# Patient Record
Sex: Male | Born: 1940 | Race: White | Hispanic: No | Marital: Married | Smoking: Former smoker
Health system: Southern US, Community
[De-identification: ages and names within clinical notes are randomized; demographics above are authoritative.]

## PROBLEM LIST (undated history)

## (undated) DIAGNOSIS — E785 Hyperlipidemia, unspecified: Secondary | ICD-10-CM

## (undated) DIAGNOSIS — G479 Sleep disorder, unspecified: Secondary | ICD-10-CM

## (undated) DIAGNOSIS — M1990 Unspecified osteoarthritis, unspecified site: Secondary | ICD-10-CM

## (undated) DIAGNOSIS — C7B8 Other secondary neuroendocrine tumors: Secondary | ICD-10-CM

## (undated) DIAGNOSIS — I10 Essential (primary) hypertension: Secondary | ICD-10-CM

## (undated) DIAGNOSIS — N508 Other specified disorders of male genital organs: Secondary | ICD-10-CM

## (undated) DIAGNOSIS — I519 Heart disease, unspecified: Secondary | ICD-10-CM

## (undated) DIAGNOSIS — F419 Anxiety disorder, unspecified: Secondary | ICD-10-CM

## (undated) DIAGNOSIS — E039 Hypothyroidism, unspecified: Secondary | ICD-10-CM

## (undated) DIAGNOSIS — I499 Cardiac arrhythmia, unspecified: Secondary | ICD-10-CM

## (undated) DIAGNOSIS — K219 Gastro-esophageal reflux disease without esophagitis: Secondary | ICD-10-CM

## (undated) DIAGNOSIS — D126 Benign neoplasm of colon, unspecified: Secondary | ICD-10-CM

## (undated) DIAGNOSIS — I509 Heart failure, unspecified: Secondary | ICD-10-CM

## (undated) DIAGNOSIS — E079 Disorder of thyroid, unspecified: Secondary | ICD-10-CM

## (undated) DIAGNOSIS — C801 Malignant (primary) neoplasm, unspecified: Secondary | ICD-10-CM

## (undated) HISTORY — DX: Other specified disorders of male genital organs: N50.8

## (undated) HISTORY — DX: Heart failure, unspecified: I50.9

## (undated) HISTORY — DX: Sleep disorder, unspecified: G47.9

## (undated) HISTORY — DX: Other secondary neuroendocrine tumors: C7B.8

## (undated) HISTORY — DX: Disorder of thyroid, unspecified: E07.9

## (undated) HISTORY — DX: Anxiety disorder, unspecified: F41.9

## (undated) HISTORY — DX: Heart disease, unspecified: I51.9

## (undated) HISTORY — DX: Gastro-esophageal reflux disease without esophagitis: K21.9

## (undated) HISTORY — DX: Hypothyroidism, unspecified: E03.9

## (undated) HISTORY — DX: Unspecified osteoarthritis, unspecified site: M19.90

## (undated) HISTORY — DX: Hyperlipidemia, unspecified: E78.5

## (undated) HISTORY — DX: Cardiac arrhythmia, unspecified: I49.9

## (undated) HISTORY — DX: Essential (primary) hypertension: I10

## (undated) HISTORY — DX: Benign neoplasm of colon, unspecified: D12.6

## (undated) HISTORY — PX: WRIST SURGERY: SHX841

---
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
## 1995-07-26 DIAGNOSIS — E785 Hyperlipidemia, unspecified: Secondary | ICD-10-CM

## 1995-07-26 HISTORY — DX: Hyperlipidemia, unspecified: E78.5

## 1995-07-26 HISTORY — PX: SPINE SURGERY: SHX786

## 2002-07-25 DIAGNOSIS — D126 Benign neoplasm of colon, unspecified: Secondary | ICD-10-CM

## 2002-07-25 HISTORY — DX: Benign neoplasm of colon, unspecified: D12.6

## 2003-07-26 DIAGNOSIS — E039 Hypothyroidism, unspecified: Secondary | ICD-10-CM

## 2003-07-26 HISTORY — DX: Hypothyroidism, unspecified: E03.9

## 2007-07-26 HISTORY — PX: OTHER SURGICAL HISTORY: SHX169

## 2008-04-04 DIAGNOSIS — D126 Benign neoplasm of colon, unspecified: Secondary | ICD-10-CM | POA: Insufficient documentation

## 2010-01-21 DIAGNOSIS — B009 Herpesviral infection, unspecified: Secondary | ICD-10-CM | POA: Insufficient documentation

## 2010-01-21 DIAGNOSIS — L570 Actinic keratosis: Secondary | ICD-10-CM | POA: Insufficient documentation

## 2012-07-25 HISTORY — PX: INGUINAL HERNIA REPAIR: SUR1180

## 2012-08-25 DIAGNOSIS — N508 Other specified disorders of male genital organs: Secondary | ICD-10-CM

## 2012-08-25 HISTORY — DX: Other specified disorders of male genital organs: N50.8

## 2013-02-13 ENCOUNTER — Encounter: Payer: Self-pay | Admitting: General Surgery

## 2013-02-18 ENCOUNTER — Ambulatory Visit (INDEPENDENT_AMBULATORY_CARE_PROVIDER_SITE_OTHER): Payer: Medicare Other | Admitting: General Surgery

## 2013-02-18 ENCOUNTER — Other Ambulatory Visit: Payer: Self-pay | Admitting: General Surgery

## 2013-02-18 ENCOUNTER — Encounter: Payer: Self-pay | Admitting: General Surgery

## 2013-02-18 VITALS — BP 122/80 | HR 72 | Resp 16 | Ht 68.0 in | Wt 169.0 lb

## 2013-02-18 DIAGNOSIS — K4090 Unilateral inguinal hernia, without obstruction or gangrene, not specified as recurrent: Secondary | ICD-10-CM | POA: Insufficient documentation

## 2013-02-18 NOTE — Patient Instructions (Addendum)

## 2013-02-18 NOTE — Progress Notes (Signed)
Patient ID: Louis Sanchez, male   DOB: 23-Dec-1940, 72 y.o.   MRN: [id number]  No chief complaint on file.   HPI Louis Sanchez is a 72 y.o. male. here today for an right inguinal hernia ,been there for six months and getting bigger. Patient states it is starting to hurt . HPI  Past Medical History  Diagnosis Date  . Thyroid disease   . Hypertension   . Heart disease   . Arthritis   . Other specified disorder of male genital organs(608.89) 214  . Benign neoplasm of colon 2004  . Other and unspecified hyperlipidemia 1997  . Sleep disturbance, unspecified   . Unspecified hypothyroidism 2005    Past Surgical History  Procedure Laterality Date  . Wrist surgery    . Colon polyps    . Appendectomy  1953  . Spine surgery  1997    neck  . Hernia repair  1957    Right    Family History  Problem Relation Age of Onset  . Alzheimer's disease Mother     Social History History  Substance Use Topics  . Smoking status: Former Smoker -- 25 years    Types: Cigarettes  . Smokeless tobacco: Never Used  . Alcohol Use: 7.5 oz/week    15 drink(s) per week    No Known Allergies  Current Outpatient Prescriptions  Medication Sig Dispense Refill  . aspirin 81 MG tablet Take 81 mg by mouth daily.      Marland Kitchen atenolol (TENORMIN) 25 MG tablet Take 0.5 tablets by mouth every evening.      . calcium carbonate (OS-CAL) 600 MG TABS Take 600 mg by mouth 2 (two) times daily with a meal.      . fish oil-omega-3 fatty acids 1000 MG capsule Take 2 g by mouth daily.      Marland Kitchen glucosamine-chondroitin 500-400 MG tablet Take 1 tablet by mouth 3 (three) times daily.      . Multiple Vitamin (MULTIVITAMIN) tablet Take 1 tablet by mouth daily.      . Nutritional Supplements (VITAMIN D MAINTENANCE PO) Take 1 capsule by mouth daily.      . sertraline (ZOLOFT) 100 MG tablet Take 1 tablet by mouth daily.      Marland Kitchen SYNTHROID 50 MCG tablet Take 1 tablet by mouth daily.      . vitamin C (ASCORBIC ACID) 500 MG tablet Take 500 mg  by mouth daily.      . vitamin E 100 UNIT capsule Take 400 Units by mouth daily.       No current facility-administered medications for this visit.    Review of Systems Review of Systems  Constitutional: Negative.   Respiratory: Negative.   Cardiovascular: Negative.   Gastrointestinal: Positive for abdominal pain.    Blood pressure 122/80, pulse 72, resp. rate 16, height 5\' 8"  (1.727 m), weight 169 lb (76.658 kg).  Physical Exam Physical Exam  Constitutional: He is oriented to person, place, and time. He appears well-developed and well-nourished.  Cardiovascular: Normal rate, regular rhythm and normal heart sounds.   Pulmonary/Chest: Breath sounds normal.  Abdominal: A hernia is present. Hernia confirmed positive in the right inguinal area. Hernia confirmed negative in the left inguinal area.    Genitourinary:  Well healed incision in the right groin.  Lymphadenopathy:    He has no cervical adenopathy.  Neurological: He is alert and oriented to person, place, and time.  Skin: Skin is warm and dry.    Data Reviewed Primary M.D.  Office notes.  Laboratory studies January 21, 2013 showed a hemoglobin of 15.7, white blood cell count of 6900 and MCV of 92. Normal hepatic function panel. Normal electrolytes and renal function.  Assessment    Recurrent right inguinal hernia.     Plan      Considering his previous open appendectomy in the distant past I think he'll be best served by a open repair of his recurrent inguinal hernia. The role of prosthetic mesh was discussed.      Patient's surgery has been scheduled for 02-22-13 at [facility]. This patient will decrease to one (1) 81 mg aspirin per day.   Louis Sanchez 02/18/2013, 8:40 PM

## 2013-02-19 ENCOUNTER — Ambulatory Visit: Payer: Self-pay | Admitting: General Surgery

## 2013-02-19 DIAGNOSIS — I499 Cardiac arrhythmia, unspecified: Secondary | ICD-10-CM

## 2013-02-22 ENCOUNTER — Ambulatory Visit: Payer: Self-pay | Admitting: General Surgery

## 2013-02-22 DIAGNOSIS — K4091 Unilateral inguinal hernia, without obstruction or gangrene, recurrent: Secondary | ICD-10-CM

## 2013-02-27 ENCOUNTER — Other Ambulatory Visit: Payer: Self-pay

## 2013-02-27 ENCOUNTER — Encounter: Payer: Self-pay | Admitting: General Surgery

## 2013-03-04 ENCOUNTER — Encounter: Payer: Self-pay | Admitting: General Surgery

## 2013-03-04 ENCOUNTER — Ambulatory Visit (INDEPENDENT_AMBULATORY_CARE_PROVIDER_SITE_OTHER): Payer: Medicare Other | Admitting: General Surgery

## 2013-03-04 VITALS — BP 126/80 | HR 76 | Resp 14 | Ht 68.0 in | Wt 167.0 lb

## 2013-03-04 DIAGNOSIS — K4090 Unilateral inguinal hernia, without obstruction or gangrene, not specified as recurrent: Secondary | ICD-10-CM

## 2013-03-04 NOTE — Patient Instructions (Addendum)
Activity as tolerated, follow instructions regarding lifting techniques. Exercise as instructed.

## 2013-03-04 NOTE — Progress Notes (Signed)
Patient ID: Louis Sanchez, male   DOB: 09-16-1940, 72 y.o.   MRN: [id number] Here for Post op exam from right inguinal hernia repair. Patient states that he is doing very well. He is not taking any pain medications. He states there is no pain. He can feel a slight pulling occasionally with standing.   The patient underwent repair of a recurrent right inguinal hernia making use of a large Ultra pro mesh on February 22, 2013. He did not require much in the way of analgesics. No difficulty with bowel or bladder function. He did report significant bruising involving the penis and scrotum  which has now resolved.  Exam:  Normally healing wound. Moderate healing ridge. No weakness with coughing or straining. Testes are symmetrical in size.    Impression doing well status post inguinal hernia repair.  He may resume activities at the gymnasium, but must limit his exercises to one extremity at that time. No abnormal crunches.    Plan:  Final followup exam in one month.

## 2013-04-09 ENCOUNTER — Ambulatory Visit (INDEPENDENT_AMBULATORY_CARE_PROVIDER_SITE_OTHER): Payer: Medicare Other | Admitting: General Surgery

## 2013-04-09 ENCOUNTER — Encounter: Payer: Self-pay | Admitting: General Surgery

## 2013-04-09 ENCOUNTER — Encounter: Payer: Self-pay | Admitting: *Deleted

## 2013-04-09 VITALS — BP 114/80 | HR 80 | Resp 14 | Ht 68.0 in | Wt 167.0 lb

## 2013-04-09 DIAGNOSIS — K4090 Unilateral inguinal hernia, without obstruction or gangrene, not specified as recurrent: Secondary | ICD-10-CM

## 2013-04-09 NOTE — Progress Notes (Signed)
Patient ID: Louis Sanchez, male   DOB: Sep 02, 1940, 72 y.o.   MRN: [id number]  Colonoscopy instructions were reviewed with the patient. He will be contacted once December 2014 schedule is available (per his request-going on a cruise). Patient already has paperwork. Miralax prescription will be sent in once date arranged. Patient to call the office back with the name and number of physician in [place] who completed last colonoscopy 5 years ago in [place].

## 2013-04-09 NOTE — Progress Notes (Signed)
Patient ID: Louis Sanchez, male   DOB: April 18, 1941, 72 y.o.   MRN: [id number]  Chief Complaint  Patient presents with  . Routine Post Op    right ingunial hernia     HPI Louis Sanchez is a 72 y.o. male here today for his 1 month post op right inguinal hernia done on 02/22/13.Patient states he is doing well. HPI  Past Medical History  Diagnosis Date  . Thyroid disease   . Hypertension   . Heart disease   . Arthritis   . Other specified disorder of male genital organs(608.89) 214  . Benign neoplasm of colon 2004  . Other and unspecified hyperlipidemia 1997  . Sleep disturbance, unspecified   . Unspecified hypothyroidism 2005    Past Surgical History  Procedure Laterality Date  . Wrist surgery    . Colon polyps  2009  . Appendectomy  1953  . Spine surgery  1997    neck  . Hernia repair  1957    Right  . Inguinal hernia repair Right 2014    Family History  Problem Relation Age of Onset  . Alzheimer's disease Mother     Social History History  Substance Use Topics  . Smoking status: Former Smoker -- 25 years    Types: Cigarettes  . Smokeless tobacco: Never Used  . Alcohol Use: 7.5 oz/week    15 drink(s) per week    No Known Allergies  Current Outpatient Prescriptions  Medication Sig Dispense Refill  . aspirin 81 MG tablet Take 81 mg by mouth daily.      Marland Kitchen atenolol (TENORMIN) 25 MG tablet Take 0.5 tablets by mouth every evening.      . calcium carbonate (OS-CAL) 600 MG TABS Take 600 mg by mouth 2 (two) times daily with a meal.      . fish oil-omega-3 fatty acids 1000 MG capsule Take 2 g by mouth daily.      Marland Kitchen glucosamine-chondroitin 500-400 MG tablet Take 1 tablet by mouth 3 (three) times daily.      . Multiple Vitamin (MULTIVITAMIN) tablet Take 1 tablet by mouth daily.      . Nutritional Supplements (VITAMIN D MAINTENANCE PO) Take 1 capsule by mouth daily.      . sertraline (ZOLOFT) 100 MG tablet Take 1 tablet by mouth daily.      Marland Kitchen SYNTHROID 50 MCG tablet Take 1  tablet by mouth daily.      . vitamin C (ASCORBIC ACID) 500 MG tablet Take 500 mg by mouth daily.      . vitamin E 100 UNIT capsule Take 400 Units by mouth daily.       No current facility-administered medications for this visit.    Review of Systems Review of Systems  Constitutional: Negative.   Respiratory: Negative.   Cardiovascular: Negative.     Blood pressure 114/80, pulse 80, resp. rate 14, height 5\' 8"  (1.727 m), weight 167 lb (75.751 kg).  Physical Exam Physical Exam  Constitutional: He is oriented to person, place, and time. He appears well-developed and well-nourished.  Cardiovascular: Normal rate, regular rhythm and normal heart sounds.   Pulmonary/Chest: Breath sounds normal.  Abdominal:  Right inguinal hernia repair well healed .  Neurological: He is alert and oriented to person, place, and time.  Skin: Skin is warm and dry.    Data Reviewed None.  Assessment    Doing well status post recurrent right inguinal hernia repair with Ultrapro mesh. History of colonic polyps discovered at a  colonoscopy completed in [place], [place].    Plan    The patient's interested in having his followup colonoscopy completed locally. He will be asked to sign a record release. There about to embark on a [place]long [place] cruise mid[place]October and wanted to defer colonoscopy until after that trip.       Earline Mayotte 04/09/2013, 9:40 AM

## 2013-04-09 NOTE — Patient Instructions (Signed)
Patient to return as needed. 

## 2013-05-10 ENCOUNTER — Telehealth: Payer: Self-pay | Admitting: *Deleted

## 2013-05-10 DIAGNOSIS — Z1211 Encounter for screening for malignant neoplasm of colon: Secondary | ICD-10-CM

## 2013-05-10 MED ORDER — POLYETHYLENE GLYCOL 3350 17 GM/SCOOP PO POWD: ORAL | Status: DC

## 2013-05-10 NOTE — Telephone Encounter (Signed)
Patient was contacted since we now have December 2014 surgery schedule available. This patient has been scheduled for a colonoscopy on 07-03-13 at [facility]. He was reminded to discontinue fish oil one week prior to procedure and aware it is okay to continue 81 mg aspirin. Patient will be contacted prior to colonoscopy to verify no medication changes. Miralax prescription has been sent in to patient's pharmacy.

## 2013-05-30 ENCOUNTER — Other Ambulatory Visit: Payer: Self-pay

## 2013-06-28 ENCOUNTER — Telehealth: Payer: Self-pay | Admitting: *Deleted

## 2013-06-28 DIAGNOSIS — Z1211 Encounter for screening for malignant neoplasm of colon: Secondary | ICD-10-CM

## 2013-06-28 MED ORDER — POLYETHYLENE GLYCOL 3350 17 GM/SCOOP PO POWD: ORAL | Status: DC

## 2013-06-28 NOTE — Telephone Encounter (Signed)
Patient's wife called back to report that patient got the norovirus while on his cruise (around 06-08-13). Wife states that he has not fully recovered and still feels weak. Patient wishes to reschedule to January 2015.  Colonoscopy has been moved from 07-03-13 to 08-07-13 at [facility]. Trish in endoscopy notified.  Patient's wife also states that pharmacy has not called stating Miralax prescription is ready and wants this to be resent. Also, she reports she does not have colonoscopy instructions and these will be mailed out. She was instructed to make sure patient pre-registers prior to procedure. They will be contacted prior to verify no medication changes. She was also reminded to have patient discontinue fish oil one week prior to procedure.

## 2013-06-28 NOTE — Telephone Encounter (Signed)
Message has been left on patient's home number. We also need to verify patient's cell number.  Patient is scheduled for a colonoscopy on 07-03-13 at [facility]. We need to verify patient has had no change in medications. Patient will also need to be reminded that he should no longer be taking fish oil.

## 2013-07-31 ENCOUNTER — Telehealth: Payer: Self-pay | Admitting: *Deleted

## 2013-07-31 ENCOUNTER — Other Ambulatory Visit: Payer: Self-pay | Admitting: General Surgery

## 2013-07-31 DIAGNOSIS — Z1211 Encounter for screening for malignant neoplasm of colon: Secondary | ICD-10-CM

## 2013-07-31 NOTE — Telephone Encounter (Signed)
Patient was contacted today to verify no medication changes since last office visit and patient confirms. He was instructed to pre-register by Friday since he has not done so already. Patient already has Miralax prescription.   We will proceed with colonoscopy that is scheduled at [facility] for 08-07-13. He was instructed to call if he has further questions.

## 2013-08-05 ENCOUNTER — Encounter: Payer: Self-pay | Admitting: General Surgery

## 2013-08-07 ENCOUNTER — Ambulatory Visit: Payer: Self-pay | Admitting: General Surgery

## 2013-08-07 DIAGNOSIS — D129 Benign neoplasm of anus and anal canal: Secondary | ICD-10-CM

## 2013-08-07 DIAGNOSIS — Z86010 Personal history of colonic polyps: Secondary | ICD-10-CM

## 2013-08-07 DIAGNOSIS — D128 Benign neoplasm of rectum: Secondary | ICD-10-CM

## 2013-08-08 LAB — PATHOLOGY REPORT

## 2013-08-09 ENCOUNTER — Encounter: Payer: Self-pay | Admitting: General Surgery

## 2013-08-14 ENCOUNTER — Telehealth: Payer: Self-pay

## 2013-08-14 ENCOUNTER — Encounter: Payer: Self-pay | Admitting: General Surgery

## 2013-08-14 NOTE — Telephone Encounter (Signed)
Message copied by Lesly Rubenstein on Wed Aug 14, 2013  8:25 AM ------      Message from: Auberry, Peach W      Created: Mon Aug 12, 2013  3:11 PM       Please notify the patient that the polyp removed on the 14th was benign, but he should plan on a repeat exam in five years to be sure more do not develop. ------

## 2013-08-14 NOTE — Telephone Encounter (Signed)
MSG left for patient to call back for pathology results.

## 2013-08-14 NOTE — Telephone Encounter (Signed)
Notified patient as instructed, patient pleased. Discussed follow-up appointments, patient agrees  

## 2014-01-30 ENCOUNTER — Observation Stay: Payer: Self-pay | Admitting: Internal Medicine

## 2014-01-30 LAB — CBC
HCT: 43.9 % (ref 40.0–52.0)
HGB: 14.6 g/dL (ref 13.0–18.0)
MCH: 32.1 pg (ref 26.0–34.0)
MCHC: 33.3 g/dL (ref 32.0–36.0)
MCV: 97 fL (ref 80–100)
PLATELETS: 140 10*3/uL — AB (ref 150–440)
RBC: 4.55 10*6/uL (ref 4.40–5.90)
RDW: 13.5 % (ref 11.5–14.5)
WBC: 7.4 10*3/uL (ref 3.8–10.6)

## 2014-01-30 LAB — COMPREHENSIVE METABOLIC PANEL
ALK PHOS: 75 U/L
ALT: 74 U/L (ref 12–78)
ANION GAP: 8 (ref 7–16)
AST: 58 U/L — AB (ref 15–37)
Albumin: 3.8 g/dL (ref 3.4–5.0)
BUN: 21 mg/dL — AB (ref 7–18)
Bilirubin,Total: 0.6 mg/dL (ref 0.2–1.0)
CALCIUM: 9.2 mg/dL (ref 8.5–10.1)
CREATININE: 1.03 mg/dL (ref 0.60–1.30)
Chloride: 102 mmol/L (ref 98–107)
Co2: 29 mmol/L (ref 21–32)
Glucose: 109 mg/dL — ABNORMAL HIGH (ref 65–99)
Osmolality: 281 (ref 275–301)
POTASSIUM: 4.4 mmol/L (ref 3.5–5.1)
Sodium: 139 mmol/L (ref 136–145)
Total Protein: 7.2 g/dL (ref 6.4–8.2)

## 2014-01-30 LAB — TROPONIN I

## 2014-01-30 LAB — CK TOTAL AND CKMB (NOT AT ARMC)
CK, Total: 53 U/L
CK-MB: 1.3 ng/mL (ref 0.5–3.6)

## 2014-01-30 LAB — CK-MB: CK-MB: 1.5 ng/mL (ref 0.5–3.6)

## 2014-01-31 LAB — LIPID PANEL
CHOLESTEROL: 175 mg/dL (ref 0–200)
HDL Cholesterol: 44 mg/dL (ref 40–60)
Ldl Cholesterol, Calc: 86 mg/dL (ref 0–100)
TRIGLYCERIDES: 227 mg/dL — AB (ref 0–200)
VLDL Cholesterol, Calc: 45 mg/dL — ABNORMAL HIGH (ref 5–40)

## 2014-01-31 LAB — TROPONIN I

## 2014-01-31 LAB — CK-MB: CK-MB: 1.4 ng/mL (ref 0.5–3.6)

## 2014-01-31 LAB — TSH: Thyroid Stimulating Horm: 4.24 u[IU]/mL

## 2014-08-25 DIAGNOSIS — G939 Disorder of brain, unspecified: Secondary | ICD-10-CM | POA: Insufficient documentation

## 2014-08-25 DIAGNOSIS — I6782 Cerebral ischemia: Secondary | ICD-10-CM | POA: Insufficient documentation

## 2014-08-25 DIAGNOSIS — I340 Nonrheumatic mitral (valve) insufficiency: Secondary | ICD-10-CM | POA: Insufficient documentation

## 2014-11-14 NOTE — Op Note (Signed)
PATIENT NAME:  Louis Sanchez, Louis Sanchez MR#:  [id number] DATE OF BIRTH:  20-Mar-1941  DATE OF PROCEDURE:  02/22/2013  PREOPERATIVE DIAGNOSIS: Recurrent right inguinal hernia.   POSTOPERATIVE DIAGNOSIS: Recurrent right inguinal hernia.   OPERATIVE PROCEDURE: Right inguinal hernia repair with Ultrapro mesh.   SURGEON: Hervey Ard, MD   ANESTHESIA: General, Marcaine 0.5% with 1:200,000 units of epinephrine, Toradol 30 mg.   ESTIMATED BLOOD LOSS: Minimal.   CLINICAL NOTE: This 74 year old male has developed a symptomatic right inguinal hernia. He reported a "testicle hernia" in his youth. There is a right lower quadrant inguinal incision evident.   OPERATIVE NOTE: The patient received Kefzol intravenously prior to the procedure. The lower abdomen was prepped with ChloraPrep and draped. Hair had previously been removed with clippers. A 5 cm skin line incision was made about 1.5 cm above the previous incision. This was carried down through the skin and subcutaneous tissue, with hemostasis achieved by electrocautery as well as 3-0 Vicryl ties. There were several prominent veins in the tissue just below the level of the dermis, no other unusual vascularity noted. The external oblique was opened in the direction of its fibers. The cord was mobilized and a moderate-sized indirect sac identified. No direct defect was appreciated. The preperitoneal space was cleared and a large Ultrapro mesh smoothed into position. The patient had some moderate coughing during this time, with no bulge evident. The external component was laid along the floor of the inguinal canal. It was anchored to the pubic tubercle and along the inguinal ligament with interrupted 0 Surgilon sutures. The medial and superior borders were anchored to the transverse abdominis aponeurosis. A lateral slit was made for cord passage. Toradol was placed into the wound. The external oblique was closed with a running 2-0 Vicryl. Scarpa's fascia was closed with a  running 3-0 Vicryl and the skin closed with a running 4-0 Vicryl subcuticular suture. Benzoin, Steri-Strips, Telfa and Tegaderm dressing was then applied.   The patient tolerated the procedure well and was taken to the recovery room in stable condition.    ____________________________ Robert Bellow, MD jwb:jm D: 02/22/2013 18:11:47 ET T: 02/22/2013 18:38:16 ET JOB#: [id number]  cc: Robert Bellow, MD, <Dictator> Dr. Denita Lung Vetra Shinall Amedeo Kinsman MD ELECTRONICALLY SIGNED 02/23/2013 9:30
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
## 2014-11-15 NOTE — Discharge Summary (Signed)
PATIENT NAME:  Louis Sanchez, Louis Sanchez MR#:  [id number] DATE OF BIRTH:  [birth year]  DATE OF ADMISSION:  01/30/2014 DATE OF DISCHARGE:  01/31/2014  DISCHARGE DIAGNOSES:  1.  Transient ischemic attack.  2.  History of cerebrovascular accident.  3.  Paroxysmal atrial fibrillation.  4.  Hyperlipidemia with LDL of 86.   IMAGING STUDIES: Done are: 1.  CT scan of the head without contrast, which showed no acute abnormalities.  2.  MRI of the brain without contrast showed no acute infarction, but did show an old white matter CVA in the posterior frontal region.  3.  Carotid Doppler showed no acute stenosis.  4.  Echocardiogram showed normal ejection fraction with mild aortic regurgitation and mitral valve regurgitation. No clots.   ADMITTING HISTORY AND PHYSICAL: Please see detailed H and P dictated by Dr. Lunette Stands. In brief, a 74 year old male patient with history of paroxysmal atrial fibrillation on aspirin, brought to the hospital complaining of right-sided weakness. The patient also had some  dysarthria, a second episode, but symptoms resolved in the Emergency Room and was admitted to the hospitalist service.   HOSPITAL COURSE: The patient was admitted on the tele floor with neuro checks. Was continued on aspirin, statin and Lovenox for DVT prophylaxis. MRI showed old stroke, nothing acute. He was diagnosed with TIA during this episode, but considering with paroxysmal atrial fibrillation and present stroke, patient was thought to be a candidate for Xarelto and was started on this. The patient will follow up with his cardiologist. He has not had any bleeding. He will be on aspirin, statin, and Xarelto, and also started on Lipitor to keep his LDL less than 70.  At this time, it is 24. Prior to discharge, the patient's neurologic examination shows motor strength 5/5 in upper and lower extremities. Sensation is intact.   DISCHARGE MEDICATIONS:  1.  Xarelto 20 mg oral once a day.  2.  Aspirin 81 mg daily.  3.   Lipitor 20 mg oral daily.  4.  Atenolol 25 mg 1/2 tablet daily.  5.  Calcium carbonate 1 tablet daily.  6.  Chondroitin glucosamine 1 tablet 2 times a day. 7.  Fish oil oral capsule once a day.  8.  Multivitamin 1 tablet daily.  9.  Sertraline 100 mg daily.  10.  Synthroid 50 mcg daily.  11.  Vitamin B complex 100 one tablet daily.  12.  Vitamin C 1000 mg oral daily.  13.  Vitamin E 400 international units oral capsule once a day.   DISCHARGE INSTRUCTIONS: Cardiac diet. Activity as tolerated. Follow up with cardiology and primary care physician in a week.   ____________________________ Leia Alf. Rashaan Wyles, MD srs:ts D: 02/04/2014 18:55:38 ET T: 02/04/2014 19:02:51 ET JOB#: [id number]  cc: Alveta Heimlich R. Antonetta Clanton, MD, <Dictator> [facility], Cardiology Department Neita Carp MD ELECTRONICALLY SIGNED 02/12/2014 17:20

## 2015-11-24 DIAGNOSIS — Z79899 Other long term (current) drug therapy: Secondary | ICD-10-CM | POA: Insufficient documentation

## 2015-11-24 DIAGNOSIS — E039 Hypothyroidism, unspecified: Secondary | ICD-10-CM | POA: Insufficient documentation

## 2015-11-24 DIAGNOSIS — Z7982 Long term (current) use of aspirin: Secondary | ICD-10-CM | POA: Diagnosis not present

## 2015-11-24 DIAGNOSIS — I83891 Varicose veins of right lower extremities with other complications: Secondary | ICD-10-CM | POA: Insufficient documentation

## 2015-11-24 DIAGNOSIS — I10 Essential (primary) hypertension: Secondary | ICD-10-CM | POA: Insufficient documentation

## 2015-11-24 DIAGNOSIS — E785 Hyperlipidemia, unspecified: Secondary | ICD-10-CM | POA: Diagnosis not present

## 2015-11-24 DIAGNOSIS — Z87891 Personal history of nicotine dependence: Secondary | ICD-10-CM | POA: Diagnosis not present

## 2015-11-24 NOTE — ED Notes (Addendum)
Pt states he had a little varicose vein in right ankle start to bleed and could not get it to stop, pt is on xarelto.

## 2015-11-25 ENCOUNTER — Emergency Department
Admission: EM | Admit: 2015-11-25 | Discharge: 2015-11-25 | Disposition: A | Discharge status: Home / Self Care | Payer: Medicare Other | Source: Home / Self Care | Attending: Emergency Medicine | Admitting: Emergency Medicine

## 2015-11-25 ENCOUNTER — Emergency Department
Admission: EM | Admit: 2015-11-25 | Discharge: 2015-11-25 | Disposition: A | Discharge status: Home / Self Care | Payer: Medicare Other | Attending: Emergency Medicine | Admitting: Emergency Medicine

## 2015-11-25 DIAGNOSIS — I8392 Asymptomatic varicose veins of left lower extremity: Secondary | ICD-10-CM | POA: Insufficient documentation

## 2015-11-25 DIAGNOSIS — E785 Hyperlipidemia, unspecified: Secondary | ICD-10-CM

## 2015-11-25 DIAGNOSIS — Z79899 Other long term (current) drug therapy: Secondary | ICD-10-CM

## 2015-11-25 DIAGNOSIS — Z7982 Long term (current) use of aspirin: Secondary | ICD-10-CM | POA: Insufficient documentation

## 2015-11-25 DIAGNOSIS — Z87891 Personal history of nicotine dependence: Secondary | ICD-10-CM

## 2015-11-25 DIAGNOSIS — Z85038 Personal history of other malignant neoplasm of large intestine: Secondary | ICD-10-CM

## 2015-11-25 DIAGNOSIS — I83892 Varicose veins of left lower extremities with other complications: Secondary | ICD-10-CM

## 2015-11-25 DIAGNOSIS — I10 Essential (primary) hypertension: Secondary | ICD-10-CM

## 2015-11-25 DIAGNOSIS — I83891 Varicose veins of right lower extremities with other complications: Secondary | ICD-10-CM

## 2015-11-25 DIAGNOSIS — M1990 Unspecified osteoarthritis, unspecified site: Secondary | ICD-10-CM | POA: Insufficient documentation

## 2015-11-25 DIAGNOSIS — E039 Hypothyroidism, unspecified: Secondary | ICD-10-CM | POA: Insufficient documentation

## 2015-11-25 NOTE — ED Provider Notes (Signed)
[facility] Emergency Department Provider Note  ____________________________________________  Time seen: 2:00 AM  I have reviewed the triage vital signs and the nursing notes.   HISTORY  Chief Complaint Wound Check      HPI Louis Sanchez is a 75 y.o. male with history of DVT currently taking Zarontin presents with "a small varicose vein in his right ankle burst while he was taking a shower with considerable bleeding at the patient was unable to stop. Patient states that EMS arrived and was unable to control the bleeding as well which is altered and the patient presented to the emergency department.     Past Medical History  Diagnosis Date  . Thyroid disease   . Hypertension   . Heart disease   . Arthritis   . Other specified disorder of male genital organs(608.89) 214  . Benign neoplasm of colon 2004  . Other and unspecified hyperlipidemia 1997  . Sleep disturbance, unspecified   . Unspecified hypothyroidism 2005  . 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 Inguinal hernia 02/18/2013  . Benign neoplasm of colon 04/04/2008    Past Surgical History  Procedure Laterality Date  . Wrist surgery    . Colon polyps  2009  . Appendectomy  1953  . Spine surgery  1997    neck  . Hernia repair  1957    Right  . Inguinal hernia repair Right 2014    Current Outpatient Rx  Name  Route  Sig  Dispense  Refill  . aspirin 81 MG tablet   Oral   Take 81 mg by mouth daily.         .Marland Kitchenatenolol (TENORMIN) 25 MG tablet   Oral   Take 0.5 tablets by mouth every evening.         . calcium carbonate (OS-CAL) 600 MG TABS   Oral   Take 600 mg by mouth 2 (two) times daily with a meal.         . fish oil-omega-3 fatty acids 1000 MG capsule   Oral   Take 2 g by mouth daily.         .Marland Kitchenglucosamine-chondroitin 500-400 MG tablet   Oral   Take 1 tablet by  mouth 3 (three) times daily.         . Multiple Vitamin (MULTIVITAMIN) tablet   Oral   Take 1 tablet by mouth daily.         . Nutritional Supplements (VITAMIN D MAINTENANCE PO)   Oral   Take 1 capsule by mouth daily.         . polyethylene glycol powder (GLYCOLAX/MIRALAX) powder      255 grams one bottle for colonoscopy prep   255 g   0   . polyethylene glycol powder (GLYCOLAX/MIRALAX) powder      255 grams one bottle for colonoscopy prep   255 g   0   . sertraline (ZOLOFT) 100 MG tablet   Oral   Take 1 tablet by mouth daily.         .Marland KitchenSYNTHROID 50 MCG tablet   Oral   Take 1 tablet by mouth daily.         . vitamin C (ASCORBIC ACID) 500 MG tablet   Oral   Take 500 mg by mouth daily.         .Marland Kitchen  vitamin E 100 UNIT capsule   Oral   Take 400 Units by mouth daily.           Allergies No known drug allergies  Family History  Problem Relation Age of Onset  . Alzheimer's disease Mother     Social History Social History  Substance Use Topics  . Smoking status: Former Smoker -- 25 years    Types: Cigarettes  . Smokeless tobacco: Never Used  . Alcohol Use: 7.5 oz/week    15 drink(s) per week    Review of Systems  Constitutional: Negative for fever. Eyes: Negative for visual changes. ENT: Negative for sore throat. Cardiovascular: Negative for chest pain. Respiratory: Negative for shortness of breath. Gastrointestinal: Negative for abdominal pain, vomiting and diarrhea. Genitourinary: Negative for dysuria. Musculoskeletal: Negative for back pain. Skin: Negative for rash. Right lower extremity.Varicose vein bleeding Neurological: Negative for headaches, focal weakness or numbness.   10-point ROS otherwise negative.  ____________________________________________   PHYSICAL EXAM:  VITAL SIGNS: ED Triage Vitals  Enc Vitals Group     BP 11/24/15 2314 135/89 mmHg     Pulse Rate 11/24/15 2314 91     Resp 11/24/15 2314 18     Temp 11/24/15  2314 97.6 F (36.4 C)     Temp Source 11/24/15 2314 Oral     SpO2 11/24/15 2314 97 %     Weight 11/24/15 2314 160 lb (72.576 kg)     Height 11/24/15 2314 '5\' 9"'$  (1.753 m)     Head Cir --      Peak Flow --      Pain Score --      Pain Loc --      Pain Edu? --      Excl. in [place]? --      Constitutional: Alert and oriented. Well appearing and in no distress. Eyes: Conjunctivae are normal. PERRL. Normal extraocular movements. ENT   Head: Normocephalic and atraumatic.   Nose: No congestion/rhinnorhea.   Mouth/Throat: Mucous membranes are moist.   Neck: No stridor. Genitourinary: deferred Musculoskeletal: Nontender with normal range of motion in all extremities. No joint effusions.  No lower extremity tenderness nor edema. Neurologic:  Normal speech and language. No gross focal neurologic deficits are appreciated. Speech is normal.  Skin:  Skin is warm, dry and intact. No rash noted.No active bleeding noted at this time from the patient's varicose veins on his right lower extremity however stable clot in place  Psychiatric: Mood and affect are normal. Speech and behavior are normal. Patient exhibits appropriate insight and judgment.      INITIAL IMPRESSION / ASSESSMENT AND PLAN / ED COURSE  Pertinent labs & imaging results that were available during my care of the patient were reviewed by me and considered in my medical decision making (see chart for details).  Surgicel applied over the area where the patient was bleeding for gauze applied. Counseled patient and his wife to return to the emergency department if bleeding were to recur. At the patient's request she will be referred to Dr. dew for evaluation for varicose vein surgery.  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

## 2015-11-25 NOTE — ED Notes (Signed)
Pt was here last night for bleeding varicose vein and by the time he was seen bleeding was controlled, when he removed dressing tonight it started bleeding again.

## 2015-11-25 NOTE — ED Notes (Addendum)
Pt in via triage with complaints of bleeding varicose vein.  Pt was seen in ED last night but bleeding controlled at the time.  Pt reports when he took dressing off tonight, dressing was saturated and site was "squirting blood" again.  Pt reports taking xarelto.  Pt in no immediate distress at this time.

## 2015-11-25 NOTE — Discharge Instructions (Signed)
Bleeding Varicose Veins Varicose veins are veins that have become enlarged and twisted. Valves in the veins help return blood from the leg to the heart. If these valves are damaged, blood flows backward and backs up into the veins in the leg near the skin. This causes the veins to become larger because of increased pressure within them. Sometimes these veins bleed. CAUSES  Factors that can lead to bleeding varicose veins include:  Thinning of the skin that covers the veins. This skin is stretched as the veins enlarge.  Weak and thinning walls of the varicose veins. These thin walls are part of the reason why blood is not flowing normally to the heart.  Having high pressure in the veins. This high pressure occurs because the blood is not flowing freely back up to the heart.  Injury. Even a small injury to a varicose vein can cause bleeding.  Open wounds. A sore may develop near a varicose vein and not heal. This makes bleeding more likely.  Taking medicine that thins the blood. These medicines may include aspirin, anti-inflammatory medicine, and other blood thinners. SIGNS AND SYMPTOMS  If bleeding is on the outside surface of the skin, blood can be seen. Sometimes, the bleeding stays under the skin. If this happens, the blue or purple area will spread beyond the vein. This discoloration may be visible. DIAGNOSIS  To decide if you have a bleeding varicose vein, your health care provider may:  Ask about your symptoms. This will include when you first saw bleeding.  Ask about how long you have had varicose veins and if they cause you problems.  Ask about your overall health.  Ask about possible causes, such as recent cuts or if the area near the varicose veins was bumped or injured.  Examine the skin or leg that concerns you. Your health care provider will probably feel the veins.  Order imaging tests. These create detailed pictures of the veins. TREATMENT  The first goal of treating  bleeding varicose veins is to stop the bleeding. Then, the aim is to keep any bleeding from happening again. Treatment will depend on the cause of the bleeding and how bad it is. Ask your health care provider about what would be best for you. Options include:  Raising (elevating) your leg. Lie down with your leg propped up on a pillow or cushion. Your foot should be above the level of your heart.  Applying pressure to the spot that is bleeding. The bleeding should stop in a short time.  Wearing elastic stockings that "compress" your legs (compression stockings). An elastic bandage may do the same thing.  Applying an antibiotic cream on sores that are not healing.  Closing off or surgically removing the bleeding varicose veins with one of the following:  Sclerotherapy. A solution is injected into the vein to close it off.  Laser treatment. A laser is used to heat the vein to close it off.  Radiofrequency vein ablation. An electrical current produced by radio waves is used to close off the vein.  Phlebectomy. The vein is surgically removed through small incisions made over the varicose vein.  Vein ligation and stripping. The vein is surgically removed through incisions made over the varicose vein after the vein has been tied (ligated). HOME CARE INSTRUCTIONS   Apply any creams that your health care provider prescribed. Follow the directions carefully.  Wear compression stockings or any wraps as directed by your health care provider. Make sure you know:  If   you should wear them every day.  How long you should wear them.  If veins were removed or closed, a bandage (dressing) will probably cover the area. Make sure you know:  How often the dressing should be changed.  Whether the area can get wet.  When you can leave the skin uncovered.  Check your skin every day. Look for new sores and signs of bleeding.  To prevent future bleeding:  Use extra care in situations where you could  cut your legs, such as when shaving or gardening.  Try to keep your legs elevated as much as possible. Lie down when you can. SEEK MEDICAL CARE IF:   Your veins continue to bleed.  You develop new sores near your varicose veins.  You have a sore that does not heal or gets bigger.  You have increased pain in your leg.  The area around a varicose vein becomes warm, red, or tender to the touch.  You notice a yellowish fluid that smells bad coming from a spot where there was bleeding.  You have a fever. SEEK IMMEDIATE MEDICAL CARE IF:   You have chest pain or difficulty breathing.  You have severe leg pain.   This information is not intended to replace advice given to you by your health care provider. Make sure you discuss any questions you have with your health care provider.   Document Released: 11/27/2008 Document Revised: 08/01/2014 Document Reviewed: 11/12/2013 Elsevier Interactive Patient Education 2016 [employer]  

## 2015-11-25 NOTE — Discharge Instructions (Signed)
Varicose Veins Varicose veins are veins that have become enlarged and twisted. They are usually seen in the legs but can occur in other parts of the body as well. CAUSES This condition is the result of valves in the veins not working properly. Valves in the veins help to return blood from the leg to the heart. If these valves are damaged, blood flows backward and backs up into the veins in the leg near the skin. This causes the veins to become larger. RISK FACTORS People who are on their feet a lot, who are pregnant, or who are overweight are more likely to develop varicose veins. SIGNS AND SYMPTOMS  Bulging, twisted-appearing, bluish veins, most commonly found on the legs.  Leg pain or a feeling of heaviness. These symptoms may be worse at the end of the day.  Leg swelling.  Changes in skin color. DIAGNOSIS A health care provider can usually diagnose varicose veins by examining your legs. Your health care provider may also recommend an ultrasound of your leg veins. TREATMENT Most varicose veins can be treated at home.However, other treatments are available for people who have persistent symptoms or want to improve the cosmetic appearance of the varicose veins. These treatment options include:  Sclerotherapy. A solution is injected into the vein to close it off.  Laser treatment. A laser is used to heat the vein to close it off.  Radiofrequency vein ablation. An electrical current produced by radio waves is used to close off the vein.  Phlebectomy. The vein is surgically removed through small incisions made over the varicose vein.  Vein ligation and stripping. The vein is surgically removed through incisions made over the varicose vein after the vein has been tied (ligated). HOME CARE INSTRUCTIONS  Do not stand or sit in one position for long periods of time. Do not sit with your legs crossed. Rest with your legs raised during the day.  Wear compression stockings as directed by your  health care provider. These stockings help to prevent blood clots and reduce swelling in your legs.  Do not wear other tight, encircling garments around your legs, pelvis, or waist.  Walk as much as possible to increase blood flow.  Raise the foot of your bed at night with 2-inch blocks.  If you get a cut in the skin over the vein and the vein bleeds, lie down with your leg raised and press on it with a clean cloth until the bleeding stops. Then place a bandage (dressing) on the cut. See your health care provider if it continues to bleed. SEEK MEDICAL CARE IF:  The skin around your ankle starts to break down.  You have pain, redness, tenderness, or hard swelling in your leg over a vein.  You are uncomfortable because of leg pain.   This information is not intended to replace advice given to you by your health care provider. Make sure you discuss any questions you have with your health care provider.   Document Released: 04/20/2005 Document Revised: 08/01/2014 Document Reviewed: 11/26/2013 Elsevier Interactive Patient Education 2016 Elsevier Inc.  

## 2015-11-25 NOTE — ED Provider Notes (Signed)
[facility] Emergency Department Provider Note ____________________________________________  Time seen: Approximately 10:40 PM  I have reviewed the triage vital signs and the nursing notes.   HISTORY  Chief Complaint Wound Check    HPI Louis Sanchez is a 76 y.o. male who presents to the emergency department for evaluation of bleeding to the left lower extremity. Patient takes Xarelto. He was evaluated here last night for same, but the bleeding was stopped after applying Surgicel and a pressure dressing. Sclerae states that tonight she began to take the bandage off as advised and the Surgicel came off which caused the bleeding to start over again. They state that the bleeding is a stream of blood, not spurting but is uncontrollable when it starts.   Past Medical History  Diagnosis Date  . Thyroid disease   . Hypertension   . Heart disease   . Arthritis   . Other specified disorder of male genital organs(608.89) 214  . Benign neoplasm of colon 2004  . Other and unspecified hyperlipidemia 1997  . Sleep disturbance, unspecified   . Unspecified hypothyroidism 2005  . Benign neoplasm of colon 04/04/2008    8m tubular adenoma of the ascending colon    Patient Active Problem List   Diagnosis Date Noted  . Inguinal hernia 02/18/2013  . Benign neoplasm of colon 04/04/2008    Past Surgical History  Procedure Laterality Date  . Wrist surgery    . Colon polyps  2009  . Appendectomy  1953  . Spine surgery  1997    neck  . Hernia repair  1957    Right  . Inguinal hernia repair Right 2014    Current Outpatient Rx  Name  Route  Sig  Dispense  Refill  . aspirin 81 MG tablet   Oral   Take 81 mg by mouth daily.         .Marland Kitchenatenolol (TENORMIN) 25 MG tablet   Oral   Take 0.5 tablets by mouth every evening.         . calcium carbonate (OS-CAL) 600 MG TABS   Oral   Take 600 mg by mouth 2 (two) times daily with a meal.         . fish oil-omega-3  fatty acids 1000 MG capsule   Oral   Take 2 g by mouth daily.         .Marland Kitchenglucosamine-chondroitin 500-400 MG tablet   Oral   Take 1 tablet by mouth 3 (three) times daily.         . Multiple Vitamin (MULTIVITAMIN) tablet   Oral   Take 1 tablet by mouth daily.         . Nutritional Supplements (VITAMIN D MAINTENANCE PO)   Oral   Take 1 capsule by mouth daily.         . polyethylene glycol powder (GLYCOLAX/MIRALAX) powder      255 grams one bottle for colonoscopy prep   255 g   0   . polyethylene glycol powder (GLYCOLAX/MIRALAX) powder      255 grams one bottle for colonoscopy prep   255 g   0   . sertraline (ZOLOFT) 100 MG tablet   Oral   Take 1 tablet by mouth daily.         .Marland KitchenSYNTHROID 50 MCG tablet   Oral   Take 1 tablet by mouth daily.         . vitamin C (ASCORBIC ACID) 500 MG tablet  Oral   Take 500 mg by mouth daily.         . vitamin E 100 UNIT capsule   Oral   Take 400 Units by mouth daily.           Allergies Review of patient's allergies indicates no known allergies.  Family History  Problem Relation Age of Onset  . Alzheimer's disease Mother     Social History Social History  Substance Use Topics  . Smoking status: Former Smoker -- 25 years    Types: Cigarettes  . Smokeless tobacco: Never Used  . Alcohol Use: 7.5 oz/week    15 drink(s) per week    Review of Systems   Constitutional: No fever/chills Musculoskeletal: Negative for pain. Skin: Positive for bleeding there could've stain on the left lower extremity Neurological: Negative for headaches, focal weakness or numbness.  ____________________________________________   PHYSICAL EXAM:  VITAL SIGNS: ED Triage Vitals  Enc Vitals Group     BP 11/25/15 2101 124/71 mmHg     Pulse Rate 11/25/15 2101 91     Resp 11/25/15 2101 18     Temp 11/25/15 2101 98.1 F (36.7 C)     Temp Source 11/25/15 2101 Oral     SpO2 11/25/15 2101 96 %     Weight 11/25/15 2101 160 lb  (72.576 kg)     Height 11/25/15 2101 '5\' 9"'$  (1.753 m)     Head Cir --      Peak Flow --      Pain Score 11/25/15 2313 0     Pain Loc --      Pain Edu? --      Excl. in [place]? --     Constitutional: Alert and oriented. Well appearing and in no acute distress. Musculoskeletal: No lower extremity tenderness nor edema.  No joint effusions. FROM throughout. Neurologic:  Normal speech and language. No gross focal neurologic deficits are appreciated. Speech is normal. No gait instability. Skin:  Oozing pinpoint Varicose vein to the medial aspect of the left lower extremity; no continual active bleeding present after removing the bandage applied at home. Psychiatric: Mood and affect are normal. Speech and behavior are normal.  ____________________________________________   LABS (all labs ordered are listed, but only abnormal results are displayed)  Labs Reviewed - No data to display ____________________________________________  EKG   ____________________________________________  RADIOLOGY   ____________________________________________   PROCEDURES  Procedure(s) performed:   Oozing area was cauterized with silver nitrate and hemostasis was obtained. A drop of skin adhesive was also applied over the area. Just prior to leaving there was a bulky bandage applied ____________________________________________   INITIAL IMPRESSION / ASSESSMENT AND PLAN / ED COURSE  Pertinent labs & imaging results that were available during my care of the patient were reviewed by me and considered in my medical decision making (see chart for details).  Patient and wife were given home care instructions. They were also given a referral to see a vascular specialist. They were instructed to return if the bleeding stops and they are again unable to get it under control. There were advised to leave the pressure dressing in place for the next 24 hours. 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

## 2015-11-25 NOTE — ED Notes (Signed)
RN wrapped pt RLE with surgoseal and curlex.

## 2016-01-25 DIAGNOSIS — I83893 Varicose veins of bilateral lower extremities with other complications: Secondary | ICD-10-CM | POA: Insufficient documentation

## 2016-01-25 DIAGNOSIS — I83899 Varicose veins of unspecified lower extremities with other complications: Secondary | ICD-10-CM | POA: Insufficient documentation

## 2016-01-25 DIAGNOSIS — I8390 Asymptomatic varicose veins of unspecified lower extremity: Secondary | ICD-10-CM

## 2016-05-03 DIAGNOSIS — I482 Chronic atrial fibrillation, unspecified: Secondary | ICD-10-CM | POA: Insufficient documentation

## 2016-05-23 DIAGNOSIS — Z8639 Personal history of other endocrine, nutritional and metabolic disease: Secondary | ICD-10-CM | POA: Insufficient documentation

## 2016-05-23 DIAGNOSIS — K921 Melena: Secondary | ICD-10-CM | POA: Insufficient documentation

## 2016-05-23 DIAGNOSIS — K922 Gastrointestinal hemorrhage, unspecified: Secondary | ICD-10-CM | POA: Insufficient documentation

## 2016-05-23 DIAGNOSIS — F419 Anxiety disorder, unspecified: Secondary | ICD-10-CM | POA: Insufficient documentation

## 2016-09-11 ENCOUNTER — Emergency Department: Payer: Medicare Other

## 2016-09-11 ENCOUNTER — Inpatient Hospital Stay: Payer: Medicare Other

## 2016-09-11 ENCOUNTER — Encounter: Payer: Self-pay | Admitting: Emergency Medicine

## 2016-09-11 ENCOUNTER — Inpatient Hospital Stay
Admission: EM | Admit: 2016-09-11 | Discharge: 2016-09-13 | DRG: 309 | Disposition: A | Discharge status: Home / Self Care | Payer: Medicare Other | Attending: Internal Medicine | Admitting: Internal Medicine

## 2016-09-11 DIAGNOSIS — E785 Hyperlipidemia, unspecified: Secondary | ICD-10-CM | POA: Diagnosis present

## 2016-09-11 DIAGNOSIS — Z79899 Other long term (current) drug therapy: Secondary | ICD-10-CM | POA: Diagnosis not present

## 2016-09-11 DIAGNOSIS — R945 Abnormal results of liver function studies: Secondary | ICD-10-CM

## 2016-09-11 DIAGNOSIS — I10 Essential (primary) hypertension: Secondary | ICD-10-CM | POA: Diagnosis present

## 2016-09-11 DIAGNOSIS — I4891 Unspecified atrial fibrillation: Secondary | ICD-10-CM | POA: Diagnosis present

## 2016-09-11 DIAGNOSIS — R7989 Other specified abnormal findings of blood chemistry: Secondary | ICD-10-CM | POA: Diagnosis present

## 2016-09-11 DIAGNOSIS — R6510 Systemic inflammatory response syndrome (SIRS) of non-infectious origin without acute organ dysfunction: Secondary | ICD-10-CM | POA: Diagnosis present

## 2016-09-11 DIAGNOSIS — D692 Other nonthrombocytopenic purpura: Secondary | ICD-10-CM | POA: Diagnosis present

## 2016-09-11 DIAGNOSIS — Z981 Arthrodesis status: Secondary | ICD-10-CM | POA: Diagnosis not present

## 2016-09-11 DIAGNOSIS — K2920 Alcoholic gastritis without bleeding: Secondary | ICD-10-CM | POA: Insufficient documentation

## 2016-09-11 DIAGNOSIS — I872 Venous insufficiency (chronic) (peripheral): Secondary | ICD-10-CM | POA: Diagnosis present

## 2016-09-11 DIAGNOSIS — F1010 Alcohol abuse, uncomplicated: Secondary | ICD-10-CM | POA: Diagnosis present

## 2016-09-11 DIAGNOSIS — Z86010 Personal history of colonic polyps: Secondary | ICD-10-CM | POA: Diagnosis not present

## 2016-09-11 DIAGNOSIS — D72829 Elevated white blood cell count, unspecified: Secondary | ICD-10-CM

## 2016-09-11 DIAGNOSIS — Z87891 Personal history of nicotine dependence: Secondary | ICD-10-CM | POA: Diagnosis not present

## 2016-09-11 DIAGNOSIS — E039 Hypothyroidism, unspecified: Secondary | ICD-10-CM | POA: Diagnosis present

## 2016-09-11 DIAGNOSIS — M1990 Unspecified osteoarthritis, unspecified site: Secondary | ICD-10-CM | POA: Diagnosis present

## 2016-09-11 DIAGNOSIS — R740 Nonspecific elevation of levels of transaminase and lactic acid dehydrogenase [LDH]: Secondary | ICD-10-CM

## 2016-09-11 DIAGNOSIS — D696 Thrombocytopenia, unspecified: Secondary | ICD-10-CM | POA: Diagnosis present

## 2016-09-11 DIAGNOSIS — Z7901 Long term (current) use of anticoagulants: Secondary | ICD-10-CM

## 2016-09-11 DIAGNOSIS — R0600 Dyspnea, unspecified: Secondary | ICD-10-CM

## 2016-09-11 DIAGNOSIS — I482 Chronic atrial fibrillation: Principal | ICD-10-CM | POA: Diagnosis present

## 2016-09-11 DIAGNOSIS — J209 Acute bronchitis, unspecified: Secondary | ICD-10-CM | POA: Diagnosis present

## 2016-09-11 DIAGNOSIS — L239 Allergic contact dermatitis, unspecified cause: Secondary | ICD-10-CM | POA: Diagnosis present

## 2016-09-11 DIAGNOSIS — R7401 Elevation of levels of liver transaminase levels: Secondary | ICD-10-CM

## 2016-09-11 DIAGNOSIS — R161 Splenomegaly, not elsewhere classified: Secondary | ICD-10-CM | POA: Diagnosis present

## 2016-09-11 DIAGNOSIS — R609 Edema, unspecified: Secondary | ICD-10-CM

## 2016-09-11 DIAGNOSIS — Z820 Family history of epilepsy and other diseases of the nervous system: Secondary | ICD-10-CM

## 2016-09-11 DIAGNOSIS — G9340 Encephalopathy, unspecified: Secondary | ICD-10-CM

## 2016-09-11 DIAGNOSIS — R110 Nausea: Secondary | ICD-10-CM | POA: Diagnosis not present

## 2016-09-11 DIAGNOSIS — R160 Hepatomegaly, not elsewhere classified: Secondary | ICD-10-CM

## 2016-09-11 LAB — TROPONIN I

## 2016-09-11 LAB — BASIC METABOLIC PANEL
Anion gap: 8 (ref 5–15)
BUN: 17 mg/dL (ref 6–20)
CHLORIDE: 102 mmol/L (ref 101–111)
CO2: 27 mmol/L (ref 22–32)
CREATININE: 0.75 mg/dL (ref 0.61–1.24)
Calcium: 8.8 mg/dL — ABNORMAL LOW (ref 8.9–10.3)
GFR calc Af Amer: >60 mL/min (ref >60)
GFR calc non Af Amer: >60 mL/min (ref >60)
GLUCOSE: 101 mg/dL — AB (ref 65–99)
POTASSIUM: 4 mmol/L (ref 3.5–5.1)
Sodium: 137 mmol/L (ref 135–145)

## 2016-09-11 LAB — INFLUENZA PANEL BY PCR (TYPE A & B)
Influenza A By PCR: NEGATIVE
Influenza B By PCR: NEGATIVE

## 2016-09-11 LAB — CBC
HEMATOCRIT: 42 % (ref 40.0–52.0)
Hemoglobin: 13.9 g/dL (ref 13.0–18.0)
MCH: 30.1 pg (ref 26.0–34.0)
MCHC: 33 g/dL (ref 32.0–36.0)
MCV: 91.2 fL (ref 80.0–100.0)
PLATELETS: 156 10*3/uL (ref 150–440)
RBC: 4.61 MIL/uL (ref 4.40–5.90)
RDW: 14.7 % — AB (ref 11.5–14.5)
WBC: 12.3 10*3/uL — AB (ref 3.8–10.6)

## 2016-09-11 LAB — URINALYSIS, COMPLETE (UACMP) WITH MICROSCOPIC
BACTERIA UA: NONE SEEN
BILIRUBIN URINE: NEGATIVE
Glucose, UA: NEGATIVE mg/dL
Hgb urine dipstick: NEGATIVE
KETONES UR: NEGATIVE mg/dL
LEUKOCYTES UA: NEGATIVE
Nitrite: NEGATIVE
Protein, ur: NEGATIVE mg/dL
RBC / HPF: NONE SEEN RBC/hpf (ref 0–5)
SPECIFIC GRAVITY, URINE: 1.021 (ref 1.005–1.030)
pH: 5 (ref 5.0–8.0)

## 2016-09-11 LAB — HEPATIC FUNCTION PANEL
ALT: 148 U/L — AB (ref 17–63)
AST: 69 U/L — ABNORMAL HIGH (ref 15–41)
Albumin: 3.3 g/dL — ABNORMAL LOW (ref 3.5–5.0)
Alkaline Phosphatase: 346 U/L — ABNORMAL HIGH (ref 38–126)
BILIRUBIN DIRECT: 0.3 mg/dL (ref 0.1–0.5)
BILIRUBIN INDIRECT: 0.4 mg/dL (ref 0.3–0.9)
BILIRUBIN TOTAL: 0.7 mg/dL (ref 0.3–1.2)
Total Protein: 7 g/dL (ref 6.5–8.1)

## 2016-09-11 LAB — PROTIME-INR
INR: 1.52
Prothrombin Time: 18.5 seconds — ABNORMAL HIGH (ref 11.4–15.2)

## 2016-09-11 LAB — CK: Total CK: 33 U/L — ABNORMAL LOW (ref 49–397)

## 2016-09-11 LAB — BRAIN NATRIURETIC PEPTIDE: B NATRIURETIC PEPTIDE 5: 367 pg/mL — AB (ref 0.0–100.0)

## 2016-09-11 LAB — LACTIC ACID, PLASMA: LACTIC ACID, VENOUS: 1.1 mmol/L (ref 0.5–1.9)

## 2016-09-11 MED ORDER — SERTRALINE HCL 100 MG PO TABS
100.0000 mg | ORAL_TABLET | Freq: Every day | ORAL | Status: DC
  Administered 2016-09-12 – 2016-09-13 (×2): 100 mg via ORAL
  Filled 2016-09-11 (×2): qty 1

## 2016-09-11 MED ORDER — CALCIUM CARBONATE ANTACID 500 MG PO CHEW
600.0000 mg | CHEWABLE_TABLET | Freq: Two times a day (BID) | ORAL | Status: DC
  Administered 2016-09-12 – 2016-09-13 (×3): 600 mg via ORAL
  Filled 2016-09-11 (×3): qty 3

## 2016-09-11 MED ORDER — OMEGA-3-ACID ETHYL ESTERS 1 G PO CAPS
2.0000 g | ORAL_CAPSULE | Freq: Every day | ORAL | Status: DC
  Administered 2016-09-12 – 2016-09-13 (×2): 2 g via ORAL
  Filled 2016-09-11 (×3): qty 2

## 2016-09-11 MED ORDER — ACETAMINOPHEN 325 MG PO TABS: 650.0000 mg | ORAL_TABLET | Freq: Four times a day (QID) | ORAL | Status: DC | PRN

## 2016-09-11 MED ORDER — SODIUM CHLORIDE 0.9 % IV BOLUS (SEPSIS)
1000.0000 mL | Freq: Once | INTRAVENOUS | Status: AC
  Administered 2016-09-11: 1000 mL via INTRAVENOUS

## 2016-09-11 MED ORDER — IPRATROPIUM-ALBUTEROL 0.5-2.5 (3) MG/3ML IN SOLN
3.0000 mL | Freq: Once | RESPIRATORY_TRACT | Status: AC
  Administered 2016-09-11: 3 mL via RESPIRATORY_TRACT
  Filled 2016-09-11: qty 3

## 2016-09-11 MED ORDER — BISACODYL 5 MG PO TBEC: 5.0000 mg | DELAYED_RELEASE_TABLET | Freq: Every day | ORAL | Status: DC | PRN

## 2016-09-11 MED ORDER — IOPAMIDOL (ISOVUE-300) INJECTION 61%
15.0000 mL | INTRAVENOUS | Status: AC
  Administered 2016-09-11: 30 mL via ORAL

## 2016-09-11 MED ORDER — DILTIAZEM HCL 25 MG/5ML IV SOLN
15.0000 mg | Freq: Once | INTRAVENOUS | Status: AC
  Administered 2016-09-11: 15 mg via INTRAVENOUS
  Filled 2016-09-11: qty 5

## 2016-09-11 MED ORDER — ATENOLOL 25 MG PO TABS
25.0000 mg | ORAL_TABLET | Freq: Once | ORAL | Status: AC
Start: 2016-09-11 — End: 2016-09-11
  Administered 2016-09-11: 25 mg via ORAL
  Filled 2016-09-11: qty 1

## 2016-09-11 MED ORDER — LEVOFLOXACIN IN D5W 750 MG/150ML IV SOLN
750.0000 mg | Freq: Once | INTRAVENOUS | Status: AC
  Administered 2016-09-11: 750 mg via INTRAVENOUS
  Filled 2016-09-11: qty 150

## 2016-09-11 MED ORDER — ATENOLOL 25 MG PO TABS
25.0000 mg | ORAL_TABLET | Freq: Two times a day (BID) | ORAL | Status: DC
Start: 2016-09-11 — End: 2016-09-13
  Administered 2016-09-12 (×2): 25 mg via ORAL
  Filled 2016-09-11 (×3): qty 1

## 2016-09-11 MED ORDER — ATENOLOL 25 MG PO TABS: 12.5000 mg | ORAL_TABLET | Freq: Every evening | ORAL | Status: DC

## 2016-09-11 MED ORDER — GLUCOSAMINE-CHONDROITIN 500-400 MG PO TABS: 1.0000 | ORAL_TABLET | Freq: Three times a day (TID) | ORAL | Status: DC

## 2016-09-11 MED ORDER — ENOXAPARIN SODIUM 40 MG/0.4ML ~~LOC~~ SOLN
40.0000 mg | SUBCUTANEOUS | Status: DC
  Administered 2016-09-12: 40 mg via SUBCUTANEOUS
  Filled 2016-09-11: qty 0.4

## 2016-09-11 MED ORDER — ONDANSETRON HCL 4 MG/2ML IJ SOLN
4.0000 mg | Freq: Four times a day (QID) | INTRAMUSCULAR | Status: DC | PRN
  Administered 2016-09-13: 4 mg via INTRAVENOUS
  Filled 2016-09-11: qty 2

## 2016-09-11 MED ORDER — LEVOTHYROXINE SODIUM 50 MCG PO TABS
50.0000 ug | ORAL_TABLET | Freq: Every day | ORAL | Status: DC
  Administered 2016-09-12 – 2016-09-13 (×2): 50 ug via ORAL
  Filled 2016-09-11 (×2): qty 1

## 2016-09-11 MED ORDER — ONDANSETRON HCL 4 MG PO TABS: 4.0000 mg | ORAL_TABLET | Freq: Four times a day (QID) | ORAL | Status: DC | PRN

## 2016-09-11 MED ORDER — ACETAMINOPHEN 650 MG RE SUPP: 650.0000 mg | Freq: Four times a day (QID) | RECTAL | Status: DC | PRN

## 2016-09-11 MED ORDER — SODIUM CHLORIDE 0.9 % IV SOLN
INTRAVENOUS | Status: DC
  Administered 2016-09-12: via INTRAVENOUS

## 2016-09-11 MED ORDER — ASPIRIN EC 81 MG PO TBEC
81.0000 mg | DELAYED_RELEASE_TABLET | Freq: Every day | ORAL | Status: DC
  Administered 2016-09-12 – 2016-09-13 (×2): 81 mg via ORAL
  Filled 2016-09-11 (×2): qty 1

## 2016-09-11 MED ORDER — ALBUTEROL SULFATE (2.5 MG/3ML) 0.083% IN NEBU
5.0000 mg | INHALATION_SOLUTION | Freq: Once | RESPIRATORY_TRACT | Status: AC
  Administered 2016-09-11: 5 mg via RESPIRATORY_TRACT
  Filled 2016-09-11: qty 6

## 2016-09-11 MED ORDER — DOCUSATE SODIUM 100 MG PO CAPS
100.0000 mg | ORAL_CAPSULE | Freq: Two times a day (BID) | ORAL | Status: DC
  Administered 2016-09-11 – 2016-09-13 (×4): 100 mg via ORAL
  Filled 2016-09-11 (×4): qty 1

## 2016-09-11 NOTE — ED Provider Notes (Signed)
[facility] Emergency Department Provider Note    First MD Initiated Contact with Patient 09/11/16 1646     (approximate)  I have reviewed the triage vital signs and the nursing notes.   HISTORY  Chief Complaint Nausea; Shortness of Breath; and Night Sweats    HPI Louis Sanchez is a 76 y.o. male Janalyn Rouse relative for history of paroxysmal A. fib presents with intermittent night sweats for the wife states her soaking through the mattress as well as nausea and weakness for 4 days. He denies any cough or shortness of breath but does feel generalized weakness. No measured fevers but has felt chills. Y says that his gait has been on steady. 2 nights ago had of midsternal chest pressure and discomfort during one of these night sweats. Denies any history of cancer. Wife is also concerned about bruising rash to bilateral lower extremities but the husband states that that is been there for quite some time. No history of trauma.   Past Medical History:  Diagnosis Date  . Arthritis   . Benign neoplasm of colon 2004  . 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 Heart disease   . Hypertension   . Other and unspecified hyperlipidemia 1997  . Other specified disorder of male genital organs(608.89) 214  . Sleep disturbance, unspecified   . Thyroid disease   . Unspecified hypothyroidism 2005   Family History  Problem Relation Age of Onset  . Alzheimer's disease Mother    Past Surgical History:  Procedure Laterality Date  . APPENDECTOMY  1953  . colon polyps  2009  . HERNIA REPAIR  1957   Right  . INGUINAL HERNIA REPAIR Right 2014  . SPINE SURGERY  1997   neck  . WRIST SURGERY     Patient Active Problem List   Diagnosis Date Noted  . Inguinal hernia 02/18/2013  . Benign neoplasm of colon 04/04/2008      Prior to Admission medications   Medication Sig Start Date End Date Taking? Authorizing Provider  aspirin 81 MG  tablet Take 81 mg by mouth daily.    Historical Provider, MD  atenolol (TENORMIN) 25 MG tablet Take 0.5 tablets by mouth every evening. 01/21/13   Historical Provider, MD  calcium carbonate (OS-CAL) 600 MG TABS Take 600 mg by mouth 2 (two) times daily with a meal.    Historical Provider, MD  fish oil-omega-3 fatty acids 1000 MG capsule Take 2 g by mouth daily.    Historical Provider, MD  glucosamine-chondroitin 500-400 MG tablet Take 1 tablet by mouth 3 (three) times daily.    Historical Provider, MD  Multiple Vitamin (MULTIVITAMIN) tablet Take 1 tablet by mouth daily.    Historical Provider, MD  Nutritional Supplements (VITAMIN D MAINTENANCE PO) Take 1 capsule by mouth daily.    Historical Provider, MD  polyethylene glycol powder (GLYCOLAX/MIRALAX) powder 255 grams one bottle for colonoscopy prep 05/10/13   JRobert Bellow MD  polyethylene glycol powder ([facility] powder 255 grams one bottle for colonoscopy prep 06/28/13   JRobert Bellow MD  sertraline (ZOLOFT) 100 MG tablet Take 1 tablet by mouth daily. 01/21/13   Historical Provider, MD  SYNTHROID 50 MCG tablet Take 1 tablet by mouth daily. 01/22/13   Historical Provider, MD  vitamin C (ASCORBIC ACID) 500 MG tablet Take 500 mg by mouth daily.    Historical Provider, MD  vitamin E 100 UNIT capsule Take 400 Units  by mouth daily.    Historical Provider, MD    Allergies Patient has no known allergies.    Social History Social History  Substance Use Topics  . Smoking status: Former Smoker    Years: 25.00    Types: Cigarettes  . Smokeless tobacco: Never Used  . Alcohol use 7.5 oz/week    15 Standard drinks or equivalent per week    Review of Systems Patient denies headaches, rhinorrhea, blurry vision, numbness, shortness of breath, chest pain, edema, cough, abdominal pain, nausea, vomiting, diarrhea, dysuria, fevers, rashes or hallucinations unless otherwise stated above in  HPI. ____________________________________________   PHYSICAL EXAM:  VITAL SIGNS: Vitals:   09/11/16 1419 09/11/16 1437  BP: 126/73 120/71  Pulse: 95 92  Resp: 18 18  Temp: 99.1 F (37.3 C) 99.6 F (37.6 C)    Constitutional: Alert and oriented. Well appearing and in no acute distress. Eyes: Conjunctivae are normal. PERRL. EOMI. Head: Atraumatic. Nose: No congestion/rhinnorhea. Mouth/Throat: Mucous membranes are moist.  Oropharynx non-erythematous. Neck: No stridor. Painless ROM. No cervical spine tenderness to palpation Hematological/Lymphatic/Immunilogical: No cervical lymphadenopathy. Cardiovascular: Normal rate, regular rhythm. Grossly normal heart sounds.  Good peripheral circulation. Respiratory: Normal respiratory effort.  No retractions. Lungs with coarse bibasilar breathsounds Gastrointestinal: Soft and nontender. No distention. No abdominal bruits. No CVA tenderness. Genitourinary:  Musculoskeletal: No lower extremity tenderness nor edema.  No joint effusions. Neurologic:  Normal speech and language. No gross focal neurologic deficits are appreciated. No gait instability. Skin:  Skin is warm, dry and intact. Scattered non blanching raised purple lesions to bilateral lower extremities Psychiatric: Mood and affect are normal. Speech and behavior are normal.  ____________________________________________   LABS (all labs ordered are listed, but only abnormal results are displayed)  Results for orders placed or performed during the hospital encounter of 09/11/16 (from the past 24 hour(s))  Basic metabolic panel     Status: Abnormal   Collection Time: 09/11/16  2:40 PM  Result Value Ref Range   Sodium 137 135 - 145 mmol/L   Potassium 4.0 3.5 - 5.1 mmol/L   Chloride 102 101 - 111 mmol/L   CO2 27 22 - 32 mmol/L   Glucose, Bld 101 (H) 65 - 99 mg/dL   BUN 17 6 - 20 mg/dL   Creatinine, Ser 0.75 0.61 - 1.24 mg/dL   Calcium 8.8 (L) 8.9 - 10.3 mg/dL   GFR calc non Af Amer  >60 >60 mL/min   GFR calc Af Amer >60 >60 mL/min   Anion gap 8 5 - 15  CBC     Status: Abnormal   Collection Time: 09/11/16  2:40 PM  Result Value Ref Range   WBC 12.3 (H) 3.8 - 10.6 K/uL   RBC 4.61 4.40 - 5.90 MIL/uL   Hemoglobin 13.9 13.0 - 18.0 g/dL   HCT 42.0 40.0 - 52.0 %   MCV 91.2 80.0 - 100.0 fL   MCH 30.1 26.0 - 34.0 pg   MCHC 33.0 32.0 - 36.0 g/dL   RDW 14.7 (H) 11.5 - 14.5 %   Platelets 156 150 - 440 K/uL  Urinalysis, Complete w Microscopic     Status: Abnormal   Collection Time: 09/11/16  2:41 PM  Result Value Ref Range   Color, Urine AMBER (A) YELLOW   APPearance CLEAR (A) CLEAR   Specific Gravity, Urine 1.021 1.005 - 1.030   pH 5.0 5.0 - 8.0   Glucose, UA NEGATIVE NEGATIVE mg/dL   Hgb urine dipstick NEGATIVE NEGATIVE  Bilirubin Urine NEGATIVE NEGATIVE   Ketones, ur NEGATIVE NEGATIVE mg/dL   Protein, ur NEGATIVE NEGATIVE mg/dL   Nitrite NEGATIVE NEGATIVE   Leukocytes, UA NEGATIVE NEGATIVE   RBC / HPF NONE SEEN 0 - 5 RBC/hpf   WBC, UA 0-5 0 - 5 WBC/hpf   Bacteria, UA NONE SEEN NONE SEEN   Squamous Epithelial / LPF 0-5 (A) NONE SEEN   Mucous PRESENT    ____________________________________________  EKG My review and personal interpretation at Time: 14:51   Indication: weakness  Rate: 90  Rhythm: afbi Axis: normal Other: non specific st changes, no STEMI ____________________________________________  RADIOLOGY  I personally reviewed all radiographic images ordered to evaluate for the above acute complaints and reviewed radiology reports and findings.  These findings were personally discussed with the patient.  Please see medical record for radiology report. ____________________________________________   PROCEDURES  Procedure(s) performed:  Procedures    Critical Care performed: no ____________________________________________   INITIAL IMPRESSION / ASSESSMENT AND PLAN / ED COURSE  Pertinent labs & imaging results that were available during my care  of the patient were reviewed by me and considered in my medical decision making (see chart for details).  DDX: Pneumonia, influenza, ACS, malignancy, vasculitis, TIA, dehydration  Louis Sanchez is a 76 y.o. who presents to the ED with Complaints but family is primary concern is regarding confusion and possible TIA with unsteady gait as well as night sweats. Patient arrives with low-grade fever and mildly tachycardic with evidence of A. fib which is chronic. Otherwise well-perfused. Does appear to By not hypoxic. Do wonder if there is some component of underlying COPD or pneumonia. We'll order CT head to evaluate for any evidence of CVA or bleed this seems less likely as he has no focal neuro deficits at this time.  We'll check labs. We'll provide IV fluids as well as nebulizer treatment. We'll continue to monitor.  Clinical Course as of Sep 11 2332  Nancy Fetter Sep 11, 2016  1929 Patient reassessed. Blood work significant for mildly elevated LFTs. Patient does seem very forgetful. Based on the evidence of palpable purpura that according the wife has popped up over the past 2-3 days patient's confusion will order MRI due to concern for vasculitis.  [PR]  2035 Patient now in A. fib with RVR.  We'll restart her home atenolol and observe. If fails to respond we'll start a Cardizem drip.  [PR]  2118 A. fib now rate controlled after dose of IV Cardizem and restarting his home atenolol. We'll continue to monitor. Given his abnormal labs and uncertain etiology of his acute complaints and I spoke with Dr. Ulis Rias who agrees to admit patient for further evaluation and management. We'll start on Levaquin due to concern for underlying infection causing his vasculitis in the setting of his tachycardia, tachypnea and leukocytosis.  Have discussed with the patient and available family all diagnostics and treatments performed thus far and all questions were answered to the best of my ability. The patient demonstrates understanding  and agreement with plan.  [PR]    Clinical Course User Index [PR] Merlyn Lot, MD     ____________________________________________   FINAL CLINICAL IMPRESSION(S) / ED DIAGNOSES  Final diagnoses:  LFT elevation  Acute encephalopathy  SIRS (systemic inflammatory response syndrome) (HCC)  Dyspnea, unspecified type  Purpura ([place])      NEW MEDICATIONS STARTED DURING THIS VISIT:  New Prescriptions   No medications on file     Note:  This document was prepared  using Systems analyst and may include unintentional dictation errors.    Merlyn Lot, MD 09/11/16 [phone]

## 2016-09-11 NOTE — ED Notes (Signed)
Admitting md at bedside

## 2016-09-11 NOTE — ED Triage Notes (Addendum)
Patient presents to the ED with night sweats and nausea with weakness x 4 days.  Patient also reports shortness of breath yesterday evening.  Patient had a negative tb test 2 days ago.  Patient is in no obvious distress at this time.  Denies shortness of breath at this time.  Denies chest pain at this time.  Denies vomiting and diarrhea.  Patient states, "everything tastes like metal."  Patient takes xarelto and has multiple purple splotchy areas to his lower legs.

## 2016-09-11 NOTE — ED Notes (Addendum)
Patient transported to MRI 

## 2016-09-11 NOTE — ED Notes (Signed)
Patient transported to Ultrasound 

## 2016-09-11 NOTE — H&P (Signed)
[facility] NAME: Louis Sanchez    MR#:  [id number]  DATE OF BIRTH:  1940-09-23  DATE OF ADMISSION:  09/11/2016  PRIMARY CARE PHYSICIAN: Denita Lung, DO   REQUESTING/REFERRING PHYSICIAN: Dr.Patrick Quentin Cornwall  CHIEF COMPLAINT:night sweats,nausea   Chief Complaint  Patient presents with  . Nausea  . Shortness of Breath  . Night Sweats    HISTORY OF PRESENT ILLNESS:  Veasna Sanchez  is a 76 y.o. male with a known history of afib,htn brought in by wife for nausea,weakness for 4 days ,having some night sweats for 2days.no abdominal  Pain.pt states that everything tastes like metal,no sob.had afib with RVR when he came,during my visit HR in 80s.with stable BP>purple spots in legs,according to patient its not new.they are not  Painful but itchy at times.  PAST MEDICAL HISTORY:   Past Medical History:  Diagnosis Date  . Arthritis   . Benign neoplasm of colon 2004  . Benign neoplasm of colon 04/04/2008   62m tubular adenoma of the ascending colon  . Heart disease   . Hypertension   . Other and unspecified hyperlipidemia 1997  . Other specified disorder of male genital organs(608.89) 214  . Sleep disturbance, unspecified   . Thyroid disease   . Unspecified hypothyroidism 2005    PAST SURGICAL HISTOIRY:   Past Surgical History:  Procedure Laterality Date  . APPENDECTOMY  1953  . colon polyps  2009  . HERNIA REPAIR  1957   Right  . INGUINAL HERNIA REPAIR Right 2014  . SPINE SURGERY  1997   neck  . WRIST SURGERY      SOCIAL HISTORY:   Social History  Substance Use Topics  . Smoking status: Former Smoker    Years: 25.00    Types: Cigarettes  . Smokeless tobacco: Never Used  . Alcohol use 7.5 oz/week    15 Standard drinks or equivalent per week    FAMILY HISTORY:   Family History  Problem Relation Age of Onset  . Alzheimer's disease Mother     DRUG ALLERGIES:  No Known Allergies  REVIEW OF SYSTEMS:   CONSTITUTIONAL: No fever, fatigue or weakness.  EYES: No blurred or double vision.  EARS, NOSE, AND THROAT: No tinnitus or ear pain.  RESPIRATORY: No cough, shortness of breath, wheezing or hemoptysis.  CARDIOVASCULAR: No chest pain, orthopnea, edema.  GASTROINTESTINAL: No nausea, vomiting, diarrhea or abdominal pain.  GENITOURINARY: No dysuria, hematuria.  ENDOCRINE: No polyuria, nocturia,  HEMATOLOGY: No anemia, easy bruising or bleeding SKIN: N MUSCULOSKELETAL: No joint pain or arthritis.   NEUROLOGIC: No tingling, numbness, weakness.  PSYCHIATRY: No anxiety or depression.   MEDICATIONS AT HOME:   Prior to Admission medications   Medication Sig Start Date End Date Taking? Authorizing Provider  atenolol (TENORMIN) 25 MG tablet Take 0.5 tablets by mouth every evening. 01/21/13  Yes Historical Provider, MD  B Complex-C (B-COMPLEX WITH VITAMIN C) tablet Take 1 tablet by mouth daily.   Yes Historical Provider, MD  fish oil-omega-3 fatty acids 1000 MG capsule Take 2 g by mouth daily.   Yes Historical Provider, MD  glucosamine-chondroitin 500-400 MG tablet Take 1 tablet by mouth 3 (three) times daily.   Yes Historical Provider, MD  Multiple Vitamin (MULTIVITAMIN) tablet Take 1 tablet by mouth daily.   Yes Historical Provider, MD  rivaroxaban (XARELTO) 20 MG TABS tablet Take 20 mg by mouth daily with supper.   Yes Historical Provider, MD  rosuvastatin (CRESTOR)  10 MG tablet Take 10 mg by mouth daily. 07/28/16  Yes Historical Provider, MD  sertraline (ZOLOFT) 100 MG tablet Take 1 tablet by mouth daily. 01/21/13  Yes Historical Provider, MD  SYNTHROID 50 MCG tablet Take 1 tablet by mouth daily. 01/22/13  Yes Historical Provider, MD  vitamin C (ASCORBIC ACID) 500 MG tablet Take 500 mg by mouth daily.   Yes Historical Provider, MD  vitamin E 100 UNIT capsule Take 400 Units by mouth daily.   Yes Historical Provider, MD      VITAL SIGNS:  Blood pressure 127/87, pulse 88, temperature 99.7 F (37.6  C), temperature source Oral, resp. rate (!) 24, height _0  (1.727 m), weight 74.8 kg (165 lb), SpO2 100 %.  PHYSICAL EXAMINATION:  GENERAL:  76 y.o.-year-old patient lying in the bed with no acute distress.  EYES: Pupils equal, round, reactive to light and accommodation. No scleral icterus. Extraocular muscles intact.  HEENT: Head atraumatic, normocephalic. Oropharynx and nasopharynx clear.  NECK:  Supple, no jugular venous distention. No thyroid enlargement, no tenderness.  LUNGS: Normal breath sounds bilaterally, no wheezing, rales,rhonchi or crepitation. No use of accessory muscles of respiration.  CARDIOVASCULAR: S1, S2 normal. No murmurs, rubs, or gallops.  ABDOMEN: Soft, nontender, nondistended. Bowel sounds present. No organomegaly or mass.  EXTREMITIES: No pedal edema, cyanosis, or clubbing.  NEUROLOGIC: Cranial nerves II through XII are intact. Muscle strength 5/5 in all extremities. Sensation intact. Gait not checked.  PSYCHIATRIC: The patient is alert and oriented x 3.  SKIN: No obvious rash, lesion, or ulcer.   LABORATORY PANEL:   CBC  Recent Labs Lab 09/11/16 1440  WBC 12.3*  HGB 13.9  HCT 42.0  PLT 156   ------------------------------------------------------------------------------------------------------------------  Chemistries   Recent Labs Lab 09/11/16 1440 09/11/16 1808  NA 137  --   K 4.0  --   CL 102  --   CO2 27  --   GLUCOSE 101*  --   BUN 17  --   CREATININE 0.75  --   CALCIUM 8.8*  --   AST  --  69*  ALT  --  148*  ALKPHOS  --  346*  BILITOT  --  0.7   ------------------------------------------------------------------------------------------------------------------  Cardiac Enzymes  Recent Labs Lab 09/11/16 1808  TROPONINI <0.03   ------------------------------------------------------------------------------------------------------------------  RADIOLOGY:  Dg Chest 2 View  Result Date: 09/11/2016 CLINICAL DATA:  Acute onset of  shortness of breath. Excessive perspiration. Initial encounter. EXAM: CHEST  2 VIEW COMPARISON:  None. FINDINGS: The lungs are well-aerated. Mild vascular congestion is noted. Mild bibasilar atelectasis is seen. Peribronchial thickening is noted. There is no evidence of pleural effusion or pneumothorax. The heart is borderline normal in size. No acute osseous abnormalities are seen. Cervical spinal fusion hardware is noted. IMPRESSION: Mild vascular congestion noted. Mild bibasilar atelectasis seen. Peribronchial thickening noted. Electronically Signed   By: Garald Balding M.D.   On: 09/11/2016 17:16   Ct Head Wo Contrast  Result Date: 09/11/2016 CLINICAL DATA:  Mental status changes for 1 week. EXAM: CT HEAD WITHOUT CONTRAST TECHNIQUE: Contiguous axial images were obtained from the base of the skull through the vertex without intravenous contrast. COMPARISON:  01/30/2014 FINDINGS: Brain: There is no evidence for acute hemorrhage, hydrocephalus, mass lesion, or abnormal extra-axial fluid collection. No definite CT evidence for acute infarction. Patchy low attenuation in the deep hemispheric and periventricular white matter is nonspecific, but likely reflects chronic microvascular ischemic demyelination. Vascular: No hyperdense vessel or unexpected calcification. Skull:  No evidence for fracture. No worrisome lytic or sclerotic lesion. Sinuses/Orbits: No acute finding.The visualized paranasal sinuses and mastoid air cells are clear. Visualized portions of the globes and intraorbital fat are unremarkable. Other: None. IMPRESSION: 1. No acute intracranial abnormality. 2. Chronic small vessel white matter ischemic disease. Electronically Signed   By: Misty Stanley M.D.   On: 09/11/2016 18:00   Mr Brain Wo Contrast  Result Date: 09/11/2016 CLINICAL DATA:  Initial evaluation for acute altered mental status. Palpable purpura. Evaluate for stroke or vasculitis. EXAM: MRI HEAD WITHOUT CONTRAST TECHNIQUE: Multiplanar,  multiecho pulse sequences of the brain and surrounding structures were obtained without intravenous contrast. COMPARISON:  Prior CT from earlier same day. Comparison also made with prior MRI from 01/31/2014. FINDINGS: Brain: Study mildly degraded by motion artifact. Diffuse prominence of the CSF containing spaces is compatible with generalized age-related cerebral atrophy. Patchy and confluent T2/FLAIR hyperintensity within the periventricular, deep, and subcortical white matter both cerebral hemispheres. Patchy T2/FLAIR abnormality present within the pons as well. Changes most like related to chronic microvascular ischemic disease, moderate nature. Overall, changes are similar to previous. No abnormal foci of restricted diffusion to suggest acute or subacute ischemia. Gray-white matter differentiation maintained. No evidence for acute or chronic intracranial hemorrhage. No cortical encephalomalacia to suggest prior cortical infarction. No mass lesion, midline shift, or mass effect. Ventricles within normal limits for size without hydrocephalus. No extra-axial fluid collection. Major dural sinuses are grossly patent. Pituitary gland and suprasellar region within normal limits. Vascular: Major intracranial vascular flow voids are maintained. Skull and upper cervical spine: Craniocervical junction within normal limits. Mild degenerative spondylolysis noted within the upper cervical spine without significant stenosis. Bone marrow signal intensity within normal limits. No scalp soft tissue abnormality. Sinuses/Orbits: Globes and orbital soft tissues within normal limits. Scattered mucosal thickening within the ethmoidal air cells and right maxillary sinus. Paranasal sinuses are otherwise clear. Trace opacity bilateral mastoid air cells, of doubtful significance. Inner ear structures grossly normal. IMPRESSION: 1. No acute intracranial infarct or other process identified. 2. Moderate chronic small vessel ischemic changes  involving the cerebral hemispheres and brainstem, similar to previous. Electronically Signed   By: Jeannine Boga M.D.   On: 09/11/2016 23:10    EKG:   Orders placed or performed during the hospital encounter of 09/11/16  . ED EKG  . ED EKG  . ED EKG  . ED EKG   afib with rate 89 bpm. IMPRESSION AND PLAN:  76 yr old male with chronic afib on xarelto comes with nausea,night sweats,and generalized weakness; Symptoms are non specific, found elevated LFT with elevated alk phos; 1.nausea,slightly elevated wbc and elevated LFTS;check CT abdomen,empiric abx,GI consult,hold  Statins, 2.altered level of consciousness reported by wife but pt completely alert and answered my questions appropriately with out neuro deficit;MRI brain negative for stroke. 3.rash in legs:likley allergic dermatitis:add prednisone   4,chronic afib;rate controlled;continue xarelto and b blocker,monitor on tele D/w wife All the records are reviewed and case discussed with ED provider. Management plans discussed with the patient, family and they are in agreement.  CODE STATUS: full  TOTAL TIME TAKING CARE OF THIS PATIENT: 55 minutes.    Epifanio Lesches M.D on 09/11/2016 at 11:16 PM  Between 7am to 6pm - Pager - [phone]  After 6pm go to [email or web address] - password [email or web address] [facility]  Office  [phone]  CC: Primary care physician; Denita Lung, DO  Note: This dictation was prepared with Dragon dictation along with smaller phrase technology.  Any transcriptional errors that result from this process are unintentional.

## 2016-09-11 NOTE — ED Notes (Signed)
Wife remains in room as patient is in [place]. Offered nutrition, apple juice and a meal tray. Patient was gracious and states she has not eaten all day as she is a chemo patient. Wife good support system for patient. Will continue to monitor.

## 2016-09-11 NOTE — Progress Notes (Signed)
Pharmacy Antibiotic Note  Louis Sanchez is a 76 y.o. male admitted on 09/11/2016 with IAI.  Pharmacy has been consulted for levofloxacin dosing.  Plan: Levofloxacin 750 mg IV Q24H  Height: '5\' 8"'$  (172.7 cm) Weight: 165 lb (74.8 kg) IBW/kg (Calculated) : 68.4  Temp (24hrs), Avg:99.3 F (37.4 C), Min:98.9 F (37.2 C), Max:99.7 F (37.6 C)   Recent Labs Lab 09/11/16 1440 09/11/16 1808  WBC 12.3*  --   CREATININE 0.75  --   LATICACIDVEN  --  1.1    Estimated Creatinine Clearance: 78.4 mL/min (by C-G formula based on SCr of 0.75 mg/dL).    No Known Allergies  Thank you for allowing pharmacy to be a part of this patient's care.  Laural Benes, Pharm.D., BCPS Clinical Pharmacist 09/11/2016 9:40 PM

## 2016-09-12 ENCOUNTER — Inpatient Hospital Stay: Payer: Medicare Other

## 2016-09-12 DIAGNOSIS — K2920 Alcoholic gastritis without bleeding: Secondary | ICD-10-CM | POA: Insufficient documentation

## 2016-09-12 DIAGNOSIS — R945 Abnormal results of liver function studies: Secondary | ICD-10-CM

## 2016-09-12 DIAGNOSIS — R7989 Other specified abnormal findings of blood chemistry: Secondary | ICD-10-CM

## 2016-09-12 LAB — IRON AND TIBC
IRON: 32 ug/dL — AB (ref 45–182)
Saturation Ratios: 15 % — ABNORMAL LOW (ref 17.9–39.5)
TIBC: 216 ug/dL — ABNORMAL LOW (ref 250–450)
UIBC: 184 ug/dL

## 2016-09-12 LAB — HEPATIC FUNCTION PANEL
ALBUMIN: 2.9 g/dL — AB (ref 3.5–5.0)
ALT: 119 U/L — ABNORMAL HIGH (ref 17–63)
AST: 61 U/L — ABNORMAL HIGH (ref 15–41)
Alkaline Phosphatase: 345 U/L — ABNORMAL HIGH (ref 38–126)
Bilirubin, Direct: 0.3 mg/dL (ref 0.1–0.5)
Indirect Bilirubin: 0.5 mg/dL (ref 0.3–0.9)
TOTAL PROTEIN: 6.3 g/dL — AB (ref 6.5–8.1)
Total Bilirubin: 0.8 mg/dL (ref 0.3–1.2)

## 2016-09-12 LAB — BASIC METABOLIC PANEL
Anion gap: 7 (ref 5–15)
BUN: 15 mg/dL (ref 6–20)
CO2: 26 mmol/L (ref 22–32)
CREATININE: 0.76 mg/dL (ref 0.61–1.24)
Calcium: 8.3 mg/dL — ABNORMAL LOW (ref 8.9–10.3)
Chloride: 104 mmol/L (ref 101–111)
GFR calc Af Amer: >60 mL/min (ref >60)
GLUCOSE: 156 mg/dL — AB (ref 65–99)
POTASSIUM: 3.8 mmol/L (ref 3.5–5.1)
Sodium: 137 mmol/L (ref 135–145)

## 2016-09-12 LAB — CBC
HEMATOCRIT: 39.8 % — AB (ref 40.0–52.0)
Hemoglobin: 13.3 g/dL (ref 13.0–18.0)
MCH: 30.3 pg (ref 26.0–34.0)
MCHC: 33.3 g/dL (ref 32.0–36.0)
MCV: 90.8 fL (ref 80.0–100.0)
Platelets: 135 10*3/uL — ABNORMAL LOW (ref 150–440)
RBC: 4.38 MIL/uL — ABNORMAL LOW (ref 4.40–5.90)
RDW: 14.8 % — AB (ref 11.5–14.5)
WBC: 10 10*3/uL (ref 3.8–10.6)

## 2016-09-12 LAB — AMMONIA: AMMONIA: 20 umol/L (ref 9–35)

## 2016-09-12 LAB — FERRITIN: Ferritin: 147 ng/mL (ref 24–336)

## 2016-09-12 LAB — GLUCOSE, CAPILLARY: GLUCOSE-CAPILLARY: 92 mg/dL (ref 65–99)

## 2016-09-12 LAB — FIBRIN DERIVATIVES D-DIMER (ARMC ONLY): FIBRIN DERIVATIVES D-DIMER (ARMC): 1716 — AB (ref 0–499)

## 2016-09-12 MED ORDER — LEVOFLOXACIN IN D5W 750 MG/150ML IV SOLN
750.0000 mg | INTRAVENOUS | Status: DC
  Administered 2016-09-12: 750 mg via INTRAVENOUS
  Filled 2016-09-12 (×2): qty 150

## 2016-09-12 MED ORDER — RIVAROXABAN 20 MG PO TABS: 20.0000 mg | ORAL_TABLET | Freq: Every day | ORAL | Status: DC

## 2016-09-12 MED ORDER — IPRATROPIUM-ALBUTEROL 0.5-2.5 (3) MG/3ML IN SOLN
3.0000 mL | Freq: Four times a day (QID) | RESPIRATORY_TRACT | Status: DC
  Administered 2016-09-13 (×3): 3 mL via RESPIRATORY_TRACT
  Filled 2016-09-12 (×3): qty 3

## 2016-09-12 MED ORDER — GUAIFENESIN ER 600 MG PO TB12
600.0000 mg | ORAL_TABLET | Freq: Two times a day (BID) | ORAL | Status: DC
  Administered 2016-09-12 – 2016-09-13 (×3): 600 mg via ORAL
  Filled 2016-09-12 (×3): qty 1

## 2016-09-12 MED ORDER — SODIUM CHLORIDE 0.9% FLUSH: 3.0000 mL | INTRAVENOUS | Status: DC | PRN

## 2016-09-12 MED ORDER — RIVAROXABAN 20 MG PO TABS
20.0000 mg | ORAL_TABLET | Freq: Every day | ORAL | Status: DC
  Administered 2016-09-12 – 2016-09-13 (×2): 20 mg via ORAL
  Filled 2016-09-12 (×2): qty 1

## 2016-09-12 MED ORDER — SPIRONOLACTONE 25 MG PO TABS
50.0000 mg | ORAL_TABLET | Freq: Two times a day (BID) | ORAL | Status: DC
  Administered 2016-09-12 – 2016-09-13 (×2): 50 mg via ORAL
  Filled 2016-09-12 (×2): qty 2

## 2016-09-12 MED ORDER — IPRATROPIUM-ALBUTEROL 0.5-2.5 (3) MG/3ML IN SOLN
3.0000 mL | RESPIRATORY_TRACT | Status: DC
  Administered 2016-09-12 (×2): 3 mL via RESPIRATORY_TRACT
  Filled 2016-09-12 (×2): qty 3

## 2016-09-12 MED ORDER — GADOBENATE DIMEGLUMINE 529 MG/ML IV SOLN
15.0000 mL | Freq: Once | INTRAVENOUS | Status: AC | PRN
  Administered 2016-09-12: 15 mL via INTRAVENOUS

## 2016-09-12 MED ORDER — IOPAMIDOL (ISOVUE-300) INJECTION 61%
100.0000 mL | Freq: Once | INTRAVENOUS | Status: AC | PRN
  Administered 2016-09-12: 100 mL via INTRAVENOUS

## 2016-09-12 MED ORDER — SODIUM CHLORIDE 0.9% FLUSH
3.0000 mL | Freq: Two times a day (BID) | INTRAVENOUS | Status: DC
  Administered 2016-09-12 – 2016-09-13 (×2): 3 mL via INTRAVENOUS

## 2016-09-12 NOTE — Consult Note (Signed)
[name], MD [facility]  [address] [place], [place] [address] Phone: [phone] Fax : [phone]  Consultation  Referring Provider:     Dr. Ether Griffins Primary Care Physician:  Denita Lung, DO Primary Gastroenterologist:  Dr. Epimenio Foot          Reason for Consultation:     Abnormal liver enzymes  Date of Admission:  09/11/2016 Date of Consultation:  09/12/2016         HPI:   Louis Sanchez is a 76 y.o. male who is admitted with a history of A. Fib and was brought in because of nausea weakness in the last 4 days and some night sweats.  The patient was found to have abnormal liver enzymes.  The patient reports that he has been drinking 6 beers a day for the last 50 years.  He denies being told that he had abnormal liver enzymes in the past although he has had chronic thrombocytopenia is now shown to have decreased albumin and in 2015 had elevated liver enzymes although not to the degree they are now. The patient had a MRI today that showed nodularity of the liver with multiple lesions in the liver that may be multifocal hepatocellular carcinoma.  The patient also was found to have borderline splenomegaly.  This patient's liver enzymes at admission showed an AST of 69 with an MALT of 148 with an alkaline phosphatase of 346.  The patient's albumin was 3.3 but the bilirubin was normal.  Today the albumin is down to 2.9 with the AST slightly down to 61 with the ALT down to 119 and Alk phos is the same. The patient denies any abdominal pain but reports that he has been having redness of his skin.  Past Medical History:  Diagnosis Date  . Arthritis   . Benign neoplasm of colon 2004  . 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 Heart disease   . Hypertension   . Other and unspecified hyperlipidemia 1997  . Other specified disorder of male genital organs(608.89) 214  . Sleep disturbance, unspecified   . Thyroid disease   . Unspecified hypothyroidism  2005    Past Surgical History:  Procedure Laterality Date  . APPENDECTOMY  1953  . colon polyps  2009  . HERNIA REPAIR  1957   Right  . INGUINAL HERNIA REPAIR Right 2014  . SPINE SURGERY  1997   neck  . WRIST SURGERY      Prior to Admission medications   Medication Sig Start Date End Date Taking? Authorizing Provider  atenolol (TENORMIN) 25 MG tablet Take 0.5 tablets by mouth every evening. 01/21/13  Yes Historical Provider, MD  B Complex-C (B-COMPLEX WITH VITAMIN C) tablet Take 1 tablet by mouth daily.   Yes Historical Provider, MD  fish oil-omega-3 fatty acids 1000 MG capsule Take 2 g by mouth daily.   Yes Historical Provider, MD  glucosamine-chondroitin 500-400 MG tablet Take 1 tablet by mouth 3 (three) times daily.   Yes Historical Provider, MD  Multiple Vitamin (MULTIVITAMIN) tablet Take 1 tablet by mouth daily.   Yes Historical Provider, MD  rivaroxaban (XARELTO) 20 MG TABS tablet Take 20 mg by mouth daily with supper.   Yes Historical Provider, MD  rosuvastatin (CRESTOR) 10 MG tablet Take 10 mg by mouth daily. 07/28/16  Yes Historical Provider, MD  sertraline (ZOLOFT) 100 MG tablet Take 1 tablet by mouth daily. 01/21/13  Yes Historical Provider, MD  SYNTHROID 50  MCG tablet Take 1 tablet by mouth daily. 01/22/13  Yes Historical Provider, MD  vitamin C (ASCORBIC ACID) 500 MG tablet Take 500 mg by mouth daily.   Yes Historical Provider, MD  vitamin E 100 UNIT capsule Take 400 Units by mouth daily.   Yes Historical Provider, MD    Family History  Problem Relation Age of Onset  . Alzheimer's disease Mother      Social History  Substance Use Topics  . Smoking status: Former Smoker    Years: 25.00    Types: Cigarettes  . Smokeless tobacco: Never Used  . Alcohol use 7.5 oz/week    15 Standard drinks or equivalent per week    Allergies as of 09/11/2016  . (No Known Allergies)    Review of Systems:    All systems reviewed and negative except where noted in HPI.   Physical  Exam:  Vital signs in last 24 hours: Temp:  [98.9 F (37.2 C)-99.7 F (37.6 C)] 98.9 F (37.2 C) (02/19 0838) Pulse Rate:  [86-128] 104 (02/19 0838) Resp:  [16-27] 16 (02/19 0838) BP: (113-130)/(69-87) 130/79 (02/19 0838) SpO2:  [91 %-100 %] 100 % (02/19 0838) Last BM Date: 09/12/16 General:   Pleasant, cooperative in NAD Head:  Normocephalic and atraumatic. Eyes:   No icterus.   Conjunctiva pink. PERRLA. Ears:  Normal auditory acuity. Neck:  Supple; no masses or thyroidomegaly Lungs: Respirations even and unlabored. Lungs clear to auscultation bilaterally.   No wheezes, crackles, or rhonchi.  Heart:  Regular rate and rhythm;  Without murmur, clicks, rubs or gallops Abdomen:  Soft, nondistended, nontender. Normal bowel sounds. The liver is 4 cm below the costal margin.  No rebound or guarding.  Rectal:  Not performed. Msk:  Symmetrical without gross deformities.   Extremities:  Without edema, cyanosis or clubbing. Neurologic:  Alert and oriented x3;  grossly normal neurologically. Skin:  Intact without significant lesions or rashes. Cervical Nodes:  No significant cervical adenopathy. Psych:  Alert and cooperative. Normal affect.  LAB RESULTS:  Recent Labs  09/11/16 1440 09/12/16 0416  WBC 12.3* 10.0  HGB 13.9 13.3  HCT 42.0 39.8*  PLT 156 135*   BMET  Recent Labs  09/11/16 1440 09/12/16 0416  NA 137 137  K 4.0 3.8  CL 102 104  CO2 27 26  GLUCOSE 101* 156*  BUN 17 15  CREATININE 0.75 0.76  CALCIUM 8.8* 8.3*   LFT  Recent Labs  09/12/16 1411  PROT 6.3*  ALBUMIN 2.9*  AST 61*  ALT 119*  ALKPHOS 345*  BILITOT 0.8  BILIDIR 0.3  IBILI 0.5   PT/INR  Recent Labs  09/11/16 1808  LABPROT 18.5*  INR 1.52    STUDIES: Dg Chest 2 View  Result Date: 09/11/2016 CLINICAL DATA:  Acute onset of shortness of breath. Excessive perspiration. Initial encounter. EXAM: CHEST  2 VIEW COMPARISON:  None. FINDINGS: The lungs are well-aerated. Mild vascular  congestion is noted. Mild bibasilar atelectasis is seen. Peribronchial thickening is noted. There is no evidence of pleural effusion or pneumothorax. The heart is borderline normal in size. No acute osseous abnormalities are seen. Cervical spinal fusion hardware is noted. IMPRESSION: Mild vascular congestion noted. Mild bibasilar atelectasis seen. Peribronchial thickening noted. Electronically Signed   By: Garald Balding M.D.   On: 09/11/2016 17:16   Ct Head Wo Contrast  Result Date: 09/11/2016 CLINICAL DATA:  Mental status changes for 1 week. EXAM: CT HEAD WITHOUT CONTRAST TECHNIQUE: Contiguous axial images were obtained  from the base of the skull through the vertex without intravenous contrast. COMPARISON:  01/30/2014 FINDINGS: Brain: There is no evidence for acute hemorrhage, hydrocephalus, mass lesion, or abnormal extra-axial fluid collection. No definite CT evidence for acute infarction. Patchy low attenuation in the deep hemispheric and periventricular white matter is nonspecific, but likely reflects chronic microvascular ischemic demyelination. Vascular: No hyperdense vessel or unexpected calcification. Skull: No evidence for fracture. No worrisome lytic or sclerotic lesion. Sinuses/Orbits: No acute finding.The visualized paranasal sinuses and mastoid air cells are clear. Visualized portions of the globes and intraorbital fat are unremarkable. Other: None. IMPRESSION: 1. No acute intracranial abnormality. 2. Chronic small vessel white matter ischemic disease. Electronically Signed   By: Misty Stanley M.D.   On: 09/11/2016 18:00   Mr Brain Wo Contrast  Result Date: 09/11/2016 CLINICAL DATA:  Initial evaluation for acute altered mental status. Palpable purpura. Evaluate for stroke or vasculitis. EXAM: MRI HEAD WITHOUT CONTRAST TECHNIQUE: Multiplanar, multiecho pulse sequences of the brain and surrounding structures were obtained without intravenous contrast. COMPARISON:  Prior CT from earlier same day.  Comparison also made with prior MRI from 01/31/2014. FINDINGS: Brain: Study mildly degraded by motion artifact. Diffuse prominence of the CSF containing spaces is compatible with generalized age-related cerebral atrophy. Patchy and confluent T2/FLAIR hyperintensity within the periventricular, deep, and subcortical white matter both cerebral hemispheres. Patchy T2/FLAIR abnormality present within the pons as well. Changes most like related to chronic microvascular ischemic disease, moderate nature. Overall, changes are similar to previous. No abnormal foci of restricted diffusion to suggest acute or subacute ischemia. Gray-white matter differentiation maintained. No evidence for acute or chronic intracranial hemorrhage. No cortical encephalomalacia to suggest prior cortical infarction. No mass lesion, midline shift, or mass effect. Ventricles within normal limits for size without hydrocephalus. No extra-axial fluid collection. Major dural sinuses are grossly patent. Pituitary gland and suprasellar region within normal limits. Vascular: Major intracranial vascular flow voids are maintained. Skull and upper cervical spine: Craniocervical junction within normal limits. Mild degenerative spondylolysis noted within the upper cervical spine without significant stenosis. Bone marrow signal intensity within normal limits. No scalp soft tissue abnormality. Sinuses/Orbits: Globes and orbital soft tissues within normal limits. Scattered mucosal thickening within the ethmoidal air cells and right maxillary sinus. Paranasal sinuses are otherwise clear. Trace opacity bilateral mastoid air cells, of doubtful significance. Inner ear structures grossly normal. IMPRESSION: 1. No acute intracranial infarct or other process identified. 2. Moderate chronic small vessel ischemic changes involving the cerebral hemispheres and brainstem, similar to previous. Electronically Signed   By: Jeannine Boga M.D.   On: 09/11/2016 23:10   Mr  Abdomen W Wo Contrast  Result Date: 09/12/2016 CLINICAL DATA:  76 year old male with multiple indeterminate lesions noted on recent CT examination. Further evaluation to exclude malignancy. EXAM: MRI ABDOMEN WITHOUT AND WITH CONTRAST TECHNIQUE: Multiplanar multisequence MR imaging of the abdomen was performed both before and after the administration of intravenous contrast. CONTRAST:  106m MULTIHANCE GADOBENATE DIMEGLUMINE 529 MG/ML IV SOLN COMPARISON:  No prior abdominal MRI. CT the abdomen and pelvis 09/12/2016. FINDINGS: Lower chest: Small bilateral pleural effusions lying dependently. Cardiomegaly. Increased T2 signal intensity in a lace-like network throughout the visualize lung bases suggestive of underlying edema. Hepatobiliary: There are several T2 hyperintense lesions scattered throughout the liver. The smallest of these lesions are only well visualized on T2 weighted sequences and measure up to 8 mm in diameter (largest is on image 11 of series 6) in segment 8. However, the largest T2  hyperintense lesion is in the central aspect of segment 4A. This lesion, and another lesion which is essentially occult on T2 weighted sequences in segment 6/7 both demonstrate similar enhancement characteristics; specifically, these lesions appear very subtly hypovascular on early phase post gadolinium imaging with progressive washout on delayed phase imaging. These lesions measure 3.5 x 3.0 cm in segment 4A centrally (image 20 of series 20) and 4.2 x 2.4 cm in segments 6/7 (image 32 of series 20). Notably, both of these lesions appear to demonstrate some mild diffusion restriction on diffusion-weighted imaging. The liver does have a very subtle nodular contour, which could indicate early changes of cirrhosis. No intra or extrahepatic biliary ductal dilatation. Gallbladder is unremarkable in appearance. Pancreas: No pancreatic mass or peripancreatic inflammatory changes. No pancreatic ductal dilatation. Spleen: Spleen  appears borderline enlarged measuring 4.9 x 12.5 x 13.1 cm (estimated splenic volume of 402 mL). Adrenals/Urinary Tract: Bilateral kidneys and bilateral adrenal glands are normal in appearance. There is no hydroureteronephrosis in the visualized abdomen. Stomach/Bowel: Visualized portions are unremarkable. Vascular/Lymphatic: Atherosclerosis in the abdominal vasculature, without evidence of aneurysm. No lymphadenopathy noted in the abdomen. Other: No significant volume of ascites noted in the visualized portions of the peritoneal cavity. Musculoskeletal: No aggressive osseous lesions are noted in the visualized portions of the skeleton. IMPRESSION: 1. The liver lesions noted on recent CT examination are indeterminate. The largest of these lesions in segment 4A centrally, and between segments 6/7 demonstrate very unusual imaging characteristics. Given slight nodularity of the liver, mild diffusion restriction of the lesions, and the enhancement characteristics, the possibility of multifocal hepatocellular carcinoma should be considered. Further evaluation with percutaneous biopsy is suggested if clinically appropriate at this time. 2. The appearance of the lung bases suggests edema. Increasing small bilateral pleural effusions. 3. Borderline splenomegaly. 4. Aortic atherosclerosis. Electronically Signed   By: Vinnie Langton M.D.   On: 09/12/2016 15:05   Ct Abdomen Pelvis W Contrast  Result Date: 09/12/2016 CLINICAL DATA:  76 year old male with history of AFib and hypertension presenting with nausea and weakness. EXAM: CT ABDOMEN AND PELVIS WITH CONTRAST TECHNIQUE: Multidetector CT imaging of the abdomen and pelvis was performed using the standard protocol following bolus administration of intravenous contrast. CONTRAST:  16m ISOVUE-300 IOPAMIDOL (ISOVUE-300) INJECTION 61% COMPARISON:  Abdominal ultrasound dated 09/11/2016 FINDINGS: Evaluation of this exam is limited due to respiratory motion artifact. Lower  chest: Bibasilar streaky densities most consistent with atelectasis/ scarring. There is mild cardiomegaly with mild dilatation of the right cardiac chambers. There is mild pectus excavatum deformity of. No intra-abdominal free air or free fluid. Hepatobiliary: There is apparent mild fatty infiltration of the liver. Multiple ill-defined liver lesions noted measuring up to 2.1 x 1.4 cm in the inferior aspect of the right lobe of the liver anteriorly. A 1.2 x 1.3 cm partially calcified lesion in the right lobe of the liver more centrally (series 2, image 17) noted. These lesions are not characterized on this CT. Further evaluation with MRI without and with contrast is recommended. There is no intrahepatic biliary ductal dilatation. The gallbladder is unremarkable. Pancreas: Unremarkable. No pancreatic ductal dilatation or surrounding inflammatory changes. Spleen: Multiple faintly visualized splenic hypodense lesions measuring up to 1.3 cm in the superior pole are indeterminate but may represent cysts or hemangioma. Adrenals/Urinary Tract: The adrenal glands appear unremarkable. A 1 cm focal cortical indentation and non enhancement involving the interpolar aspect of the right kidney is indeterminate but likely represents an area of scarring cor a small renal  cyst. The kidneys are otherwise unremarkable. There is no hydronephrosis on either side. The visualized ureters and urinary bladder appear unremarkable. Stomach/Bowel: Oral contrast opacifies loops of small bowel and traverses into the colon without evidence of bowel obstruction. No active inflammatory changes of the bowel noted. The appendix is not visualized with certainty. No inflammatory changes identified in the right lower quadrant. Vascular/Lymphatic: There is moderate aortoiliac atherosclerotic disease. The origins of the celiac axis, SMA, IMA as well as the origins of the renal arteries are patent. The SMV, splenic vein, and main portal vein are patent. No  portal venous gas identified. There is no adenopathy. Reproductive: The prostate and seminal vesicles are grossly unremarkable. Other: Small fat containing umbilical hernia. Small right posterior diaphragmatic defect with herniation of small amount of abdominal fat. Probable right inguinal hernia repair Musculoskeletal: Mild degenerative changes of the spine. No acute fracture. IMPRESSION: 1. Multiple hepatic lesions which are ill-defined and incompletely characterized on the CT. Further characterization with MRI is recommended. 2. Faint splenic hypodense lesions, incompletely characterized, possibly versus hemangioma. 3. No evidence of bowel obstruction or active inflammation. 4. Mild cardiomegaly head with enlargement of right cardiac chambers. Electronically Signed   By: Anner Crete M.D.   On: 09/12/2016 02:26   US Venous Img Lower Bilateral  Result Date: 09/12/2016 CLINICAL DATA:  Lower extremity edema bilaterally EXAM: BILATERAL LOWER EXTREMITY VENOUS DUPLEX ULTRASOUND TECHNIQUE: Gray-scale sonography with graded compression, as well as color Doppler and duplex ultrasound were performed to evaluate the lower extremity deep venous systems from the level of the common femoral vein and including the common femoral, femoral, profunda femoral, popliteal and calf veins including the posterior tibial, peroneal and gastrocnemius veins when visible. The superficial great saphenous vein was also interrogated. Spectral Doppler was utilized to evaluate flow at rest and with distal augmentation maneuvers in the common femoral, femoral and popliteal veins. COMPARISON:  None. FINDINGS: RIGHT LOWER EXTREMITY Common Femoral Vein: No evidence of thrombus. Normal compressibility, respiratory phasicity and response to augmentation. Saphenofemoral Junction: No evidence of thrombus. Normal compressibility and flow on color Doppler imaging. Profunda Femoral Vein: No evidence of thrombus. Normal compressibility and flow on  color Doppler imaging. Femoral Vein: No evidence of thrombus. Normal compressibility, respiratory phasicity and response to augmentation. Popliteal Vein: No evidence of thrombus. Normal compressibility, respiratory phasicity and response to augmentation. Calf Veins: No evidence of thrombus. Normal compressibility and flow on color Doppler imaging. Superficial Great Saphenous Vein: No evidence of thrombus. Normal compressibility and flow on color Doppler imaging. Venous Reflux:  None. Other Findings:  None. LEFT LOWER EXTREMITY Common Femoral Vein: No evidence of thrombus. Normal compressibility, respiratory phasicity and response to augmentation. Saphenofemoral Junction: No evidence of thrombus. Normal compressibility and flow on color Doppler imaging. Profunda Femoral Vein: No evidence of thrombus. Normal compressibility and flow on color Doppler imaging. Femoral Vein: No evidence of thrombus. Normal compressibility, respiratory phasicity and response to augmentation. Popliteal Vein: No evidence of thrombus. Normal compressibility, respiratory phasicity and response to augmentation. Calf Veins: No evidence of thrombus. Normal compressibility and flow on color Doppler imaging. Superficial Great Saphenous Vein: No evidence of thrombus. Normal compressibility and flow on color Doppler imaging. Venous Reflux:  None. Other Findings:  None. IMPRESSION: No evidence of deep venous thrombosis in either lower extremity. Electronically Signed   By: Lowella Grip III M.D.   On: 09/12/2016 15:09   US Abdomen Limited Ruq  Result Date: 09/12/2016 CLINICAL DATA:  Initial evaluation for elevated LFTs, nausea,  weakness. EXAM: US ABDOMEN LIMITED - RIGHT UPPER QUADRANT COMPARISON:  None. FINDINGS: Gallbladder: No gallstones or wall thickening visualized. No sonographic Murphy sign noted by sonographer. Common bile duct: Diameter: 2.9 mm Liver: Several well-circumscribed echogenic lesions were seen as follows semi: 1.3 x 0.9 x  1.5 cm lesion within the caudate lobe. Additional 2.8 x 3.7 x 3.4 cm lesion within the right hepatic lobe. Second 1.1 x 1.2 x 1.3 cm lesion present within the right hepatic lobe. Third right hepatic lobe lesion measured 3.8 x 2.4 x 3.6 cm. Underlying hepatic echotexture within normal limits. Trace right pleural effusion. IMPRESSION: 1. Normal sonographic appearance of the gallbladder. No biliary dilatation. 2. Multiple echogenic well-circumscribed lesions within the liver as above, indeterminate, but most characteristic for probable small hemangiomas. 3. Trace right pleural effusion. Electronically Signed   By: Jeannine Boga M.D.   On: 09/12/2016 00:01      Impression / Plan:   SAUNDERS ARLINGTON is a 76 y.o. 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  The patient will have labs sent off for other possible causes of abnormal liver enzymes.  The patient has also been told to stop his alcohol abuse.  The patient will have his alpha-fetoprotein also checked.  The patient and his wife have been explained the plan and agree with it.   Thank you for involving me in the care of this patient.      LOS: 1 day   [name], MD  09/12/2016, 4:06 PM   Note: This dictation was prepared with Dragon dictation along with smaller phrase technology. Any transcriptional errors that result from this process are unintentional.

## 2016-09-12 NOTE — Care Management (Signed)
Admitted to this facility with the diagnosis of atrial fibrillation. Lives with wife, Louis Sanchez [phone]). Last seen Louis Lung DO last 08/31/16. Would like to go to Dr. Ramonita Lab. No home Health. No skilled facility. No home oxygen. No equipment in the home. Takes care of all basic activities of daily  Living himself. Prescriptions are filled at [place], Mail order per [employer] on [facility]. No falls Decreased appetite x 1 week. Lost weight, but not sure about how much weight. Wife will transport. States that there is a lot of support from family members.  Scheduled for ultrasound of the legs today. Shelbie Ammons RN MSN CCM Care Management

## 2016-09-12 NOTE — Progress Notes (Signed)
[facility] NAME: Louis Sanchez    MR#:  [id number]  DATE OF BIRTH:  [birth year]  SUBJECTIVE:  CHIEF COMPLAINT:   Chief Complaint  Patient presents with  . Nausea  . Shortness of Breath  . Night Sweats   The patient is 76 year old male with past medical history significant for history of colon neoplasm, arthritis, hypertension, hyperlipidemia, hypothyroidism, who presents to the hospital with nausea, shortness of breath, night sweats, dry cough. His labs revealed elevated transaminases. 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 CT of abdomen and pelvis revealed the liver and spleen, masses, MRI of abdomen with and without contrast showed similar abnormalities, of unclear etiology, multifocal hepatocellular carcinoma was of concern. Patient admits of feeling better today, stronger. Ammonia level was normal.   Review of Systems  Constitutional: Positive for malaise/fatigue. Negative for chills, fever and weight loss.  HENT: Negative for congestion.   Eyes: Negative for blurred vision and double vision.  Respiratory: Positive for cough. Negative for sputum production, shortness of breath and wheezing.   Cardiovascular: Negative for chest pain, palpitations, orthopnea, leg swelling and PND.  Gastrointestinal: Positive for nausea. Negative for abdominal pain, blood in stool, constipation, diarrhea and vomiting.  Genitourinary: Negative for dysuria, frequency, hematuria and urgency.  Musculoskeletal: Negative for falls.  Skin: Positive for rash.  Neurological: Negative for dizziness, tremors, focal weakness and headaches.  Endo/Heme/Allergies: Does not bruise/bleed easily.  Psychiatric/Behavioral: Negative for depression. The patient does not have insomnia.     VITAL SIGNS: Blood pressure 130/79, pulse (!) 104, temperature 98.9 F (37.2 C), temperature source Oral, resp. rate 16, height '5\' 8"'$  (1.727 m), weight  74.8 kg (165 lb), SpO2 100 %.  PHYSICAL EXAMINATION:   GENERAL:  76 y.o.-year-old patient lying in the bed with no acute distress.  EYES: Pupils equal, round, reactive to light and accommodation. No scleral icterus. Extraocular muscles intact.  HEENT: Head atraumatic, normocephalic. Oropharynx and nasopharynx clear.  NECK:  Supple, no jugular venous distention. No thyroid enlargement, no tenderness.  LUNGS: Normal breath sounds bilaterally, no wheezing, few scattered rales,rhonchi , but no crepitations noted. No use of accessory muscles of respiration.  CARDIOVASCULAR: S1, S2 normal. No murmurs, rubs, or gallops.  ABDOMEN: Soft, nontender, nondistended. Bowel sounds present. No organomegaly or mass.  EXTREMITIES: Trace lower extremity and pedal edema, no cyanosis, or clubbing.  NEUROLOGIC: Cranial nerves II through XII are intact. Muscle strength 5/5 in all extremities. Sensation intact. Gait not checked.  PSYCHIATRIC: The patient is alert and oriented x 3.  SKIN: No obvious rash, lesion, or ulcer. Confluent purpuric/petechial rash in lower extremities was noted, no blanching  ORDERS/RESULTS REVIEWED:   CBC  Recent Labs Lab 09/11/16 1440 09/12/16 0416  WBC 12.3* 10.0  HGB 13.9 13.3  HCT 42.0 39.8*  PLT 156 135*  MCV 91.2 90.8  MCH 30.1 30.3  MCHC 33.0 33.3  RDW 14.7* 14.8*   ------------------------------------------------------------------------------------------------------------------  Chemistries   Recent Labs Lab 09/11/16 1440 09/11/16 1808 09/12/16 0416 09/12/16 1411  NA 137  --  137  --   K 4.0  --  3.8  --   CL 102  --  104  --   CO2 27  --  26  --   GLUCOSE 101*  --  156*  --   BUN 17  --  15  --   CREATININE 0.75  --  0.76  --   CALCIUM 8.8*  --  8.3*  --   AST  --  69*  --  61*  ALT  --  148*  --  119*  ALKPHOS  --  346*  --  345*  BILITOT  --  0.7  --  0.8    ------------------------------------------------------------------------------------------------------------------ estimated creatinine clearance is 78.4 mL/min (by C-G formula based on SCr of 0.76 mg/dL). ------------------------------------------------------------------------------------------------------------------ No results for input(s): TSH, T4TOTAL, T3FREE, THYROIDAB in the last 72 hours.  Invalid input(s): FREET3  Cardiac Enzymes  Recent Labs Lab 09/11/16 1808  TROPONINI <0.03   ------------------------------------------------------------------------------------------------------------------ Invalid input(s): POCBNP ---------------------------------------------------------------------------------------------------------------  RADIOLOGY: Dg Chest 2 View  Result Date: 09/11/2016 CLINICAL DATA:  Acute onset of shortness of breath. Excessive perspiration. Initial encounter. EXAM: CHEST  2 VIEW COMPARISON:  None. FINDINGS: The lungs are well-aerated. Mild vascular congestion is noted. Mild bibasilar atelectasis is seen. Peribronchial thickening is noted. There is no evidence of pleural effusion or pneumothorax. The heart is borderline normal in size. No acute osseous abnormalities are seen. Cervical spinal fusion hardware is noted. IMPRESSION: Mild vascular congestion noted. Mild bibasilar atelectasis seen. Peribronchial thickening noted. Electronically Signed   By: Garald Balding M.D.   On: 09/11/2016 17:16   Ct Head Wo Contrast  Result Date: 09/11/2016 CLINICAL DATA:  Mental status changes for 1 week. EXAM: CT HEAD WITHOUT CONTRAST TECHNIQUE: Contiguous axial images were obtained from the base of the skull through the vertex without intravenous contrast. COMPARISON:  01/30/2014 FINDINGS: Brain: There is no evidence for acute hemorrhage, hydrocephalus, mass lesion, or abnormal extra-axial fluid collection. No definite CT evidence for acute infarction. Patchy low attenuation in the  deep hemispheric and periventricular white matter is nonspecific, but likely reflects chronic microvascular ischemic demyelination. Vascular: No hyperdense vessel or unexpected calcification. Skull: No evidence for fracture. No worrisome lytic or sclerotic lesion. Sinuses/Orbits: No acute finding.The visualized paranasal sinuses and mastoid air cells are clear. Visualized portions of the globes and intraorbital fat are unremarkable. Other: None. IMPRESSION: 1. No acute intracranial abnormality. 2. Chronic small vessel white matter ischemic disease. Electronically Signed   By: Misty Stanley M.D.   On: 09/11/2016 18:00   Mr Brain Wo Contrast  Result Date: 09/11/2016 CLINICAL DATA:  Initial evaluation for acute altered mental status. Palpable purpura. Evaluate for stroke or vasculitis. EXAM: MRI HEAD WITHOUT CONTRAST TECHNIQUE: Multiplanar, multiecho pulse sequences of the brain and surrounding structures were obtained without intravenous contrast. COMPARISON:  Prior CT from earlier same day. Comparison also made with prior MRI from 01/31/2014. FINDINGS: Brain: Study mildly degraded by motion artifact. Diffuse prominence of the CSF containing spaces is compatible with generalized age-related cerebral atrophy. Patchy and confluent T2/FLAIR hyperintensity within the periventricular, deep, and subcortical white matter both cerebral hemispheres. Patchy T2/FLAIR abnormality present within the pons as well. Changes most like related to chronic microvascular ischemic disease, moderate nature. Overall, changes are similar to previous. No abnormal foci of restricted diffusion to suggest acute or subacute ischemia. Gray-white matter differentiation maintained. No evidence for acute or chronic intracranial hemorrhage. No cortical encephalomalacia to suggest prior cortical infarction. No mass lesion, midline shift, or mass effect. Ventricles within normal limits for size without hydrocephalus. No extra-axial fluid collection.  Major dural sinuses are grossly patent. Pituitary gland and suprasellar region within normal limits. Vascular: Major intracranial vascular flow voids are maintained. Skull and upper cervical spine: Craniocervical junction within normal limits. Mild degenerative spondylolysis noted within the upper cervical spine without significant stenosis. Bone marrow signal intensity within normal limits. No scalp soft  tissue abnormality. Sinuses/Orbits: Globes and orbital soft tissues within normal limits. Scattered mucosal thickening within the ethmoidal air cells and right maxillary sinus. Paranasal sinuses are otherwise clear. Trace opacity bilateral mastoid air cells, of doubtful significance. Inner ear structures grossly normal. IMPRESSION: 1. No acute intracranial infarct or other process identified. 2. Moderate chronic small vessel ischemic changes involving the cerebral hemispheres and brainstem, similar to previous. Electronically Signed   By: Jeannine Boga M.D.   On: 09/11/2016 23:10   Mr Abdomen W Wo Contrast  Result Date: 09/12/2016 CLINICAL DATA:  76 year old male with multiple indeterminate lesions noted on recent CT examination. Further evaluation to exclude malignancy. EXAM: MRI ABDOMEN WITHOUT AND WITH CONTRAST TECHNIQUE: Multiplanar multisequence MR imaging of the abdomen was performed both before and after the administration of intravenous contrast. CONTRAST:  22m MULTIHANCE GADOBENATE DIMEGLUMINE 529 MG/ML IV SOLN COMPARISON:  No prior abdominal MRI. CT the abdomen and pelvis 09/12/2016. FINDINGS: Lower chest: Small bilateral pleural effusions lying dependently. Cardiomegaly. Increased T2 signal intensity in a lace-like network throughout the visualize lung bases suggestive of underlying edema. Hepatobiliary: There are several T2 hyperintense lesions scattered throughout the liver. The smallest of these lesions are only well visualized on T2 weighted sequences and measure up to 8 mm in diameter  (largest is on image 11 of series 6) in segment 8. However, the largest T2 hyperintense lesion is in the central aspect of segment 4A. This lesion, and another lesion which is essentially occult on T2 weighted sequences in segment 6/7 both demonstrate similar enhancement characteristics; specifically, these lesions appear very subtly hypovascular on early phase post gadolinium imaging with progressive washout on delayed phase imaging. These lesions measure 3.5 x 3.0 cm in segment 4A centrally (image 20 of series 20) and 4.2 x 2.4 cm in segments 6/7 (image 32 of series 20). Notably, both of these lesions appear to demonstrate some mild diffusion restriction on diffusion-weighted imaging. The liver does have a very subtle nodular contour, which could indicate early changes of cirrhosis. No intra or extrahepatic biliary ductal dilatation. Gallbladder is unremarkable in appearance. Pancreas: No pancreatic mass or peripancreatic inflammatory changes. No pancreatic ductal dilatation. Spleen: Spleen appears borderline enlarged measuring 4.9 x 12.5 x 13.1 cm (estimated splenic volume of 402 mL). Adrenals/Urinary Tract: Bilateral kidneys and bilateral adrenal glands are normal in appearance. There is no hydroureteronephrosis in the visualized abdomen. Stomach/Bowel: Visualized portions are unremarkable. Vascular/Lymphatic: Atherosclerosis in the abdominal vasculature, without evidence of aneurysm. No lymphadenopathy noted in the abdomen. Other: No significant volume of ascites noted in the visualized portions of the peritoneal cavity. Musculoskeletal: No aggressive osseous lesions are noted in the visualized portions of the skeleton. IMPRESSION: 1. The liver lesions noted on recent CT examination are indeterminate. The largest of these lesions in segment 4A centrally, and between segments 6/7 demonstrate very unusual imaging characteristics. Given slight nodularity of the liver, mild diffusion restriction of the lesions, and  the enhancement characteristics, the possibility of multifocal hepatocellular carcinoma should be considered. Further evaluation with percutaneous biopsy is suggested if clinically appropriate at this time. 2. The appearance of the lung bases suggests edema. Increasing small bilateral pleural effusions. 3. Borderline splenomegaly. 4. Aortic atherosclerosis. Electronically Signed   By: DVinnie LangtonM.D.   On: 09/12/2016 15:05   Ct Abdomen Pelvis W Contrast  Result Date: 09/12/2016 CLINICAL DATA:  76year old male with history of AFib and hypertension presenting with nausea and weakness. EXAM: CT ABDOMEN AND PELVIS WITH CONTRAST TECHNIQUE: Multidetector CT imaging of  the abdomen and pelvis was performed using the standard protocol following bolus administration of intravenous contrast. CONTRAST:  135m ISOVUE-300 IOPAMIDOL (ISOVUE-300) INJECTION 61% COMPARISON:  Abdominal ultrasound dated 09/11/2016 FINDINGS: Evaluation of this exam is limited due to respiratory motion artifact. Lower chest: Bibasilar streaky densities most consistent with atelectasis/ scarring. There is mild cardiomegaly with mild dilatation of the right cardiac chambers. There is mild pectus excavatum deformity of. No intra-abdominal free air or free fluid. Hepatobiliary: There is apparent mild fatty infiltration of the liver. Multiple ill-defined liver lesions noted measuring up to 2.1 x 1.4 cm in the inferior aspect of the right lobe of the liver anteriorly. A 1.2 x 1.3 cm partially calcified lesion in the right lobe of the liver more centrally (series 2, image 17) noted. These lesions are not characterized on this CT. Further evaluation with MRI without and with contrast is recommended. There is no intrahepatic biliary ductal dilatation. The gallbladder is unremarkable. Pancreas: Unremarkable. No pancreatic ductal dilatation or surrounding inflammatory changes. Spleen: Multiple faintly visualized splenic hypodense lesions measuring up to  1.3 cm in the superior pole are indeterminate but may represent cysts or hemangioma. Adrenals/Urinary Tract: The adrenal glands appear unremarkable. A 1 cm focal cortical indentation and non enhancement involving the interpolar aspect of the right kidney is indeterminate but likely represents an area of scarring cor a small renal cyst. The kidneys are otherwise unremarkable. There is no hydronephrosis on either side. The visualized ureters and urinary bladder appear unremarkable. Stomach/Bowel: Oral contrast opacifies loops of small bowel and traverses into the colon without evidence of bowel obstruction. No active inflammatory changes of the bowel noted. The appendix is not visualized with certainty. No inflammatory changes identified in the right lower quadrant. Vascular/Lymphatic: There is moderate aortoiliac atherosclerotic disease. The origins of the celiac axis, SMA, IMA as well as the origins of the renal arteries are patent. The SMV, splenic vein, and main portal vein are patent. No portal venous gas identified. There is no adenopathy. Reproductive: The prostate and seminal vesicles are grossly unremarkable. Other: Small fat containing umbilical hernia. Small right posterior diaphragmatic defect with herniation of small amount of abdominal fat. Probable right inguinal hernia repair Musculoskeletal: Mild degenerative changes of the spine. No acute fracture. IMPRESSION: 1. Multiple hepatic lesions which are ill-defined and incompletely characterized on the CT. Further characterization with MRI is recommended. 2. Faint splenic hypodense lesions, incompletely characterized, possibly versus hemangioma. 3. No evidence of bowel obstruction or active inflammation. 4. Mild cardiomegaly head with enlargement of right cardiac chambers. Electronically Signed   By: AAnner CreteM.D.   On: 09/12/2016 02:26   UKoreaVenous Img Lower Bilateral  Result Date: 09/12/2016 CLINICAL DATA:  Lower extremity edema bilaterally  EXAM: BILATERAL LOWER EXTREMITY VENOUS DUPLEX ULTRASOUND TECHNIQUE: Gray-scale sonography with graded compression, as well as color Doppler and duplex ultrasound were performed to evaluate the lower extremity deep venous systems from the level of the common femoral vein and including the common femoral, femoral, profunda femoral, popliteal and calf veins including the posterior tibial, peroneal and gastrocnemius veins when visible. The superficial great saphenous vein was also interrogated. Spectral Doppler was utilized to evaluate flow at rest and with distal augmentation maneuvers in the common femoral, femoral and popliteal veins. COMPARISON:  None. FINDINGS: RIGHT LOWER EXTREMITY Common Femoral Vein: No evidence of thrombus. Normal compressibility, respiratory phasicity and response to augmentation. Saphenofemoral Junction: No evidence of thrombus. Normal compressibility and flow on color Doppler imaging. Profunda Femoral Vein: No evidence  of thrombus. Normal compressibility and flow on color Doppler imaging. Femoral Vein: No evidence of thrombus. Normal compressibility, respiratory phasicity and response to augmentation. Popliteal Vein: No evidence of thrombus. Normal compressibility, respiratory phasicity and response to augmentation. Calf Veins: No evidence of thrombus. Normal compressibility and flow on color Doppler imaging. Superficial Great Saphenous Vein: No evidence of thrombus. Normal compressibility and flow on color Doppler imaging. Venous Reflux:  None. Other Findings:  None. LEFT LOWER EXTREMITY Common Femoral Vein: No evidence of thrombus. Normal compressibility, respiratory phasicity and response to augmentation. Saphenofemoral Junction: No evidence of thrombus. Normal compressibility and flow on color Doppler imaging. Profunda Femoral Vein: No evidence of thrombus. Normal compressibility and flow on color Doppler imaging. Femoral Vein: No evidence of thrombus. Normal compressibility, respiratory  phasicity and response to augmentation. Popliteal Vein: No evidence of thrombus. Normal compressibility, respiratory phasicity and response to augmentation. Calf Veins: No evidence of thrombus. Normal compressibility and flow on color Doppler imaging. Superficial Great Saphenous Vein: No evidence of thrombus. Normal compressibility and flow on color Doppler imaging. Venous Reflux:  None. Other Findings:  None. IMPRESSION: No evidence of deep venous thrombosis in either lower extremity. Electronically Signed   By: Lowella Grip III M.D.   On: 09/12/2016 15:09   US Abdomen Limited Ruq  Result Date: 09/12/2016 CLINICAL DATA:  Initial evaluation for elevated LFTs, nausea, weakness. EXAM: US ABDOMEN LIMITED - RIGHT UPPER QUADRANT COMPARISON:  None. FINDINGS: Gallbladder: No gallstones or wall thickening visualized. No sonographic Murphy sign noted by sonographer. Common bile duct: Diameter: 2.9 mm Liver: Several well-circumscribed echogenic lesions were seen as follows semi: 1.3 x 0.9 x 1.5 cm lesion within the caudate lobe. Additional 2.8 x 3.7 x 3.4 cm lesion within the right hepatic lobe. Second 1.1 x 1.2 x 1.3 cm lesion present within the right hepatic lobe. Third right hepatic lobe lesion measured 3.8 x 2.4 x 3.6 cm. Underlying hepatic echotexture within normal limits. Trace right pleural effusion. IMPRESSION: 1. Normal sonographic appearance of the gallbladder. No biliary dilatation. 2. Multiple echogenic well-circumscribed lesions within the liver as above, indeterminate, but most characteristic for probable small hemangiomas. 3. Trace right pleural effusion. Electronically Signed   By: Jeannine Boga M.D.   On: 09/12/2016 00:01    EKG:  Orders placed or performed during the hospital encounter of 09/11/16  . ED EKG  . ED EKG  . ED EKG  . ED EKG    ASSESSMENT AND PLAN:  Active Problems:   Atrial fibrillation with RVR (HCC) #1 A. fib with RVR, improved. With conservative therapy,  continue atenolol, add Cardizem if needed #2. Nausea, supportive therapy, patient has been eating about 50% of offered a meals #3 liver masses, concerning for hepatocellular carcinoma, CEA and  AFP be ordered, appreciate gastroenterology's input, MRI of the abdomen with and without contrast did not give additional information #4 acute bronchitis, continue patient on antibiotics, nebulizing therapy, follow closely #5. 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 Leukocytosis, improved #7, coagulopathy, likely due to Xarelto #8. Venous stasis dermatitis, worsened by Xarelto use, supportive therapy  Management plans discussed with the patient, family and they are in agreement.   DRUG ALLERGIES: No Known Allergies  CODE STATUS:     Code Status Orders        Start     Ordered   09/11/16 2131  Full code  Continuous     09/11/16 2131    Code Status History  Date Active Date Inactive Code Status Order ID Comments User Context   This patient has a current code status but no historical code status.      TOTAL TIME TAKING CARE OF THIS PATIENT: 40 minutes.  Discussed with patient's wife and Dr. Allen Norris, all questions were answered  Paulyne Mooty M.D on 09/12/2016 at 4:08 PM  Between 7am to 6pm - Pager - [phone]  After 6pm go to [email or web address] - password [email or web address] [employer] [facility]  Office  [phone]  CC: Primary care physician; Denita Lung, DO

## 2016-09-12 NOTE — ED Notes (Signed)
Patient transported to CT 

## 2016-09-12 NOTE — Progress Notes (Signed)
PHARMACIST - PHYSICIAN ORDER COMMUNICATION  CONCERNING: P&T Medication Policy on Herbal Medications  DESCRIPTION:  This patient's order for:  Glucosamine-chondroitin  has been noted.  This product(s) is classified as an "herbal" or natural product. Due to a lack of definitive safety studies or FDA approval, nonstandard manufacturing practices, plus the potential risk of unknown drug-drug interactions while on inpatient medications, the Pharmacy and Therapeutics Committee does not permit the use of "herbal" or natural products of this type within [facility].   ACTION TAKEN: The pharmacy department is unable to verify this order at this time  Please reevaluate patient's clinical condition at discharge and address if the herbal or natural product(s) should be resumed at that time.

## 2016-09-13 DIAGNOSIS — I872 Venous insufficiency (chronic) (peripheral): Secondary | ICD-10-CM

## 2016-09-13 DIAGNOSIS — J209 Acute bronchitis, unspecified: Secondary | ICD-10-CM

## 2016-09-13 DIAGNOSIS — D72829 Elevated white blood cell count, unspecified: Secondary | ICD-10-CM

## 2016-09-13 DIAGNOSIS — R160 Hepatomegaly, not elsewhere classified: Secondary | ICD-10-CM

## 2016-09-13 LAB — HEPATIC FUNCTION PANEL
ALT: 102 U/L — AB (ref 17–63)
AST: 46 U/L — ABNORMAL HIGH (ref 15–41)
Albumin: 2.9 g/dL — ABNORMAL LOW (ref 3.5–5.0)
Alkaline Phosphatase: 324 U/L — ABNORMAL HIGH (ref 38–126)
BILIRUBIN DIRECT: 0.3 mg/dL (ref 0.1–0.5)
BILIRUBIN INDIRECT: 0.6 mg/dL (ref 0.3–0.9)
TOTAL PROTEIN: 6.2 g/dL — AB (ref 6.5–8.1)
Total Bilirubin: 0.9 mg/dL (ref 0.3–1.2)

## 2016-09-13 LAB — IGG, IGA, IGM
IGA: 214 mg/dL (ref 61–437)
IGG (IMMUNOGLOBIN G), SERUM: 746 mg/dL (ref 700–1600)
IgM, Serum: 68 mg/dL (ref 15–143)

## 2016-09-13 LAB — ANTI-SMOOTH MUSCLE ANTIBODY, IGG: F-ACTIN AB IGG: 9 U (ref 0–19)

## 2016-09-13 LAB — HEPATITIS PANEL, ACUTE
HEP B S AG: NEGATIVE
Hep A IgM: NEGATIVE
Hep B C IgM: NEGATIVE

## 2016-09-13 LAB — GLUCOSE, CAPILLARY: GLUCOSE-CAPILLARY: 91 mg/dL (ref 65–99)

## 2016-09-13 LAB — AFP TUMOR MARKER: AFP-Tumor Marker: 1.5 ng/mL (ref 0.0–8.3)

## 2016-09-13 LAB — CERULOPLASMIN: Ceruloplasmin: 37.8 mg/dL — ABNORMAL HIGH (ref 16.0–31.0)

## 2016-09-13 LAB — CEA: CEA: 1.4 ng/mL (ref 0.0–4.7)

## 2016-09-13 LAB — MITOCHONDRIAL ANTIBODIES: Mitochondrial M2 Ab, IgG: 77.1 Units — ABNORMAL HIGH (ref 0.0–20.0)

## 2016-09-13 LAB — ANTINUCLEAR ANTIBODIES, IFA: ANA Ab, IFA: NEGATIVE

## 2016-09-13 MED ORDER — PANTOPRAZOLE SODIUM 40 MG PO TBEC
40.0000 mg | DELAYED_RELEASE_TABLET | Freq: Every day | ORAL | Status: DC
  Administered 2016-09-13: 40 mg via ORAL
  Filled 2016-09-13: qty 1

## 2016-09-13 MED ORDER — ONDANSETRON HCL 4 MG PO TABS: 4.0000 mg | ORAL_TABLET | Freq: Four times a day (QID) | ORAL | 0 refills | Status: DC | PRN

## 2016-09-13 MED ORDER — SPIRONOLACTONE 50 MG PO TABS: 50.0000 mg | ORAL_TABLET | Freq: Two times a day (BID) | ORAL | 6 refills | Status: DC

## 2016-09-13 MED ORDER — LEVOFLOXACIN 750 MG PO TABS: 750.0000 mg | ORAL_TABLET | Freq: Every day | ORAL | 0 refills | Status: DC

## 2016-09-13 MED ORDER — SUCRALFATE 1 G PO TABS: 1.0000 g | ORAL_TABLET | Freq: Three times a day (TID) | ORAL | 0 refills | Status: DC

## 2016-09-13 MED ORDER — PANTOPRAZOLE SODIUM 40 MG PO TBEC: 40.0000 mg | DELAYED_RELEASE_TABLET | Freq: Every day | ORAL | 5 refills | Status: DC

## 2016-09-13 NOTE — Care Management Important Message (Signed)
Important Message  Patient Details  Name: SAAGAR TORTORELLA MRN: [id number] Date of Birth: [birth year]   Medicare Important Message Given:  Yes  Initial signed IM printed from Epic and given to patient.      Katrina Stack, RN 09/13/2016, 11:43 AM

## 2016-09-13 NOTE — Plan of Care (Signed)
Problem: Health Behavior/Discharge Planning: Goal: Ability to manage health-related needs will improve Outcome: Completed/Met Date Met: 09/13/16 Discharge instructions regarding medication changes, liver failure, and need to STOP drinking or his health will deteriorate further.  Resources provided regarding drinking cessation.   

## 2016-09-13 NOTE — Discharge Summary (Signed)
[facility] [place] NAME: Louis Sanchez    MR#:  [id number]  DATE OF BIRTH:  [birth year]  DATE OF ADMISSION:  09/11/2016 ADMITTING PHYSICIAN: Epifanio Lesches, MD  DATE OF DISCHARGE: No discharge date for patient encounter.  PRIMARY CARE PHYSICIAN: KLEIN,KOMBIZ, DO     ADMISSION DIAGNOSIS:  Purpura ([place]) [D69.2] Elevated LFTs [R79.89] SIRS (systemic inflammatory response syndrome) (HCC) [R65.10] Acute encephalopathy [G93.40] LFT elevation [R79.89] Dyspnea, unspecified type [R06.00]  DISCHARGE DIAGNOSIS:  Active Problems:   Atrial fibrillation with RVR (HCC)   LFT elevation   Alcoholic gastritis   Liver masses   Acute bronchitis   Leukocytosis   Venous stasis dermatitis   SECONDARY DIAGNOSIS:   Past Medical History:  Diagnosis Date  . Arthritis   . Benign neoplasm of colon 2004  . 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 Heart disease   . Hypertension   . Other and unspecified hyperlipidemia 1997  . Other specified disorder of male genital organs(608.89) 214  . Sleep disturbance, unspecified   . Thyroid disease   . Unspecified hypothyroidism 2005    .pro HOSPITAL COURSE:  The patient is 76year old male with past medical history significant for history of Benign colon neoplasm, arthritis, hypertension, hyperlipidemia, hypothyroidism, heavy alcohol abuse, who presents to the hospital with nausea, shortness of breath, night sweats, dry cough. His labs revealed elevated transaminase, alkaline phosphatase.  CT of abdomen and pelvis revealed the liver and spleen masses.  Chest x-ray revealed mild vascular congestion.  MRI of abdomen with and without contrast showed similar abnormalities, of unclear etiology, multifocal hepatocellular carcinoma was of concern.  Ammonia level was normal. The patient was seen by gastroenterologist, who felt that his liver enzyme elevation may have  been related to alcohol, statin. With conservative therapy. Patient's liver enzymes improved. Patient had serologic studies done, including AFP, CEA, which were normal. The patient was advised to stop alcohol abuse. The patient was recommended to follow up with gastroenterologist as outpatient for further recommendations. Since patient had lower extremity swelling, he is CT and MRI of abdomen revealed pleural effusions, mild fluid retention, spironolactone was initiated. Patient had intermittent nausea and vomiting while in the hospital, which improved with downgrade in his diet. Patient is advised to continue PPI and Carafate, stop alcohol abuse. He was felt to be stable to be discharged home today. Discussion by problem:  #1 A. fib with RVR, improved with conservative therapy, continue atenolol, patient's heart rate remained stable between 80s to 100s. The patient is to continue Xarelto.  #2. Nausea, supportive therapy with PPI, Carafate was advised, patient was advised to stop drinking alcohol, downgrade his diet and advance it slowly as tolerated, he was advised to follow-up with primary care physician and gastroenterologist as outpatient for further recommendations. #3 liver masses,  CEA and  AFP were normal, MRI of the abdomen with and without contrast did not give additional information, patient is to follow up with gastroenterologist for further recommendations #4 acute bronchitis, continue patient on antibiotics to complete course, nebulizing therapy, clinically improved #5. Elevated transaminases level, chronic, likely alcohol related, per gastroenterologist, serologic studies are to be reviewed by gastroenterologist as outpatient #6. Leukocytosis, improved #7, coagulopathy, likely due to Xarelto #8. Venous stasis dermatitis, worsened by Xarelto use, supportive therapy only    DISCHARGE CONDITIONS:   Stable, guarded  CONSULTS OBTAINED:  Treatment Team:  DLucilla Lame MD  DRUG ALLERGIES:  No Known Allergies  DISCHARGE MEDICATIONS:   Current Discharge Medication List    START taking these medications   Details  levofloxacin (LEVAQUIN) 750 MG tablet Take 1 tablet (750 mg total) by mouth daily. Qty: 4 tablet, Refills: 0    ondansetron (ZOFRAN) 4 MG tablet Take 1 tablet (4 mg total) by mouth every 6 (six) hours as needed for nausea. Qty: 20 tablet, Refills: 0    pantoprazole (PROTONIX) 40 MG tablet Take 1 tablet (40 mg total) by mouth daily. Qty: 30 tablet, Refills: 5    spironolactone (ALDACTONE) 50 MG tablet Take 1 tablet (50 mg total) by mouth 2 (two) times daily. Qty: 60 tablet, Refills: 6    sucralfate (CARAFATE) 1 g tablet Take 1 tablet (1 g total) by mouth 4 (four) times daily -  with meals and at bedtime. Qty: 90 tablet, Refills: 0      CONTINUE these medications which have NOT CHANGED   Details  atenolol (TENORMIN) 25 MG tablet Take 0.5 tablets by mouth every evening.    B Complex-C (B-COMPLEX WITH VITAMIN C) tablet Take 1 tablet by mouth daily.    fish oil-omega-3 fatty acids 1000 MG capsule Take 2 g by mouth daily.    glucosamine-chondroitin 500-400 MG tablet Take 1 tablet by mouth 3 (three) times daily.    Multiple Vitamin (MULTIVITAMIN) tablet Take 1 tablet by mouth daily.    rivaroxaban (XARELTO) 20 MG TABS tablet Take 20 mg by mouth daily with supper.    rosuvastatin (CRESTOR) 10 MG tablet Take 10 mg by mouth daily.    sertraline (ZOLOFT) 100 MG tablet Take 1 tablet by mouth daily.    SYNTHROID 50 MCG tablet Take 1 tablet by mouth daily.    vitamin C (ASCORBIC ACID) 500 MG tablet Take 500 mg by mouth daily.    vitamin E 100 UNIT capsule Take 400 Units by mouth daily.      STOP taking these medications     aspirin 81 MG tablet      calcium carbonate (OS-CAL) 600 MG TABS          DISCHARGE INSTRUCTIONS:    The patient is to follow-up with primary care physician, gastroenterologist as outpatient  If you experience worsening of  your admission symptoms, develop shortness of breath, life threatening emergency, suicidal or homicidal thoughts you must seek medical attention immediately by calling 911 or calling your MD immediately  if symptoms less severe.  You Must read complete instructions/literature along with all the possible adverse reactions/side effects for all the Medicines you take and that have been prescribed to you. Take any new Medicines after you have completely understood and accept all the possible adverse reactions/side effects.   Please note  You were cared for by a hospitalist during your hospital stay. If you have any questions about your discharge medications or the care you received while you were in the hospital after you are discharged, you can call the unit and asked to speak with the hospitalist on call if the hospitalist that took care of you is not available. Once you are discharged, your primary care physician will handle any further medical issues. Please note that NO REFILLS for any discharge medications will be authorized once you are discharged, as it is imperative that you return to your primary care physician (or establish a relationship with a primary care physician if you do not have one) for your aftercare needs so that they can reassess your need for medications  and monitor your lab values.    Today   CHIEF COMPLAINT:   Chief Complaint  Patient presents with  . Nausea  . Shortness of Breath  . Night Sweats    HISTORY OF PRESENT ILLNESS:  Louis Sanchez  is a 76 y.o. male with a known history of Benign colon neoplasm, arthritis, hypertension, hyperlipidemia, hypothyroidism, heavy alcohol abuse, who presents to the hospital with nausea, shortness of breath, night sweats, dry cough. His labs revealed elevated transaminase, alkaline phosphatase.  CT of abdomen and pelvis revealed the liver and spleen masses.  Chest x-ray revealed mild vascular congestion.  MRI of abdomen with and without  contrast showed similar abnormalities, of unclear etiology, multifocal hepatocellular carcinoma was of concern.  Ammonia level was normal. The patient was seen by gastroenterologist, who felt that his liver enzyme elevation may have been related to alcohol, statin. With conservative therapy. Patient's liver enzymes improved. Patient had serologic studies done, including AFP, CEA, which were normal. The patient was advised to stop alcohol abuse. The patient was recommended to follow up with gastroenterologist as outpatient for further recommendations. Since patient had lower extremity swelling, he is CT and MRI of abdomen revealed pleural effusions, mild fluid retention, spironolactone was initiated. Patient had intermittent nausea and vomiting while in the hospital, which improved with downgrade in his diet. Patient is advised to continue PPI and Carafate, stop alcohol abuse. He was felt to be stable to be discharged home today. Discussion by problem:  #1 A. fib with RVR, improved with conservative therapy, continue atenolol, patient's heart rate remained stable between 80s to 100s. The patient is to continue Xarelto.  #2. Nausea, supportive therapy with PPI, Carafate was advised, patient was advised to stop drinking alcohol, downgrade his diet and advance it slowly as tolerated, he was advised to follow-up with primary care physician and gastroenterologist as outpatient for further recommendations. #3 liver masses,  CEA and  AFP were normal, MRI of the abdomen with and without contrast did not give additional information, patient is to follow up with gastroenterologist for further recommendations #4 acute bronchitis, continue patient on antibiotics to complete course, nebulizing therapy, clinically improved #5. Elevated transaminases level, chronic, likely alcohol related, per gastroenterologist, serologic studies are to be reviewed by gastroenterologist as outpatient #6. Leukocytosis, improved #7,  coagulopathy, likely due to Xarelto #8. Venous stasis dermatitis, worsened by Xarelto use, supportive therapy only  VITAL SIGNS:  Blood pressure (!) 141/90, pulse 83, temperature 98.4 F (36.9 C), temperature source Oral, resp. rate 18, height '5\' 8"'$  (1.727 m), weight 75.8 kg (167 lb 1.6 oz), SpO2 96 %.  I/O:   Intake/Output Summary (Last 24 hours) at 09/13/16 1635 Last data filed at 09/13/16 1438  Gross per 24 hour  Intake             1290 ml  Output              700 ml  Net              590 ml    PHYSICAL EXAMINATION:  GENERAL:  76 y.o.-year-old patient lying in the bed with no acute distress.  EYES: Pupils equal, round, reactive to light and accommodation. No scleral icterus. Extraocular muscles intact.  HEENT: Head atraumatic, normocephalic. Oropharynx and nasopharynx clear.  NECK:  Supple, no jugular venous distention. No thyroid enlargement, no tenderness.  LUNGS: Normal breath sounds bilaterally, no wheezing, rales,rhonchi or crepitation. No use of accessory muscles of respiration.  CARDIOVASCULAR: S1, S2 ,  irregularly irregular. No murmurs, rubs, or gallops.  ABDOMEN: Soft, non-tender, non-distended. Bowel sounds present. No organomegaly or mass.  EXTREMITIES: No pedal edema, cyanosis, or clubbing.  NEUROLOGIC: Cranial nerves II through XII are intact. Muscle strength 5/5 in all extremities. Sensation intact. Gait not checked.  PSYCHIATRIC: The patient is alert and oriented x 3.  SKIN: No obvious rash, lesion, or ulcer.   DATA REVIEW:   CBC  Recent Labs Lab 09/12/16 0416  WBC 10.0  HGB 13.3  HCT 39.8*  PLT 135*    Chemistries   Recent Labs Lab 09/12/16 0416  09/13/16 0355  NA 137  --   --   K 3.8  --   --   CL 104  --   --   CO2 26  --   --   GLUCOSE 156*  --   --   BUN 15  --   --   CREATININE 0.76  --   --   CALCIUM 8.3*  --   --   AST  --   < > 46*  ALT  --   < > 102*  ALKPHOS  --   < > 324*  BILITOT  --   < > 0.9  < > = values in this interval  not displayed.  Cardiac Enzymes  Recent Labs Lab 09/11/16 1808  TROPONINI <0.03    Microbiology Results  Results for orders placed or performed during the hospital encounter of 09/11/16  Blood culture (routine x 2)     Status: None (Preliminary result)   Collection Time: 09/11/16  9:02 PM  Result Value Ref Range Status   Specimen Description BLOOD LEFT ASSIST CONTROL  Final   Special Requests   Final    BOTTLES DRAWN AEROBIC AND ANAEROBIC  AEROBIC [place], ANAEROBIC 6CC   Culture NO GROWTH 2 DAYS  Final   Report Status PENDING  Incomplete  Blood culture (routine x 2)     Status: None (Preliminary result)   Collection Time: 09/11/16  9:02 PM  Result Value Ref Range Status   Specimen Description BLOOD RIGHT HAND  Final   Special Requests   Final    BOTTLES DRAWN AEROBIC AND ANAEROBIC  AEROBIC 3CC, ANAEROBIC 4CC   Culture NO GROWTH 2 DAYS  Final   Report Status PENDING  Incomplete    RADIOLOGY:  Dg Chest 2 View  Result Date: 09/11/2016 CLINICAL DATA:  Acute onset of shortness of breath. Excessive perspiration. Initial encounter. EXAM: CHEST  2 VIEW COMPARISON:  None. FINDINGS: The lungs are well-aerated. Mild vascular congestion is noted. Mild bibasilar atelectasis is seen. Peribronchial thickening is noted. There is no evidence of pleural effusion or pneumothorax. The heart is borderline normal in size. No acute osseous abnormalities are seen. Cervical spinal fusion hardware is noted. IMPRESSION: Mild vascular congestion noted. Mild bibasilar atelectasis seen. Peribronchial thickening noted. Electronically Signed   By: Garald Balding M.D.   On: 09/11/2016 17:16   Ct Head Wo Contrast  Result Date: 09/11/2016 CLINICAL DATA:  Mental status changes for 1 week. EXAM: CT HEAD WITHOUT CONTRAST TECHNIQUE: Contiguous axial images were obtained from the base of the skull through the vertex without intravenous contrast. COMPARISON:  01/30/2014 FINDINGS: Brain: There is no evidence for acute  hemorrhage, hydrocephalus, mass lesion, or abnormal extra-axial fluid collection. No definite CT evidence for acute infarction. Patchy low attenuation in the deep hemispheric and periventricular white matter is nonspecific, but likely reflects chronic microvascular ischemic demyelination. Vascular: No hyperdense  vessel or unexpected calcification. Skull: No evidence for fracture. No worrisome lytic or sclerotic lesion. Sinuses/Orbits: No acute finding.The visualized paranasal sinuses and mastoid air cells are clear. Visualized portions of the globes and intraorbital fat are unremarkable. Other: None. IMPRESSION: 1. No acute intracranial abnormality. 2. Chronic small vessel white matter ischemic disease. Electronically Signed   By: Misty Stanley M.D.   On: 09/11/2016 18:00   Mr Brain Wo Contrast  Result Date: 09/11/2016 CLINICAL DATA:  Initial evaluation for acute altered mental status. Palpable purpura. Evaluate for stroke or vasculitis. EXAM: MRI HEAD WITHOUT CONTRAST TECHNIQUE: Multiplanar, multiecho pulse sequences of the brain and surrounding structures were obtained without intravenous contrast. COMPARISON:  Prior CT from earlier same day. Comparison also made with prior MRI from 01/31/2014. FINDINGS: Brain: Study mildly degraded by motion artifact. Diffuse prominence of the CSF containing spaces is compatible with generalized age-related cerebral atrophy. Patchy and confluent T2/FLAIR hyperintensity within the periventricular, deep, and subcortical white matter both cerebral hemispheres. Patchy T2/FLAIR abnormality present within the pons as well. Changes most like related to chronic microvascular ischemic disease, moderate nature. Overall, changes are similar to previous. No abnormal foci of restricted diffusion to suggest acute or subacute ischemia. Gray-white matter differentiation maintained. No evidence for acute or chronic intracranial hemorrhage. No cortical encephalomalacia to suggest prior  cortical infarction. No mass lesion, midline shift, or mass effect. Ventricles within normal limits for size without hydrocephalus. No extra-axial fluid collection. Major dural sinuses are grossly patent. Pituitary gland and suprasellar region within normal limits. Vascular: Major intracranial vascular flow voids are maintained. Skull and upper cervical spine: Craniocervical junction within normal limits. Mild degenerative spondylolysis noted within the upper cervical spine without significant stenosis. Bone marrow signal intensity within normal limits. No scalp soft tissue abnormality. Sinuses/Orbits: Globes and orbital soft tissues within normal limits. Scattered mucosal thickening within the ethmoidal air cells and right maxillary sinus. Paranasal sinuses are otherwise clear. Trace opacity bilateral mastoid air cells, of doubtful significance. Inner ear structures grossly normal. IMPRESSION: 1. No acute intracranial infarct or other process identified. 2. Moderate chronic small vessel ischemic changes involving the cerebral hemispheres and brainstem, similar to previous. Electronically Signed   By: Jeannine Boga M.D.   On: 09/11/2016 23:10   Mr Abdomen W Wo Contrast  Result Date: 09/12/2016 CLINICAL DATA:  76 year old male with multiple indeterminate lesions noted on recent CT examination. Further evaluation to exclude malignancy. EXAM: MRI ABDOMEN WITHOUT AND WITH CONTRAST TECHNIQUE: Multiplanar multisequence MR imaging of the abdomen was performed both before and after the administration of intravenous contrast. CONTRAST:  75m MULTIHANCE GADOBENATE DIMEGLUMINE 529 MG/ML IV SOLN COMPARISON:  No prior abdominal MRI. CT the abdomen and pelvis 09/12/2016. FINDINGS: Lower chest: Small bilateral pleural effusions lying dependently. Cardiomegaly. Increased T2 signal intensity in a lace-like network throughout the visualize lung bases suggestive of underlying edema. Hepatobiliary: There are several T2  hyperintense lesions scattered throughout the liver. The smallest of these lesions are only well visualized on T2 weighted sequences and measure up to 8 mm in diameter (largest is on image 11 of series 6) in segment 8. However, the largest T2 hyperintense lesion is in the central aspect of segment 4A. This lesion, and another lesion which is essentially occult on T2 weighted sequences in segment 6/7 both demonstrate similar enhancement characteristics; specifically, these lesions appear very subtly hypovascular on early phase post gadolinium imaging with progressive washout on delayed phase imaging. These lesions measure 3.5 x 3.0 cm in segment 4A centrally (  image 20 of series 20) and 4.2 x 2.4 cm in segments 6/7 (image 32 of series 20). Notably, both of these lesions appear to demonstrate some mild diffusion restriction on diffusion-weighted imaging. The liver does have a very subtle nodular contour, which could indicate early changes of cirrhosis. No intra or extrahepatic biliary ductal dilatation. Gallbladder is unremarkable in appearance. Pancreas: No pancreatic mass or peripancreatic inflammatory changes. No pancreatic ductal dilatation. Spleen: Spleen appears borderline enlarged measuring 4.9 x 12.5 x 13.1 cm (estimated splenic volume of 402 mL). Adrenals/Urinary Tract: Bilateral kidneys and bilateral adrenal glands are normal in appearance. There is no hydroureteronephrosis in the visualized abdomen. Stomach/Bowel: Visualized portions are unremarkable. Vascular/Lymphatic: Atherosclerosis in the abdominal vasculature, without evidence of aneurysm. No lymphadenopathy noted in the abdomen. Other: No significant volume of ascites noted in the visualized portions of the peritoneal cavity. Musculoskeletal: No aggressive osseous lesions are noted in the visualized portions of the skeleton. IMPRESSION: 1. The liver lesions noted on recent CT examination are indeterminate. The largest of these lesions in segment 4A  centrally, and between segments 6/7 demonstrate very unusual imaging characteristics. Given slight nodularity of the liver, mild diffusion restriction of the lesions, and the enhancement characteristics, the possibility of multifocal hepatocellular carcinoma should be considered. Further evaluation with percutaneous biopsy is suggested if clinically appropriate at this time. 2. The appearance of the lung bases suggests edema. Increasing small bilateral pleural effusions. 3. Borderline splenomegaly. 4. Aortic atherosclerosis. Electronically Signed   By: Vinnie Langton M.D.   On: 09/12/2016 15:05   Ct Abdomen Pelvis W Contrast  Result Date: 09/12/2016 CLINICAL DATA:  76 year old male with history of AFib and hypertension presenting with nausea and weakness. EXAM: CT ABDOMEN AND PELVIS WITH CONTRAST TECHNIQUE: Multidetector CT imaging of the abdomen and pelvis was performed using the standard protocol following bolus administration of intravenous contrast. CONTRAST:  170m ISOVUE-300 IOPAMIDOL (ISOVUE-300) INJECTION 61% COMPARISON:  Abdominal ultrasound dated 09/11/2016 FINDINGS: Evaluation of this exam is limited due to respiratory motion artifact. Lower chest: Bibasilar streaky densities most consistent with atelectasis/ scarring. There is mild cardiomegaly with mild dilatation of the right cardiac chambers. There is mild pectus excavatum deformity of. No intra-abdominal free air or free fluid. Hepatobiliary: There is apparent mild fatty infiltration of the liver. Multiple ill-defined liver lesions noted measuring up to 2.1 x 1.4 cm in the inferior aspect of the right lobe of the liver anteriorly. A 1.2 x 1.3 cm partially calcified lesion in the right lobe of the liver more centrally (series 2, image 17) noted. These lesions are not characterized on this CT. Further evaluation with MRI without and with contrast is recommended. There is no intrahepatic biliary ductal dilatation. The gallbladder is unremarkable.  Pancreas: Unremarkable. No pancreatic ductal dilatation or surrounding inflammatory changes. Spleen: Multiple faintly visualized splenic hypodense lesions measuring up to 1.3 cm in the superior pole are indeterminate but may represent cysts or hemangioma. Adrenals/Urinary Tract: The adrenal glands appear unremarkable. A 1 cm focal cortical indentation and non enhancement involving the interpolar aspect of the right kidney is indeterminate but likely represents an area of scarring cor a small renal cyst. The kidneys are otherwise unremarkable. There is no hydronephrosis on either side. The visualized ureters and urinary bladder appear unremarkable. Stomach/Bowel: Oral contrast opacifies loops of small bowel and traverses into the colon without evidence of bowel obstruction. No active inflammatory changes of the bowel noted. The appendix is not visualized with certainty. No inflammatory changes identified in the right lower  quadrant. Vascular/Lymphatic: There is moderate aortoiliac atherosclerotic disease. The origins of the celiac axis, SMA, IMA as well as the origins of the renal arteries are patent. The SMV, splenic vein, and main portal vein are patent. No portal venous gas identified. There is no adenopathy. Reproductive: The prostate and seminal vesicles are grossly unremarkable. Other: Small fat containing umbilical hernia. Small right posterior diaphragmatic defect with herniation of small amount of abdominal fat. Probable right inguinal hernia repair Musculoskeletal: Mild degenerative changes of the spine. No acute fracture. IMPRESSION: 1. Multiple hepatic lesions which are ill-defined and incompletely characterized on the CT. Further characterization with MRI is recommended. 2. Faint splenic hypodense lesions, incompletely characterized, possibly versus hemangioma. 3. No evidence of bowel obstruction or active inflammation. 4. Mild cardiomegaly head with enlargement of right cardiac chambers. Electronically  Signed   By: Anner Crete M.D.   On: 09/12/2016 02:26   US Venous Img Lower Bilateral  Result Date: 09/12/2016 CLINICAL DATA:  Lower extremity edema bilaterally EXAM: BILATERAL LOWER EXTREMITY VENOUS DUPLEX ULTRASOUND TECHNIQUE: Gray-scale sonography with graded compression, as well as color Doppler and duplex ultrasound were performed to evaluate the lower extremity deep venous systems from the level of the common femoral vein and including the common femoral, femoral, profunda femoral, popliteal and calf veins including the posterior tibial, peroneal and gastrocnemius veins when visible. The superficial great saphenous vein was also interrogated. Spectral Doppler was utilized to evaluate flow at rest and with distal augmentation maneuvers in the common femoral, femoral and popliteal veins. COMPARISON:  None. FINDINGS: RIGHT LOWER EXTREMITY Common Femoral Vein: No evidence of thrombus. Normal compressibility, respiratory phasicity and response to augmentation. Saphenofemoral Junction: No evidence of thrombus. Normal compressibility and flow on color Doppler imaging. Profunda Femoral Vein: No evidence of thrombus. Normal compressibility and flow on color Doppler imaging. Femoral Vein: No evidence of thrombus. Normal compressibility, respiratory phasicity and response to augmentation. Popliteal Vein: No evidence of thrombus. Normal compressibility, respiratory phasicity and response to augmentation. Calf Veins: No evidence of thrombus. Normal compressibility and flow on color Doppler imaging. Superficial Great Saphenous Vein: No evidence of thrombus. Normal compressibility and flow on color Doppler imaging. Venous Reflux:  None. Other Findings:  None. LEFT LOWER EXTREMITY Common Femoral Vein: No evidence of thrombus. Normal compressibility, respiratory phasicity and response to augmentation. Saphenofemoral Junction: No evidence of thrombus. Normal compressibility and flow on color Doppler imaging. Profunda  Femoral Vein: No evidence of thrombus. Normal compressibility and flow on color Doppler imaging. Femoral Vein: No evidence of thrombus. Normal compressibility, respiratory phasicity and response to augmentation. Popliteal Vein: No evidence of thrombus. Normal compressibility, respiratory phasicity and response to augmentation. Calf Veins: No evidence of thrombus. Normal compressibility and flow on color Doppler imaging. Superficial Great Saphenous Vein: No evidence of thrombus. Normal compressibility and flow on color Doppler imaging. Venous Reflux:  None. Other Findings:  None. IMPRESSION: No evidence of deep venous thrombosis in either lower extremity. Electronically Signed   By: Lowella Grip III M.D.   On: 09/12/2016 15:09   US Abdomen Limited Ruq  Result Date: 09/12/2016 CLINICAL DATA:  Initial evaluation for elevated LFTs, nausea, weakness. EXAM: US ABDOMEN LIMITED - RIGHT UPPER QUADRANT COMPARISON:  None. FINDINGS: Gallbladder: No gallstones or wall thickening visualized. No sonographic Murphy sign noted by sonographer. Common bile duct: Diameter: 2.9 mm Liver: Several well-circumscribed echogenic lesions were seen as follows semi: 1.3 x 0.9 x 1.5 cm lesion within the caudate lobe. Additional 2.8 x 3.7 x 3.4 cm lesion  within the right hepatic lobe. Second 1.1 x 1.2 x 1.3 cm lesion present within the right hepatic lobe. Third right hepatic lobe lesion measured 3.8 x 2.4 x 3.6 cm. Underlying hepatic echotexture within normal limits. Trace right pleural effusion. IMPRESSION: 1. Normal sonographic appearance of the gallbladder. No biliary dilatation. 2. Multiple echogenic well-circumscribed lesions within the liver as above, indeterminate, but most characteristic for probable small hemangiomas. 3. Trace right pleural effusion. Electronically Signed   By: Jeannine Boga M.D.   On: 09/12/2016 00:01    EKG:   Orders placed or performed during the hospital encounter of 09/11/16  . ED EKG  . ED  EKG  . ED EKG  . ED EKG      Management plans discussed with the patient, family and they are in agreement.  CODE STATUS:     Code Status Orders        Start     Ordered   09/11/16 2131  Full code  Continuous     09/11/16 2131    Code Status History    Date Active Date Inactive Code Status Order ID Comments User Context   This patient has a current code status but no historical code status.      TOTAL TIME TAKING CARE OF THIS PATIENT 40 minutes.    Theodoro Grist M.D on 09/13/2016 at 4:35 PM  Between 7am to 6pm - Pager - [phone]  After 6pm go to [email or web address] - password [email or web address] [facility]  Office  [phone]  CC: Primary care physician; Denita Lung, DO

## 2016-09-13 NOTE — Care Management (Signed)
No discharge needs identified by members of care team or patient/wife.

## 2016-09-13 NOTE — Care Management (Signed)
Provided patient with list of PCP accepting new patients per request.  Insterested in looking fo a new one.

## 2016-09-14 LAB — AFP TUMOR MARKER: AFP-Tumor Marker: 1.2 ng/mL (ref 0.0–8.3)

## 2016-09-14 LAB — ALPHA-1 ANTITRYPSIN PHENOTYPE: A1 ANTITRYPSIN SER: 219 mg/dL — AB (ref 90–200)

## 2016-09-14 LAB — MITOCHONDRIAL ANTIBODIES: Mitochondrial M2 Ab, IgG: 79.6 Units — ABNORMAL HIGH (ref 0.0–20.0)

## 2016-09-16 ENCOUNTER — Telehealth: Payer: Self-pay

## 2016-09-16 LAB — CULTURE, BLOOD (ROUTINE X 2)
CULTURE: NO GROWTH
CULTURE: NO GROWTH

## 2016-09-16 NOTE — Telephone Encounter (Signed)
-----   Message from [name], MD sent at 09/15/2016  2:33 PM EST ----- This patient generally have a follow-up in the office after being discharged from the hospital.

## 2016-09-16 NOTE — Telephone Encounter (Signed)
Pt has a follow up appt with Dr. Allen Norris on 09/20/16.

## 2016-09-20 ENCOUNTER — Ambulatory Visit (INDEPENDENT_AMBULATORY_CARE_PROVIDER_SITE_OTHER): Payer: Medicare Other | Admitting: Gastroenterology

## 2016-09-20 ENCOUNTER — Other Ambulatory Visit: Payer: Self-pay

## 2016-09-20 ENCOUNTER — Encounter: Payer: Self-pay | Admitting: Gastroenterology

## 2016-09-20 VITALS — BP 128/70 | HR 67 | Temp 98.5°F | Ht 68.0 in | Wt 160.0 lb

## 2016-09-20 DIAGNOSIS — G459 Transient cerebral ischemic attack, unspecified: Secondary | ICD-10-CM | POA: Insufficient documentation

## 2016-09-20 DIAGNOSIS — K7460 Unspecified cirrhosis of liver: Secondary | ICD-10-CM

## 2016-09-20 DIAGNOSIS — R748 Abnormal levels of other serum enzymes: Secondary | ICD-10-CM

## 2016-09-20 DIAGNOSIS — I340 Nonrheumatic mitral (valve) insufficiency: Secondary | ICD-10-CM | POA: Insufficient documentation

## 2016-09-20 NOTE — Progress Notes (Signed)
Primary Care Physician: Denita Lung, DO  Primary Gastroenterologist:  Dr. [name]  Chief Complaint  Patient presents with  . Hospitalization Follow-up    HPI: Louis Sanchez is a 76 y.o. male here for follow-up after being in the hospital. The patient has a history of long-standing alcohol abuse but has stopped since being discharged from the hospital. The patient was noted to have a nodular liver and a CT scan was suggestive of abnormal lesions in his liver. The patient then had a follow-up MRI that showed him to have lesions consistent with possible hepatocellular carcinoma. His alpha-fetoprotein was negative and his antimitochondrial antibody was positive. The patient's INR was prolonged and his platelets were borderline low. The patient states that his itching and rash on his lower extremities have resolved. He also states that he is feeling well. He has lost approximately 10 pounds since being discharged from the hospital.  Current Outpatient Prescriptions  Medication Sig Dispense Refill  . atenolol (TENORMIN) 25 MG tablet Take 0.5 tablets by mouth every evening.    . fish oil-omega-3 fatty acids 1000 MG capsule Take 2 g by mouth daily.    Marland Kitchen glucosamine-chondroitin 500-400 MG tablet Take 1 tablet by mouth 3 (three) times daily.    . Multiple Vitamin (MULTIVITAMIN) tablet Take 1 tablet by mouth daily.    . ondansetron (ZOFRAN) 4 MG tablet Take 1 tablet (4 mg total) by mouth every 6 (six) hours as needed for nausea. 20 tablet 0  . pantoprazole (PROTONIX) 40 MG tablet Take 1 tablet (40 mg total) by mouth daily. 30 tablet 5  . rivaroxaban (XARELTO) 20 MG TABS tablet Take 20 mg by mouth daily with supper.    . rosuvastatin (CRESTOR) 10 MG tablet Take 10 mg by mouth daily.    . sertraline (ZOLOFT) 100 MG tablet Take 1 tablet by mouth daily.    Marland Kitchen SYNTHROID 50 MCG tablet Take 1 tablet by mouth daily.    . vitamin C (ASCORBIC ACID) 500 MG tablet Take 500 mg by mouth daily.    . vitamin E  100 UNIT capsule Take 400 Units by mouth daily.    Marland Kitchen aspirin EC 81 MG tablet Take 81 mg by mouth.    . B Complex Vitamins (VITAMIN B COMPLEX PO) Take by mouth.    . B Complex-C (B-COMPLEX WITH VITAMIN C) tablet Take 1 tablet by mouth daily.    . Cholecalciferol (VITAMIN D3) 1000 units CAPS Take by mouth.    . fluocinonide cream (LIDEX) 0.05 % Apply topically.    Marland Kitchen levofloxacin (LEVAQUIN) 750 MG tablet Take 1 tablet (750 mg total) by mouth daily. (Patient not taking: Reported on 09/20/2016) 4 tablet 0  . pravastatin (PRAVACHOL) 20 MG tablet Take 20 mg by mouth.    . spironolactone (ALDACTONE) 50 MG tablet Take 1 tablet (50 mg total) by mouth 2 (two) times daily. (Patient not taking: Reported on 09/20/2016) 60 tablet 6  . sucralfate (CARAFATE) 1 g tablet Take 1 tablet (1 g total) by mouth 4 (four) times daily -  with meals and at bedtime. (Patient not taking: Reported on 09/20/2016) 90 tablet 0  . tretinoin (RETIN-A) 0.025 % cream Frequency:PHARMDIR   Dosage:0.0     Instructions:  Note:Apply to face at least 20 min after washing. Start with M and TH.  Gradually increase frequency. Use Moisturel cream to dilute if needed. Dose: 0.025 %    . valACYclovir (VALTREX) 1000 MG tablet Take 1,000 mg by mouth.  No current facility-administered medications for this visit.     Allergies as of 09/20/2016 - Review Complete 09/20/2016  Allergen Reaction Noted  . Atorvastatin  09/10/2014    ROS:  General: Negative for anorexia, weight loss, fever, chills, fatigue, weakness. ENT: Negative for hoarseness, difficulty swallowing , nasal congestion. CV: Negative for chest pain, angina, palpitations, dyspnea on exertion, peripheral edema.  Respiratory: Negative for dyspnea at rest, dyspnea on exertion, cough, sputum, wheezing.  GI: See history of present illness. GU:  Negative for dysuria, hematuria, urinary incontinence, urinary frequency, nocturnal urination.  Endo: Negative for unusual weight change.      Physical Examination:   BP 128/70   Pulse 67   Temp 98.5 F (36.9 C) (Oral)   Ht '5\' 8"'$  (1.727 m)   Wt 160 lb (72.6 kg)   BMI 24.33 kg/m   General: Well-nourished, well-developed in no acute distress.  Eyes: No icterus. Conjunctivae pink. Mouth: Oropharyngeal mucosa moist and pink , no lesions erythema or exudate. Lungs: Clear to auscultation bilaterally. Non-labored. Heart: Regular rate and rhythm, no murmurs rubs or gallops.  Abdomen: Bowel sounds are normal, nontender, nondistended, no hepatosplenomegaly or masses, no abdominal bruits or hernia , no rebound or guarding.   Extremities: No lower extremity edema. No clubbing or deformities. Neuro: Alert and oriented x 3.  Grossly intact. Skin: Warm and dry, no jaundice.   Psych: Alert and cooperative, normal mood and affect.  Labs:    Imaging Studies: Dg Chest 2 View  Result Date: 09/11/2016 CLINICAL DATA:  Acute onset of shortness of breath. Excessive perspiration. Initial encounter. EXAM: CHEST  2 VIEW COMPARISON:  None. FINDINGS: The lungs are well-aerated. Mild vascular congestion is noted. Mild bibasilar atelectasis is seen. Peribronchial thickening is noted. There is no evidence of pleural effusion or pneumothorax. The heart is borderline normal in size. No acute osseous abnormalities are seen. Cervical spinal fusion hardware is noted. IMPRESSION: Mild vascular congestion noted. Mild bibasilar atelectasis seen. Peribronchial thickening noted. Electronically Signed   By: Garald Balding M.D.   On: 09/11/2016 17:16   Ct Head Wo Contrast  Result Date: 09/11/2016 CLINICAL DATA:  Mental status changes for 1 week. EXAM: CT HEAD WITHOUT CONTRAST TECHNIQUE: Contiguous axial images were obtained from the base of the skull through the vertex without intravenous contrast. COMPARISON:  01/30/2014 FINDINGS: Brain: There is no evidence for acute hemorrhage, hydrocephalus, mass lesion, or abnormal extra-axial fluid collection. No definite CT  evidence for acute infarction. Patchy low attenuation in the deep hemispheric and periventricular white matter is nonspecific, but likely reflects chronic microvascular ischemic demyelination. Vascular: No hyperdense vessel or unexpected calcification. Skull: No evidence for fracture. No worrisome lytic or sclerotic lesion. Sinuses/Orbits: No acute finding.The visualized paranasal sinuses and mastoid air cells are clear. Visualized portions of the globes and intraorbital fat are unremarkable. Other: None. IMPRESSION: 1. No acute intracranial abnormality. 2. Chronic small vessel white matter ischemic disease. Electronically Signed   By: Misty Stanley M.D.   On: 09/11/2016 18:00   Mr Brain Wo Contrast  Result Date: 09/11/2016 CLINICAL DATA:  Initial evaluation for acute altered mental status. Palpable purpura. Evaluate for stroke or vasculitis. EXAM: MRI HEAD WITHOUT CONTRAST TECHNIQUE: Multiplanar, multiecho pulse sequences of the brain and surrounding structures were obtained without intravenous contrast. COMPARISON:  Prior CT from earlier same day. Comparison also made with prior MRI from 01/31/2014. FINDINGS: Brain: Study mildly degraded by motion artifact. Diffuse prominence of the CSF containing spaces is compatible with generalized age-related  cerebral atrophy. Patchy and confluent T2/FLAIR hyperintensity within the periventricular, deep, and subcortical white matter both cerebral hemispheres. Patchy T2/FLAIR abnormality present within the pons as well. Changes most like related to chronic microvascular ischemic disease, moderate nature. Overall, changes are similar to previous. No abnormal foci of restricted diffusion to suggest acute or subacute ischemia. Gray-white matter differentiation maintained. No evidence for acute or chronic intracranial hemorrhage. No cortical encephalomalacia to suggest prior cortical infarction. No mass lesion, midline shift, or mass effect. Ventricles within normal limits for  size without hydrocephalus. No extra-axial fluid collection. Major dural sinuses are grossly patent. Pituitary gland and suprasellar region within normal limits. Vascular: Major intracranial vascular flow voids are maintained. Skull and upper cervical spine: Craniocervical junction within normal limits. Mild degenerative spondylolysis noted within the upper cervical spine without significant stenosis. Bone marrow signal intensity within normal limits. No scalp soft tissue abnormality. Sinuses/Orbits: Globes and orbital soft tissues within normal limits. Scattered mucosal thickening within the ethmoidal air cells and right maxillary sinus. Paranasal sinuses are otherwise clear. Trace opacity bilateral mastoid air cells, of doubtful significance. Inner ear structures grossly normal. IMPRESSION: 1. No acute intracranial infarct or other process identified. 2. Moderate chronic small vessel ischemic changes involving the cerebral hemispheres and brainstem, similar to previous. Electronically Signed   By: Jeannine Boga M.D.   On: 09/11/2016 23:10   Mr Abdomen W Wo Contrast  Result Date: 09/12/2016 CLINICAL DATA:  76 year old male with multiple indeterminate lesions noted on recent CT examination. Further evaluation to exclude malignancy. EXAM: MRI ABDOMEN WITHOUT AND WITH CONTRAST TECHNIQUE: Multiplanar multisequence MR imaging of the abdomen was performed both before and after the administration of intravenous contrast. CONTRAST:  39m MULTIHANCE GADOBENATE DIMEGLUMINE 529 MG/ML IV SOLN COMPARISON:  No prior abdominal MRI. CT the abdomen and pelvis 09/12/2016. FINDINGS: Lower chest: Small bilateral pleural effusions lying dependently. Cardiomegaly. Increased T2 signal intensity in a lace-like network throughout the visualize lung bases suggestive of underlying edema. Hepatobiliary: There are several T2 hyperintense lesions scattered throughout the liver. The smallest of these lesions are only well visualized  on T2 weighted sequences and measure up to 8 mm in diameter (largest is on image 11 of series 6) in segment 8. However, the largest T2 hyperintense lesion is in the central aspect of segment 4A. This lesion, and another lesion which is essentially occult on T2 weighted sequences in segment 6/7 both demonstrate similar enhancement characteristics; specifically, these lesions appear very subtly hypovascular on early phase post gadolinium imaging with progressive washout on delayed phase imaging. These lesions measure 3.5 x 3.0 cm in segment 4A centrally (image 20 of series 20) and 4.2 x 2.4 cm in segments 6/7 (image 32 of series 20). Notably, both of these lesions appear to demonstrate some mild diffusion restriction on diffusion-weighted imaging. The liver does have a very subtle nodular contour, which could indicate early changes of cirrhosis. No intra or extrahepatic biliary ductal dilatation. Gallbladder is unremarkable in appearance. Pancreas: No pancreatic mass or peripancreatic inflammatory changes. No pancreatic ductal dilatation. Spleen: Spleen appears borderline enlarged measuring 4.9 x 12.5 x 13.1 cm (estimated splenic volume of 402 mL). Adrenals/Urinary Tract: Bilateral kidneys and bilateral adrenal glands are normal in appearance. There is no hydroureteronephrosis in the visualized abdomen. Stomach/Bowel: Visualized portions are unremarkable. Vascular/Lymphatic: Atherosclerosis in the abdominal vasculature, without evidence of aneurysm. No lymphadenopathy noted in the abdomen. Other: No significant volume of ascites noted in the visualized portions of the peritoneal cavity. Musculoskeletal: No aggressive osseous lesions  are noted in the visualized portions of the skeleton. IMPRESSION: 1. The liver lesions noted on recent CT examination are indeterminate. The largest of these lesions in segment 4A centrally, and between segments 6/7 demonstrate very unusual imaging characteristics. Given slight nodularity  of the liver, mild diffusion restriction of the lesions, and the enhancement characteristics, the possibility of multifocal hepatocellular carcinoma should be considered. Further evaluation with percutaneous biopsy is suggested if clinically appropriate at this time. 2. The appearance of the lung bases suggests edema. Increasing small bilateral pleural effusions. 3. Borderline splenomegaly. 4. Aortic atherosclerosis. Electronically Signed   By: Vinnie Langton M.D.   On: 09/12/2016 15:05   Ct Abdomen Pelvis W Contrast  Result Date: 09/12/2016 CLINICAL DATA:  76 year old male with history of AFib and hypertension presenting with nausea and weakness. EXAM: CT ABDOMEN AND PELVIS WITH CONTRAST TECHNIQUE: Multidetector CT imaging of the abdomen and pelvis was performed using the standard protocol following bolus administration of intravenous contrast. CONTRAST:  136m ISOVUE-300 IOPAMIDOL (ISOVUE-300) INJECTION 61% COMPARISON:  Abdominal ultrasound dated 09/11/2016 FINDINGS: Evaluation of this exam is limited due to respiratory motion artifact. Lower chest: Bibasilar streaky densities most consistent with atelectasis/ scarring. There is mild cardiomegaly with mild dilatation of the right cardiac chambers. There is mild pectus excavatum deformity of. No intra-abdominal free air or free fluid. Hepatobiliary: There is apparent mild fatty infiltration of the liver. Multiple ill-defined liver lesions noted measuring up to 2.1 x 1.4 cm in the inferior aspect of the right lobe of the liver anteriorly. A 1.2 x 1.3 cm partially calcified lesion in the right lobe of the liver more centrally (series 2, image 17) noted. These lesions are not characterized on this CT. Further evaluation with MRI without and with contrast is recommended. There is no intrahepatic biliary ductal dilatation. The gallbladder is unremarkable. Pancreas: Unremarkable. No pancreatic ductal dilatation or surrounding inflammatory changes. Spleen: Multiple  faintly visualized splenic hypodense lesions measuring up to 1.3 cm in the superior pole are indeterminate but may represent cysts or hemangioma. Adrenals/Urinary Tract: The adrenal glands appear unremarkable. A 1 cm focal cortical indentation and non enhancement involving the interpolar aspect of the right kidney is indeterminate but likely represents an area of scarring cor a small renal cyst. The kidneys are otherwise unremarkable. There is no hydronephrosis on either side. The visualized ureters and urinary bladder appear unremarkable. Stomach/Bowel: Oral contrast opacifies loops of small bowel and traverses into the colon without evidence of bowel obstruction. No active inflammatory changes of the bowel noted. The appendix is not visualized with certainty. No inflammatory changes identified in the right lower quadrant. Vascular/Lymphatic: There is moderate aortoiliac atherosclerotic disease. The origins of the celiac axis, SMA, IMA as well as the origins of the renal arteries are patent. The SMV, splenic vein, and main portal vein are patent. No portal venous gas identified. There is no adenopathy. Reproductive: The prostate and seminal vesicles are grossly unremarkable. Other: Small fat containing umbilical hernia. Small right posterior diaphragmatic defect with herniation of small amount of abdominal fat. Probable right inguinal hernia repair Musculoskeletal: Mild degenerative changes of the spine. No acute fracture. IMPRESSION: 1. Multiple hepatic lesions which are ill-defined and incompletely characterized on the CT. Further characterization with MRI is recommended. 2. Faint splenic hypodense lesions, incompletely characterized, possibly versus hemangioma. 3. No evidence of bowel obstruction or active inflammation. 4. Mild cardiomegaly head with enlargement of right cardiac chambers. Electronically Signed   By: AAnner CreteM.D.   On: 09/12/2016 02:26  US Venous Img Lower Bilateral  Result Date:  09/12/2016 CLINICAL DATA:  Lower extremity edema bilaterally EXAM: BILATERAL LOWER EXTREMITY VENOUS DUPLEX ULTRASOUND TECHNIQUE: Gray-scale sonography with graded compression, as well as color Doppler and duplex ultrasound were performed to evaluate the lower extremity deep venous systems from the level of the common femoral vein and including the common femoral, femoral, profunda femoral, popliteal and calf veins including the posterior tibial, peroneal and gastrocnemius veins when visible. The superficial great saphenous vein was also interrogated. Spectral Doppler was utilized to evaluate flow at rest and with distal augmentation maneuvers in the common femoral, femoral and popliteal veins. COMPARISON:  None. FINDINGS: RIGHT LOWER EXTREMITY Common Femoral Vein: No evidence of thrombus. Normal compressibility, respiratory phasicity and response to augmentation. Saphenofemoral Junction: No evidence of thrombus. Normal compressibility and flow on color Doppler imaging. Profunda Femoral Vein: No evidence of thrombus. Normal compressibility and flow on color Doppler imaging. Femoral Vein: No evidence of thrombus. Normal compressibility, respiratory phasicity and response to augmentation. Popliteal Vein: No evidence of thrombus. Normal compressibility, respiratory phasicity and response to augmentation. Calf Veins: No evidence of thrombus. Normal compressibility and flow on color Doppler imaging. Superficial Great Saphenous Vein: No evidence of thrombus. Normal compressibility and flow on color Doppler imaging. Venous Reflux:  None. Other Findings:  None. LEFT LOWER EXTREMITY Common Femoral Vein: No evidence of thrombus. Normal compressibility, respiratory phasicity and response to augmentation. Saphenofemoral Junction: No evidence of thrombus. Normal compressibility and flow on color Doppler imaging. Profunda Femoral Vein: No evidence of thrombus. Normal compressibility and flow on color Doppler imaging. Femoral Vein:  No evidence of thrombus. Normal compressibility, respiratory phasicity and response to augmentation. Popliteal Vein: No evidence of thrombus. Normal compressibility, respiratory phasicity and response to augmentation. Calf Veins: No evidence of thrombus. Normal compressibility and flow on color Doppler imaging. Superficial Great Saphenous Vein: No evidence of thrombus. Normal compressibility and flow on color Doppler imaging. Venous Reflux:  None. Other Findings:  None. IMPRESSION: No evidence of deep venous thrombosis in either lower extremity. Electronically Signed   By: Lowella Grip III M.D.   On: 09/12/2016 15:09   US Abdomen Limited Ruq  Result Date: 09/12/2016 CLINICAL DATA:  Initial evaluation for elevated LFTs, nausea, weakness. EXAM: US ABDOMEN LIMITED - RIGHT UPPER QUADRANT COMPARISON:  None. FINDINGS: Gallbladder: No gallstones or wall thickening visualized. No sonographic Murphy sign noted by sonographer. Common bile duct: Diameter: 2.9 mm Liver: Several well-circumscribed echogenic lesions were seen as follows semi: 1.3 x 0.9 x 1.5 cm lesion within the caudate lobe. Additional 2.8 x 3.7 x 3.4 cm lesion within the right hepatic lobe. Second 1.1 x 1.2 x 1.3 cm lesion present within the right hepatic lobe. Third right hepatic lobe lesion measured 3.8 x 2.4 x 3.6 cm. Underlying hepatic echotexture within normal limits. Trace right pleural effusion. IMPRESSION: 1. Normal sonographic appearance of the gallbladder. No biliary dilatation. 2. Multiple echogenic well-circumscribed lesions within the liver as above, indeterminate, but most characteristic for probable small hemangiomas. 3. Trace right pleural effusion. Electronically Signed   By: Jeannine Boga M.D.   On: 09/12/2016 00:01    Assessment and Plan:   ENOS MUHL is a 76 y.o. y/o male who is here for follow-up after being in the hospital. The patient has been found to have hepatic lesions suggestive of possible hepatocellular  carcinoma in the setting of a nodular liver. His alpha-fetoprotein was negative but his antimitochondrial antibody was positive. The patient will be set up  for a CT-guided liver biopsy to obtain tissue from the lesion. The patient is on blood thinners for A. fib and will need to stop that blood thinner prior to having a liver biopsy. The patient will also have his INR and CBC checked prior to the liver biopsy. The patient and his wife requested that his blood thinner be stopped for 5 days instead of a short a period since he had post-polypectomy bleeding after his last colonoscopy when it was only stopped for 2 days. I have told the patient and his wife that I will leave this up to the cardiologist to determine how long it is safe to stop his anticoagulation.    [name], MD. Marval Regal   Note: This dictation was prepared with Dragon dictation along with smaller phrase technology. Any transcriptional errors that result from this process are unintentional.

## 2016-09-20 NOTE — Patient Instructions (Addendum)
   If you need to cancel this scan for any reason, please contact Central scheduling at [phone].

## 2016-09-21 ENCOUNTER — Other Ambulatory Visit: Payer: Self-pay

## 2016-09-23 ENCOUNTER — Telehealth: Payer: Self-pay

## 2016-09-23 NOTE — Telephone Encounter (Signed)
Pt requesting Dr. Mariah Milling @ [facility] perform liver biopsy.   Appt scheduled for 10/04/16 @ 1230

## 2016-10-01 ENCOUNTER — Ambulatory Visit: Payer: Federal, State, Local not specified - PPO

## 2016-10-03 DIAGNOSIS — C7B8 Other secondary neuroendocrine tumors: Secondary | ICD-10-CM | POA: Insufficient documentation

## 2016-10-07 DIAGNOSIS — I700 Atherosclerosis of aorta: Secondary | ICD-10-CM | POA: Insufficient documentation

## 2016-10-07 DIAGNOSIS — K7030 Alcoholic cirrhosis of liver without ascites: Secondary | ICD-10-CM | POA: Insufficient documentation

## 2016-10-07 DIAGNOSIS — E039 Hypothyroidism, unspecified: Secondary | ICD-10-CM | POA: Insufficient documentation

## 2016-10-07 DIAGNOSIS — G4733 Obstructive sleep apnea (adult) (pediatric): Secondary | ICD-10-CM | POA: Insufficient documentation

## 2016-10-07 DIAGNOSIS — D696 Thrombocytopenia, unspecified: Secondary | ICD-10-CM | POA: Insufficient documentation

## 2016-10-26 ENCOUNTER — Ambulatory Visit
Admission: RE | Admit: 2016-10-26 | Discharge: 2016-10-26 | Disposition: A | Discharge status: Home / Self Care | Payer: Medicare Other | Source: Ambulatory Visit | Attending: Gastroenterology | Admitting: Gastroenterology

## 2016-10-26 ENCOUNTER — Other Ambulatory Visit: Payer: Self-pay | Admitting: Gastroenterology

## 2016-10-26 DIAGNOSIS — R1314 Dysphagia, pharyngoesophageal phase: Secondary | ICD-10-CM | POA: Diagnosis not present

## 2016-10-26 DIAGNOSIS — K449 Diaphragmatic hernia without obstruction or gangrene: Secondary | ICD-10-CM | POA: Insufficient documentation

## 2016-11-28 DIAGNOSIS — C3431 Malignant neoplasm of lower lobe, right bronchus or lung: Secondary | ICD-10-CM | POA: Insufficient documentation

## 2016-12-11 DIAGNOSIS — Z7901 Long term (current) use of anticoagulants: Secondary | ICD-10-CM | POA: Insufficient documentation

## 2016-12-11 DIAGNOSIS — Z87891 Personal history of nicotine dependence: Secondary | ICD-10-CM | POA: Insufficient documentation

## 2017-11-09 ENCOUNTER — Other Ambulatory Visit: Payer: Self-pay | Admitting: Internal Medicine

## 2017-11-09 ENCOUNTER — Other Ambulatory Visit
Admission: RE | Admit: 2017-11-09 | Discharge: 2017-11-09 | Disposition: A | Discharge status: Home / Self Care | Payer: Medicare Other | Source: Ambulatory Visit | Attending: Internal Medicine | Admitting: Internal Medicine

## 2017-11-09 DIAGNOSIS — C7B8 Other secondary neuroendocrine tumors: Secondary | ICD-10-CM

## 2017-11-09 DIAGNOSIS — I482 Chronic atrial fibrillation: Secondary | ICD-10-CM | POA: Diagnosis present

## 2017-11-09 DIAGNOSIS — K7030 Alcoholic cirrhosis of liver without ascites: Secondary | ICD-10-CM | POA: Diagnosis present

## 2017-11-09 DIAGNOSIS — Z85118 Personal history of other malignant neoplasm of bronchus and lung: Secondary | ICD-10-CM

## 2017-11-09 DIAGNOSIS — R413 Other amnesia: Secondary | ICD-10-CM

## 2017-11-09 LAB — AMMONIA: Ammonia: 12 umol/L (ref 9–35)

## 2017-11-17 ENCOUNTER — Ambulatory Visit
Admission: RE | Admit: 2017-11-17 | Discharge: 2017-11-17 | Disposition: A | Discharge status: Home / Self Care | Payer: Medicare Other | Source: Ambulatory Visit | Attending: Internal Medicine | Admitting: Internal Medicine

## 2017-11-17 DIAGNOSIS — Z85118 Personal history of other malignant neoplasm of bronchus and lung: Secondary | ICD-10-CM | POA: Diagnosis present

## 2017-11-17 DIAGNOSIS — C7B8 Other secondary neuroendocrine tumors: Secondary | ICD-10-CM | POA: Insufficient documentation

## 2017-11-17 DIAGNOSIS — R413 Other amnesia: Secondary | ICD-10-CM | POA: Insufficient documentation

## 2017-11-17 DIAGNOSIS — I6782 Cerebral ischemia: Secondary | ICD-10-CM | POA: Insufficient documentation

## 2017-11-17 MED ORDER — GADOBENATE DIMEGLUMINE 529 MG/ML IV SOLN
15.0000 mL | Freq: Once | INTRAVENOUS | Status: AC | PRN
  Administered 2017-11-17: 15 mL via INTRAVENOUS

## 2017-12-19 ENCOUNTER — Other Ambulatory Visit: Payer: Self-pay | Admitting: Internal Medicine

## 2017-12-19 DIAGNOSIS — D3A8 Other benign neuroendocrine tumors: Secondary | ICD-10-CM

## 2017-12-22 ENCOUNTER — Ambulatory Visit
Admission: RE | Admit: 2017-12-22 | Discharge: 2017-12-22 | Disposition: A | Discharge status: Home / Self Care | Payer: Medicare Other | Source: Ambulatory Visit | Attending: Internal Medicine | Admitting: Internal Medicine

## 2017-12-22 DIAGNOSIS — D3A8 Other benign neuroendocrine tumors: Secondary | ICD-10-CM | POA: Diagnosis present

## 2017-12-22 MED ORDER — GALLIUM GA 68 DOTATATE IV KIT
4.4000 | PACK | Freq: Once | INTRAVENOUS | Status: AC
  Administered 2017-12-22: 4.4 via INTRAVENOUS

## 2018-08-06 ENCOUNTER — Telehealth: Payer: Self-pay | Admitting: *Deleted

## 2018-08-06 NOTE — Telephone Encounter (Signed)
Left message for patient to call back and br scheduled with Dr. Bary Castilla or be ref to another doctor.

## 2018-08-21 ENCOUNTER — Ambulatory Visit: Payer: Medicare Other | Admitting: General Surgery

## 2019-03-28 ENCOUNTER — Telehealth: Payer: Self-pay

## 2019-03-28 NOTE — Telephone Encounter (Signed)
Spoke with patient's wife about getting the patient set up with a GI provider for his Colonoscopy due this year. They will contact Dr Charlestine Night to have this done as he has had one with him in the past.

## 2019-05-21 DIAGNOSIS — Z85828 Personal history of other malignant neoplasm of skin: Secondary | ICD-10-CM | POA: Insufficient documentation

## 2019-10-06 ENCOUNTER — Encounter: Payer: Self-pay | Admitting: Emergency Medicine

## 2019-10-06 ENCOUNTER — Emergency Department
Admission: EM | Admit: 2019-10-06 | Discharge: 2019-10-06 | Disposition: A | Discharge status: Home / Self Care | Payer: Medicare Other | Attending: Emergency Medicine | Admitting: Emergency Medicine

## 2019-10-06 ENCOUNTER — Other Ambulatory Visit: Payer: Self-pay

## 2019-10-06 DIAGNOSIS — Z87891 Personal history of nicotine dependence: Secondary | ICD-10-CM | POA: Diagnosis not present

## 2019-10-06 DIAGNOSIS — Y999 Unspecified external cause status: Secondary | ICD-10-CM | POA: Insufficient documentation

## 2019-10-06 DIAGNOSIS — Y93E8 Activity, other personal hygiene: Secondary | ICD-10-CM | POA: Diagnosis not present

## 2019-10-06 DIAGNOSIS — Z8505 Personal history of malignant neoplasm of liver: Secondary | ICD-10-CM | POA: Insufficient documentation

## 2019-10-06 DIAGNOSIS — S91105A Unspecified open wound of left lesser toe(s) without damage to nail, initial encounter: Secondary | ICD-10-CM | POA: Diagnosis not present

## 2019-10-06 DIAGNOSIS — Z7982 Long term (current) use of aspirin: Secondary | ICD-10-CM | POA: Diagnosis not present

## 2019-10-06 DIAGNOSIS — W268XXA Contact with other sharp object(s), not elsewhere classified, initial encounter: Secondary | ICD-10-CM | POA: Diagnosis not present

## 2019-10-06 DIAGNOSIS — Z85118 Personal history of other malignant neoplasm of bronchus and lung: Secondary | ICD-10-CM | POA: Insufficient documentation

## 2019-10-06 DIAGNOSIS — E039 Hypothyroidism, unspecified: Secondary | ICD-10-CM | POA: Diagnosis not present

## 2019-10-06 DIAGNOSIS — Y929 Unspecified place or not applicable: Secondary | ICD-10-CM | POA: Insufficient documentation

## 2019-10-06 DIAGNOSIS — S91109A Unspecified open wound of unspecified toe(s) without damage to nail, initial encounter: Secondary | ICD-10-CM

## 2019-10-06 DIAGNOSIS — Z79899 Other long term (current) drug therapy: Secondary | ICD-10-CM | POA: Insufficient documentation

## 2019-10-06 DIAGNOSIS — Z7901 Long term (current) use of anticoagulants: Secondary | ICD-10-CM | POA: Insufficient documentation

## 2019-10-06 DIAGNOSIS — Z8673 Personal history of transient ischemic attack (TIA), and cerebral infarction without residual deficits: Secondary | ICD-10-CM | POA: Insufficient documentation

## 2019-10-06 HISTORY — DX: Malignant (primary) neoplasm, unspecified: C80.1

## 2019-10-06 NOTE — ED Triage Notes (Signed)
Patient was clipping his toenails last night and clipped the actual toe on his fourth toe on his left foot.  Patient reports difficulty getting bleeding to stop since patient is on xarelto.  Patient is in no obvious distress at this time.

## 2019-10-06 NOTE — ED Provider Notes (Signed)
[facility] Emergency Department Provider Note  ____________________________________________   First MD Initiated Contact with Patient 10/06/19 1655     (approximate)  I have reviewed the triage vital signs and the nursing notes.   HISTORY  Chief Complaint Foot Injury    HPI Louis Sanchez is a 79 y.o. male presents to the ED with complaints of his left fourth toe continuing to bleed after he cut a piece of skin while cutting his nails.  Patient is on Xarelto has not been able to stop the bleeding since yesterday.    Past Medical History:  Diagnosis Date  . Arthritis   . Benign neoplasm of colon 2004  . Benign neoplasm of colon 04/04/2008   64mm tubular adenoma of the ascending colon  . Cancer ([place])    liver and lungs  . Heart disease   . Other and unspecified hyperlipidemia 1997  . Other specified disorder of male genital organs(608.89) 214  . Sleep disturbance, unspecified   . Thyroid disease   . Unspecified hypothyroidism 2005    Patient Active Problem List   Diagnosis Date Noted  . Mitral regurgitation 09/20/2016  . TIA (transient ischemic attack) 09/20/2016  . Liver masses 09/13/2016  . Acute bronchitis 09/13/2016  . Leukocytosis 09/13/2016  . Venous stasis dermatitis 09/13/2016  . LFT elevation   . Alcoholic gastritis   . Atrial fibrillation with RVR ([place]) 09/11/2016  . Acute lower GI bleeding 05/23/2016  . 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 Hematochezia 05/23/2016  . History of hypothyroidism 05/23/2016  . Chronic atrial fibrillation ([place]) 05/03/2016  . Ruptured varicose vein 01/25/2016  . Varicose veins of both lower extremities with complications 73/22/0254  . 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 Temporary cerebral vascular dysfunction 08/25/2014  . Inguinal hernia 02/18/2013  . Actinic keratosis 01/21/2010  . 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 Benign neoplasm of colon 04/04/2008    Past Surgical History:  Procedure Laterality  Date  . APPENDECTOMY  1953  . colon polyps  2009  . HERNIA REPAIR  1957   Right  . INGUINAL HERNIA REPAIR Right 2014  . SPINE SURGERY  1997   neck  . WRIST SURGERY      Prior to Admission medications   Medication Sig Start Date End Date Taking? Authorizing Provider  aspirin EC 81 MG tablet Take 81 mg by mouth.    [provider]  atenolol (TENORMIN) 25 MG tablet Take 0.5 tablets by mouth every evening. 01/21/13   [provider]  B Complex Vitamins (VITAMIN B COMPLEX PO) Take by mouth.    [provider]  B Complex-C (B-COMPLEX WITH VITAMIN C) tablet Take 1 tablet by mouth daily.    [provider]  Cholecalciferol (VITAMIN D3) 1000 units CAPS Take by mouth.    [provider]  fish oil-omega-3 fatty acids 1000 MG capsule Take 2 g by mouth daily.    [provider]  fluocinonide cream (LIDEX) 0.05 % Apply topically. 08/12/15   [provider]  glucosamine-chondroitin 500-400 MG tablet Take 1 tablet by mouth 3 (three) times daily.    [provider]  levofloxacin (LEVAQUIN) 750 MG tablet Take 1 tablet (750 mg total) by mouth daily. Patient not taking: Reported on 09/20/2016 09/13/16   Theodoro Grist, MD  Multiple Vitamin (MULTIVITAMIN) tablet Take 1 tablet by mouth daily.    [provider]  ondansetron (ZOFRAN) 4 MG tablet Take 1 tablet (4 mg total) by mouth every 6 (six)  hours as needed for nausea. 09/13/16   Theodoro Grist, MD  pantoprazole (PROTONIX) 40 MG tablet Take 1 tablet (40 mg total) by mouth daily. 09/14/16   Theodoro Grist, MD  pravastatin (PRAVACHOL) 20 MG tablet Take 20 mg by mouth. 09/10/14   [provider]  rivaroxaban (XARELTO) 20 MG TABS tablet Take 20 mg by mouth daily with supper.    [provider]  rosuvastatin (CRESTOR) 10 MG tablet Take 10 mg by mouth daily. 07/28/16   [provider]  sertraline (ZOLOFT) 100 MG tablet Take 1 tablet by mouth daily. 01/21/13    [provider]  spironolactone (ALDACTONE) 50 MG tablet Take 1 tablet (50 mg total) by mouth 2 (two) times daily. Patient not taking: Reported on 09/20/2016 09/13/16   Theodoro Grist, MD  sucralfate (CARAFATE) 1 g tablet Take 1 tablet (1 g total) by mouth 4 (four) times daily -  with meals and at bedtime. Patient not taking: Reported on 09/20/2016 09/13/16   Theodoro Grist, MD  SYNTHROID 50 MCG tablet Take 1 tablet by mouth daily. 01/22/13   [provider]  tretinoin (RETIN-A) 0.025 % cream Frequency:PHARMDIR   Dosage:0.0     Instructions:  Note:Apply to face at least 20 min after washing. Start with M and TH.  Gradually increase frequency. Use Moisturel cream to dilute if needed. Dose: 0.025 % 03/14/12   [provider]  valACYclovir (VALTREX) 1000 MG tablet Take 1,000 mg by mouth. 05/26/11   [provider]  vitamin C (ASCORBIC ACID) 500 MG tablet Take 500 mg by mouth daily.    [provider]  vitamin E 100 UNIT capsule Take 400 Units by mouth daily.    [provider]    Allergies Atorvastatin  Family History  Problem Relation Age of Onset  . Alzheimer's disease Mother     Social History Social History   Tobacco Use  . Smoking status: Former Smoker    Years: 25.00    Types: Cigarettes  . Smokeless tobacco: Never Used  Substance Use Topics  . Alcohol use: Yes    Alcohol/week: 15.0 standard drinks    Types: 15 Standard drinks or equivalent per week  . Drug use: No    Review of Systems  Constitutional: No fever/chills Eyes: No visual changes. ENT: No sore throat. Respiratory: Denies cough Cardiovascular: Denies chest pain Gastrointestinal: Denies abdominal pain Genitourinary: Negative for dysuria. Musculoskeletal: Negative for back pain. Skin: Negative for rash.  Positive for skin tear of the left fourth toe Psychiatric: no mood changes,     ____________________________________________   PHYSICAL EXAM:  VITAL  SIGNS: ED Triage Vitals [10/06/19 1620]  Enc Vitals Group     BP 127/66     Pulse Rate 81     Resp 16     Temp 97.7 F (36.5 C)     Temp Source Oral     SpO2 98 %     Weight 145 lb (65.8 kg)     Height 5\' 8"  (1.727 m)     Head Circumference      Peak Flow      Pain Score 0     Pain Loc      Pain Edu?      Excl. in [place]?     Constitutional: Alert and oriented. Well appearing and in no acute distress. Eyes: Conjunctivae are normal.  Head: Atraumatic. Nose: No congestion/rhinnorhea.   Neck:  supple no lymphadenopathy noted Cardiovascular: Normal rate, regular rhythm. Respiratory:  Normal respiratory effort.  No retractions GU: deferred Musculoskeletal: FROM all extremities, warm and well perfused, skin avulsion noted to the tip of the left fourth toe, no active bleeding is noted at this time although the area is moist and blood appears on the gauze. Neurologic:  Normal speech and language.  Skin:  Skin is warm, dry  No rash noted. Psychiatric: Mood and affect are normal. Speech and behavior are normal.  ____________________________________________   LABS (all labs ordered are listed, but only abnormal results are displayed)  Labs Reviewed - No data to display ____________________________________________   ____________________________________________  RADIOLOGY    ____________________________________________   PROCEDURES  Procedure(s) performed: Area was cleaned with normal saline, Surgicel was applied to the wound, area was dressed.  Bleeding was controlled.  Procedures    ____________________________________________   INITIAL IMPRESSION / ASSESSMENT AND PLAN / ED COURSE  Pertinent labs & imaging results that were available during my care of the patient were reviewed by me and considered in my medical decision making (see chart for details).   Patient is a 79 year old male presents emergency department skin tear and active bleeding of the left fourth toe.   See HPI Physical exam does show the left fourth toe to have skin avulsion with no active bleeding  Surgicel was applied to the wound.  Area is not bleeding.  Area was dressed and patient was discharged in stable condition.    Louis Sanchez was evaluated in Emergency Department on 10/06/2019 for the symptoms described in the history of present illness. He was evaluated in the context of the global COVID-19 pandemic, which necessitated consideration that the patient might be at risk for infection with the SARS-CoV-2 virus that causes COVID-19. Institutional protocols and algorithms that pertain to the evaluation of patients at risk for COVID-19 are in a state of rapid change based on information released by regulatory bodies including the CDC and federal and state organizations. These policies and algorithms were followed during the patient's care in the ED.   As part of my medical decision making, I reviewed the following data within the [place] notes reviewed and incorporated, Old chart reviewed, Notes from prior ED visits and [place] Controlled Substance Database  ____________________________________________   FINAL CLINICAL IMPRESSION(S) / ED DIAGNOSES  Final diagnoses:  Avulsion of skin of toe, initial encounter      NEW MEDICATIONS STARTED DURING THIS VISIT:  New Prescriptions   No medications on file     Note:  This document was prepared using Dragon voice recognition software and may include unintentional dictation errors.    Versie Starks, PA-C 10/06/19 1724    Lavonia Drafts, MD 10/06/19 1750

## 2019-10-06 NOTE — Discharge Instructions (Addendum)
Keep area elevated if it begins to bleed.  You may apply more of the Surgicel if needed to the active bleeding area.  Return to emergency department if worsening

## 2019-11-18 DIAGNOSIS — M9951 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: Secondary | ICD-10-CM | POA: Insufficient documentation

## 2019-11-18 DIAGNOSIS — M542 Cervicalgia: Secondary | ICD-10-CM | POA: Insufficient documentation

## 2019-11-18 DIAGNOSIS — M47812 Spondylosis without myelopathy or radiculopathy, cervical region: Secondary | ICD-10-CM | POA: Insufficient documentation

## 2019-11-18 DIAGNOSIS — M4322 Fusion of spine, cervical region: Secondary | ICD-10-CM | POA: Insufficient documentation

## 2019-11-26 ENCOUNTER — Encounter: Payer: Self-pay | Admitting: Physical Therapy

## 2019-11-26 ENCOUNTER — Ambulatory Visit: Payer: Medicare Other | Attending: Neurosurgery | Admitting: Physical Therapy

## 2019-11-26 ENCOUNTER — Other Ambulatory Visit: Payer: Self-pay

## 2019-11-26 DIAGNOSIS — M542 Cervicalgia: Secondary | ICD-10-CM | POA: Insufficient documentation

## 2019-11-26 DIAGNOSIS — R293 Abnormal posture: Secondary | ICD-10-CM

## 2019-11-26 DIAGNOSIS — R2681 Unsteadiness on feet: Secondary | ICD-10-CM | POA: Insufficient documentation
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
## 2019-11-28 ENCOUNTER — Ambulatory Visit: Payer: Medicare Other | Admitting: Physical Therapy

## 2019-12-02 ENCOUNTER — Encounter: Payer: Medicare Other | Admitting: Physical Therapy

## 2019-12-05 ENCOUNTER — Encounter: Payer: Medicare Other | Admitting: Physical Therapy

## 2019-12-09 ENCOUNTER — Encounter: Payer: Medicare Other | Admitting: Physical Therapy

## 2019-12-12 ENCOUNTER — Encounter: Payer: Medicare Other | Admitting: Physical Therapy

## 2019-12-16 ENCOUNTER — Encounter: Payer: Medicare Other | Admitting: Physical Therapy

## 2019-12-19 ENCOUNTER — Encounter: Payer: Medicare Other | Admitting: Physical Therapy

## 2019-12-31 ENCOUNTER — Other Ambulatory Visit: Payer: Self-pay | Admitting: Family Medicine

## 2019-12-31 ENCOUNTER — Other Ambulatory Visit: Payer: Self-pay

## 2019-12-31 ENCOUNTER — Other Ambulatory Visit (HOSPITAL_COMMUNITY): Payer: Self-pay | Admitting: Family Medicine

## 2019-12-31 ENCOUNTER — Ambulatory Visit
Admission: RE | Admit: 2019-12-31 | Discharge: 2019-12-31 | Disposition: A | Discharge status: Home / Self Care | Payer: Medicare Other | Source: Ambulatory Visit | Attending: Family Medicine | Admitting: Family Medicine

## 2019-12-31 DIAGNOSIS — M79605 Pain in left leg: Secondary | ICD-10-CM | POA: Insufficient documentation

## 2019-12-31 DIAGNOSIS — M79604 Pain in right leg: Secondary | ICD-10-CM

## 2019-12-31 DIAGNOSIS — R600 Localized edema: Secondary | ICD-10-CM

## 2020-02-05 ENCOUNTER — Ambulatory Visit: Payer: Medicare Other | Admitting: Physical Therapy

## 2020-02-11 ENCOUNTER — Ambulatory Visit: Payer: Medicare Other

## 2020-02-13 ENCOUNTER — Ambulatory Visit: Payer: Medicare Other | Admitting: Physical Therapy

## 2020-02-18 ENCOUNTER — Ambulatory Visit: Payer: Medicare Other

## 2020-02-20 ENCOUNTER — Ambulatory Visit: Payer: Medicare Other | Admitting: Physical Therapy

## 2020-02-25 ENCOUNTER — Ambulatory Visit: Payer: Medicare Other | Admitting: Physical Therapy

## 2020-02-28 ENCOUNTER — Ambulatory Visit: Payer: Medicare Other

## 2020-03-02 ENCOUNTER — Ambulatory Visit: Payer: Medicare Other | Admitting: Physical Therapy

## 2020-03-06 ENCOUNTER — Ambulatory Visit: Payer: Medicare Other

## 2020-04-01 ENCOUNTER — Ambulatory Visit: Payer: Medicare Other | Attending: Neurosurgery

## 2020-04-01 ENCOUNTER — Other Ambulatory Visit: Payer: Self-pay

## 2020-04-01 DIAGNOSIS — R269 Unspecified abnormalities of gait and mobility: Secondary | ICD-10-CM | POA: Insufficient documentation

## 2020-04-01 DIAGNOSIS — R2681 Unsteadiness on feet: Secondary | ICD-10-CM | POA: Diagnosis present

## 2020-04-01 NOTE — Therapy (Signed)
[place] MAIN [facility] SERVICES [address] [place], [place], [address] Phone: [phone]   Fax:  [phone]  Physical Therapy Evaluation  Patient Details  Name: Louis Sanchez MRN: [id number] Date of Birth: May 15, 1941 (79 years old) Referring Provider (PT): Tama High, III   Encounter Date: 04/01/2020   PT End of Session - 04/01/20 1733    Visit Number 1    Number of Visits 16    Date for PT Re-Evaluation 05/27/20    Authorization Type 1/10 eval 9/8    PT Start Time 1515    PT Stop Time 1614    PT Time Calculation (min) 59 min    Equipment Utilized During Treatment Gait belt    Activity Tolerance Patient tolerated treatment well    Behavior During Therapy WFL for tasks assessed/performed           Past Medical History:  Diagnosis Date  . Arthritis   . Benign neoplasm of colon 2004  . Benign neoplasm of colon 04/04/2008   32mm tubular adenoma of the ascending colon  . Cancer ([place])    liver and lungs  . Heart disease   . Other and unspecified hyperlipidemia 1997  . Other specified disorder of male genital organs(608.89) 214  . Sleep disturbance, unspecified   . Thyroid disease   . Unspecified hypothyroidism 2005    Past Surgical History:  Procedure Laterality Date  . APPENDECTOMY  1953  . colon polyps  2009  . HERNIA REPAIR  1957   Right  . INGUINAL HERNIA REPAIR Right 2014  . SPINE SURGERY  1997   neck  . WRIST SURGERY      There were no vitals filed for this visit.    Subjective Assessment - 04/01/20 1527    Subjective Patient presents to physical therapy for ataxia and balance.    Pertinent History Patient presents to physical therapy without AD. Reports feet don't go where he wants them to go. Patient reports his balance started to decline about 15 minutes ago. If he looks up he goes "everywhere" Has multiple near falls in the shower. 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  cervical spine MRI from 10/14/2019 , which demonstrates C6/C7 fusion, central stenosis C5/C6 and C4/C5, multiple facet arthritis C3/C4 to C5/C6.    Limitations Walking;Standing;Other (comment)    How long can you sit comfortably? n/a    How long can you stand comfortably? patient reports feeling unsteady initially when standing    How long can you walk comfortably? 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 No/denies              [facility] PT Assessment - 04/01/20 0001      Assessment   Medical Diagnosis ataxia    Referring Provider (PT) Tama High, III    Onset Date/Surgical Date --   15 years ago  Hand Dominance Right    Prior Therapy for cervical fx.       Precautions   Precautions None      Restrictions   Weight Bearing Restrictions No      Balance Screen   Has the patient fallen in the past 6 months No   but 3-4 near falls   Has the patient had a decrease in activity level because of a fear of falling?  Yes    Is the patient reluctant to leave their home because of a fear of falling?  No      Home Social worker Private residence    Living Arrangements Spouse/significant other    Available Help at Discharge Family    Type of [place] Access Level entry    [place];Toilet riser;Grab bars - tub/shower      Prior Function   Level of Independence Independent    Vocation Retired    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     Leisure watch TV, travel (all over the world)      Cognition   Overall Cognitive Status Within Functional Limits for tasks assessed      Observation/Other Assessments   Focus on Therapeutic Outcomes (FOTO)  74    Other Surveys  Activities of Balance and Confidence Scale    Activities of Balance Confidence Scale (ABC Scale)  11.9%      Standardized Balance Assessment   Standardized Balance Assessment Berg Balance Test;Dynamic Gait Index      Berg Balance Test   Sit to Stand Able to stand without using hands and stabilize independently    Standing Unsupported Able to stand safely 2 minutes    Sitting with Back Unsupported but Feet Supported on Floor or Stool Able to sit safely and securely 2 minutes    Stand to Sit Sits safely with minimal use of hands    Transfers Able to transfer safely, minor use of hands    Standing Unsupported with Eyes Closed Able to stand 3 seconds    Standing Unsupported with Feet Together Able to place feet together independently but unable to hold for 30 seconds    From Standing, Reach Forward with Outstretched Arm Reaches forward but needs supervision    From Standing Position, Pick up Object from Floor Able to pick up shoe, needs supervision    From Standing Position, Turn to Look Behind Over each Shoulder Looks behind one side only/other side shows less weight shift    Turn 360 Degrees Able to turn 360 degrees safely in 4 seconds or less    Standing Unsupported, Alternately Place Feet on Step/Stool Needs assistance to keep from falling or unable to try    Standing Unsupported, One Foot in Front Able to take small step independently and hold 30 seconds    Standing on One Leg Able to lift leg independently and  hold 5-10 seconds    Total Score 40      Dynamic Gait Index   Level Surface Normal    Change in Gait Speed Mild Impairment    Gait with Horizontal Head Turns Moderate Impairment    Gait with Vertical Head Turns Mild Impairment    Gait and Pivot Turn Mild Impairment    Step Over Obstacle Moderate Impairment    Step Around Obstacles Mild Impairment  Steps Moderate Impairment    Total Score 14                PAIN: Patient reports no pain  POSTURE:  Seated: slight forward shoulders, kyphotic curvature of thoracic spine with slight L scoliotic curvature appearance ,  Standing: hip shift to right side, shoulders press towards left with fatigue. Feet slightly internally rotated with trunk extended slightly past neutral.   PROM/AROM: Hip extension limited bilaterally: able to obtain neutral but unable to actively extend against gravity in prone.  Passively ~4 degrees hip extension bilaterally with slight knee flexion   STRENGTH:  Graded on a 0-5 scale Muscle Group Left Right  Hip Flex 3+/5 3+/5  Hip Abd 4/5 4/5  Hip Add 4/5 4/5  Hip Ext 2+/5 2+/5  Hip IR/ER /5 /5  Knee Flex 4/5 4/5  Knee Ext 4/5 4/5  Ankle DF 4-/5 3+/5  Ankle PF 4-/5 4-/5   SENSATION:    NEUROLOGICAL SCREEN: (2+ unless otherwise noted.) N=normal  Ab=abnormal   Level Dermatome R L  L2 Medial thigh/groin N N  L3 Lower thigh/med.knee N N  L4 Medial leg/lat thigh N N  L5 Lat. leg & dorsal foot N N  S1 post/lat foot/thigh/leg N N  S2 Post./med. thigh & leg N N    SOMATOSENSORY:  Any N & T in extremities or weakness: reports :         Sensation           Intact      Diminished         Absent  Light touch LEs                              COORDINATION: Finger to Nose: Dysmetric  with slight passing point of L and R Heel slide test: functional L, slight dysmetria with R  FUNCTIONAL MOBILITY: STS: CGA/Mod I able to perform without UE support, slight excessive trunk extension resulting in weight  through heels and near instability   Sitting<>prone: able to perform with mod I for additional time   BALANCE: Dynamic Sitting Balance  Normal Able to sit unsupported and weight shift across midline maximally   Good Able to sit unsupported and weight shift across midline moderately x  Good-/Fair+ Able to sit unsupported and weight shift across midline minimally   Fair Minimal weight shifting ipsilateral/front, difficulty crossing midline   Fair- Reach to ipsilateral side and unable to weight shift   Poor + Able to sit unsupported with min A and reach to ipsilateral side, unable to weight shift   Poor Able to sit unsupported with mod A and reach ipsilateral/front-can't cross midline     Standing Dynamic Balance  Normal Stand independently unsupported, able to weight shift and cross midline maximally   Good Stand independently unsupported, able to weight shift and cross midline moderately   Good-/Fair+ Stand independently unsupported, able to weight shift across midline minimally   Fair Stand independently unsupported, weight shift, and reach ipsilaterally, loss of balance when crossing midline x  Poor+ Able to stand with Min A and reach ipsilaterally, unable to weight shift   Poor Able to stand with Mod A and minimally reach ipsilaterally, unable to cross midline.       Static Sitting Balance  Normal Able to maintain balance against maximal resistance   Good Able to maintain balance against moderate resistance x  Good-/Fair+ Accepts minimal resistance  Fair Able to sit unsupported without balance loss and without UE support   Poor+ Able to maintain with Minimal assistance from individual or chair   Poor Unable to maintain balance-requires mod/max support from individual or chair     Static Standing Balance  Normal Able to maintain standing balance against maximal resistance   Good Able to maintain standing balance against moderate resistance   Good-/Fair+ Able to maintain  standing balance against minimal resistance   Fair Able to stand unsupported without UE support and without LOB for 1-2 min x  Fair- Requires Min A and UE support to maintain standing without loss of balance   Poor+ Requires mod A and UE support to maintain standing without loss of balance   Poor Requires max A and UE support to maintain standing balance without loss       GAIT: Patient ambulates without AD, has bilateral foot clearance however as patient fatigues increased internal rotation bilaterally noted as well as decreased heel strike and toe off.    OUTCOME MEASURES: TEST Outcome Interpretation  FOTO 74% Discharge predicted score of 77%  ABC 11.9%       6 minute walk test         1640       Feet 1000 feet is community Water quality scientist 40/56 <36/56 (100% risk for falls), 37-45 (80% risk for falls); 46-51 (>50% risk for falls); 52-55 (lower risk <25% of falls)  DGI 14/24        Access Code: [email or web address] URL: [email or web address] Date: 04/01/2020 Prepared by: Janna Arch  Exercises Standing Tandem Balance with Counter Support - 1 x daily - 7 x weekly - 2 sets - 2 reps - 30 hold Standing Single Leg Stance with Counter Support - 1 x daily - 7 x weekly - 2 sets - 2 reps - 30 hold Standing March with Counter Support - 1 x daily - 7 x weekly - 2 sets - 10 reps - 5 hold Standing Hip Extension with Counter Support - 1 x daily - 7 x weekly - 2 sets - 10 reps - 5 hold     Objective measurements completed on examination: See above findings.             PT Education - 04/01/20 1733    Education Details goals, POC, HEP    Person(s) Educated Patient;Spouse    Methods Explanation;Demonstration;Tactile cues;Verbal cues;Handout    Comprehension Verbalized understanding;Returned demonstration;Verbal cues required;Tactile cues required            PT Short Term Goals - 04/01/20 1737      PT SHORT TERM GOAL #1   Title Patient will be  independent in home exercise program to improve strength/mobility for better functional independence with ADLs.    Baseline 9/8 : HEP given    Time 4    Period Weeks    Status New    Target Date 04/29/20             PT Long Term Goals - 04/01/20 1738      PT LONG TERM GOAL #1   Title Patient will increase FOTO score to equal to or greater than 77%  to demonstrate statistically significant improvement in mobility and quality of life.    Baseline 9/8: 74%    Time 8    Period Weeks    Status New    Target Date 05/27/20      PT LONG TERM GOAL #2   Title  Patient will demonstrate an improved Berg Balance Score of > 46/56 as to demonstrate improved balance with ADLs such as sitting/standing and transfer balance and reduced fall risk.    Baseline 9/8: 40/56    Time 8    Period Weeks    Status New    Target Date 05/27/20      PT LONG TERM GOAL #3   Title Patient will increase dynamic gait index score to >19/24 as to demonstrate reduced fall risk and improved dynamic gait balance for better safety with community/home ambulation.    Baseline 9/8: 14/24    Time 8    Period Weeks    Status New    Target Date 05/27/20      PT LONG TERM GOAL #4   Title Patient will increase ABC scale score >80% to demonstrate better functional mobility and better confidence with ADLs.    Baseline 9/8: 11.9%    Time 8    Period Weeks    Status New    Target Date 05/27/20                  Plan - 04/01/20 1734    Clinical Impression Statement Patient is a pleasant 79 year old male who presents to physical therapy for ataxia. Patient is ambulatory at baseline without an AD however has deficits that result in near LOB but not falls. Patient is limited in stabilization on single limb with carryover to ambulation with fatigued. Patient has slight excess of anterior pelvic rotation in combination with limited hip flexor length bilaterally and compensates with excessive thoracic extension resulting  in weight shift to posterior heels and instability. Patient is challenged with ankle and hip righting reactions and will be area for further intervention. Patient will benefit from skilled physical therapy to increase balance, posture, and mobility for decreased fall risk and improved quality of life.    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    Examination-Activity Limitations Bathing;Carry;Locomotion Level;Reach Overhead;Squat;Stairs;Transfers    Stability/Clinical Decision Making Evolving/Moderate complexity    Clinical Decision Making Moderate    Rehab Potential Fair    PT Frequency 2x / week    PT Duration 8 weeks    PT Treatment/Interventions ADLs/Self Care Home Management;Therapeutic activities;Therapeutic exercise;Balance training;Neuromuscular re-education;Cognitive remediation;Manual techniques;Dry needling;Passive range of motion;Aquatic Therapy;Biofeedback;Canalith Repostioning;Cryotherapy;Moist Heat;Traction;DME Instruction;Gait training;Stair training;Functional mobility training;Patient/family education;Manual lymph drainage;Compression bandaging;Energy conservation;Splinting;Taping;Vestibular;Visual/perceptual remediation/compensation    PT Next Visit Plan review HEP, single leg stance, sit to stands from recliner, high level balance    PT Home Exercise Plan see above    Consulted and Agree with Plan of Care  Patient;Family member/caregiver    Family Member Consulted wife, Joycelyn Schmid           Patient will benefit from skilled therapeutic intervention in order to improve the following deficits and impairments:  Postural dysfunction, Increased muscle spasms, Decreased range of motion, Decreased balance, Difficulty walking, Impaired flexibility, Abnormal gait, Cardiopulmonary status limiting activity, Decreased activity tolerance, Decreased endurance, Decreased coordination, Decreased strength, Impaired perceived functional ability, Improper body mechanics, Impaired vision/preception  Visit Diagnosis: Unsteadiness on feet  Abnormality of gait and mobility     Problem List Patient Active Problem List   Diagnosis Date Noted  . Mitral regurgitation 09/20/2016  . TIA (transient ischemic attack) 09/20/2016  . Liver masses 09/13/2016  . Acute bronchitis 09/13/2016  . Leukocytosis 09/13/2016  . Venous stasis dermatitis 09/13/2016  . LFT elevation   . Alcoholic gastritis   . Atrial fibrillation with RVR ([place]) 09/11/2016  . Acute lower GI bleeding 05/23/2016  . 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 Hematochezia 05/23/2016  . History of hypothyroidism 05/23/2016  . Chronic atrial fibrillation ([place]) 05/03/2016  . Ruptured varicose vein 01/25/2016  . Varicose veins of both lower extremities with complications 56/38/7564  . 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 Temporary cerebral vascular dysfunction 08/25/2014  . Inguinal hernia 02/18/2013  . Actinic keratosis 01/21/2010  . 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 Benign neoplasm of colon 04/04/2008   Janna Arch, PT, DPT   04/01/2020, 5:42 PM  [place] MAIN [facility] SERVICES [address] [place], [place], [address] Phone: [phone]   Fax:  [phone]  Name: DEWELL MONNIER MRN: [id number] Date of Birth: Dec 01, 1940

## 2020-04-01 NOTE — Patient Instructions (Signed)
Access Code: [email or web address] URL: [email or web address] Date: 04/01/2020 Prepared by: Janna Arch  Exercises Standing Tandem Balance with Counter Support - 1 x daily - 7 x weekly - 2 sets - 2 reps - 30 hold Standing Single Leg Stance with Counter Support - 1 x daily - 7 x weekly - 2 sets - 2 reps - 30 hold Standing March with Counter Support - 1 x daily - 7 x weekly - 2 sets - 10 reps - 5 hold Standing Hip Extension with Counter Support - 1 x daily - 7 x weekly - 2 sets - 10 reps - 5 hold

## 2020-04-06 ENCOUNTER — Other Ambulatory Visit: Payer: Self-pay

## 2020-04-06 ENCOUNTER — Ambulatory Visit: Payer: Medicare Other

## 2020-04-06 DIAGNOSIS — R269 Unspecified abnormalities of gait and mobility: Secondary | ICD-10-CM

## 2020-04-06 DIAGNOSIS — R2681 Unsteadiness on feet: Secondary | ICD-10-CM | POA: Diagnosis not present

## 2020-04-06 NOTE — Therapy (Signed)
[place] [facility]IN [facility] SERVICES [address] [place], [place], [address] Phone: [phone]   Fax:  [phone]  Physical Therapy Treatment  Patient Details  Name: DEMPSY Sanchez MRN: [id number] Date of Birth: Jun 19, 1941 Referring Provider (PT): Tama High, III   Encounter Date: 04/06/2020   PT End of Session - 04/06/20 1743    Visit Number 2    Number of Visits 16    Date for PT Re-Evaluation 05/27/20    Authorization Type 2/10 eval 9/8    PT Start Time 1645    PT Stop Time 1729    PT Time Calculation (min) 44 min    Equipment Utilized During Treatment Gait belt    Activity Tolerance Patient tolerated treatment well    Behavior During Therapy WFL for tasks assessed/performed           Past Medical History:  Diagnosis Date  . Arthritis   . Benign neoplasm of colon 2004  . Benign neoplasm of colon 04/04/2008   29mm tubular adenoma of the ascending colon  . Cancer ([place])    liver and lungs  . Heart disease   . Other and unspecified hyperlipidemia 1997  . Other specified disorder of male genital organs(608.89) 214  . Sleep disturbance, unspecified   . Thyroid disease   . Unspecified hypothyroidism 2005    Past Surgical History:  Procedure Laterality Date  . APPENDECTOMY  1953  . colon polyps  2009  . HERNIA REPAIR  1957   Right  . INGUINAL HERNIA REPAIR Right 2014  . SPINE SURGERY  1997   neck  . WRIST SURGERY      There were no vitals filed for this visit.   Subjective Assessment - 04/06/20 1648    Subjective Patient denies falls or LOB since last session. Patient reports performing HEP twice.    Pertinent History Patient presents to physical therapy without AD. Reports feet don't go where he wants them to go. Patient reports his balance started to decline about 15 minutes ago. If he looks up he goes "everywhere" Has multiple near falls in the shower. 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  cervical spine MRI from 10/14/2019 , which demonstrates C6/C7 fusion, central stenosis C5/C6 and C4/C5, multiple facet arthritis C3/C4 to C5/C6.    Limitations Walking;Standing;Other (comment)    How long can you sit comfortably? n/a    How long can you stand comfortably? patient reports feeling unsteady initially when standing    How long can you walk comfortably? 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 No/denies           Treatment:  - Step taps on 6" step with focus on neutral foot placement. Visual targets provided for consistency of foot placement. 15x each foot. CGA for safety.  - Sit to stand with Airex pad on surface to mimic recliner. SPT  brings mat table to various heights to challenge task. 10x from chair, 10x from low mat table. - Seated rows to promote proper upright posture. Red resistance band 15x - Obstacle course consisting of hurdles, cones, and Airex pad to increase coordination and balance in distracting environments. Performed 4x at [facility]. - Stepping over hurdle with visual targets to promote consistent stepping. Ruler placed between feet to decrease NBOS. 10x each leg. CGA for safety. - Shuffle skaters in // bars to promote balance in unilateral stance. CGA for safety. Performed 10x each  leg (20x total) with BUE support. Performed additional 10x each leg (20x total) with LUE support. - Agility ladder stepping to facilitate proper gait pattern. SPT cues for wider BOS to prevent falls. Performed 4x at [place].      -      Reviewed HEP from evaluation. Patient able to recall 2/4 exercises. Performed 1 set of each exercise for each leg.  Patient doffs L shoe during treatment to show SPT foot abnormality. Patients 2nd toe crosses over great toe, causing patient pain in prolonged stance. SPT educates on option of orthotic to alleviate pain.  Pt educated throughout session about proper posture and technique with exercises. Improved exercise technique, movement at target joints, use of target muscles after min to mod verbal, visual, tactile cues.    Access Code: [email or web address] URL: [email or web address]  Date: 04/06/2020  Prepared by: Janna Arch  Exercises  Standing Row with Anchored Resistance - 1 x daily - 7 x weekly - 2 sets - 10 reps - 5 hold                        PT Education - 04/06/20 1742    Education Details pacing, exercise technique, body mechanics    Person(s) Educated Patient    Methods Explanation;Demonstration;Tactile cues;Verbal cues;Handout    Comprehension Verbalized understanding;Verbal cues required;Tactile cues required;Returned demonstration            PT Short Term Goals - 04/01/20 1737      PT SHORT TERM GOAL #1   Title Patient will be independent in home exercise program to improve strength/mobility for better functional independence with ADLs.    Baseline 9/8 : HEP given    Time 4    Period Weeks    Status New    Target Date 04/29/20             PT Long Term Goals - 04/01/20 1738      PT LONG TERM GOAL #1   Title Patient will increase FOTO score to equal to or greater than 77%  to demonstrate statistically significant improvement in mobility and quality of life.    Baseline 9/8: 74%    Time 8    Period Weeks     Status New    Target Date 05/27/20      PT LONG TERM GOAL #2   Title Patient will demonstrate an improved Berg Balance Score of > 46/56 as to demonstrate improved balance with ADLs such as sitting/standing and transfer balance and reduced fall risk.    Baseline 9/8: 40/56    Time 8    Period Weeks    Status New    Target Date 05/27/20      PT LONG TERM GOAL #3   Title Patient will increase dynamic gait index score to >19/24 as to demonstrate reduced fall risk and improved dynamic gait balance for better safety with community/home ambulation.  Baseline 9/8: 14/24    Time 8    Period Weeks    Status New    Target Date 05/27/20      PT LONG TERM GOAL #4   Title Patient will increase ABC scale score >80% to demonstrate better functional mobility and better confidence with ADLs.    Baseline 9/8: 11.9%    Time 8    Period Weeks    Status New    Target Date 05/27/20                 Plan - 04/06/20 1750    Clinical Impression Statement Patient demonstrates potential for carryover of cues during session, but continues to require cues to maintain proper form. Patient continues to demonstrate trunk extension past neutral, causing instability. Patient displays improved foot placement throughout session with minimal instances of foot IR bilaterally. Patient requires mod to max verbal cues to keep on task due to conversational nature. Patient will benefit from skilled physical therapy to increase balance, posture, and mobility for decreased fall risk and improved quality of life.    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    Examination-Activity Limitations Bathing;Carry;Locomotion Level;Reach Overhead;Squat;Stairs;Transfers    Stability/Clinical Decision Making Evolving/Moderate complexity    Rehab Potential Fair    PT Frequency 2x / week    PT Duration 8 weeks    PT Treatment/Interventions ADLs/Self Care Home Management;Therapeutic activities;Therapeutic exercise;Balance training;Neuromuscular re-education;Cognitive remediation;Manual techniques;Dry needling;Passive range of motion;Aquatic Therapy;Biofeedback;Canalith Repostioning;Cryotherapy;Moist Heat;Traction;DME Instruction;Gait training;Stair training;Functional mobility training;Patient/family education;Manual lymph drainage;Compression bandaging;Energy conservation;Splinting;Taping;Vestibular;Visual/perceptual remediation/compensation    PT Next Visit Plan neutral trunk management, high level balance    PT Home Exercise Plan see above    Consulted and Agree with Plan of Care Patient;Family member/caregiver    Family Member Consulted wife, Joycelyn Schmid           Patient will benefit from skilled therapeutic intervention in order to improve the following deficits and impairments:  Postural dysfunction, Increased muscle spasms, Decreased range of motion, Decreased balance, Difficulty walking, Impaired flexibility, Abnormal gait, Cardiopulmonary status limiting activity, Decreased activity tolerance, Decreased endurance, Decreased coordination, Decreased strength, Impaired perceived functional ability, Improper body mechanics, Impaired vision/preception  Visit Diagnosis: Unsteadiness on feet  Abnormality of gait and mobility     Problem List Patient Active Problem List   Diagnosis Date Noted  . Mitral regurgitation 09/20/2016  . TIA (transient  ischemic attack) 09/20/2016  . Liver masses 09/13/2016  . Acute bronchitis 09/13/2016  . Leukocytosis 09/13/2016  . Venous stasis dermatitis 09/13/2016  . LFT elevation   . Alcoholic gastritis   . Atrial fibrillation with RVR ([place]) 09/11/2016  . Acute lower GI bleeding 05/23/2016  . 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 Hematochezia 05/23/2016  . History of hypothyroidism 05/23/2016  . Chronic atrial fibrillation ([place]) 05/03/2016  . Ruptured varicose vein 01/25/2016  . Varicose veins of both lower extremities with complications 30/16/0109  . 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 Temporary cerebral vascular dysfunction 08/25/2014  . Inguinal hernia 02/18/2013  . Actinic keratosis 01/21/2010  . 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  Benign neoplasm of colon 04/04/2008    Tonny Bollman, SPT  This entire session was performed under direct supervision and direction of a licensed therapist/therapist assistant . I have personally read, edited and approve of the note as written.  Janna Arch, PT, DPT   04/06/2020, 5:53 PM  [place] MAIN [facility] [address] [place], [place], [address] Phone: [phone]   Fax:  [phone]  Name: Louis Sanchez MRN: [id number] Date of Birth: 07/08/1941

## 2020-04-06 NOTE — Patient Instructions (Signed)
Access Code: [email or web address]  URL: [email or web address]  Date: 04/06/2020  Prepared by: Janna Arch  Exercises: Standing Row with Anchored Resistance - 1 x daily - 7 x weekly - 2 sets - 10 reps - 5 hold

## 2020-04-08 ENCOUNTER — Ambulatory Visit: Payer: Medicare Other

## 2020-04-08 ENCOUNTER — Other Ambulatory Visit: Payer: Self-pay

## 2020-04-08 DIAGNOSIS — J90 Pleural effusion, not elsewhere classified: Secondary | ICD-10-CM | POA: Insufficient documentation

## 2020-04-08 DIAGNOSIS — R2681 Unsteadiness on feet: Secondary | ICD-10-CM | POA: Diagnosis not present

## 2020-04-08 DIAGNOSIS — R269 Unspecified abnormalities of gait and mobility: Secondary | ICD-10-CM

## 2020-04-08 DIAGNOSIS — J948 Other specified pleural conditions: Secondary | ICD-10-CM | POA: Insufficient documentation

## 2020-04-08 NOTE — Therapy (Signed)
[place] MAIN [facility] SERVICES [address] [place], [place], [address] Phone: [phone]   Fax:  [phone]  Physical Therapy Treatment  Patient Details  Name: Louis Sanchez MRN: [id number] Date of Birth: June 23, 1941 Referring Provider (PT): Tama High, III   Encounter Date: 04/08/2020   PT End of Session - 04/08/20 1735    Visit Number 3    Number of Visits 16    Date for PT Re-Evaluation 05/27/20    Authorization Type 3/10 eval 9/8    PT Start Time 1644    PT Stop Time 1729    PT Time Calculation (min) 45 min    Equipment Utilized During Treatment Gait belt    Activity Tolerance Patient tolerated treatment well    Behavior During Therapy WFL for tasks assessed/performed           Past Medical History:  Diagnosis Date  . Arthritis   . Benign neoplasm of colon 2004  . Benign neoplasm of colon 04/04/2008   46mm tubular adenoma of the ascending colon  . Cancer ([place])    liver and lungs  . Heart disease   . Other and unspecified hyperlipidemia 1997  . Other specified disorder of male genital organs(608.89) 214  . Sleep disturbance, unspecified   . Thyroid disease   . Unspecified hypothyroidism 2005    Past Surgical History:  Procedure Laterality Date  . APPENDECTOMY  1953  . colon polyps  2009  . HERNIA REPAIR  1957   Right  . INGUINAL HERNIA REPAIR Right 2014  . SPINE SURGERY  1997   neck  . WRIST SURGERY      There were no vitals filed for this visit.   Subjective Assessment - 04/08/20 1646    Subjective Patient denies falls or LOB since last session. Patient notes performing exercises more often since last visit. Patient notes cancer spotting on liver. He has to cancel some appts next week to get PET scans.    Pertinent History Patient presents to physical therapy without AD. Reports feet don't go where he wants them to go. Patient reports his balance started to decline about 15 minutes ago. If he looks up he goes  "everywhere" Has multiple near falls in the shower. 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  cervical spine MRI from 10/14/2019 , which demonstrates C6/C7 fusion, central stenosis C5/C6 and C4/C5, multiple facet arthritis C3/C4 to C5/C6.    Limitations Walking;Standing;Other (comment)    How long can you sit comfortably? n/a    How long can you stand comfortably? patient reports feeling unsteady initially when standing    How long can you walk comfortably? 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 No/denies             Treatment:  Sit to stand with Airex pad on surface to mimic recliner. 10x from low mat table.  Seated rows to promote proper upright posture. Green resistance band 15x  Obstacle course consisting of hurdles, foam balance beam, and Airex pad to increase coordination and balance in distracting environments. Performed 4x at [facility].  Stepping over hurdle with visual targets to promote consistent stepping. Ruler placed between feet to decrease NBOS. 12x each leg. CGA for safety.  - A/P weight shifts with mini lunge over front foot to promote forward trunk lean for neutral alignment. Patient requires mod verbal and tactile cues during task, as pt would squat down  instead of weight shift. 12x each leg.  - Ball kicks to challenge balance, coordination, and sequencing of movement. Patient demonstrates near LOB x 5 but is able to self-stabilize using ankle or step strategies. 15 x 2 sets.  Standing in // Bars: - Side stepping with single UE support. CGA for safety. 4x  - Braiding with single UE support. Patient does not display LOB throughout task. 4x  - Forward/backward on rockerboard to promote forward lean for neutral trunk alignment. BUE support for safety. Utilized Geologist, engineering for self correction of trunk extension. 12x each leg.     Pt educated throughout session about proper posture and technique with exercises. Improved exercise technique, movement at target joints, use of target muscles after min to mod verbal, visual, tactile cues.                     PT Education - 04/08/20 1736    Education Details pacing, exercise technique, midline awareness, body mechanics    Person(s) Educated Patient    Methods Explanation;Demonstration;Tactile cues;Verbal cues    Comprehension Verbalized understanding;Returned demonstration;Verbal cues required;Tactile cues required            PT Short Term Goals - 04/01/20 1737      PT SHORT TERM GOAL #1   Title Patient will be independent in home exercise program to improve strength/mobility for better functional independence with ADLs.    Baseline 9/8 : HEP given    Time 4    Period Weeks    Status New    Target Date 04/29/20             PT Long Term Goals - 04/01/20 1738      PT LONG TERM GOAL #1   Title Patient will increase FOTO score to equal to or greater than 77%  to demonstrate statistically significant improvement in mobility and quality of life.    Baseline 9/8: 74%    Time 8    Period Weeks    Status New    Target Date 05/27/20      PT LONG TERM GOAL #2   Title Patient will demonstrate an improved Berg Balance Score of > 46/56 as to demonstrate improved balance with  ADLs such as sitting/standing and transfer balance and reduced fall risk.    Baseline 9/8: 40/56    Time 8    Period Weeks    Status New    Target Date 05/27/20      PT LONG TERM GOAL #3   Title Patient will increase dynamic gait index score to >19/24 as to demonstrate reduced fall risk and improved dynamic gait balance for better safety with community/home ambulation.    Baseline 9/8: 14/24    Time 8    Period Weeks    Status New    Target Date 05/27/20      PT LONG TERM GOAL #4   Title Patient will increase ABC scale score >  80% to demonstrate better functional mobility and better confidence with ADLs.    Baseline 9/8: 11.9%    Time 8    Period Weeks    Status New    Target Date 05/27/20                 Plan - 04/08/20 1745    Clinical Impression Statement Patient demonstrates improved balance this session and is able to stabilize without PT assist. SPT educates on pt's presentation of trunk extension throughout session and how it can lead to LOB. Patient continues to require verbal cues for pacing of exercise in order to control movements and reduce fall risk. Patient will benefit from skilled physical therapy to increase balance, posture, and mobility for decreased fall risk and improved quality of life.    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    Examination-Activity Limitations  Bathing;Carry;Locomotion Level;Reach Overhead;Squat;Stairs;Transfers    Stability/Clinical Decision Making Evolving/Moderate complexity    Rehab Potential Fair    PT Frequency 2x / week    PT Duration 8 weeks    PT Treatment/Interventions ADLs/Self Care Home Management;Therapeutic activities;Therapeutic exercise;Balance training;Neuromuscular re-education;Cognitive remediation;Manual techniques;Dry needling;Passive range of motion;Aquatic Therapy;Biofeedback;Canalith Repostioning;Cryotherapy;Moist Heat;Traction;DME Instruction;Gait training;Stair training;Functional mobility training;Patient/family education;Manual lymph drainage;Compression bandaging;Energy conservation;Splinting;Taping;Vestibular;Visual/perceptual remediation/compensation    PT Next Visit Plan neutral trunk management, high level balance    PT Home Exercise Plan see above    Consulted and Agree with Plan of Care Patient;Family member/caregiver    Family Member Consulted wife, Joycelyn Schmid           Patient will benefit from skilled therapeutic intervention in order to improve the following deficits and impairments:  Postural dysfunction, Increased muscle spasms, Decreased range of motion, Decreased balance, Difficulty walking, Impaired flexibility, Abnormal gait, Cardiopulmonary status limiting activity, Decreased activity tolerance, Decreased endurance, Decreased coordination, Decreased strength, Impaired perceived functional ability, Improper body mechanics, Impaired vision/preception  Visit Diagnosis: Unsteadiness on feet  Abnormality of gait and mobility     Problem List Patient Active Problem List   Diagnosis Date Noted  . Mitral regurgitation 09/20/2016  . TIA (transient ischemic attack) 09/20/2016  . Liver masses 09/13/2016  . Acute bronchitis 09/13/2016  . Leukocytosis 09/13/2016  . Venous stasis dermatitis 09/13/2016  . LFT elevation   . Alcoholic gastritis   . Atrial fibrillation with RVR ([place]) 09/11/2016   . Acute lower GI bleeding 05/23/2016  . 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 Hematochezia 05/23/2016  . History of hypothyroidism 05/23/2016  . Chronic atrial fibrillation ([place]) 05/03/2016  . Ruptured varicose vein 01/25/2016  . Varicose veins of both lower extremities with complications 33/82/5053  . 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 Temporary cerebral vascular dysfunction 08/25/2014  . Inguinal hernia 02/18/2013  . Actinic keratosis 01/21/2010  . 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 Benign neoplasm of colon 04/04/2008   Tonny Bollman, SPT This entire session was performed under direct supervision and direction of a licensed therapist/therapist assistant . I have personally read, edited and approve of the note as written. Janna Arch, PT, DPT   04/08/2020, 5:47 PM  [place] MAIN [facility] SERVICES [address] [place], [place], [address] Phone: [phone]   Fax:  [phone]  Name: Louis Sanchez MRN: [id number] Date of Birth: [birth year]

## 2020-04-15 ENCOUNTER — Ambulatory Visit: Payer: Medicare Other

## 2020-04-20 ENCOUNTER — Ambulatory Visit: Payer: Medicare Other

## 2020-04-27 ENCOUNTER — Ambulatory Visit: Payer: Medicare Other

## 2020-04-29 ENCOUNTER — Ambulatory Visit: Payer: Medicare Other | Attending: Neurosurgery

## 2020-04-29 ENCOUNTER — Other Ambulatory Visit: Payer: Self-pay

## 2020-04-29 DIAGNOSIS — M542 Cervicalgia: Secondary | ICD-10-CM | POA: Insufficient documentation

## 2020-04-29 DIAGNOSIS — R293 Abnormal posture: Secondary | ICD-10-CM | POA: Insufficient documentation

## 2020-04-29 DIAGNOSIS — R269 Unspecified abnormalities of gait and mobility: Secondary | ICD-10-CM

## 2020-04-29 DIAGNOSIS — R2681 Unsteadiness on feet: Secondary | ICD-10-CM

## 2020-04-29 NOTE — Therapy (Signed)
[place] MAIN [facility] SERVICES [address] [place], [place], [address] Phone: [phone]   Fax:  [phone]  Physical Therapy Treatment  Patient Details  Name: Louis Sanchez MRN: [id number] Date of Birth: 11/09/1940 Referring Provider (PT): Tama High, III   Encounter Date: 04/29/2020   PT End of Session - 04/29/20 1658    Visit Number 4    Number of Visits 16    Date for PT Re-Evaluation 05/27/20    Authorization Type 4/10 eval 9/8    PT Start Time 1600    PT Stop Time 1644    PT Time Calculation (min) 44 min    Equipment Utilized During Treatment Gait belt    Activity Tolerance Patient tolerated treatment well    Behavior During Therapy WFL for tasks assessed/performed           Past Medical History:  Diagnosis Date  . Arthritis   . Benign neoplasm of colon 2004  . Benign neoplasm of colon 04/04/2008   33mm tubular adenoma of the ascending colon  . Cancer ([place])    liver and lungs  . Heart disease   . Other and unspecified hyperlipidemia 1997  . Other specified disorder of male genital organs(608.89) 214  . Sleep disturbance, unspecified   . Thyroid disease   . Unspecified hypothyroidism 2005    Past Surgical History:  Procedure Laterality Date  . APPENDECTOMY  1953  . colon polyps  2009  . HERNIA REPAIR  1957   Right  . INGUINAL HERNIA REPAIR Right 2014  . SPINE SURGERY  1997   neck  . WRIST SURGERY      There were no vitals filed for this visit.   Subjective Assessment - 04/29/20 1602    Subjective Patient has been undergoing cancer treatments in time away from therapy. Patient notes he has only performed HEP twice in his absence. Denies falls or LOB since last session.    Pertinent History Patient presents to physical therapy without AD. Reports feet don't go where he wants them to go. Patient reports his balance started to decline about 15 minutes ago. If he looks up he goes "everywhere" Has multiple near  falls in the shower. 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  cervical spine MRI from 10/14/2019 , which demonstrates C6/C7 fusion, central stenosis C5/C6 and C4/C5, multiple facet arthritis C3/C4 to C5/C6.    Limitations Walking;Standing;Other (comment)    How long can you sit comfortably? n/a    How long can you stand comfortably? patient reports feeling unsteady initially when standing    How long can you walk comfortably? 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 No/denies           Goals: DGI: 19/24 Berg: 46/56 ABC: 84.4%  Treatment:   Seated rows to promote upright posture. Blue resistance band 15x each side  Seated horizontal  abduction with blue resistance band. 15x with 2 second holds.   Stepping up/over hurdle to challenge coordination and balance. Performed 12x each leg leading. CGA for safety. Patient demonstrates near LOB x 5 due to failure to clear foot over hurdle. Patient places feet in NBOS at resting position, causing slight unsteadiness with no LOB.     Forward step over hurdle with weight shift on front foot to promote forward trunk flexion. Performed 12x each leg. CGA and chair behind for safety.   Standing in // Bars:   Rockerboard balance 45 seconds  A/P, 45 seconds M/L. PT cues for slight knee flexion during task to challenge balance. Patient leans L during M/L balance, with inc effort to return to neutral.  On Airex pad: normal BOS with horizontal head turns 45 seconds, static NBOS 45 seconds. Patient demonstrates backwards displacement due to trunk extension but is able to stabilize with hip strategy   Pt educated throughout session about proper posture and technique with exercises. Improved exercise technique, movement at target joints, use of target muscles after min to mod verbal, visual, tactile cues.    will assess need for progression of goals next session.                       PT Education - 04/29/20 1658    Education Details goals, body mechanics, exercise technique, breathing    Person(s) Educated Patient    Methods Explanation;Demonstration;Tactile cues;Verbal cues    Comprehension Verbalized understanding;Tactile cues required;Returned demonstration;Verbal cues required            PT Short Term Goals - 04/01/20 1737      PT SHORT TERM GOAL #1   Title Patient will be independent in home exercise program to improve strength/mobility for better functional independence with ADLs.    Baseline 9/8 : HEP given    Time 4    Period Weeks    Status New    Target Date 04/29/20             PT Long Term Goals - 04/29/20 1710      PT LONG TERM GOAL #1   Title Patient will increase FOTO score to equal to or greater than 77%  to demonstrate statistically significant improvement in mobility and quality of life.    Baseline 9/8: 74%    Time 8    Period Weeks    Status New    Target Date 05/27/20      PT LONG TERM GOAL #2   Title Patient will demonstrate an improved Berg Balance Score of > 46/56 as to demonstrate improved balance with ADLs such as sitting/standing and transfer balance and reduced fall risk.    Baseline 9/8: 40/56 10/6: 46/56    Time 8    Period Weeks    Status New    Target Date  05/27/20      PT LONG TERM GOAL #3   Title Patient will increase dynamic gait index score to >19/24 as to demonstrate reduced fall risk and improved dynamic gait balance for better safety with community/home ambulation.    Baseline 9/8: 14/24 10/6: 19/24    Time 8    Period Weeks    Status New      PT LONG TERM GOAL #4   Title Patient will increase ABC scale score >80% to demonstrate better functional mobility and better confidence with ADLs.    Baseline 9/8: 11.9% 10/6: 84.4%    Time 8  Period Weeks    Status New    Target Date 05/27/20                 Plan - 04/29/20 1701    Clinical Impression Statement Patient demonstrates improved balance despite hiatus from therapy sessions, indicated by Merrilee Jansky and ABC scores. Patient continues to demonstrate trunk extension in static stance, causing posterior displacement of balance. Patient requires inc effort and time to find COM with forward flexion. Patient appears aware of limits that could potentially cause fall risks at home. Patient will benefit from skilled physical therapy to increase balance, posture, and mobility for decreased fall risk and improved quality of life.    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    Examination-Activity Limitations Bathing;Carry;Locomotion Level;Reach Overhead;Squat;Stairs;Transfers    Stability/Clinical Decision Making  Evolving/Moderate complexity    Rehab Potential Fair    PT Frequency 2x / week    PT Duration 8 weeks    PT Treatment/Interventions ADLs/Self Care Home Management;Therapeutic activities;Therapeutic exercise;Balance training;Neuromuscular re-education;Cognitive remediation;Manual techniques;Dry needling;Passive range of motion;Aquatic Therapy;Biofeedback;Canalith Repostioning;Cryotherapy;Moist Heat;Traction;DME Instruction;Gait training;Stair training;Functional mobility training;Patient/family education;Manual lymph drainage;Compression bandaging;Energy conservation;Splinting;Taping;Vestibular;Visual/perceptual remediation/compensation    PT Next Visit Plan neutral trunk management, high level balance    PT Home Exercise Plan see above    Consulted and Agree with Plan of Care Patient;Family member/caregiver    Family Member Consulted wife, Joycelyn Schmid           Patient will benefit from skilled therapeutic intervention in order to improve the following deficits and impairments:  Postural dysfunction, Increased muscle spasms, Decreased range of motion, Decreased balance, Difficulty walking, Impaired flexibility, Abnormal gait, Cardiopulmonary status limiting activity, Decreased activity tolerance, Decreased endurance, Decreased coordination, Decreased strength, Impaired perceived functional ability, Improper body mechanics, Impaired vision/preception  Visit Diagnosis: Unsteadiness on feet  Abnormality of gait and mobility     Problem List Patient Active Problem List   Diagnosis Date Noted  . Mitral regurgitation 09/20/2016  . TIA (transient ischemic attack) 09/20/2016  . Liver masses 09/13/2016  . Acute bronchitis 09/13/2016  . Leukocytosis 09/13/2016  . Venous stasis dermatitis 09/13/2016  . LFT elevation   . Alcoholic gastritis   . Atrial fibrillation with RVR ([place]) 09/11/2016  . Acute lower GI bleeding 05/23/2016  . 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 Hematochezia 05/23/2016  . History  of hypothyroidism 05/23/2016  . Chronic atrial fibrillation ([place]) 05/03/2016  . Ruptured varicose vein 01/25/2016  . Varicose veins of both lower extremities with complications 27/78/2423  . 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 Temporary cerebral vascular dysfunction 08/25/2014  . Inguinal hernia 02/18/2013  . Actinic keratosis 01/21/2010  . 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 Benign neoplasm of colon 04/04/2008   Tonny Bollman, SPT  This entire session was performed under direct supervision and direction of a licensed therapist/therapist assistant . I have personally read, edited and approve of the note as written.  Janna Arch, PT, DPT   04/29/2020, 5:11 PM  [place] MAIN [facility] SERVICES [address] [place], [place], [address] Phone: [phone]  Fax:  [phone]  Name: Louis Sanchez MRN: [id number] Date of Birth: 03/01/1941

## 2020-05-04 ENCOUNTER — Ambulatory Visit: Payer: Medicare Other

## 2020-05-04 ENCOUNTER — Other Ambulatory Visit: Payer: Self-pay

## 2020-05-04 DIAGNOSIS — R2681 Unsteadiness on feet: Secondary | ICD-10-CM | POA: Diagnosis not present

## 2020-05-04 DIAGNOSIS — R269 Unspecified abnormalities of gait and mobility: Secondary | ICD-10-CM

## 2020-05-04 NOTE — Therapy (Signed)
[place] MAIN [facility] SERVICES [address] [place], [place], [address] Phone: [phone]   Fax:  [phone]  Physical Therapy Treatment  Patient Details  Name: Louis Sanchez MRN: [id number] Date of Birth: [birth year] Referring Provider (PT): Tama High, III   Encounter Date: 05/04/2020   PT End of Session - 05/04/20 1611    Visit Number 5    Number of Visits 16    Date for PT Re-Evaluation 05/27/20    Authorization Type 5/10 eval 9/8    PT Start Time 1520    PT Stop Time 1600    PT Time Calculation (min) 40 min    Equipment Utilized During Treatment Gait belt    Activity Tolerance Patient tolerated treatment well    Behavior During Therapy WFL for tasks assessed/performed           Past Medical History:  Diagnosis Date  . Arthritis   . Benign neoplasm of colon 2004  . Benign neoplasm of colon 04/04/2008   77mm tubular adenoma of the ascending colon  . Cancer ([place])    liver and lungs  . Heart disease   . Other and unspecified hyperlipidemia 1997  . Other specified disorder of male genital organs(608.89) 214  . Sleep disturbance, unspecified   . Thyroid disease   . Unspecified hypothyroidism 2005    Past Surgical History:  Procedure Laterality Date  . APPENDECTOMY  1953  . colon polyps  2009  . HERNIA REPAIR  1957   Right  . INGUINAL HERNIA REPAIR Right 2014  . SPINE SURGERY  1997   neck  . WRIST SURGERY      There were no vitals filed for this visit.   Subjective Assessment - 05/04/20 1523    Subjective Patient notes increased compliance with HEP with no pain. Denies falls or LOB since last session.    Pertinent History Patient presents to physical therapy without AD. Reports feet don't go where he wants them to go. Patient reports his balance started to decline about 15 minutes ago. If he looks up he goes "everywhere" Has multiple near falls in the shower. 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  cervical spine MRI from 10/14/2019 , which demonstrates C6/C7 fusion, central stenosis C5/C6 and C4/C5, multiple facet arthritis C3/C4 to C5/C6.    Limitations Walking;Standing;Other (comment)    How long can you sit comfortably? n/a    How long can you stand comfortably? patient reports feeling unsteady initially when standing    How long can you walk comfortably? 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 No/denies          Treatment:  Standing at [employer]: [place] with green theraband tied across as visual target in order to promote forward lean. 12x each leg  Side stepping on foam balance beam 6x. PT cues for slight knee flexion to challenge balance  during task.  On Airex Pad: static balance with rainbow ball toss off wall. 2 rounds of 1 minute. CGA for safety.  Lateral lunges on Bosu ball (flat side down) 12x each leg. SUE support for initial stability, then without UE support  Forward lunges on Bosu ball (flat side down) 12x each leg. SUE support for initial stability, then without UE support.  Seated: Forward leans in chair with blue medicine ball in order to promote forward trunk lean. 15x  Leg raises with green small ball between feet. Patient sits in chair without back support during  task. PT cues for sustained core activation during task. Performed 15x  Pt educated throughout session about proper posture and technique with exercises. Improved exercise technique, movement at target joints, use of target muscles after min to mod verbal, visual, tactile cues.                           PT Education - 05/04/20 1549    Education Details body mechanics, exercise technique, breathing, pacing    Person(s) Educated Patient    Methods Explanation;Tactile cues;Demonstration;Verbal cues    Comprehension Verbalized understanding;Tactile cues required;Returned demonstration;Verbal cues required            PT Short Term Goals - 04/01/20 1737      PT SHORT TERM GOAL #1   Title Patient will be independent in home exercise program to improve strength/mobility for better functional independence with ADLs.    Baseline 9/8 : HEP given    Time 4    Period Weeks    Status New    Target Date 04/29/20             PT Long Term Goals - 04/29/20 1710      PT LONG TERM GOAL #1   Title Patient will increase FOTO score to equal to or greater than 77%  to demonstrate statistically significant improvement in mobility and quality of life.    Baseline 9/8: 74%    Time 8    Period Weeks    Status New    Target Date 05/27/20      PT LONG TERM GOAL #2   Title Patient will demonstrate an improved Berg Balance Score of > 46/56 as to demonstrate improved balance with ADLs such as sitting/standing and transfer balance and reduced fall risk.    Baseline 9/8: 40/56 10/6: 46/56    Time 8    Period Weeks    Status New    Target Date 05/27/20      PT LONG TERM GOAL #3   Title Patient will increase dynamic gait index score to >19/24 as to demonstrate reduced fall risk and improved dynamic gait balance for better safety with community/home ambulation.    Baseline 9/8: 14/24 10/6: 19/24    Time 8    Period Weeks    Status New      PT LONG TERM GOAL #4   Title Patient  will increase ABC scale score >80% to demonstrate better functional mobility and better confidence with ADLs.    Baseline 9/8: 11.9% 10/6: 84.4%    Time 8    Period Weeks    Status New    Target Date 05/27/20                 Plan - 05/04/20 1612    Clinical Impression Statement Patient has no instances of LOB throughout session despite progressions in dynamic balance interventions.  Patient demonstrates decreased core strength, suspect potential cause for patient's trunk extension presentation. Patient displays improved ability to find COM during bouts of instability. Patient will benefit from skilled physical therapy to increase balance, posture, and mobility for decreased fall risk and improved quality of life.    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    Examination-Activity Limitations Bathing;Carry;Locomotion Level;Reach Overhead;Squat;Stairs;Transfers    Stability/Clinical Decision Making Evolving/Moderate complexity    Rehab Potential Fair    PT Frequency 2x / week    PT Duration 8 weeks    PT Treatment/Interventions ADLs/Self Care Home Management;Therapeutic activities;Therapeutic exercise;Balance training;Neuromuscular re-education;Cognitive remediation;Manual techniques;Dry needling;Passive range of motion;Aquatic Therapy;Biofeedback;Canalith Repostioning;Cryotherapy;Moist Heat;Traction;DME Instruction;Gait  training;Stair training;Functional mobility training;Patient/family education;Manual lymph drainage;Compression bandaging;Energy conservation;Splinting;Taping;Vestibular;Visual/perceptual remediation/compensation    PT Next Visit Plan neutral trunk management, high level balance    PT Home Exercise Plan see above    Consulted and Agree with Plan of Care Patient;Family member/caregiver    Family Member Consulted wife, Louis Sanchez           Patient will benefit from skilled therapeutic intervention in order to improve the following deficits and impairments:  Postural dysfunction, Increased muscle spasms, Decreased range of motion, Decreased balance, Difficulty walking, Impaired flexibility, Abnormal gait, Cardiopulmonary status limiting activity, Decreased activity tolerance, Decreased endurance, Decreased coordination, Decreased strength, Impaired perceived functional ability, Improper body mechanics, Impaired vision/preception  Visit Diagnosis: Unsteadiness on feet  Abnormality of gait and mobility     Problem List Patient Active Problem List   Diagnosis Date Noted  . Mitral regurgitation 09/20/2016  . TIA (transient ischemic attack) 09/20/2016  . Liver masses 09/13/2016  . Acute bronchitis 09/13/2016  . Leukocytosis 09/13/2016  . Venous stasis dermatitis 09/13/2016  . LFT elevation   . Alcoholic gastritis   . Atrial fibrillation with RVR ([place]) 09/11/2016  . Acute lower GI bleeding 05/23/2016  . 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 Hematochezia 05/23/2016  . History of hypothyroidism 05/23/2016  . Chronic atrial fibrillation ([place]) 05/03/2016  . Ruptured varicose vein 01/25/2016  . Varicose veins of both lower extremities with complications 16/04/9603  . 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 Temporary cerebral vascular dysfunction 08/25/2014  . Inguinal hernia 02/18/2013  . Actinic keratosis 01/21/2010  . 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 Benign neoplasm of colon 04/04/2008   Louis Sanchez, SPT  This entire session was performed under direct supervision and direction of a licensed therapist/therapist assistant . I have personally read, edited and approve of the note as written.  Louis Sanchez, PT, DPT   05/04/2020, 4:54 PM  [place] MAIN [facility] SERVICES [address] [place], [place], [address] Phone: [phone]   Fax:  [phone]  Name: Louis Sanchez MRN: [id number] Date of Birth: 08-09-1940

## 2020-05-06 ENCOUNTER — Ambulatory Visit: Payer: Medicare Other

## 2020-05-06 ENCOUNTER — Other Ambulatory Visit: Payer: Self-pay

## 2020-05-06 DIAGNOSIS — R269 Unspecified abnormalities of gait and mobility: Secondary | ICD-10-CM

## 2020-05-06 DIAGNOSIS — R293 Abnormal posture: Secondary | ICD-10-CM

## 2020-05-06 DIAGNOSIS — M542 Cervicalgia: Secondary | ICD-10-CM

## 2020-05-06 DIAGNOSIS — R2681 Unsteadiness on feet: Secondary | ICD-10-CM

## 2020-05-06 NOTE — Therapy (Signed)
[place] MAIN [facility] SERVICES [address] [place], [place], [address] Phone: [phone]   Fax:  [phone]  Physical Therapy Treatment  Patient Details  Name: Louis Sanchez MRN: [id number] Date of Birth: 1941/03/02 Referring Provider (PT): Tama High, III   Encounter Date: 05/06/2020   PT End of Session - 05/06/20 1604    Visit Number 6    Number of Visits 16    Date for PT Re-Evaluation 05/27/20    Authorization Type 6/10 eval 9/8    PT Start Time 1600    PT Stop Time 1644    PT Time Calculation (min) 44 min    Equipment Utilized During Treatment Gait belt    Activity Tolerance Patient tolerated treatment well    Behavior During Therapy WFL for tasks assessed/performed           Past Medical History:  Diagnosis Date  . Arthritis   . Benign neoplasm of colon 2004  . Benign neoplasm of colon 04/04/2008   87mm tubular adenoma of the ascending colon  . Cancer ([place])    liver and lungs  . Heart disease   . Other and unspecified hyperlipidemia 1997  . Other specified disorder of male genital organs(608.89) 214  . Sleep disturbance, unspecified   . Thyroid disease   . Unspecified hypothyroidism 2005    Past Surgical History:  Procedure Laterality Date  . APPENDECTOMY  1953  . colon polyps  2009  . HERNIA REPAIR  1957   Right  . INGUINAL HERNIA REPAIR Right 2014  . SPINE SURGERY  1997   neck  . WRIST SURGERY      There were no vitals filed for this visit.   Subjective Assessment - 05/06/20 1602    Subjective Patient notes practicing single leg stands at home with improved balance. PT advises to stay in front of stable surface when performing single leg balance at home. Denies falls or LOB since last session.    Pertinent History Patient presents to physical therapy without AD. Reports feet don't go where he wants them to go. Patient reports his balance started to decline about 15 minutes ago. If he looks up he goes  "everywhere" Has multiple near falls in the shower. 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  cervical spine MRI from 10/14/2019 , which demonstrates C6/C7 fusion, central stenosis C5/C6 and C4/C5, multiple facet arthritis C3/C4 to C5/C6.    Limitations Walking;Standing;Other (comment)    How long can you sit comfortably? n/a    How long can you stand comfortably? patient reports feeling unsteady initially when standing    How long can you walk comfortably? 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 No/denies            Treatment: Octane level 4, 5 minutes to promote BUE and BLE coordination. RPM >60 for cardiovascular training.  Standing at  Bar: Marches with 3# ankle weights. Green theraband tied across as visual target in order to promote forward lean. 12x each leg  Forward/backward tandem walking on foam balance beam 6x. PT cues for slight knee flexion to challenge balance during task.  On Airex Pad: balance on soccer ball with SUE support on bar. 30 seconds x 2 bouts on each leg  Lateral lunges on Bosu ball (flat side down) 10x each leg. SUE support for inc stability   Seated: Forward leans in chair with blue medicine ball in order to promote forward trunk lean. PT cues  for slow execution of task to maximize muscle activation 15x  Sit to stand with blue med ball overhead press. CGA for safety. Performed 10x  Seated TrA activation with green physioball, 10x with 3 second holds. PT educates on exhaling during exertion, as patient would hold breath.  Seated TrA activation with green physioball, alternating arms. PT cues for sustained core activation throughout task. 8 arm raises each arm.   Pt educated throughout session about proper posture and technique with exercises. Improved exercise technique, movement at target joints, use of target muscles after min to mod verbal, visual, tactile cues.   Patient holds breath during multiple exercises. PT educates on exhalation during exertion of task in order to promote proper respiration and decrease Valsalva.                       PT Education - 05/06/20 1603    Education Details breathing, exercise technique, body mechanics    Person(s) Educated Patient    Methods Explanation;Demonstration;Tactile cues;Verbal cues    Comprehension Tactile cues required;Verbalized understanding;Returned demonstration;Verbal cues required            PT Short Term Goals - 04/01/20 1737      PT SHORT TERM GOAL #1   Title Patient will be independent in home exercise program to improve strength/mobility for better functional independence with ADLs.    Baseline 9/8 : HEP given    Time 4    Period Weeks    Status New    Target Date 04/29/20             PT Long Term Goals - 04/29/20 1710      PT LONG TERM GOAL #1   Title Patient will increase FOTO score to equal to or greater than 77%  to demonstrate statistically significant improvement in mobility and quality of life.    Baseline 9/8: 74%    Time 8    Period Weeks    Status New    Target Date 05/27/20      PT LONG TERM GOAL #2   Title Patient will demonstrate an improved Berg Balance Score of > 46/56 as to demonstrate improved balance with ADLs  such as sitting/standing and transfer balance and reduced fall risk.    Baseline 9/8: 40/56 10/6: 46/56    Time 8    Period Weeks    Status New    Target Date 05/27/20      PT LONG TERM GOAL #3   Title Patient will increase dynamic gait index score to >19/24 as to demonstrate reduced fall risk and improved dynamic gait balance for better safety with community/home ambulation.    Baseline 9/8: 14/24 10/6: 19/24    Time 8    Period Weeks    Status New      PT LONG TERM GOAL #4   Title Patient will increase ABC scale  score >80% to demonstrate better functional mobility and better confidence with ADLs.    Baseline 9/8: 11.9% 10/6: 84.4%    Time 8    Period Weeks    Status New    Target Date 05/27/20                 Plan - 05/06/20 1655    Clinical Impression Statement Patient demonstrates good motivation this session. Patient shows improvement with finding COM. Patient demonstrates SOB d/t holding breath during exercise. PT educates on importance of exhaling during exertion of task to ensure proper respiration and to avoid valsalva. Dynamic balance improved this session with no instances of LOB. Patient will benefit from skilled physical therapy to increase balance, posture, and mobility for decreased fall risk and improved quality of life.    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    Examination-Activity  Limitations Bathing;Carry;Locomotion Level;Reach Overhead;Squat;Stairs;Transfers    Stability/Clinical Decision Making Evolving/Moderate complexity    Rehab Potential Fair    PT Frequency 2x / week    PT Duration 8 weeks    PT Treatment/Interventions ADLs/Self Care Home Management;Therapeutic activities;Therapeutic exercise;Balance training;Neuromuscular re-education;Cognitive remediation;Manual techniques;Dry needling;Passive range of motion;Aquatic Therapy;Biofeedback;Canalith Repostioning;Cryotherapy;Moist Heat;Traction;DME Instruction;Gait training;Stair training;Functional mobility training;Patient/family education;Manual lymph drainage;Compression bandaging;Energy conservation;Splinting;Taping;Vestibular;Visual/perceptual remediation/compensation    PT Next Visit Plan neutral trunk management, high level balance    PT Home Exercise Plan see above    Consulted and Agree with Plan of Care Patient;Family member/caregiver    Family Member Consulted wife, Joycelyn Schmid           Patient will benefit from skilled therapeutic intervention in order to improve the following deficits and impairments:  Postural dysfunction, Increased muscle spasms, Decreased range of motion, Decreased balance, Difficulty walking, Impaired flexibility, Abnormal gait, Cardiopulmonary status limiting activity, Decreased activity tolerance, Decreased endurance, Decreased coordination, Decreased strength, Impaired perceived functional ability, Improper body mechanics, Impaired vision/preception  Visit Diagnosis: Unsteadiness on feet  Abnormality of gait and mobility  Cervicalgia  Abnormal posture     Problem List Patient Active Problem List   Diagnosis Date Noted  . Mitral regurgitation 09/20/2016  . TIA (transient ischemic attack) 09/20/2016  . Liver masses 09/13/2016  . Acute bronchitis 09/13/2016  . Leukocytosis 09/13/2016  . Venous stasis dermatitis 09/13/2016  . LFT elevation   . Alcoholic gastritis   .  Atrial fibrillation with RVR ([place]) 09/11/2016  . Acute lower GI bleeding 05/23/2016  . 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 Hematochezia 05/23/2016  . History of hypothyroidism 05/23/2016  . Chronic atrial fibrillation ([place]) 05/03/2016  . Ruptured varicose vein 01/25/2016  . Varicose veins of both lower extremities with complications 29/52/8413  . 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 Temporary cerebral vascular dysfunction 08/25/2014  . Inguinal hernia 02/18/2013  . Actinic keratosis 01/21/2010  . 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 Benign neoplasm of colon 04/04/2008   Tonny Bollman, SPT This entire session was performed under direct supervision and direction of a licensed therapist/therapist assistant . I have personally read, edited and approve of the note as written.  Janna Arch, PT,  DPT   05/06/2020, 5:01 PM  [place] MAIN [facility] SERVICES [address] [place], [place], [address] Phone: [phone]   Fax:  [phone]  Name: Louis Sanchez MRN: [id number] Date of Birth: 05/04/1941

## 2020-05-11 ENCOUNTER — Ambulatory Visit: Payer: Medicare Other

## 2020-05-11 ENCOUNTER — Other Ambulatory Visit: Payer: Self-pay

## 2020-05-11 DIAGNOSIS — R2681 Unsteadiness on feet: Secondary | ICD-10-CM

## 2020-05-11 DIAGNOSIS — M542 Cervicalgia: Secondary | ICD-10-CM

## 2020-05-11 DIAGNOSIS — R269 Unspecified abnormalities of gait and mobility: Secondary | ICD-10-CM

## 2020-05-11 DIAGNOSIS — R293 Abnormal posture: Secondary | ICD-10-CM

## 2020-05-11 NOTE — Therapy (Signed)
[place] MAIN [facility] SERVICES [address] [place], [place], [address] Phone: [phone]   Fax:  [phone]  Physical Therapy Treatment  Patient Details  Name: Louis Sanchez MRN: [id number] Date of Birth: [birth year] Referring Provider (PT): Tama High, III   Encounter Date: 05/11/2020   PT End of Session - 05/11/20 1656    Visit Number 7    Number of Visits 16    Date for PT Re-Evaluation 05/27/20    Authorization Type 7/10 eval 9/8    PT Start Time 1600    PT Stop Time 1644    PT Time Calculation (min) 44 min    Equipment Utilized During Treatment Gait belt    Activity Tolerance Patient tolerated treatment well    Behavior During Therapy WFL for tasks assessed/performed           Past Medical History:  Diagnosis Date  . Arthritis   . Benign neoplasm of colon 2004  . Benign neoplasm of colon 04/04/2008   36mm tubular adenoma of the ascending colon  . Cancer ([place])    liver and lungs  . Heart disease   . Other and unspecified hyperlipidemia 1997  . Other specified disorder of male genital organs(608.89) 214  . Sleep disturbance, unspecified   . Thyroid disease   . Unspecified hypothyroidism 2005    Past Surgical History:  Procedure Laterality Date  . APPENDECTOMY  1953  . colon polyps  2009  . HERNIA REPAIR  1957   Right  . INGUINAL HERNIA REPAIR Right 2014  . SPINE SURGERY  1997   neck  . WRIST SURGERY      There were no vitals filed for this visit.   Subjective Assessment - 05/11/20 1602    Subjective Patient notes feeling more short of breath today, states that it has been getting worse in the past week. Patient states he has not been doing HEP due to shortness of breath. Denies falls or LOB since last session.    Pertinent History Patient presents to physical therapy without AD. Reports feet don't go where he wants them to go. Patient reports his balance started to decline about 15 minutes ago. If he looks up  he goes "everywhere" Has multiple near falls in the shower. 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  cervical spine MRI from 10/14/2019 , which demonstrates C6/C7 fusion, central stenosis C5/C6 and C4/C5, multiple facet arthritis C3/C4 to C5/C6.    Limitations Walking;Standing;Other (comment)    How long can you sit comfortably? n/a    How long can you stand comfortably? patient reports feeling unsteady initially when standing    How long can you walk comfortably? 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 No/denies          Vitals at [facility]: 149/79, 60bpm spO2 100%  Treatment: Agility ladder: forwardone foot ineach square4x, side stepping  into/out of squares4x. SpO2 90% after task with inc rest time required to reach safe O2 levels.  Patient reaches down for basketball and shoots to challenge balance with perturbations. 19 x 2 sets. PT educates on proper breathing technique when performing task to avoid SOB. Patient requires max VCs for exhalation upon exertion. SpO2 checked during task, 100% with 90bpm  Standing in // Bars: Side stepping with balloon taps against mirror. CGA for safety. Performed 6x length of bars. Patient requires standing rest break after 5th lap to regain  breath.  SpO2 ranged from 90%-100% throughout session. PT monitors vitals throughout session to ensure safe therapeutic ranges   Pt educated throughout session about proper posture and technique with exercises. Improved exercise technique, movement at target joints, use of target muscles after min to mod verbal, visual, tactile cues.      PT Education - 05/11/20 1627    Education Details exercise technique, body mechanics, breathing    Person(s) Educated Patient    Methods Explanation;Demonstration;Tactile cues;Verbal cues    Comprehension Tactile cues required;Verbalized understanding;Returned demonstration;Verbal cues required            PT Short Term Goals - 04/01/20 1737      PT SHORT TERM GOAL #1   Title Patient will be independent in home exercise program to improve strength/mobility for better functional independence with ADLs.    Baseline 9/8 : HEP given    Time 4    Period Weeks    Status New    Target Date 04/29/20             PT Long Term Goals - 04/29/20 1710      PT LONG TERM GOAL #1   Title Patient will increase FOTO score to equal to or greater than 77%  to demonstrate statistically significant improvement in mobility and quality of life.    Baseline 9/8: 74%    Time 8    Period Weeks    Status New    Target Date 05/27/20      PT LONG TERM GOAL #2   Title Patient will demonstrate an improved Berg Balance Score of > 46/56 as to demonstrate improved balance with ADLs such as sitting/standing and transfer balance and reduced fall risk.    Baseline 9/8: 40/56 10/6: 46/56    Time 8    Period Weeks    Status New    Target Date 05/27/20      PT LONG TERM GOAL #3   Title Patient will increase dynamic gait index score to >19/24 as to demonstrate reduced fall risk and improved dynamic gait balance for better safety with community/home ambulation.    Baseline 9/8: 14/24 10/6: 19/24    Time 8    Period Weeks    Status New      PT LONG TERM GOAL #4    Title Patient will increase ABC scale score >80% to demonstrate better functional mobility and better confidence with ADLs.    Baseline 9/8: 11.9% 10/6: 84.4%    Time 8    Period Weeks    Status New    Target Date 05/27/20                 Plan - 05/11/20 1704    Clinical Impression Statement Pt arrives this date complaining of still worsening SOB, new phenomenon as of last week. Vitals monitored in session, noted periods of hypoxia down to 90% on room air after interventions that trigger DOE. SpO2 does  recover fully with seated resting 1-2 minutes. BP WNL, HR bradycardiac. Session is limited due to patient's shortness of breath. Patient continues to hold breath during interventions, requiring max verbal cues for proper respiration. Patient does not display overt LOB throughout session. PT plans to call PCP before next session to discuss acute onset of SOB and hypoxia. Patient will benefit from skilled physical therapy to increase balance, posture, and mobility for decreased fall risk and improved quality of life.    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    Examination-Activity Limitations Bathing;Carry;Locomotion Level;Reach Overhead;Squat;Stairs;Transfers    Stability/Clinical Decision Making Evolving/Moderate complexity    Rehab Potential Fair    PT Frequency 2x / week    PT Duration 8 weeks    PT  Treatment/Interventions ADLs/Self Care Home Management;Therapeutic activities;Therapeutic exercise;Balance training;Neuromuscular re-education;Cognitive remediation;Manual techniques;Dry needling;Passive range of motion;Aquatic Therapy;Biofeedback;Canalith Repostioning;Cryotherapy;Moist Heat;Traction;DME Instruction;Gait training;Stair training;Functional mobility training;Patient/family education;Manual lymph drainage;Compression bandaging;Energy conservation;Splinting;Taping;Vestibular;Visual/perceptual remediation/compensation    PT Next Visit Plan neutral trunk management, high level balance    PT Home Exercise Plan see above    Consulted and Agree with Plan of Care Patient;Family member/caregiver    Family Member Consulted wife, Joycelyn Schmid           Patient will benefit from skilled therapeutic intervention in order to improve the following deficits and impairments:  Postural dysfunction, Increased muscle spasms, Decreased range of motion, Decreased balance, Difficulty walking, Impaired flexibility, Abnormal gait, Cardiopulmonary status limiting activity, Decreased activity tolerance, Decreased endurance, Decreased coordination, Decreased strength, Impaired perceived functional ability, Improper body mechanics, Impaired vision/preception  Visit Diagnosis: Unsteadiness on feet  Abnormality of gait and mobility  Cervicalgia  Abnormal posture     Problem List Patient Active Problem List   Diagnosis Date Noted  . Mitral regurgitation 09/20/2016  . TIA (transient ischemic attack) 09/20/2016  . Liver masses 09/13/2016  . Acute bronchitis 09/13/2016  . Leukocytosis 09/13/2016  . Venous stasis dermatitis 09/13/2016  . LFT elevation   . Alcoholic gastritis   . Atrial fibrillation with RVR ([place]) 09/11/2016  . Acute lower GI bleeding 05/23/2016  . 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 Hematochezia 05/23/2016  . History of hypothyroidism 05/23/2016  . Chronic atrial fibrillation ([place]) 05/03/2016   . Ruptured varicose vein 01/25/2016  . Varicose veins of both lower extremities with complications 23/55/7322  . 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 Temporary cerebral vascular dysfunction 08/25/2014  . Inguinal hernia 02/18/2013  . Actinic keratosis 01/21/2010  . 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 Benign neoplasm of colon 04/04/2008   This entire session was performed under the direct supervision of a liscensed physical therapist. I have reviewed and agree with student's findings and recommendations.   5:16 PM, 05/11/20 Etta Grandchild, PT, DPT Physical Therapist - [facility]  Outpatient Physical Therapy- [place] [phone]     Tonny Bollman, SPT  05/11/2020, 5:05 PM  [place] MAIN [facility] SERVICES [address] [place], [place], [address] Phone: [phone]   Fax:  [phone]  Name: Louis Sanchez MRN: [id number]  Date of Birth: January 25, 1941

## 2020-05-13 ENCOUNTER — Ambulatory Visit: Payer: Medicare Other

## 2020-05-18 ENCOUNTER — Ambulatory Visit: Payer: Medicare Other

## 2020-05-20 ENCOUNTER — Ambulatory Visit: Payer: Medicare Other

## 2020-05-26 ENCOUNTER — Ambulatory Visit: Payer: Medicare Other

## 2020-05-28 ENCOUNTER — Ambulatory Visit: Payer: Medicare Other

## 2020-06-04 ENCOUNTER — Ambulatory Visit: Payer: Medicare Other

## 2020-06-09 ENCOUNTER — Ambulatory Visit: Payer: Medicare Other

## 2020-06-11 ENCOUNTER — Ambulatory Visit: Payer: Medicare Other

## 2020-06-16 ENCOUNTER — Ambulatory Visit: Payer: Medicare Other

## 2020-06-23 ENCOUNTER — Ambulatory Visit: Payer: Medicare Other

## 2020-06-25 ENCOUNTER — Ambulatory Visit: Payer: Medicare Other

## 2020-10-07 DIAGNOSIS — D6869 Other thrombophilia: Secondary | ICD-10-CM | POA: Insufficient documentation

## 2020-10-07 DIAGNOSIS — I5022 Chronic systolic (congestive) heart failure: Secondary | ICD-10-CM | POA: Insufficient documentation

## 2020-10-27 DIAGNOSIS — I519 Heart disease, unspecified: Secondary | ICD-10-CM | POA: Insufficient documentation

## 2020-12-14 ENCOUNTER — Telehealth (INDEPENDENT_AMBULATORY_CARE_PROVIDER_SITE_OTHER): Payer: Self-pay

## 2020-12-14 ENCOUNTER — Other Ambulatory Visit (INDEPENDENT_AMBULATORY_CARE_PROVIDER_SITE_OTHER): Payer: Self-pay | Admitting: Nurse Practitioner

## 2020-12-14 DIAGNOSIS — I83813 Varicose veins of bilateral lower extremities with pain: Secondary | ICD-10-CM

## 2020-12-14 NOTE — Telephone Encounter (Signed)
Done

## 2020-12-15 ENCOUNTER — Encounter (INDEPENDENT_AMBULATORY_CARE_PROVIDER_SITE_OTHER): Payer: Self-pay | Admitting: Nurse Practitioner

## 2020-12-15 ENCOUNTER — Ambulatory Visit (INDEPENDENT_AMBULATORY_CARE_PROVIDER_SITE_OTHER): Payer: Medicare Other | Admitting: Nurse Practitioner

## 2020-12-15 ENCOUNTER — Other Ambulatory Visit: Payer: Self-pay

## 2020-12-15 ENCOUNTER — Ambulatory Visit (INDEPENDENT_AMBULATORY_CARE_PROVIDER_SITE_OTHER): Payer: Medicare Other

## 2020-12-15 VITALS — BP 124/71 | HR 80 | Resp 16 | Wt 144.2 lb

## 2020-12-15 DIAGNOSIS — I83813 Varicose veins of bilateral lower extremities with pain: Secondary | ICD-10-CM | POA: Diagnosis not present

## 2020-12-15 NOTE — Progress Notes (Signed)
Subjective:    Patient ID: Louis Sanchez, male    DOB: Aug 25, 1940, 80 y.o.   MRN: [id number] Chief Complaint  Patient presents with  . New Patient (Initial Visit)    Ref Fields varicose veins w/pain venous insufficiency    Louis Sanchez is a 80 year old male that presents today for evaluation of varicose veins by Holley Raring fields, NP.  The patient notes he has had burning and stinging in his feet for nearly a year or so prior to the ulcerations that occurred on his feet.  The patient notes that his ulcerations have been present for about 4 to 5 months.  He denies wearing any compression socks.  He denies elevating or regular basis.  He tried using Neosporin and other creams but nothing is seem to help.  He denies any fever or chills.  He denies any extensive bleeding from the wounds.  The patient has had a previous history of a vein stripping in his right lower extremity.  There are not too small venous ulcerations bilaterally on his feet.  Today noninvasive studies show venous insufficiency in the right lower extremity.  There is reflux in the great saphenous vein at the mid thigh on the left lower extremity.  No DVT or superficial thrombophlebitis seen bilaterally.  The patient also had a limited arterial duplex done and the bilateral popliteal arteries have triphasic flow with biphasic flow demonstrated in the bilateral posterior tibial arteries.   Review of Systems  Skin: Positive for wound.  All other systems reviewed and are negative.      Objective:   Physical Exam Vitals reviewed.  HENT:     Head: Normocephalic.  Cardiovascular:     Rate and Rhythm: Normal rate.     Pulses: Decreased pulses.  Pulmonary:     Effort: Pulmonary effort is normal.  Musculoskeletal:     Right lower leg: 1+ Edema present.     Left lower leg: 1+ Edema present.  Skin:    General: Skin is warm and dry.  Neurological:     Mental Status: He is alert and oriented to person, place, and time.  Psychiatric:         Mood and Affect: Mood normal.        Behavior: Behavior normal.        Thought Content: Thought content normal.        Judgment: Judgment normal.     BP 124/71 (BP Location: Right Arm)   Pulse 80   Resp 16   Wt 144 lb 3.2 oz (65.4 kg)   BMI 21.93 kg/m   Past Medical History:  Diagnosis Date  . Arthritis   . Benign neoplasm of colon 2004  . Benign neoplasm of colon 04/04/2008   67mm tubular adenoma of the ascending colon  . Cancer ([place])    liver and lungs  . Heart disease   . Other and unspecified hyperlipidemia 1997  . Other specified disorder of male genital organs(608.89) 214  . Sleep disturbance, unspecified   . Thyroid disease   . Unspecified hypothyroidism 2005    Social History   Socioeconomic History  . Marital status: Married    Spouse name: Not on file  . Number of children: Not on file  . Years of education: Not on file  . Highest education level: Not on file  Occupational History  . Not on file  Tobacco Use  . Smoking status: Former Smoker    Years: 25.00    Types:  Cigarettes  . Smokeless tobacco: Never Used  Substance and Sexual Activity  . Alcohol use: Yes    Alcohol/week: 15.0 standard drinks    Types: 15 Standard drinks or equivalent per week  . Drug use: No  . Sexual activity: Not on file  Other Topics Concern  . Not on file  Social History Narrative  . Not on file   Social Determinants of Health   Financial Resource Strain: Not on file  Food Insecurity: Not on file  Transportation Needs: Not on file  Physical Activity: Not on file  Stress: Not on file  Social Connections: Not on file  Intimate Partner Violence: Not on file    Past Surgical History:  Procedure Laterality Date  . APPENDECTOMY  1953  . colon polyps  2009  . HERNIA REPAIR  1957   Right  . INGUINAL HERNIA REPAIR Right 2014  . SPINE SURGERY  1997   neck  . WRIST SURGERY      Family History  Problem Relation Age of Onset  . Alzheimer's disease Mother      Allergies  Allergen Reactions  . Atorvastatin     Other reaction(s): Muscle Pain Other reaction(s): Muscle Pain Other reaction(s): Muscle Pain Other reaction(s): Muscle Pain Other reaction(s): Muscle Pain   . Levofloxacin Rash    CBC Latest Ref Rng & Units 09/12/2016 09/11/2016 01/30/2014  WBC 3.8 - 10.6 K/uL 10.0 12.3(H) 7.4  Hemoglobin 13.0 - 18.0 g/dL 13.3 13.9 14.6  Hematocrit 40.0 - 52.0 % 39.8(L) 42.0 43.9  Platelets 150 - 440 K/uL 135(L) 156 140(L)      CMP     Component Value Date/Time   NA 137 09/12/2016 0416   NA 139 01/30/2014 1705   K 3.8 09/12/2016 0416   K 4.4 01/30/2014 1705   CL 104 09/12/2016 0416   CL 102 01/30/2014 1705   CO2 26 09/12/2016 0416   CO2 29 01/30/2014 1705   GLUCOSE 156 (H) 09/12/2016 0416   GLUCOSE 109 (H) 01/30/2014 1705   BUN 15 09/12/2016 0416   BUN 21 (H) 01/30/2014 1705   CREATININE 0.76 09/12/2016 0416   CREATININE 1.03 01/30/2014 1705   CALCIUM 8.3 (L) 09/12/2016 0416   CALCIUM 9.2 01/30/2014 1705   PROT 6.2 (L) 09/13/2016 0355   PROT 7.2 01/30/2014 1705   ALBUMIN 2.9 (L) 09/13/2016 0355   ALBUMIN 3.8 01/30/2014 1705   AST 46 (H) 09/13/2016 0355   AST 58 (H) 01/30/2014 1705   ALT 102 (H) 09/13/2016 0355   ALT 74 01/30/2014 1705   ALKPHOS 324 (H) 09/13/2016 0355   ALKPHOS 75 01/30/2014 1705   BILITOT 0.9 09/13/2016 0355   BILITOT 0.6 01/30/2014 1705   GFRNONAA >60 09/12/2016 0416   GFRNONAA >60 01/30/2014 1705   GFRAA >60 09/12/2016 0416   GFRAA >60 01/30/2014 1705     No results found.     Assessment & Plan:   1. Varicose veins of bilateral lower extremities with pain The patient has wounds on his bilateral feet this is not a normal place for venous ulcerations but given his extensive varicosities is certainly a possibility.  I discussed Unna wraps with the patient but this was not something he wished to pursue at this time.  Therefore we will try a wound dressing with compression socks.  Prescription was given  today.  He was notified that he should place these first thing in the morning and specifically not to sleep with these on.  He is  also notified to elevate is much as possible to try to help with any swelling that might be occurring.  We will have the patient do this for approximately 4 weeks.  He returns we will also have a formal ABI done just to ensure there are no arterial issues given that these have been present for about 4 to 5 months.  If this does not heal the wounds we will discuss Unna wraps once again.   Current Outpatient Medications on File Prior to Visit  Medication Sig Dispense Refill  . Ferrous Sulfate (IRON PO) Take by mouth.    . furosemide (LASIX) 40 MG tablet Take 20 mg by mouth daily.    Marland Kitchen losartan (COZAAR) 25 MG tablet Take 12.5 mg by mouth daily.    . metoprolol succinate (TOPROL-XL) 25 MG 24 hr tablet Take 1 tablet by mouth daily.    . Multiple Vitamin (MULTIVITAMIN) tablet Take 1 tablet by mouth daily.    . nitroGLYCERIN (NITROSTAT) 0.4 MG SL tablet Place under the tongue.    . nystatin (MYCOSTATIN) 100000 UNIT/ML suspension Take 5 mLs by mouth 4 (four) times daily.    . NYSTATIN powder Apply topically 2 (two) times daily.    . potassium chloride (KLOR-CON) 10 MEQ tablet Take by mouth.    . pravastatin (PRAVACHOL) 20 MG tablet Take 20 mg by mouth.    . rivaroxaban (XARELTO) 20 MG TABS tablet Take 20 mg by mouth daily with supper.    . rosuvastatin (CRESTOR) 10 MG tablet Take 10 mg by mouth daily.    . sertraline (ZOLOFT) 100 MG tablet Take 1 tablet by mouth daily.    Marland Kitchen SYNTHROID 50 MCG tablet Take 200 mcg by mouth daily.     . valACYclovir (VALTREX) 1000 MG tablet Take 1,000 mg by mouth.    Marland Kitchen aspirin EC 81 MG tablet Take 81 mg by mouth. (Patient not taking: Reported on 12/15/2020)    . atenolol (TENORMIN) 25 MG tablet Take 0.5 tablets by mouth every evening. (Patient not taking: Reported on 12/15/2020)    . B Complex Vitamins (VITAMIN B COMPLEX PO) Take by mouth. (Patient  not taking: Reported on 12/15/2020)    . B Complex-C (B-COMPLEX WITH VITAMIN C) tablet Take 1 tablet by mouth daily. (Patient not taking: Reported on 12/15/2020)    . Cholecalciferol (VITAMIN D3) 1000 units CAPS Take by mouth. (Patient not taking: Reported on 12/15/2020)    . fish oil-omega-3 fatty acids 1000 MG capsule Take 2 g by mouth daily. (Patient not taking: Reported on 12/15/2020)    . fluocinonide cream (LIDEX) 0.05 % Apply topically. (Patient not taking: Reported on 12/15/2020)    . glucosamine-chondroitin 500-400 MG tablet Take 1 tablet by mouth 3 (three) times daily. (Patient not taking: Reported on 12/15/2020)    . levofloxacin (LEVAQUIN) 750 MG tablet Take 1 tablet (750 mg total) by mouth daily. (Patient not taking: Reported on 12/15/2020) 4 tablet 0  . ondansetron (ZOFRAN) 4 MG tablet Take 1 tablet (4 mg total) by mouth every 6 (six) hours as needed for nausea. (Patient not taking: No sig reported) 20 tablet 0  . pantoprazole (PROTONIX) 40 MG tablet Take 1 tablet (40 mg total) by mouth daily. (Patient not taking: No sig reported) 30 tablet 5  . spironolactone (ALDACTONE) 50 MG tablet Take 1 tablet (50 mg total) by mouth 2 (two) times daily. (Patient not taking: No sig reported) 60 tablet 6  . sucralfate (CARAFATE) 1 g tablet Take 1  tablet (1 g total) by mouth 4 (four) times daily -  with meals and at bedtime. (Patient not taking: No sig reported) 90 tablet 0  . tretinoin (RETIN-A) 0.025 % cream Frequency:PHARMDIR   Dosage:0.0     Instructions:  Note:Apply to face at least 20 min after washing. Start with M and TH.  Gradually increase frequency. Use Moisturel cream to dilute if needed. Dose: 0.025 % (Patient not taking: Reported on 12/15/2020)    . vitamin C (ASCORBIC ACID) 500 MG tablet Take 500 mg by mouth daily. (Patient not taking: Reported on 12/15/2020)    . vitamin E 100 UNIT capsule Take 400 Units by mouth daily. (Patient not taking: Reported on 12/15/2020)     No current  facility-administered medications on file prior to visit.    There are no Patient Instructions on file for this visit. No follow-ups on file.   Kris Hartmann, NP

## 2020-12-30 ENCOUNTER — Telehealth (INDEPENDENT_AMBULATORY_CARE_PROVIDER_SITE_OTHER): Payer: Self-pay | Admitting: Vascular Surgery

## 2020-12-30 NOTE — Telephone Encounter (Signed)
Sign order has been faxed over to [facility] [phone]

## 2020-12-30 NOTE — Telephone Encounter (Signed)
[facility] called verifying if we received a fax for his compression socks.  They stated they sent fax over 6/1.  Please advise.

## 2021-01-10 ENCOUNTER — Other Ambulatory Visit (INDEPENDENT_AMBULATORY_CARE_PROVIDER_SITE_OTHER): Payer: Self-pay | Admitting: Nurse Practitioner

## 2021-01-10 DIAGNOSIS — S91309A Unspecified open wound, unspecified foot, initial encounter: Secondary | ICD-10-CM

## 2021-01-12 ENCOUNTER — Ambulatory Visit (INDEPENDENT_AMBULATORY_CARE_PROVIDER_SITE_OTHER): Payer: Medicare Other

## 2021-01-12 ENCOUNTER — Ambulatory Visit (INDEPENDENT_AMBULATORY_CARE_PROVIDER_SITE_OTHER): Payer: Medicare Other | Admitting: Vascular Surgery

## 2021-01-12 ENCOUNTER — Other Ambulatory Visit: Payer: Self-pay

## 2021-01-12 VITALS — BP 126/70 | HR 80 | Ht 67.0 in | Wt 135.0 lb

## 2021-01-12 DIAGNOSIS — L97521 Non-pressure chronic ulcer of other part of left foot limited to breakdown of skin: Secondary | ICD-10-CM | POA: Diagnosis not present

## 2021-01-12 DIAGNOSIS — S91309A Unspecified open wound, unspecified foot, initial encounter: Secondary | ICD-10-CM | POA: Diagnosis not present

## 2021-01-12 DIAGNOSIS — I872 Venous insufficiency (chronic) (peripheral): Secondary | ICD-10-CM | POA: Diagnosis not present

## 2021-01-12 DIAGNOSIS — L97511 Non-pressure chronic ulcer of other part of right foot limited to breakdown of skin: Secondary | ICD-10-CM | POA: Diagnosis not present

## 2021-01-12 DIAGNOSIS — I83893 Varicose veins of bilateral lower extremities with other complications: Secondary | ICD-10-CM | POA: Diagnosis not present

## 2021-01-12 DIAGNOSIS — L97529 Non-pressure chronic ulcer of other part of left foot with unspecified severity: Secondary | ICD-10-CM | POA: Insufficient documentation

## 2021-01-12 NOTE — Progress Notes (Signed)
MRN : [id number]  Louis Sanchez is a 80 y.o. (1940-11-16) male who presents with chief complaint of  Chief Complaint  Patient presents with   Follow-up    FU 4 wk  .  History of Present Illness: Patient returns today in follow up of his ulcers on both feet.  Mildly improved but not much. ABIs today are normal at 1.2 bilaterally. Continuing local wound care with Nystatin at this time.  Current Outpatient Medications  Medication Sig Dispense Refill   aspirin EC 81 MG tablet Take 81 mg by mouth.     atenolol (TENORMIN) 25 MG tablet Take 0.5 tablets by mouth every evening.     B Complex Vitamins (VITAMIN B COMPLEX PO) Take by mouth.     B Complex-C (B-COMPLEX WITH VITAMIN C) tablet Take 1 tablet by mouth daily.     Cholecalciferol (VITAMIN D3) 1000 units CAPS Take by mouth.     Ferrous Sulfate (IRON PO) Take by mouth.     fish oil-omega-3 fatty acids 1000 MG capsule Take 2 g by mouth daily.     fluocinonide cream (LIDEX) 0.05 % Apply topically.     furosemide (LASIX) 40 MG tablet Take 20 mg by mouth daily.     glucosamine-chondroitin 500-400 MG tablet Take 1 tablet by mouth 3 (three) times daily.     levofloxacin (LEVAQUIN) 750 MG tablet Take 1 tablet (750 mg total) by mouth daily. 4 tablet 0   levothyroxine (SYNTHROID) 175 MCG tablet Take 175 mcg by mouth daily.     levothyroxine (SYNTHROID) 175 MCG tablet Take by mouth.     losartan (COZAAR) 25 MG tablet Take 12.5 mg by mouth daily.     metoprolol succinate (TOPROL-XL) 25 MG 24 hr tablet Take 1 tablet by mouth daily.     Multiple Vitamin (MULTIVITAMIN) tablet Take 1 tablet by mouth daily.     nitroGLYCERIN (NITROSTAT) 0.4 MG SL tablet Place under the tongue.     nystatin (MYCOSTATIN) 100000 UNIT/ML suspension Take 5 mLs by mouth 4 (four) times daily.     NYSTATIN powder Apply topically 2 (two) times daily.     ondansetron (ZOFRAN) 4 MG tablet Take 1 tablet (4 mg total) by mouth every 6 (six) hours as needed for nausea. 20 tablet 0    pantoprazole (PROTONIX) 40 MG tablet Take 1 tablet (40 mg total) by mouth daily. 30 tablet 5   potassium chloride (KLOR-CON) 10 MEQ tablet Take by mouth.     pravastatin (PRAVACHOL) 20 MG tablet Take 20 mg by mouth.     rivaroxaban (XARELTO) 20 MG TABS tablet Take 20 mg by mouth daily with supper.     rosuvastatin (CRESTOR) 10 MG tablet Take 10 mg by mouth daily.     sertraline (ZOLOFT) 100 MG tablet Take 1 tablet by mouth daily.     spironolactone (ALDACTONE) 50 MG tablet Take 1 tablet (50 mg total) by mouth 2 (two) times daily. 60 tablet 6   sucralfate (CARAFATE) 1 g tablet Take 1 tablet (1 g total) by mouth 4 (four) times daily -  with meals and at bedtime. 90 tablet 0   SYNTHROID 50 MCG tablet Take 200 mcg by mouth daily.      tretinoin (RETIN-A) 0.025 % cream      valACYclovir (VALTREX) 1000 MG tablet Take 1,000 mg by mouth.     vitamin C (ASCORBIC ACID) 500 MG tablet Take 500 mg by mouth daily.     vitamin  E 100 UNIT capsule Take 400 Units by mouth daily.     No current facility-administered medications for this visit.    Past Medical History:  Diagnosis Date   Arthritis    Benign neoplasm of colon 2004   Benign neoplasm of colon 04/04/2008   56mm tubular adenoma of the ascending colon   Cancer ([place])    liver and lungs   Heart disease    Other and unspecified hyperlipidemia 1997   Other specified disorder of male genital organs(608.89) 214   Sleep disturbance, unspecified    Thyroid disease    Unspecified hypothyroidism 2005    Past Surgical History:  Procedure Laterality Date   APPENDECTOMY  1953   colon polyps  2009   [place]   Right   INGUINAL HERNIA REPAIR Right 2014   SPINE SURGERY  1997   neck   WRIST SURGERY       Social History   Tobacco Use   Smoking status: Former    Years: 25.00    Pack years: 0.00    Types: Cigarettes   Smokeless tobacco: Never  Substance Use Topics   Alcohol use: Yes    Alcohol/week: 15.0 standard drinks     Types: 15 Standard drinks or equivalent per week   Drug use: No      Family History  Problem Relation Age of Onset   Alzheimer's disease Mother      Allergies  Allergen Reactions   Atorvastatin     Other reaction(s): Muscle Pain Other reaction(s): Muscle Pain Other reaction(s): Muscle Pain Other reaction(s): Muscle Pain Other reaction(s): Muscle Pain    Levofloxacin Rash     REVIEW OF SYSTEMS (Negative unless checked)  Constitutional: [] Weight loss  [] Fever  [] Chills Cardiac: [] Chest pain   [] Chest pressure   [] Palpitations   [] Shortness of breath when laying flat   [] Shortness of breath at rest   [] Shortness of breath with exertion. Vascular:  [] Pain in legs with walking   [] Pain in legs at rest   [] Pain in legs when laying flat   [] Claudication   [] Pain in feet when walking  [] Pain in feet at rest  [] Pain in feet when laying flat   [x] History of DVT   [] Phlebitis   [x] Swelling in legs   [x] Varicose veins   [x] Non-healing ulcers Pulmonary:   [] Uses home oxygen   [] Productive cough   [] Hemoptysis   [] Wheeze  [] COPD   [] Asthma Neurologic:  [] Dizziness  [] Blackouts   [] Seizures   [] History of stroke   [] History of TIA  [] Aphasia   [] Temporary blindness   [] Dysphagia   [] Weakness or numbness in arms   [] Weakness or numbness in legs Musculoskeletal:  [x] Arthritis   [] Joint swelling   [] Joint pain   [] Low back pain Hematologic:  [] Easy bruising  [] Easy bleeding   [] Hypercoagulable state   [] Anemic   Gastrointestinal:  [] Blood in stool   [] Vomiting blood  [] Gastroesophageal reflux/heartburn   [] Abdominal pain Genitourinary:  [] Chronic kidney disease   [] Difficult urination  [] Frequent urination  [] Burning with urination   [] Hematuria Skin:  [] Rashes   [x] Ulcers   [x] Wounds Psychological:  [] History of anxiety   []  History of major depression.  Physical Examination  BP 126/70   Pulse 80   Ht 5\' 7"  (1.702 m)   Wt 135 lb (61.2 kg)   BMI 21.14 kg/m  Gen:  Thin, elderly,  NAD Head: [place]/AT, No temporalis wasting. Ear/Nose/Throat: Hearing grossly intact, nares w/o erythema or  drainage Eyes: Conjunctiva clear. Sclera non-icteric Neck: Supple.  Trachea midline Pulmonary:  Good air movement, no use of accessory muscles.  Cardiac: RRR, no JVD Vascular: Vessel Right Left  Radial Palpable Palpable                          PT Palpable Palpable  DP Palpable Palpable   Musculoskeletal: M/S 5/5 throughout.  No deformity or atrophy.  Marked stasis dermatitis changes present bilaterally a little worse on the right than the left.  Prominent varicosities are present bilaterally again worse on the right than the left.  1+ right lower extremity edema, trace left lower extremity edema. Neurologic: Sensation grossly intact in extremities.  Symmetrical.  Speech is fluent.  Psychiatric: Judgment intact, Mood & affect appropriate for pt's clinical situation. Dermatologic: Small scabs and chronic wounds on the plantar aspect of both feet near the midfoot      Labs No results found for this or any previous visit (from the past 2160 hour(s)).  Radiology VAS [place] LOWER EXTREMITY VENOUS REFLUX  Result Date: 12/22/2020  Lower Venous Reflux Study Patient Name:  TAVONTE SEYBOLD  Date of Exam:   12/15/2020 Medical Rec #: [id number]      Accession #:    [id number] Date of Birth: [birth year]      Patient Gender: M Patient Age:   58Y Exam Location:  [facility] Procedure:      VAS [place] LOWER EXTREMITY VENOUS REFLUX Referring Phys: [id number] [place] --------------------------------------------------------------------------------  Indications: Pain, and varicosities.  Performing Technologist: Charlane Ferretti RT (R)(VS)  Examination Guidelines: A complete evaluation includes B-mode imaging, spectral Doppler, color Doppler, and power Doppler as needed of all accessible portions of each vessel. Bilateral testing is considered an integral part of a complete examination. Limited  examinations for reoccurring indications may be performed as noted. The reflux portion of the exam is performed with the patient in reverse Trendelenburg. Significant venous reflux is defined as >500 ms in the superficial venous system, and >1 second in the deep venous system.  Venous Reflux Times +--------------+---------+----------+--------+------------+--------------------+ RIGHT         Reflux NoReflux Yes Reflux Diameter cmsComments                                                Time                                   +--------------+---------+----------+--------+------------+--------------------+ CFV           no                                                          +--------------+---------+----------+--------+------------+--------------------+ FV mid                    yes    3110 ms                                  +--------------+---------+----------+--------+------------+--------------------+ Popliteal  yes    2472 ms                                  +--------------+---------+----------+--------+------------+--------------------+ GSV at [facility]                                           prior                                                                     ablation/stripping   +--------------+---------+----------+--------+------------+--------------------+ GSV prox thigh                                       prior                                                                     ablation/stripping   +--------------+---------+----------+--------+------------+--------------------+ GSV mid thigh                                        prior                                                                     ablation/stripping   +--------------+---------+----------+--------+------------+--------------------+ GSV dist thigh                                       prior                                                                      ablation/stripping   +--------------+---------+----------+--------+------------+--------------------+ GSV at knee                                          prior  ablation/stripping   +--------------+---------+----------+--------+------------+--------------------+ SSV Pop Fossa no                                                          +--------------+---------+----------+--------+------------+--------------------+  +--------------+---------+----------+-----------+------------+--------+ LEFT          Reflux NoReflux YesReflux TimeDiameter cmsComments +--------------+---------+----------+-----------+------------+--------+ CFV           no                                                 +--------------+---------+----------+-----------+------------+--------+ FV mid        no                                                 +--------------+---------+----------+-----------+------------+--------+ Popliteal     no                                                 +--------------+---------+----------+-----------+------------+--------+ GSV at SFJ    no                                .66              +--------------+---------+----------+-----------+------------+--------+ GSV prox thighno                                .26              +--------------+---------+----------+-----------+------------+--------+ GSV mid thigh             yes      4526 ms      .27              +--------------+---------+----------+-----------+------------+--------+ GSV dist thighno                                .24              +--------------+---------+----------+-----------+------------+--------+ GSV at knee   no                                .27              +--------------+---------+----------+-----------+------------+--------+ GSV prox calf                                    .59              +--------------+---------+----------+-----------+------------+--------+ SSV Pop Fossa no                                .18              +--------------+---------+----------+-----------+------------+--------+  Summary: Right: - Venous reflux is noted in the right greater saphenous vein in the thigh. - Venous reflux is noted in the right popliteal vein. - Right popliteal artery demonstrate triphasic flow and right PTA demonstrats biphasic flow.  Left: - Venous reflux is noted in the left greater saphenous vein in the thigh. - Left popiteal artery demonstrates triphasic flow and left PTA demonstrates bilateral flow.  *See table(s) above for measurements and observations. Electronically signed by Leotis Pain MD on 12/22/2020 at 12:42:37 PM.    Final     Assessment/Plan  Venous stasis dermatitis Marked venous stasis changes bilaterally.  Ulcer of both feet (HCC) ABIs today are normal at 1.2 bilaterally. Has some venous disease but these are feet ulcers not typical venous stasis ulcers. Continue local wound care. Recheck in a month or so.   Varicose veins of both lower extremities with complications Patient does have reflux in both legs although I am not sure that these ulcers are necessarily directly related to venous insufficiency as they are on the in a typical location for venous stasis ulcers.  He will continue with his compression stockings and local wound care.  We will consider laser ablation in the future.  Return in 1 month.    Leotis Pain, MD  01/12/2021 3:40 PM    This note was created with Dragon medical transcription system.  Any errors from dictation are purely unintentional

## 2021-01-12 NOTE — Assessment & Plan Note (Signed)
ABIs today are normal at 1.2 bilaterally. Has some venous disease but these are feet ulcers not typical venous stasis ulcers. Continue local wound care. Recheck in a month or so.

## 2021-01-12 NOTE — Assessment & Plan Note (Signed)
Marked venous stasis changes bilaterally.

## 2021-01-12 NOTE — Assessment & Plan Note (Signed)
Patient does have reflux in both legs although I am not sure that these ulcers are necessarily directly related to venous insufficiency as they are on the in a typical location for venous stasis ulcers.  He will continue with his compression stockings and local wound care.  We will consider laser ablation in the future.  Return in 1 month.

## 2021-01-15 ENCOUNTER — Telehealth (INDEPENDENT_AMBULATORY_CARE_PROVIDER_SITE_OTHER): Payer: Self-pay

## 2021-01-15 NOTE — Telephone Encounter (Signed)
Marcene Brawn from [employer] left voicemail requesting the recent clinical note. Requesting notes have been sent.

## 2021-02-23 ENCOUNTER — Ambulatory Visit (INDEPENDENT_AMBULATORY_CARE_PROVIDER_SITE_OTHER): Payer: Medicare Other | Admitting: Nurse Practitioner

## 2021-04-27 ENCOUNTER — Encounter: Payer: Self-pay | Admitting: Emergency Medicine

## 2021-04-27 ENCOUNTER — Other Ambulatory Visit: Payer: Self-pay

## 2021-04-27 ENCOUNTER — Emergency Department
Admission: EM | Admit: 2021-04-27 | Discharge: 2021-04-27 | Disposition: A | Discharge status: Home / Self Care | Payer: Medicare Other | Attending: Emergency Medicine | Admitting: Emergency Medicine

## 2021-04-27 DIAGNOSIS — Z7901 Long term (current) use of anticoagulants: Secondary | ICD-10-CM | POA: Diagnosis not present

## 2021-04-27 DIAGNOSIS — I5032 Chronic diastolic (congestive) heart failure: Secondary | ICD-10-CM | POA: Diagnosis not present

## 2021-04-27 DIAGNOSIS — Z8505 Personal history of malignant neoplasm of liver: Secondary | ICD-10-CM | POA: Insufficient documentation

## 2021-04-27 DIAGNOSIS — Z85118 Personal history of other malignant neoplasm of bronchus and lung: Secondary | ICD-10-CM | POA: Diagnosis not present

## 2021-04-27 DIAGNOSIS — Z87891 Personal history of nicotine dependence: Secondary | ICD-10-CM | POA: Insufficient documentation

## 2021-04-27 DIAGNOSIS — Z7982 Long term (current) use of aspirin: Secondary | ICD-10-CM | POA: Insufficient documentation

## 2021-04-27 DIAGNOSIS — I83891 Varicose veins of right lower extremities with other complications: Secondary | ICD-10-CM | POA: Insufficient documentation

## 2021-04-27 DIAGNOSIS — Z79899 Other long term (current) drug therapy: Secondary | ICD-10-CM | POA: Insufficient documentation

## 2021-04-27 DIAGNOSIS — E039 Hypothyroidism, unspecified: Secondary | ICD-10-CM | POA: Insufficient documentation

## 2021-04-27 DIAGNOSIS — I110 Hypertensive heart disease with heart failure: Secondary | ICD-10-CM | POA: Diagnosis not present

## 2021-04-27 DIAGNOSIS — I83899 Varicose veins of unspecified lower extremities with other complications: Secondary | ICD-10-CM

## 2021-04-27 NOTE — ED Provider Notes (Signed)
[facility] Emergency Department Provider Note  ____________________________________________   Event Date/Time   First MD Initiated Contact with Patient 04/27/21 [id number]     (approximate)  I have reviewed the triage vital signs and the nursing notes.   HISTORY  Chief Complaint Leg Pain and Foot Injury    HPI Louis Sanchez is a 80 y.o. male presents emergency department with a bleeding varicose vein.  Patient states he has ulcer on the top of his foot.  Was walking his wife to the bathroom when he felt blood in his shoe.  This happened yesterday.  EMS came out and applied a pressure dressing.  Got up this morning and had additional bleeding.  Called EMS and brought him to the emergency department after applying pressure dressing.  No known injury.  Past Medical History:  Diagnosis Date   Arthritis    Benign neoplasm of colon 2004   Benign neoplasm of colon 04/04/2008   1mm tubular adenoma of the ascending colon   Cancer ([place])    liver and lungs   Heart disease    Other and unspecified hyperlipidemia 1997   Other specified disorder of male genital organs(608.89) 214   Sleep disturbance, unspecified    Thyroid disease    Unspecified hypothyroidism 2005    Patient Active Problem List   Diagnosis Date Noted   Ulcer of both feet ([place]) 01/12/2021   Right ventricular dysfunction 10/27/2020   Acquired thrombophilia ([place]) 60/73/7106   Chronic systolic CHF (congestive heart failure) ([place]) 10/07/2020   Hydropneumothorax 04/08/2020   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/18/2019   Fusion of spine of cervical region 11/18/2019   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/18/2019   Neck pain 11/18/2019   Personal history of skin cancer 05/21/2019   Chronic anticoagulation 12/11/2016   Former tobacco use 12/11/2016   Malignant neoplasm of lower lobe of right lung ([place]) 26/94/8546   Alcoholic cirrhosis of liver without ascites ([place])  10/07/2016   Aortic atherosclerosis ([place]) 10/07/2016   Hypothyroidism (acquired) 10/07/2016   Obstructive sleep apnea syndrome 10/07/2016   Thrombocytopenia ([place]) 10/07/2016   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   Mitral regurgitation 09/20/2016   TIA (transient ischemic attack) 09/20/2016   Liver masses 09/13/2016   Acute bronchitis 09/13/2016   Leukocytosis 09/13/2016   Venous stasis dermatitis 09/13/2016   LFT elevation    Alcoholic gastritis    Atrial fibrillation with RVR ([place]) 09/11/2016   Acute lower GI bleeding 05/23/2016   Anxiety, mild 05/23/2016   Hematochezia 05/23/2016   History of hypothyroidism 05/23/2016   Chronic atrial fibrillation ([place]) 05/03/2016   Ruptured varicose vein 01/25/2016   Varicose veins of both lower extremities with complications 27/09/5007   Mitral valve insufficiency 08/25/2014   Temporary cerebral vascular dysfunction 08/25/2014   Inguinal hernia 02/18/2013   Actinic keratosis 01/21/2010   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 Authorizing Provider  aspirin EC 81 MG tablet Take 81 mg by mouth.    [provider]  atenolol (TENORMIN) 25 MG  tablet Take 0.5 tablets by mouth every evening. 01/21/13   [provider]  B Complex Vitamins (VITAMIN B COMPLEX PO) Take by mouth.    [provider]  B Complex-C (B-COMPLEX WITH VITAMIN C) tablet Take 1 tablet by mouth daily.    [provider]  Cholecalciferol (VITAMIN D3) 1000 units CAPS Take by mouth.    [provider]  Ferrous Sulfate (IRON PO) Take by mouth.    [provider]  fish oil-omega-3  fatty acids 1000 MG capsule Take 2 g by mouth daily.    [provider]  fluocinonide cream (LIDEX) 0.05 % Apply topically. 08/12/15   [provider]  furosemide (LASIX) 40 MG tablet Take 20 mg by mouth daily. 11/23/20   [provider]  glucosamine-chondroitin 500-400 MG tablet Take 1 tablet by mouth 3 (three) times daily.    [provider]  levothyroxine (SYNTHROID) 175 MCG tablet Take 175 mcg by mouth daily. 12/30/20   [provider]  levothyroxine (SYNTHROID) 175 MCG tablet Take by mouth. 12/30/20 12/30/21  [provider]  losartan (COZAAR) 25 MG tablet Take 12.5 mg by mouth daily. 11/16/20   [provider]  metoprolol succinate (TOPROL-XL) 25 MG 24 hr tablet Take 1 tablet by mouth daily. 10/02/20   [provider]  Multiple Vitamin (MULTIVITAMIN) tablet Take 1 tablet by mouth daily.    [provider]  nitroGLYCERIN (NITROSTAT) 0.4 MG SL tablet Place under the tongue. 09/01/20 09/01/21  [provider]  nystatin (MYCOSTATIN) 100000 UNIT/ML suspension Take 5 mLs by mouth 4 (four) times daily. 12/12/20   [provider]  NYSTATIN powder Apply topically 2 (two) times daily. 10/19/20   [provider]  ondansetron (ZOFRAN) 4 MG tablet Take 1 tablet (4 mg total) by mouth every 6 (six) hours as needed for nausea. 09/13/16   Theodoro Grist, MD  pantoprazole (PROTONIX) 40 MG tablet Take 1 tablet (40 mg total) by mouth daily. 09/14/16   Theodoro Grist, MD  potassium chloride (KLOR-CON) 10 MEQ tablet Take by mouth. 08/26/20 08/26/21  [provider]  pravastatin (PRAVACHOL) 20 MG tablet Take 20 mg by mouth. 09/10/14   [provider]  rivaroxaban (XARELTO) 20 MG TABS tablet Take 20 mg by mouth daily with supper.    [provider]  rosuvastatin (CRESTOR) 10 MG tablet Take 10 mg by mouth daily. 07/28/16   [provider]  sertraline (ZOLOFT) 100 MG tablet Take 1 tablet by mouth  daily. 01/21/13   [provider]  spironolactone (ALDACTONE) 50 MG tablet Take 1 tablet (50 mg total) by mouth 2 (two) times daily. 09/13/16   Theodoro Grist, MD  sucralfate (CARAFATE) 1 g tablet Take 1 tablet (1 g total) by mouth 4 (four) times daily -  with meals and at bedtime. 09/13/16   Theodoro Grist, MD  SYNTHROID 50 MCG tablet Take 200 mcg by mouth daily.  01/22/13   [provider]  tretinoin (RETIN-A) 0.025 % cream  03/14/12   [provider]  valACYclovir (VALTREX) 1000 MG tablet Take 1,000 mg by mouth. 05/26/11   [provider]  vitamin C (ASCORBIC ACID) 500 MG tablet Take 500 mg by mouth daily.    [provider]  vitamin E 100 UNIT capsule Take 400 Units by mouth daily.    [provider]    Allergies Atorvastatin and Levofloxacin  Family History  Problem Relation Age of Onset   Alzheimer's disease Mother  Social History Social History   Tobacco Use   Smoking status: Former    Years: 25.00    Types: Cigarettes   Smokeless tobacco: Never  Substance Use Topics   Alcohol use: Yes    Alcohol/week: 15.0 standard drinks    Types: 15 Standard drinks or equivalent per week   Drug use: No    Review of Systems  Constitutional: No fever/chills Eyes: No visual changes. ENT: No sore throat. Respiratory: Denies cough Genitourinary: Negative for dysuria. Musculoskeletal: Negative for back pain. Skin: Negative for rash. Psychiatric: no mood changes,     ____________________________________________   PHYSICAL EXAM:  VITAL SIGNS: ED Triage Vitals  Enc Vitals Group     BP 04/27/21 0734 131/70     Pulse Rate 04/27/21 0734 89     Resp 04/27/21 0734 17     Temp 04/27/21 0734 98.5 F (36.9 C)     Temp Source 04/27/21 0734 Oral     SpO2 04/27/21 0734 96 %     Weight 04/27/21 0742 134 lb 14.7 oz (61.2 kg)     Height 04/27/21 0742 5\' 7"  (1.702 m)     Head Circumference --      Peak Flow --      Pain Score  04/27/21 0742 3     Pain Loc --      Pain Edu? --      Excl. in [place]? --     Constitutional: Alert and oriented. Well appearing and in no acute distress. Eyes: Conjunctivae are normal.  Head: Atraumatic. Nose: No congestion/rhinnorhea. Mouth/Throat: Mucous membranes are moist.   Neck:  supple no lymphadenopathy noted Cardiovascular: Normal rate, regular rhythm.  Respiratory: Normal respiratory effort.  No retractions,  GU: deferred Musculoskeletal: FROM all extremities, warm and well perfused, no active bleeding noted on the right foot, area was cleaned, superficial ulcer noted on the dorsum of the right foot, dressing was applied. Neurologic:  Normal speech and language.  Skin:  Skin is warm, dry . No rash noted. Psychiatric: Mood and affect are normal. Speech and behavior are normal.  ____________________________________________   LABS (all labs ordered are listed, but only abnormal results are displayed)  Labs Reviewed - No data to display ____________________________________________   ____________________________________________  RADIOLOGY    ____________________________________________   PROCEDURES  Procedure(s) performed: No  Procedures    ____________________________________________   INITIAL IMPRESSION / ASSESSMENT AND PLAN / ED COURSE  Pertinent labs & imaging results that were available during my care of the patient were reviewed by me and considered in my medical decision making (see chart for details).   Patient 80 year old male presents with bleeding from a varicose vein on the right foot.  See HPI.  Physical exam is consistent with the same.  Foot was cleaned, dressing applied, foot elevated.  No active bleeding noted.  Patient was able to walk up and down the hallway with no bleeding noted.  I did recheck his dressing which did not show any blood.  Feel that he can return home and keep the foot elevated for the day.  Explained to him he is to keep it  as dry as possible.  If he begins to bleed again he can return emergency department we will have to cauterize the area.  Patient and his son are in agreement with treatment plan.  He is discharged stable condition.     Louis Sanchez was evaluated in Emergency Department on 04/27/2021 for the symptoms described in the history  of present illness. He was evaluated in the context of the global COVID-19 pandemic, which necessitated consideration that the patient might be at risk for infection with the SARS-CoV-2 virus that causes COVID-19. Institutional protocols and algorithms that pertain to the evaluation of patients at risk for COVID-19 are in a state of rapid change based on information released by regulatory bodies including the CDC and federal and state organizations. These policies and algorithms were followed during the patient's care in the ED.    As part of my medical decision making, I reviewed the following data within the [facility] notes reviewed and incorporated, Old chart reviewed, Notes from prior ED visits, and [place] Controlled Substance Database  ____________________________________________   FINAL CLINICAL IMPRESSION(S) / ED DIAGNOSES  Final diagnoses:  Bleeding from varicose vein      NEW MEDICATIONS STARTED DURING THIS VISIT:  New Prescriptions   No medications on file     Note:  This document was prepared using Dragon voice recognition software and may include unintentional dictation errors.    Versie Starks, PA-C 04/27/21 2518    Blake Divine, MD 04/27/21 [phone]

## 2021-04-27 NOTE — ED Notes (Signed)
Walked patient to the end of the hallway and back to room. Patient did very well, no bleeding took place.

## 2021-04-27 NOTE — Discharge Instructions (Addendum)
Follow up with your regular doctor as needed Elevate your foot Keep the area as dry as possible for the next few days Return if worsening

## 2021-04-27 NOTE — ED Triage Notes (Signed)
Presents via EMS from home  developed bleeding to right foot last pm  denies any  injury  pt describes area as an ulcer to top of foot    pressure dressing intact on arrival

## 2021-09-24 ENCOUNTER — Other Ambulatory Visit: Payer: Self-pay

## 2021-09-24 ENCOUNTER — Ambulatory Visit (INDEPENDENT_AMBULATORY_CARE_PROVIDER_SITE_OTHER): Payer: Medicare Other | Admitting: Nurse Practitioner

## 2021-09-24 ENCOUNTER — Encounter (INDEPENDENT_AMBULATORY_CARE_PROVIDER_SITE_OTHER): Payer: Self-pay | Admitting: Nurse Practitioner

## 2021-09-24 VITALS — BP 123/74 | HR 97 | Resp 16 | Wt 152.0 lb

## 2021-09-24 DIAGNOSIS — I83893 Varicose veins of bilateral lower extremities with other complications: Secondary | ICD-10-CM

## 2021-09-24 DIAGNOSIS — L97511 Non-pressure chronic ulcer of other part of right foot limited to breakdown of skin: Secondary | ICD-10-CM

## 2021-09-24 DIAGNOSIS — L97521 Non-pressure chronic ulcer of other part of left foot limited to breakdown of skin: Secondary | ICD-10-CM | POA: Diagnosis not present

## 2021-09-24 MED ORDER — SILVER SULFADIAZINE 1 % EX CREA: 1.0000 "application " | TOPICAL_CREAM | Freq: Every day | CUTANEOUS | 0 refills | Status: DC

## 2021-09-25 ENCOUNTER — Encounter (INDEPENDENT_AMBULATORY_CARE_PROVIDER_SITE_OTHER): Payer: Self-pay | Admitting: Nurse Practitioner

## 2021-09-25 NOTE — Progress Notes (Signed)
Subjective:    Patient ID: Louis Sanchez, male    DOB: 07-01-1941, 81 y.o.   MRN: [id number] Chief Complaint  Patient presents with   New Patient (Initial Visit)    Ref Caryl Comes symptomatic ble varicose veins    Louis Sanchez is a 81 year old male that presents to [place] for wounds on his bilateral feet.  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  The left great saphenous vein had reflux in the mid thigh portion.  The patient notes that he has ulcers to his bilateral feet on the dorsal surface.  He notes that these wounds come and go and they are very painful.  He notes that they are associated with extensive drainage and usually blistering before they occur.  He also has some notable spider varicosities and notes that these will also have episodes or these rupture and bleed.   Review of Systems  Cardiovascular:  Positive for leg swelling.  Musculoskeletal:  Positive for arthralgias.  Skin:  Positive for wound.  Hematological:  Bruises/bleeds easily.  All other systems reviewed and are negative.     Objective:   Physical Exam Vitals reviewed.  Cardiovascular:     Rate and Rhythm: Normal rate.  Pulmonary:     Effort: Pulmonary effort is normal.  Musculoskeletal:       Feet:  Skin:    General: Skin is warm and dry.  Neurological:     Mental Status: He is alert and oriented to person, place, and time.  Psychiatric:        Mood and Affect: Mood normal.        Behavior: Behavior normal.        Thought Content: Thought content normal.        Judgment: Judgment normal.    BP 123/74 (BP Location: Right Arm)    Pulse 97    Resp 16    Wt 152 lb (68.9 kg)    BMI 23.81 kg/m   Past Medical History:  Diagnosis Date   Arthritis    Benign neoplasm of colon 2004   Benign neoplasm of colon  04/04/2008   38mm tubular adenoma of the ascending colon   Cancer ([facility])    liver and lungs   Heart disease    Other and unspecified hyperlipidemia 1997   Other specified disorder of male genital organs(608.89) 214   Sleep disturbance, unspecified    Thyroid disease    Unspecified hypothyroidism 2005    Social History   Socioeconomic History   Marital status: Married    Spouse name: Not on file   Number of children: Not on file   Years of education: Not on file   Highest education level: Not on file  Occupational History   Not on file  Tobacco Use   Smoking status: Former    Years: 25.00    Types: Cigarettes   Smokeless tobacco: Never  Substance and Sexual Activity   Alcohol use: Yes    Alcohol/week: 15.0 standard drinks    Types: 15 Standard drinks or equivalent per week   Drug use: No   Sexual activity: Not on file  Other Topics Concern   Not on file  Social History Narrative   Not on file   Social Determinants of Health   Financial  Resource Strain: Not on file  Food Insecurity: Not on file  Transportation Needs: Not on file  Physical Activity: Not on file  Stress: Not on file  Social Connections: Not on file  Intimate Partner Violence: Not on file    Past Surgical History:  Procedure Laterality Date   APPENDECTOMY  1953   colon polyps  2009   [place]   Right   INGUINAL HERNIA REPAIR Right 2014   SPINE SURGERY  1997   neck   WRIST SURGERY      Family History  Problem Relation Age of Onset   Alzheimer's disease Mother     Allergies  Allergen Reactions   Atorvastatin     Other reaction(s): Muscle Pain Other reaction(s): Muscle Pain Other reaction(s): Muscle Pain Other reaction(s): Muscle Pain Other reaction(s): Muscle Pain    Levofloxacin Rash    CBC Latest Ref Rng & Units 09/12/2016 09/11/2016 01/30/2014  WBC 3.8 - 10.6 K/uL 10.0 12.3(H) 7.4  Hemoglobin 13.0 - 18.0 g/dL 13.3 13.9 14.6  Hematocrit 40.0 - 52.0 % 39.8(L) 42.0 43.9   Platelets 150 - 440 K/uL 135(L) 156 140(L)      CMP     Component Value Date/Time   NA 137 09/12/2016 0416   NA 139 01/30/2014 1705   K 3.8 09/12/2016 0416   K 4.4 01/30/2014 1705   CL 104 09/12/2016 0416   CL 102 01/30/2014 1705   CO2 26 09/12/2016 0416   CO2 29 01/30/2014 1705   GLUCOSE 156 (H) 09/12/2016 0416   GLUCOSE 109 (H) 01/30/2014 1705   BUN 15 09/12/2016 0416   BUN 21 (H) 01/30/2014 1705   CREATININE 0.76 09/12/2016 0416   CREATININE 1.03 01/30/2014 1705   CALCIUM 8.3 (L) 09/12/2016 0416   CALCIUM 9.2 01/30/2014 1705   PROT 6.2 (L) 09/13/2016 0355   PROT 7.2 01/30/2014 1705   ALBUMIN 2.9 (L) 09/13/2016 0355   ALBUMIN 3.8 01/30/2014 1705   AST 46 (H) 09/13/2016 0355   AST 58 (H) 01/30/2014 1705   ALT 102 (H) 09/13/2016 0355   ALT 74 01/30/2014 1705   ALKPHOS 324 (H) 09/13/2016 0355   ALKPHOS 75 01/30/2014 1705   BILITOT 0.9 09/13/2016 0355   BILITOT 0.6 01/30/2014 1705   GFRNONAA >60 09/12/2016 0416   GFRNONAA >60 01/30/2014 1705   GFRAA >60 09/12/2016 0416   GFRAA >60 01/30/2014 1705     No results found.     Assessment & Plan:   1. Ulcer of both feet, limited to breakdown of skin ([place]) The patient has had these wounds on both feet off and on for months.  He is yet to see wound care.  We will refer the patient to wound care for input on treatment for these wounds. - Ambulatory referral to Wound Clinic  2. Varicose veins of both lower extremities with complications Previous noninvasive study showed that he had a stripping of his right great saphenous vein.  Typically venous wounds or ulcerations at the byproduct of venous insufficiency however given that the vein was stripped and the atypical location of the wounds this is likely not related to venous changes.  The patient does note however that of the varicosities he has in his feet they are prone to bleeding sometimes.  The patient return for reflux studies of his bilateral lower extremities to  determine if treatment for the small varicosities will help to stop and slow the bleeding he has for the varicosities.  Otherwise  he is advised to try to engage in conservative therapy including use of compression, elevation and activity.   Current Outpatient Medications on File Prior to Visit  Medication Sig Dispense Refill   acetaminophen (ACETAMINOPHEN 8 HOUR) 650 MG CR tablet Take 650 mg by mouth every 8 (eight) hours as needed for pain.     Ferrous Sulfate (IRON PO) Take by mouth.     furosemide (LASIX) 40 MG tablet Take 20 mg by mouth daily.     gabapentin (NEURONTIN) 300 MG capsule Take 600 mg by mouth 3 (three) times daily.     levothyroxine (SYNTHROID) 175 MCG tablet Take by mouth.     losartan (COZAAR) 25 MG tablet Take 12.5 mg by mouth daily.     metoprolol succinate (TOPROL-XL) 25 MG 24 hr tablet Take 1 tablet by mouth daily.     Multiple Vitamin (MULTIVITAMIN) tablet Take 1 tablet by mouth daily.     nitroGLYCERIN (NITROSTAT) 0.4 MG SL tablet Place under the tongue.     nystatin (MYCOSTATIN) 100000 UNIT/ML suspension Take 5 mLs by mouth 4 (four) times daily.     NYSTATIN powder Apply topically as needed.     potassium chloride (KLOR-CON) 10 MEQ tablet Take 10 mEq by mouth as needed.     rivaroxaban (XARELTO) 20 MG TABS tablet Take 20 mg by mouth daily with supper.     rosuvastatin (CRESTOR) 10 MG tablet Take 10 mg by mouth daily.     sertraline (ZOLOFT) 100 MG tablet Take 1 tablet by mouth daily.     aspirin EC 81 MG tablet Take 81 mg by mouth. (Patient not taking: Reported on 09/24/2021)     atenolol (TENORMIN) 25 MG tablet Take 0.5 tablets by mouth every evening. (Patient not taking: Reported on 09/24/2021)     B Complex Vitamins (VITAMIN B COMPLEX PO) Take by mouth. (Patient not taking: Reported on 09/24/2021)     B Complex-C (B-COMPLEX WITH VITAMIN C) tablet Take 1 tablet by mouth daily. (Patient not taking: Reported on 09/24/2021)     Cholecalciferol (VITAMIN D3) 1000 units CAPS  Take by mouth. (Patient not taking: Reported on 09/24/2021)     fish oil-omega-3 fatty acids 1000 MG capsule Take 2 g by mouth daily. (Patient not taking: Reported on 09/24/2021)     fluocinonide cream (LIDEX) 0.05 % Apply topically. (Patient not taking: Reported on 09/24/2021)     glucosamine-chondroitin 500-400 MG tablet Take 1 tablet by mouth 3 (three) times daily. (Patient not taking: Reported on 09/24/2021)     levothyroxine (SYNTHROID) 175 MCG tablet Take 175 mcg by mouth daily. (Patient not taking: Reported on 09/24/2021)     ondansetron (ZOFRAN) 4 MG tablet Take 1 tablet (4 mg total) by mouth every 6 (six) hours as needed for nausea. (Patient not taking: Reported on 09/24/2021) 20 tablet 0   pantoprazole (PROTONIX) 40 MG tablet Take 1 tablet (40 mg total) by mouth daily. (Patient not taking: Reported on 09/24/2021) 30 tablet 5   pravastatin (PRAVACHOL) 20 MG tablet Take 20 mg by mouth. (Patient not taking: Reported on 09/24/2021)     spironolactone (ALDACTONE) 50 MG tablet Take 1 tablet (50 mg total) by mouth 2 (two) times daily. (Patient not taking: Reported on 09/24/2021) 60 tablet 6   sucralfate (CARAFATE) 1 g tablet Take 1 tablet (1 g total) by mouth 4 (four) times daily -  with meals and at bedtime. (Patient not taking: Reported on 09/24/2021) 90 tablet 0   SYNTHROID 50  MCG tablet Take 200 mcg by mouth daily.  (Patient not taking: Reported on 09/24/2021)     tretinoin (RETIN-A) 0.025 % cream  (Patient not taking: Reported on 09/24/2021)     valACYclovir (VALTREX) 1000 MG tablet Take 1,000 mg by mouth. (Patient not taking: Reported on 09/24/2021)     vitamin C (ASCORBIC ACID) 500 MG tablet Take 500 mg by mouth daily. (Patient not taking: Reported on 09/24/2021)     vitamin E 100 UNIT capsule Take 400 Units by mouth daily. (Patient not taking: Reported on 09/24/2021)     No current facility-administered medications on file prior to visit.    There are no Patient Instructions on file for this visit. No follow-ups on  file.   Kris Hartmann, NP

## 2021-09-28 ENCOUNTER — Other Ambulatory Visit (INDEPENDENT_AMBULATORY_CARE_PROVIDER_SITE_OTHER): Payer: Self-pay | Admitting: Nurse Practitioner

## 2021-09-28 DIAGNOSIS — I83893 Varicose veins of bilateral lower extremities with other complications: Secondary | ICD-10-CM

## 2021-09-28 DIAGNOSIS — L97521 Non-pressure chronic ulcer of other part of left foot limited to breakdown of skin: Secondary | ICD-10-CM

## 2021-09-29 ENCOUNTER — Ambulatory Visit (INDEPENDENT_AMBULATORY_CARE_PROVIDER_SITE_OTHER): Payer: Medicare Other

## 2021-09-29 ENCOUNTER — Other Ambulatory Visit: Payer: Self-pay

## 2021-09-29 ENCOUNTER — Ambulatory Visit (INDEPENDENT_AMBULATORY_CARE_PROVIDER_SITE_OTHER): Payer: Medicare Other | Admitting: Nurse Practitioner

## 2021-09-29 ENCOUNTER — Encounter (INDEPENDENT_AMBULATORY_CARE_PROVIDER_SITE_OTHER): Payer: Self-pay | Admitting: Nurse Practitioner

## 2021-09-29 VITALS — BP 118/77 | HR 114 | Resp 16 | Wt 146.0 lb

## 2021-09-29 DIAGNOSIS — L97521 Non-pressure chronic ulcer of other part of left foot limited to breakdown of skin: Secondary | ICD-10-CM | POA: Diagnosis not present

## 2021-09-29 DIAGNOSIS — L97511 Non-pressure chronic ulcer of other part of right foot limited to breakdown of skin: Secondary | ICD-10-CM

## 2021-09-29 DIAGNOSIS — I83893 Varicose veins of bilateral lower extremities with other complications: Secondary | ICD-10-CM

## 2021-10-02 ENCOUNTER — Other Ambulatory Visit: Payer: Self-pay

## 2021-10-02 ENCOUNTER — Emergency Department: Payer: Medicare Other

## 2021-10-02 ENCOUNTER — Encounter: Payer: Self-pay | Admitting: Radiology

## 2021-10-02 ENCOUNTER — Emergency Department
Admission: EM | Admit: 2021-10-02 | Discharge: 2021-10-02 | Disposition: A | Discharge status: Home / Self Care | Payer: Medicare Other | Attending: Emergency Medicine | Admitting: Emergency Medicine

## 2021-10-02 DIAGNOSIS — C3490 Malignant neoplasm of unspecified part of unspecified bronchus or lung: Secondary | ICD-10-CM | POA: Insufficient documentation

## 2021-10-02 DIAGNOSIS — R0602 Shortness of breath: Secondary | ICD-10-CM | POA: Insufficient documentation

## 2021-10-02 DIAGNOSIS — Z9221 Personal history of antineoplastic chemotherapy: Secondary | ICD-10-CM | POA: Insufficient documentation

## 2021-10-02 DIAGNOSIS — R55 Syncope and collapse: Secondary | ICD-10-CM | POA: Insufficient documentation

## 2021-10-02 LAB — CBC WITH DIFFERENTIAL/PLATELET
Abs Immature Granulocytes: 0.04 10*3/uL (ref 0.00–0.07)
Basophils Absolute: 0 10*3/uL (ref 0.0–0.1)
Basophils Relative: 1 %
Eosinophils Absolute: 0.1 10*3/uL (ref 0.0–0.5)
Eosinophils Relative: 1 %
HCT: 34.1 % — ABNORMAL LOW (ref 39.0–52.0)
Hemoglobin: 10.1 g/dL — ABNORMAL LOW (ref 13.0–17.0)
Immature Granulocytes: 1 %
Lymphocytes Relative: 8 %
Lymphs Abs: 0.6 10*3/uL — ABNORMAL LOW (ref 0.7–4.0)
MCH: 27.4 pg (ref 26.0–34.0)
MCHC: 29.6 g/dL — ABNORMAL LOW (ref 30.0–36.0)
MCV: 92.4 fL (ref 80.0–100.0)
Monocytes Absolute: 0.8 10*3/uL (ref 0.1–1.0)
Monocytes Relative: 12 %
Neutro Abs: 5.5 10*3/uL (ref 1.7–7.7)
Neutrophils Relative %: 77 %
Platelets: 223 10*3/uL (ref 150–400)
RBC: 3.69 MIL/uL — ABNORMAL LOW (ref 4.22–5.81)
RDW: 17.7 % — ABNORMAL HIGH (ref 11.5–15.5)
WBC: 7 10*3/uL (ref 4.0–10.5)
nRBC: 0 % (ref 0.0–0.2)

## 2021-10-02 LAB — COMPREHENSIVE METABOLIC PANEL
ALT: 22 U/L (ref 0–44)
AST: 35 U/L (ref 15–41)
Albumin: 3.5 g/dL (ref 3.5–5.0)
Alkaline Phosphatase: 106 U/L (ref 38–126)
Anion gap: 9 (ref 5–15)
BUN: 24 mg/dL — ABNORMAL HIGH (ref 8–23)
CO2: 31 mmol/L (ref 22–32)
Calcium: 8.9 mg/dL (ref 8.9–10.3)
Chloride: 97 mmol/L — ABNORMAL LOW (ref 98–111)
Creatinine, Ser: 0.83 mg/dL (ref 0.61–1.24)
GFR, Estimated: >60 mL/min (ref >60)
Glucose, Bld: 111 mg/dL — ABNORMAL HIGH (ref 70–99)
Potassium: 4.5 mmol/L (ref 3.5–5.1)
Sodium: 137 mmol/L (ref 135–145)
Total Bilirubin: 0.3 mg/dL (ref 0.3–1.2)
Total Protein: 8.1 g/dL (ref 6.5–8.1)

## 2021-10-02 LAB — TROPONIN I (HIGH SENSITIVITY)
Troponin I (High Sensitivity): 10 ng/L (ref <18)
Troponin I (High Sensitivity): 10 ng/L (ref <18)

## 2021-10-02 MED ORDER — IOHEXOL 350 MG/ML SOLN
75.0000 mL | Freq: Once | INTRAVENOUS | Status: AC | PRN
  Administered 2021-10-02: 75 mL via INTRAVENOUS

## 2021-10-02 NOTE — ED Notes (Signed)
Patient taken to CT via stretcher.

## 2021-10-02 NOTE — ED Notes (Addendum)
Despite being told to remain in the bed and given a urinal, patient decided to get out of the bed. He states that he was getting dizzy before he even got out of the bed. I tried to explain to the patient that he was told to remain in the bed for his safety due to this dizziness and his syncopal episode. Patient verbalized understanding again. Provider was made aware. ?

## 2021-10-02 NOTE — ED Notes (Signed)
Patient is resting comfortably on stretcher. RR even and unlabored. Patient verbalizes no needs or complaints at this time. Patient updated on wait time to re-assessment by provider and update on plan of care. ?

## 2021-10-02 NOTE — ED Provider Notes (Signed)
? ?[facility] ?Provider Note ? ? ? Event Date/Time  ? First MD Initiated Contact with Patient 10/02/21 1818   ?  (approximate) ? ? ?History  ? ?Loss of Consciousness ? ? ?HPI ? ?ROCKO FESPERMAN is a 81 y.o. male with a history of chronic atrial fibrillation, ?lung cancer undergoing chemotherapy who comes to the ED today due to possible syncope.  He was in his usual state of health, asymptomatic, eating a snack in his kitchen when he suddenly found himself waking up on the floor.  Denies any acute preceding symptoms such as chest pain abdominal pain headache vision changes shortness of breath paresthesias or motor weakness.  No recent illness vomiting or diarrhea.  Normal eating and drinking.  He denies any new symptoms after waking up. ? ?Patient does report that due to caring for his spouse who has severe chronic physical disability, he gets very little sleep and is chronically sleep deprived.  This results in daytime sleepiness, and he does note that he falls asleep unexpectedly frequently.  This includes while eating and while driving during a stop in traffic.  He acknowledges that he should not drive under the circumstances. ? ?Patient also reports for the past 2 months he has had constant shortness of breath. ?  ? ? ?Physical Exam  ? ?Triage Vital Signs: ?ED Triage Vitals  ?Enc Vitals Group  ?   BP 10/02/21 1809 (!) 149/90  ?   Pulse Rate 10/02/21 1809 92  ?   Resp 10/02/21 1809 18  ?   Temp 10/02/21 1809 98.1 ?F (36.7 ?C)  ?   Temp Source 10/02/21 1809 Oral  ?   SpO2 10/02/21 1806 96 %  ?   Weight 10/02/21 1807 146 lb (66.2 kg)  ?   Height 10/02/21 1807 5\' 7"  (1.702 m)  ?   Head Circumference --   ?   Peak Flow --   ?   Pain Score --   ?   Pain Loc --   ?   Pain Edu? --   ?   Excl. in [place]? --   ? ? ?Most recent vital signs: ?Vitals:  ? 10/02/21 1900 10/02/21 1930  ?BP: 123/78 123/78  ?Pulse: 87 88  ?Resp: 18 15  ?Temp:    ?SpO2: 96% 97%  ? ? ? ?General: Awake, no distress.  ?CV:  Good  peripheral perfusion.  Regular rate and rhythm ?Resp:  Normal effort.  Clear to auscultation bilaterally ?Abd:  No distention.  Soft and nontender.  Small umbilical hernia which is reducible and nontender ?Other:  Chronic lymphedema bilateral lower extremities, nontender.  Symmetric.  At baseline per patient ? ? ?ED Results / Procedures / Treatments  ? ?Labs ?(all labs ordered are listed, but only abnormal results are displayed) ?Labs Reviewed  ?COMPREHENSIVE METABOLIC PANEL - Abnormal; Notable for the following components:  ?    Result Value  ? Chloride 97 (*)   ? Glucose, Bld 111 (*)   ? BUN 24 (*)   ? All other components within normal limits  ?CBC WITH DIFFERENTIAL/PLATELET - Abnormal; Notable for the following components:  ? RBC 3.69 (*)   ? Hemoglobin 10.1 (*)   ? HCT 34.1 (*)   ? MCHC 29.6 (*)   ? RDW 17.7 (*)   ? Lymphs Abs 0.6 (*)   ? All other components within normal limits  ?TROPONIN I (HIGH SENSITIVITY)  ?TROPONIN I (HIGH SENSITIVITY)  ? ? ? ?EKG ? ?  Interpreted by me ?Normal sinus rhythm rate of 78.  Right axis.  Normal intervals.  Inferior Q waves.  Normal ST segments.  Normal T waves.  No ischemic changes. ? ? ?RADIOLOGY ?CT head and cervical spine viewed and interpreted by me, negative for intracranial hemorrhage or C-spine fracture.  Radiology report reviewed. ? ?Chest x-ray viewed by me, unremarkable.  Radiology report reviewed ? ?PROCEDURES: ? ?Critical Care performed: no ? ?Procedures ? ? ?MEDICATIONS ORDERED IN ED: ?Medications  ?iohexol (OMNIPAQUE) 350 MG/ML injection 75 mL (75 mLs Intravenous Contrast Given 10/02/21 2002)  ? ? ? ?IMPRESSION / MDM / ASSESSMENT AND PLAN / ED COURSE  ?I reviewed the triage vital signs and the nursing notes. ?             ?               ? ?Differential diagnosis includes, but is not limited to, intracranial hemorrhage, fracture, pneumonia, electrolyte abnormality, dehydration, anemia, pulmonary embolism ? ?**The patient is on the cardiac monitor to evaluate for  evidence of arrhythmia and/or significant heart rate changes.**} ? ?Patient presents with a loss of consciousness, possibly syncope versus a somnolence spell and falling asleep.  He does report some chronic shortness of breath, and EKG shows suggestion of right heart strain.  With his lung cancer, I will obtain a CT angiogram to rule out PE.  I doubt stroke, dissection, ACS, AAA ? ?Clinical Course as of 10/02/21 2135  ?Sat Oct 02, 2021  ?2127 Remains asymptomatic.  CT chest negative.  Serial troponins negative.  Son at bedside now, reports that patient's CPAP has been broken the last 2 months which corresponds with his symptoms.  They will follow-up with primary care on Monday to obtain new order for CPAP. [PS]  ?  ?Clinical Course User Index ?[PS] Carrie Mew, MD  ? ? ? ?FINAL CLINICAL IMPRESSION(S) / ED DIAGNOSES  ? ?Final diagnoses:  ?Syncope, unspecified syncope type  ? ? ? ?Rx / DC Orders  ? ?ED Discharge Orders   ? ?      Ordered  ?  For home use only DME continuous positive airway pressure (CPAP)       ? 10/02/21 2130  ? ?  ?  ? ?  ? ? ? ?Note:  This document was prepared using Dragon voice recognition software and may include unintentional dictation errors. ?  ?Carrie Mew, MD ?10/02/21 2136 ? ?

## 2021-10-02 NOTE — ED Notes (Signed)
Discharge instructions with follow-up provided to patient. Patient and son verbalized understanding. Patient taken out by wheelchair by this nurse. ?

## 2021-10-02 NOTE — ED Triage Notes (Signed)
Pt states he remembers standing but woke up on the floor. Unsure of what happened. ?

## 2021-10-11 ENCOUNTER — Encounter (INDEPENDENT_AMBULATORY_CARE_PROVIDER_SITE_OTHER): Payer: Self-pay | Admitting: Nurse Practitioner

## 2021-10-11 NOTE — Progress Notes (Signed)
? ?Subjective:  ? ? Patient ID: Louis Sanchez, male    DOB: [birth year], 81 y.o.   MRN: [id number] ?Chief Complaint  ?Patient presents with  ? Follow-up  ?  Ultrasound follow up  ? ? ?Louis Sanchez is a 81 year old male that presents to [place] for wounds on his bilateral feet.  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  The left great saphenous vein had reflux in the mid thigh portion.  The patient notes that he has ulcers to his bilateral feet on the dorsal surface.  He notes that these wounds come and go and they are very painful.  He notes that they are associated with extensive drainage and usually blistering before they occur.  He also has some notable spider varicosities and notes that these will also have episodes or these rupture and bleed.  He has had multiple episodes of rupturing bleeding that have been difficult to stop.  When he does not have wounds he has tried to wear compression but it is difficult with the open ulcerations on his feet. ? ?Today noninvasive studies show evidence of previous endovenous laser ablation of the right great saphenous vein with no reflux in the left great saphenous vein.  No evidence of DVT or superficial thrombophlebitis. ? ?For ABIs the patient has a right ABI 1.26 and a left of 1.20.  He has triphasic waveforms in the right tibial arteries with biphasic in the left.  There are normal toe waveforms bilaterally. ? ? ?Review of Systems  ?Cardiovascular:  Positive for leg swelling.  ?Musculoskeletal:  Positive for arthralgias.  ?Skin:  Positive for wound.  ?Hematological:  Bruises/bleeds easily.  ?All other systems reviewed and are negative. ? ?   ?Objective:  ? Physical Exam ?Vitals reviewed.  ?HENT:  ?   Head: Normocephalic.  ?Cardiovascular:  ?   Rate and Rhythm: Normal  rate.  ?Pulmonary:  ?   Effort: Pulmonary effort is normal.  ?Musculoskeletal:  ?     Feet: ? ?Skin: ?   General: Skin is warm and dry.  ?   Comments: Numerous spider varicosities with stasis dermatitis  ?Neurological:  ?   Mental Status: He is alert and oriented to person, place, and time.  ?Psychiatric:     ?   Mood and Affect: Mood normal.     ?   Behavior: Behavior normal.     ?   Thought Content: Thought content normal.     ?   Judgment: Judgment normal.  ? ? ?BP 118/77 (BP Location: Left Arm)   Pulse (!) 114   Resp 16   Wt 146 lb (66.2 kg)   BMI 22.87 kg/m?  ? ?Past Medical History:  ?Diagnosis Date  ? Arthritis   ? Benign neoplasm of colon 2004  ? Benign neoplasm of colon 04/04/2008  ? 32mm tubular adenoma of the ascending colon  ? Cancer [facility])   ? liver and lungs  ? Heart disease   ? Other and unspecified hyperlipidemia 1997  ? Other specified disorder of male genital organs(608.89) 214  ? Sleep disturbance, unspecified   ? Thyroid disease   ? Unspecified hypothyroidism 2005  ? ? ?Social History  ? ?Socioeconomic History  ? Marital status: Married  ?  Spouse name: Not on file  ?  Number of children: Not on file  ? Years of education: Not on file  ? Highest education level: Not on file  ?Occupational History  ? Not on file  ?Tobacco Use  ? Smoking status: Former  ?  Years: 25.00  ?  Types: Cigarettes  ? Smokeless tobacco: Never  ?Substance and Sexual Activity  ? Alcohol use: Yes  ?  Alcohol/week: 15.0 standard drinks  ?  Types: 15 Standard drinks or equivalent per week  ? Drug use: No  ? Sexual activity: Not on file  ?Other Topics Concern  ? Not on file  ?Social History Narrative  ? Not on file  ? ?Social Determinants of Health  ? ?Financial Resource Strain: Not on file  ?Food Insecurity: Not on file  ?Transportation Needs: Not on file  ?Physical Activity: Not on file  ?Stress: Not on file  ?Social Connections: Not on file  ?Intimate Partner Violence: Not on file  ? ? ?Past Surgical History:  ?Procedure  Laterality Date  ? APPENDECTOMY  1953  ? colon polyps  2009  ? HERNIA REPAIR  1957  ? Right  ? INGUINAL HERNIA REPAIR Right 2014  ? [place]  ? neck  ? WRIST SURGERY    ? ? ?Family History  ?Problem Relation Age of Onset  ? Alzheimer's disease Mother   ? ? ?Allergies  ?Allergen Reactions  ? Atorvastatin   ?  Other reaction(s): Muscle Pain ?Other reaction(s): Muscle Pain ?Other reaction(s): Muscle Pain ?Other reaction(s): Muscle Pain ?Other reaction(s): Muscle Pain ?  ? Levofloxacin Rash  ? ? ?CBC Latest Ref Rng & Units 10/02/2021 09/12/2016 09/11/2016  ?WBC 4.0 - 10.5 K/uL 7.0 10.0 12.3(H)  ?Hemoglobin 13.0 - 17.0 g/dL 10.1(L) 13.3 13.9  ?Hematocrit 39.0 - 52.0 % 34.1(L) 39.8(L) 42.0  ?Platelets 150 - 400 K/uL 223 135(L) 156  ? ? ? ? ?CMP  ?   ?Component Value Date/Time  ? NA 137 10/02/2021 1819  ? NA 139 01/30/2014 1705  ? K 4.5 10/02/2021 1819  ? K 4.4 01/30/2014 1705  ? CL 97 (L) 10/02/2021 1819  ? CL 102 01/30/2014 1705  ? CO2 31 10/02/2021 1819  ? CO2 29 01/30/2014 1705  ? GLUCOSE 111 (H) 10/02/2021 1819  ? GLUCOSE 109 (H) 01/30/2014 1705  ? BUN 24 (H) 10/02/2021 1819  ? BUN 21 (H) 01/30/2014 1705  ? CREATININE 0.83 10/02/2021 1819  ? CREATININE 1.03 01/30/2014 1705  ? CALCIUM 8.9 10/02/2021 1819  ? CALCIUM 9.2 01/30/2014 1705  ? PROT 8.1 10/02/2021 1819  ? PROT 7.2 01/30/2014 1705  ? ALBUMIN 3.5 10/02/2021 1819  ? ALBUMIN 3.8 01/30/2014 1705  ? AST 35 10/02/2021 1819  ? AST 58 (H) 01/30/2014 1705  ? ALT 22 10/02/2021 1819  ? ALT 74 01/30/2014 1705  ? ALKPHOS 106 10/02/2021 1819  ? ALKPHOS 75 01/30/2014 1705  ? BILITOT 0.3 10/02/2021 1819  ? BILITOT 0.6 01/30/2014 1705  ? GFRNONAA >60 10/02/2021 1819  ? GFRNONAA >60 01/30/2014 1705  ? GFRAA >60 09/12/2016 0416  ? GFRAA >60 01/30/2014 1705  ? ? ? ?VAS [place] ABI WITH/WO TBI ? ?Result Date: 10/04/2021 ? [place] STUDY Patient Name:  Louis Sanchez  Date of Exam:   09/29/2021 Medical Rec #: [id number]      Accession #:    [id number] Date of Birth:  1940/08/22      Patient Gender: M Patient Age:   27 years Exam Location:  [facility] Procedure:      VAS [place] ABI WITH/WO TBI Referring Phys: Leotis Pain --------------------------------------------------------------------------------  Indications: Ulceration. Other Factors: Slow healing top of feet wounds.  Comparison Study: 01/12/2021 Performing Technologist: Almira Coaster RVS  Examination Guidelines: A complete evaluation includes at minimum, Doppler waveform signals and systolic blood pressure reading at the level of bilateral brachial, anterior tibial, and posterior tibial arteries, when vessel segments are accessible. Bilateral testing is considered an integral part of a complete examination. Photoelectric Plethysmograph (PPG) waveforms and toe systolic pressure readings are included as required and additional duplex testing as needed. Limited examinations for reoccurring indications may be performed as noted.  ABI Findings: +---------+------------------+-----+---------+--------+ Right    Rt Pressure (mmHg)IndexWaveform Comment  +---------+------------------+-----+---------+--------+ Brachial 106                                      +---------+------------------+-----+---------+--------+ ATA      153               1.26 triphasic         +---------+------------------+-----+---------+--------+ PTA      127               1.05 triphasic         +---------+------------------+-----+---------+--------+ Great Toe127               1.05 Normal            +---------+------------------+-----+---------+--------+ +---------+------------------+-----+--------+-------+ Left     Lt Pressure (mmHg)IndexWaveformComment +---------+------------------+-----+--------+-------+ Brachial 121                                    +---------+------------------+-----+--------+-------+ ATA      145               1.20 biphasic        +---------+------------------+-----+--------+-------+  PTA      138               1.14 biphasic        +---------+------------------+-----+--------+-------+ Great Toe93                0.77 Normal          +---------+------------------+-----+--------+-------+ +-------+-----------+-----------+------------+---------

## 2021-10-13 ENCOUNTER — Ambulatory Visit: Payer: Medicare Other | Admitting: Internal Medicine

## 2021-10-20 ENCOUNTER — Telehealth (INDEPENDENT_AMBULATORY_CARE_PROVIDER_SITE_OTHER): Payer: Self-pay | Admitting: Nurse Practitioner

## 2021-10-20 NOTE — Telephone Encounter (Signed)
LVM for pt TCB and schedule appt ? ?bilateral saline sclero. no auth req. see FB -  make 3 appts - approx 4 weeks apart.  ?

## 2021-11-12 ENCOUNTER — Ambulatory Visit (INDEPENDENT_AMBULATORY_CARE_PROVIDER_SITE_OTHER): Payer: Medicare Other | Admitting: Nurse Practitioner

## 2021-11-19 ENCOUNTER — Ambulatory Visit (INDEPENDENT_AMBULATORY_CARE_PROVIDER_SITE_OTHER): Payer: Medicare Other | Admitting: Nurse Practitioner

## 2021-11-19 ENCOUNTER — Encounter (INDEPENDENT_AMBULATORY_CARE_PROVIDER_SITE_OTHER): Payer: Self-pay | Admitting: Nurse Practitioner

## 2021-11-19 VITALS — BP 110/64 | HR 103 | Resp 17 | Ht 66.0 in | Wt 134.8 lb

## 2021-11-19 DIAGNOSIS — I83893 Varicose veins of bilateral lower extremities with other complications: Secondary | ICD-10-CM | POA: Diagnosis not present

## 2021-11-27 ENCOUNTER — Encounter (INDEPENDENT_AMBULATORY_CARE_PROVIDER_SITE_OTHER): Payer: Self-pay | Admitting: Nurse Practitioner

## 2021-12-17 ENCOUNTER — Ambulatory Visit (INDEPENDENT_AMBULATORY_CARE_PROVIDER_SITE_OTHER): Payer: Medicare Other | Admitting: Nurse Practitioner

## 2022-01-03 ENCOUNTER — Ambulatory Visit (INDEPENDENT_AMBULATORY_CARE_PROVIDER_SITE_OTHER): Payer: Medicare Other | Admitting: Nurse Practitioner

## 2022-01-03 VITALS — BP 114/66 | HR 100 | Resp 18 | Ht 67.0 in | Wt 139.0 lb

## 2022-01-03 DIAGNOSIS — I83893 Varicose veins of bilateral lower extremities with other complications: Secondary | ICD-10-CM

## 2022-01-03 MED ORDER — CEFDINIR 300 MG PO CAPS: 300.0000 mg | ORAL_CAPSULE | Freq: Two times a day (BID) | ORAL | 0 refills | Status: DC

## 2022-01-15 ENCOUNTER — Encounter (INDEPENDENT_AMBULATORY_CARE_PROVIDER_SITE_OTHER): Payer: Self-pay | Admitting: Nurse Practitioner

## 2022-02-03 ENCOUNTER — Ambulatory Visit (INDEPENDENT_AMBULATORY_CARE_PROVIDER_SITE_OTHER): Payer: Medicare Other | Admitting: Nurse Practitioner

## 2022-02-12 ENCOUNTER — Other Ambulatory Visit: Payer: Self-pay

## 2022-02-12 ENCOUNTER — Emergency Department
Admission: EM | Admit: 2022-02-12 | Discharge: 2022-02-12 | Disposition: A | Discharge status: Home / Self Care | Payer: Medicare Other | Attending: Emergency Medicine | Admitting: Emergency Medicine

## 2022-02-12 DIAGNOSIS — I83892 Varicose veins of left lower extremities with other complications: Secondary | ICD-10-CM | POA: Insufficient documentation

## 2022-02-12 DIAGNOSIS — I83899 Varicose veins of unspecified lower extremities with other complications: Secondary | ICD-10-CM

## 2022-02-12 MED ORDER — MUPIROCIN 2 % EX OINT: 1.0000 | TOPICAL_OINTMENT | Freq: Two times a day (BID) | CUTANEOUS | 0 refills | Status: AC

## 2022-02-12 NOTE — ED Triage Notes (Signed)
Pt with onset of bleeding to varicose vein in left foot approx one hour pta. Bleeding currently controlled. Pt with history of needing cautery in past for same.

## 2022-02-12 NOTE — ED Provider Notes (Signed)
[facility] Provider Note  Patient Contact: 9:06 PM (approximate)   History   No chief complaint on file.   HPI  Louis Sanchez is a 81 y.o. male with a history of thyroid disease, arthritis, cancer under the care of vascular presents to the emergency department with a bleeding varicose vein along the medial aspect of the left foot.  Patient states that he has a history of shallow foot ulcerations and bleeding varicose veins that have required sclerotherapy by vascular in the past.       Physical Exam   Triage Vital Signs: ED Triage Vitals [02/12/22 2029]  Enc Vitals Group     BP 120/61     Pulse Rate 90     Resp 16     Temp 97.8 F (36.6 C)     Temp Source Oral     SpO2 97 %     Weight 135 lb (61.2 kg)     Height 5\' 6"  (1.676 m)     Head Circumference      Peak Flow      Pain Score 0     Pain Loc      Pain Edu?      Excl. in [place]?     Most recent vital signs: Vitals:   02/12/22 2029 02/12/22 2040  BP: 120/61 121/74  Pulse: 90 83  Resp: 16 17  Temp: 97.8 F (36.6 C)   SpO2: 97% 94%     General: Alert and in no acute distress. Eyes:  PERRL. EOMI. Head: No acute traumatic findings ENT:      Nose: No congestion/rhinnorhea.      Mouth/Throat: Mucous membranes are moist. Neck: No stridor. No cervical spine tenderness to palpation. Cardiovascular:  Good peripheral perfusion Respiratory: Normal respiratory effort without tachypnea or retractions. Lungs CTAB. Good air entry to the bases with no decreased or absent breath sounds. Gastrointestinal: Bowel sounds 4 quadrants. Soft and nontender to palpation. No guarding or rigidity. No palpable masses. No distention. No CVA tenderness. Musculoskeletal: Full range of motion to all extremities.  Neurologic:  No gross focal neurologic deficits are appreciated.  Skin: Patient has small varicose vein along the medial aspect of the left foot with associated dried blood.  No active  bleeding.    ED Results / Procedures / Treatments   Labs (all labs ordered are listed, but only abnormal results are displayed) Labs Reviewed - No data to display       PROCEDURES:  Critical Care performed: No  Procedures   MEDICATIONS ORDERED IN ED: Medications - No data to display   IMPRESSION / MDM / ASSESSMENT AND PLAN / ED COURSE  I reviewed the triage vital signs and the nursing notes.                              Assessment and plan Bleeding varicose vein 81 year old male presents to the emergency department with a small bleeding varicose vein along the medial aspect of the left foot.  Bleeding had resolved while waiting in the emergency department.  I did irrigate foot and apply a small amount of Dermabond to bleeding varicose vein site and applied a compression dressing to be removed tomorrow.  Return precautions were given to return with new or worsening symptoms.      FINAL CLINICAL IMPRESSION(S) / ED DIAGNOSES   Final diagnoses:  Bleeding from varicose vein  Rx / DC Orders   ED Discharge Orders          Ordered    mupirocin ointment (BACTROBAN) 2 %  2 times daily        02/12/22 2104             Note:  This document was prepared using Dragon voice recognition software and may include unintentional dictation errors.   Pia Mau [place], Cordelia Poche 02/12/22 2111    Dionne Bucy, MD 02/13/22 [phone]

## 2022-02-12 NOTE — Discharge Instructions (Addendum)
Please apply mupirocin topically to foot ulcerations twice daily for the next 10 days. Please make a follow-up appointment with podiatry to have foot wounds evaluated. Please wear supportive shoes at home. Please remove compression dressing tomorrow.

## 2022-02-18 ENCOUNTER — Other Ambulatory Visit: Payer: Self-pay

## 2022-02-18 ENCOUNTER — Emergency Department: Payer: Medicare Other

## 2022-02-18 ENCOUNTER — Observation Stay
Admission: EM | Admit: 2022-02-18 | Discharge: 2022-02-19 | Disposition: A | Discharge status: Home Health | Payer: Medicare Other | Attending: Internal Medicine | Admitting: Internal Medicine

## 2022-02-18 DIAGNOSIS — I480 Paroxysmal atrial fibrillation: Secondary | ICD-10-CM | POA: Diagnosis not present

## 2022-02-18 DIAGNOSIS — Z8505 Personal history of malignant neoplasm of liver: Secondary | ICD-10-CM | POA: Diagnosis not present

## 2022-02-18 DIAGNOSIS — A419 Sepsis, unspecified organism: Principal | ICD-10-CM | POA: Insufficient documentation

## 2022-02-18 DIAGNOSIS — I83899 Varicose veins of unspecified lower extremities with other complications: Principal | ICD-10-CM | POA: Insufficient documentation

## 2022-02-18 DIAGNOSIS — D649 Anemia, unspecified: Secondary | ICD-10-CM | POA: Insufficient documentation

## 2022-02-18 DIAGNOSIS — L0390 Cellulitis, unspecified: Secondary | ICD-10-CM | POA: Diagnosis not present

## 2022-02-18 DIAGNOSIS — E039 Hypothyroidism, unspecified: Secondary | ICD-10-CM | POA: Insufficient documentation

## 2022-02-18 DIAGNOSIS — I5020 Unspecified systolic (congestive) heart failure: Secondary | ICD-10-CM | POA: Diagnosis not present

## 2022-02-18 DIAGNOSIS — Z20822 Contact with and (suspected) exposure to covid-19: Secondary | ICD-10-CM | POA: Insufficient documentation

## 2022-02-18 DIAGNOSIS — Z87891 Personal history of nicotine dependence: Secondary | ICD-10-CM | POA: Insufficient documentation

## 2022-02-18 DIAGNOSIS — I110 Hypertensive heart disease with heart failure: Secondary | ICD-10-CM | POA: Diagnosis not present

## 2022-02-18 DIAGNOSIS — Z7901 Long term (current) use of anticoagulants: Secondary | ICD-10-CM | POA: Diagnosis not present

## 2022-02-18 DIAGNOSIS — Z79899 Other long term (current) drug therapy: Secondary | ICD-10-CM | POA: Insufficient documentation

## 2022-02-18 DIAGNOSIS — Z7982 Long term (current) use of aspirin: Secondary | ICD-10-CM | POA: Diagnosis not present

## 2022-02-18 DIAGNOSIS — I2510 Atherosclerotic heart disease of native coronary artery without angina pectoris: Secondary | ICD-10-CM | POA: Insufficient documentation

## 2022-02-18 DIAGNOSIS — Z85038 Personal history of other malignant neoplasm of large intestine: Secondary | ICD-10-CM | POA: Insufficient documentation

## 2022-02-18 DIAGNOSIS — Z85118 Personal history of other malignant neoplasm of bronchus and lung: Secondary | ICD-10-CM | POA: Diagnosis not present

## 2022-02-18 DIAGNOSIS — E785 Hyperlipidemia, unspecified: Secondary | ICD-10-CM | POA: Diagnosis present

## 2022-02-18 DIAGNOSIS — M25572 Pain in left ankle and joints of left foot: Secondary | ICD-10-CM | POA: Diagnosis present

## 2022-02-18 DIAGNOSIS — D638 Anemia in other chronic diseases classified elsewhere: Secondary | ICD-10-CM | POA: Diagnosis present

## 2022-02-18 DIAGNOSIS — I10 Essential (primary) hypertension: Secondary | ICD-10-CM | POA: Diagnosis present

## 2022-02-18 DIAGNOSIS — J189 Pneumonia, unspecified organism: Secondary | ICD-10-CM | POA: Diagnosis not present

## 2022-02-18 DIAGNOSIS — K219 Gastro-esophageal reflux disease without esophagitis: Secondary | ICD-10-CM | POA: Diagnosis not present

## 2022-02-18 LAB — BASIC METABOLIC PANEL
Anion gap: 11 (ref 5–15)
Anion gap: 7 (ref 5–15)
BUN: 32 mg/dL — ABNORMAL HIGH (ref 8–23)
BUN: 26 mg/dL — ABNORMAL HIGH (ref 8–23)
CO2: 25 mmol/L (ref 22–32)
CO2: 26 mmol/L (ref 22–32)
Calcium: 8.3 mg/dL — ABNORMAL LOW (ref 8.9–10.3)
Calcium: 7.4 mg/dL — ABNORMAL LOW (ref 8.9–10.3)
Chloride: 102 mmol/L (ref 98–111)
Chloride: 105 mmol/L (ref 98–111)
Creatinine, Ser: 0.93 mg/dL (ref 0.61–1.24)
Creatinine, Ser: 0.76 mg/dL (ref 0.61–1.24)
GFR, Estimated: >60 mL/min (ref >60)
GFR, Estimated: >60 mL/min (ref >60)
Glucose, Bld: 238 mg/dL — ABNORMAL HIGH (ref 70–99)
Glucose, Bld: 164 mg/dL — ABNORMAL HIGH (ref 70–99)
Potassium: 4.4 mmol/L (ref 3.5–5.1)
Potassium: 4.6 mmol/L (ref 3.5–5.1)
Sodium: 138 mmol/L (ref 135–145)
Sodium: 138 mmol/L (ref 135–145)

## 2022-02-18 LAB — CBC WITH DIFFERENTIAL/PLATELET
Abs Immature Granulocytes: 0.13 10*3/uL — ABNORMAL HIGH (ref 0.00–0.07)
Basophils Absolute: 0.1 10*3/uL (ref 0.0–0.1)
Basophils Relative: 0 %
Eosinophils Absolute: 0 10*3/uL (ref 0.0–0.5)
Eosinophils Relative: 0 %
HCT: 26.1 % — ABNORMAL LOW (ref 39.0–52.0)
Hemoglobin: 7.8 g/dL — ABNORMAL LOW (ref 13.0–17.0)
Immature Granulocytes: 1 %
Lymphocytes Relative: 4 %
Lymphs Abs: 0.8 10*3/uL (ref 0.7–4.0)
MCH: 27.1 pg (ref 26.0–34.0)
MCHC: 29.9 g/dL — ABNORMAL LOW (ref 30.0–36.0)
MCV: 90.6 fL (ref 80.0–100.0)
Monocytes Absolute: 1.3 10*3/uL — ABNORMAL HIGH (ref 0.1–1.0)
Monocytes Relative: 7 %
Neutro Abs: 15.9 10*3/uL — ABNORMAL HIGH (ref 1.7–7.7)
Neutrophils Relative %: 88 %
Platelets: 305 10*3/uL (ref 150–400)
RBC: 2.88 MIL/uL — ABNORMAL LOW (ref 4.22–5.81)
RDW: 17.3 % — ABNORMAL HIGH (ref 11.5–15.5)
WBC: 18.2 10*3/uL — ABNORMAL HIGH (ref 4.0–10.5)
nRBC: 0 % (ref 0.0–0.2)

## 2022-02-18 LAB — LACTIC ACID, PLASMA: Lactic Acid, Venous: 1.3 mmol/L (ref 0.5–1.9)

## 2022-02-18 LAB — HEPATIC FUNCTION PANEL
ALT: 13 U/L (ref 0–44)
AST: 25 U/L (ref 15–41)
Albumin: 2.2 g/dL — ABNORMAL LOW (ref 3.5–5.0)
Alkaline Phosphatase: 60 U/L (ref 38–126)
Bilirubin, Direct: 0.1 mg/dL (ref 0.0–0.2)
Indirect Bilirubin: 0.4 mg/dL (ref 0.3–0.9)
Total Bilirubin: 0.5 mg/dL (ref 0.3–1.2)
Total Protein: 5.6 g/dL — ABNORMAL LOW (ref 6.5–8.1)

## 2022-02-18 LAB — CBC
HCT: 21.3 % — ABNORMAL LOW (ref 39.0–52.0)
Hemoglobin: 6.7 g/dL — ABNORMAL LOW (ref 13.0–17.0)
MCH: 27.5 pg (ref 26.0–34.0)
MCHC: 31.5 g/dL (ref 30.0–36.0)
MCV: 87.3 fL (ref 80.0–100.0)
Platelets: 173 10*3/uL (ref 150–400)
RBC: 2.44 MIL/uL — ABNORMAL LOW (ref 4.22–5.81)
RDW: 16.9 % — ABNORMAL HIGH (ref 11.5–15.5)
WBC: 12.5 10*3/uL — ABNORMAL HIGH (ref 4.0–10.5)
nRBC: 0 % (ref 0.0–0.2)

## 2022-02-18 LAB — PROCALCITONIN: Procalcitonin: 0.59 ng/mL

## 2022-02-18 LAB — PROTIME-INR
INR: 2.7 — ABNORMAL HIGH (ref 0.8–1.2)
Prothrombin Time: 28.4 seconds — ABNORMAL HIGH (ref 11.4–15.2)

## 2022-02-18 LAB — HEMOGLOBIN AND HEMATOCRIT, BLOOD
HCT: 25.1 % — ABNORMAL LOW (ref 39.0–52.0)
Hemoglobin: 7.8 g/dL — ABNORMAL LOW (ref 13.0–17.0)

## 2022-02-18 LAB — TROPONIN I (HIGH SENSITIVITY): Troponin I (High Sensitivity): 16 ng/L (ref <18)

## 2022-02-18 LAB — PREPARE RBC (CROSSMATCH)

## 2022-02-18 LAB — SARS CORONAVIRUS 2 BY RT PCR: SARS Coronavirus 2 by RT PCR: NEGATIVE

## 2022-02-18 LAB — ABO/RH: ABO/RH(D): O NEG

## 2022-02-18 MED ORDER — ACETAMINOPHEN 325 MG PO TABS: 650.0000 mg | ORAL_TABLET | Freq: Four times a day (QID) | ORAL | Status: DC | PRN

## 2022-02-18 MED ORDER — TRANEXAMIC ACID FOR EPISTAXIS
500.0000 mg | Freq: Once | TOPICAL | Status: AC
  Administered 2022-02-18: 500 mg via TOPICAL
  Filled 2022-02-18: qty 10

## 2022-02-18 MED ORDER — GABAPENTIN 300 MG PO CAPS
300.0000 mg | ORAL_CAPSULE | Freq: Three times a day (TID) | ORAL | Status: DC
  Administered 2022-02-18 – 2022-02-19 (×4): 300 mg via ORAL
  Filled 2022-02-18 (×4): qty 1

## 2022-02-18 MED ORDER — SODIUM CHLORIDE 0.9 % IV SOLN: INTRAVENOUS | Status: DC

## 2022-02-18 MED ORDER — LOSARTAN POTASSIUM 25 MG PO TABS
12.5000 mg | ORAL_TABLET | Freq: Every day | ORAL | Status: DC
  Administered 2022-02-19: 12.5 mg via ORAL
  Filled 2022-02-18: qty 1
  Filled 2022-02-18: qty 0.5

## 2022-02-18 MED ORDER — SODIUM CHLORIDE 0.9 % IV BOLUS (SEPSIS)
1000.0000 mL | Freq: Once | INTRAVENOUS | Status: AC
  Administered 2022-02-18: 1000 mL via INTRAVENOUS

## 2022-02-18 MED ORDER — SODIUM CHLORIDE 0.9 % IV SOLN
500.0000 mg | INTRAVENOUS | Status: DC
  Administered 2022-02-19: 500 mg via INTRAVENOUS
  Filled 2022-02-18: qty 500

## 2022-02-18 MED ORDER — ONDANSETRON HCL 4 MG PO TABS: 4.0000 mg | ORAL_TABLET | Freq: Four times a day (QID) | ORAL | Status: DC | PRN

## 2022-02-18 MED ORDER — GUAIFENESIN ER 600 MG PO TB12
600.0000 mg | ORAL_TABLET | Freq: Two times a day (BID) | ORAL | Status: DC
  Administered 2022-02-18 – 2022-02-19 (×3): 600 mg via ORAL
  Filled 2022-02-18 (×3): qty 1

## 2022-02-18 MED ORDER — ACETAMINOPHEN 650 MG RE SUPP: 650.0000 mg | Freq: Four times a day (QID) | RECTAL | Status: DC | PRN

## 2022-02-18 MED ORDER — SODIUM CHLORIDE 0.9 % IV SOLN: Freq: Once | INTRAVENOUS | Status: AC

## 2022-02-18 MED ORDER — LEVOTHYROXINE SODIUM 50 MCG PO TABS
175.0000 ug | ORAL_TABLET | Freq: Every day | ORAL | Status: DC
  Administered 2022-02-19: 175 ug via ORAL
  Filled 2022-02-18: qty 1

## 2022-02-18 MED ORDER — ONDANSETRON HCL 4 MG/2ML IJ SOLN
4.0000 mg | Freq: Four times a day (QID) | INTRAMUSCULAR | Status: DC | PRN
  Administered 2022-02-18: 4 mg via INTRAVENOUS
  Filled 2022-02-18: qty 2

## 2022-02-18 MED ORDER — SODIUM CHLORIDE 0.9 % IV SOLN
2.0000 g | Freq: Once | INTRAVENOUS | Status: AC
  Administered 2022-02-18: 2 g via INTRAVENOUS
  Filled 2022-02-18: qty 20

## 2022-02-18 MED ORDER — MUPIROCIN 2 % EX OINT
1.0000 | TOPICAL_OINTMENT | Freq: Two times a day (BID) | CUTANEOUS | Status: DC
Start: 2022-02-18 — End: 2022-02-19
  Administered 2022-02-18 – 2022-02-19 (×3): 1 via TOPICAL
  Filled 2022-02-18 (×2): qty 22

## 2022-02-18 MED ORDER — SODIUM CHLORIDE 0.9 % IV SOLN: 10.0000 mL/h | Freq: Once | INTRAVENOUS | Status: DC

## 2022-02-18 MED ORDER — TIOTROPIUM BROMIDE MONOHYDRATE 18 MCG IN CAPS
18.0000 ug | ORAL_CAPSULE | Freq: Every day | RESPIRATORY_TRACT | Status: DC
Start: 2022-02-19 — End: 2022-02-19
  Administered 2022-02-19: 18 ug via RESPIRATORY_TRACT

## 2022-02-18 MED ORDER — AZITHROMYCIN 500 MG IV SOLR
500.0000 mg | Freq: Once | INTRAVENOUS | Status: AC
  Administered 2022-02-18: 500 mg via INTRAVENOUS
  Filled 2022-02-18: qty 5

## 2022-02-18 MED ORDER — NITROGLYCERIN 0.4 MG SL SUBL: 0.4000 mg | SUBLINGUAL_TABLET | SUBLINGUAL | Status: DC | PRN

## 2022-02-18 MED ORDER — TRAZODONE HCL 50 MG PO TABS: 25.0000 mg | ORAL_TABLET | Freq: Every evening | ORAL | Status: DC | PRN

## 2022-02-18 MED ORDER — ADULT MULTIVITAMIN W/MINERALS CH
1.0000 | ORAL_TABLET | Freq: Every day | ORAL | Status: DC
  Administered 2022-02-18 – 2022-02-19 (×2): 1 via ORAL
  Filled 2022-02-18 (×2): qty 1

## 2022-02-18 MED ORDER — MAGNESIUM HYDROXIDE 400 MG/5ML PO SUSP: 30.0000 mL | Freq: Every day | ORAL | Status: DC | PRN

## 2022-02-18 MED ORDER — FERROUS SULFATE 325 (65 FE) MG PO TABS
325.0000 mg | ORAL_TABLET | Freq: Every day | ORAL | Status: DC
  Administered 2022-02-18 – 2022-02-19 (×2): 325 mg via ORAL
  Filled 2022-02-18 (×2): qty 1

## 2022-02-18 MED ORDER — SERTRALINE HCL 50 MG PO TABS
100.0000 mg | ORAL_TABLET | Freq: Every day | ORAL | Status: DC
  Administered 2022-02-18 – 2022-02-19 (×2): 100 mg via ORAL
  Filled 2022-02-18 (×2): qty 2

## 2022-02-18 NOTE — Assessment & Plan Note (Addendum)
Blood pressure within goal. -Continue home dose of losartan

## 2022-02-18 NOTE — ED Provider Notes (Signed)
[facility] Provider Note    Event Date/Time   First MD Initiated Contact with Patient 02/18/22 0136     (approximate)   History   Foot Pain   HPI  Louis Sanchez is a 81 y.o. male with history of A-fib on Xarelto, hypothyroidism, CAD who presents to the emergency department with complaints of bleeding from a varicose vein on the left foot.  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  Was just seen several days ago for bleeding from the left foot in the ED.  States bleeding started again tonight he soaked through several towels.  He is now feeling short of breath and lightheaded.  No chest pain.  Bleeding has now stopped after pressure bandage applied by EMS.  He denies any injury to the foot.   History provided by patient and EMS.    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 Authorizing Provider  acetaminophen (TYLENOL) 650 MG CR tablet Take 650 mg by mouth every 8 (eight) hours as needed for pain.    [provider]  aspirin EC 81 MG tablet Take 81 mg by mouth. Patient not taking: Reported on 09/24/2021    [provider]  atenolol (TENORMIN) 25 MG tablet Take 0.5  tablets by mouth every evening. Patient not taking: Reported on 09/24/2021 01/21/13   [provider]  B Complex Vitamins (VITAMIN B COMPLEX PO) Take by mouth. Patient not taking: Reported on 09/24/2021    [provider]  B Complex-C (B-COMPLEX WITH VITAMIN C) tablet Take 1 tablet by mouth daily. Patient not taking: Reported on 09/24/2021    [provider]  cefdinir (OMNICEF) 300 MG capsule Take 1 capsule (300 mg total) by mouth 2 (two) times daily. 01/03/22   Kris Hartmann, NP  Cholecalciferol (VITAMIN D3) 1000 units CAPS Take by mouth. Patient not taking: Reported on 09/24/2021    [provider]  Ferrous Sulfate (IRON PO) Take by mouth.    [provider]  fish oil-omega-3 fatty acids 1000 MG capsule Take 2 g by mouth daily. Patient not taking: Reported on 09/24/2021    [provider]  fluocinonide cream (LIDEX) 0.05 % Apply topically. Patient not taking: Reported on 09/24/2021 08/12/15   [provider]  furosemide (LASIX) 40 MG tablet Take 20 mg by mouth daily. 11/23/20   [provider]  gabapentin (NEURONTIN) 300 MG capsule Take 600 mg by mouth 3 (three) times daily.    [provider]  glucosamine-chondroitin 500-400 MG tablet Take 1 tablet by mouth  3 (three) times daily. Patient not taking: Reported on 09/24/2021    [provider]  levothyroxine (SYNTHROID) 175 MCG tablet Take 175 mcg by mouth daily. Patient not taking: Reported on 09/24/2021 12/30/20   [provider]  levothyroxine (SYNTHROID) 175 MCG tablet Take by mouth. 12/30/20 12/30/21  [provider]  losartan (COZAAR) 25 MG tablet Take 12.5 mg by mouth daily. 11/16/20   [provider]  metoprolol succinate (TOPROL-XL) 25 MG 24 hr tablet Take 1 tablet by mouth daily. Patient not taking: Reported on 01/03/2022 10/02/20   [provider]  Multiple Vitamin (MULTIVITAMIN) tablet Take 1 tablet by mouth daily.    [provider]  mupirocin ointment (BACTROBAN) 2 % Apply 1 Application topically 2 (two) times daily for 7 days. Apply twice daily for seven days. 02/12/22 02/19/22  Lannie Fields, PA-C  nitroGLYCERIN (NITROSTAT) 0.4 MG SL tablet Place under the tongue. 09/01/20 09/24/21  [provider]  nystatin (MYCOSTATIN) 100000 UNIT/ML suspension Take 5 mLs by mouth 4 (four) times daily. 12/12/20   [provider]  NYSTATIN powder Apply topically as needed. 10/19/20   [provider]  ondansetron (ZOFRAN) 4 MG tablet Take 1 tablet (4 mg total) by mouth every 6 (six) hours as needed for nausea. Patient not taking: Reported on 09/24/2021 09/13/16   Theodoro Grist, MD  pantoprazole (PROTONIX) 40 MG tablet Take 1 tablet (40 mg total) by mouth daily. Patient not taking: Reported on 09/24/2021 09/14/16   Theodoro Grist, MD  potassium chloride (KLOR-CON) 10 MEQ tablet Take 10 mEq by mouth as needed. 08/26/20 09/24/21  [provider]  pravastatin (PRAVACHOL) 20 MG tablet Take 20 mg by mouth. Patient not taking: Reported on 09/24/2021 09/10/14   [provider]  rivaroxaban (XARELTO) 20 MG TABS tablet Take 20 mg by mouth daily with supper.    [provider]  rosuvastatin (CRESTOR) 10 MG tablet Take 10 mg by mouth daily. 07/28/16   [provider]  sertraline (ZOLOFT) 100 MG tablet Take 1 tablet by mouth daily. 01/21/13   [provider]  silver sulfADIAZINE (SILVADENE) 1 % cream Apply 1 application topically daily. 09/24/21   Kris Hartmann, NP  spironolactone (ALDACTONE) 50 MG tablet Take 1 tablet (50 mg total) by mouth 2 (two) times daily. Patient not taking: Reported on 09/24/2021 09/13/16   Theodoro Grist, MD  sucralfate (CARAFATE) 1 g tablet Take 1 tablet (1 g total) by mouth 4 (four) times daily -  with meals and at bedtime. Patient not taking: Reported on 09/24/2021 09/13/16   Theodoro Grist, MD  SYNTHROID 50 MCG tablet Take 200 mcg by mouth daily.  Patient not  taking: Reported on 09/24/2021 01/22/13   [provider]  tretinoin (RETIN-A) 0.025 % cream  03/14/12   [provider]  valACYclovir (VALTREX) 1000 MG tablet Take 1,000 mg by mouth. Patient not taking: Reported on 09/24/2021 05/26/11   [provider]  vitamin C (ASCORBIC ACID) 500 MG tablet Take 500 mg by mouth daily. Patient not taking: Reported on 09/24/2021    [provider]  vitamin E 100 UNIT capsule Take 400 Units by mouth daily. Patient not taking: Reported on 09/24/2021    [provider]    Physical Exam   Triage Vital Signs: ED Triage Vitals [02/18/22 0024]  Enc Vitals Group     BP (!) 92/47     Pulse Rate (!) 111     Resp 20  Temp 97.9 F (36.6 C)     Temp Source Oral     SpO2 100 %     Weight      Height      Head Circumference      Peak Flow      Pain Score      Pain Loc      Pain Edu?      Excl. in [place]?     Most recent vital signs: Vitals:   02/18/22 0405 02/18/22 0425  BP: (!) 96/57 100/62  Pulse: 90 92  Resp: (!) 24 19  Temp:    SpO2:      CONSTITUTIONAL: Alert and oriented and responds appropriately to questions.  Elderly, pale in appearance HEAD: Normocephalic, atraumatic EYES: Conjunctivae clear, pupils appear equal, sclera nonicteric, patient has conjunctival pallor ENT: normal nose; moist mucous membranes NECK: Supple, normal ROM CARD: Irregularly irregular and tachycardic; S1 and S2 appreciated; no murmurs, no clicks, no rubs, no gallops RESP: Normal chest excursion without splinting or tachypnea; breath sounds clear and equal bilaterally; no wheezes, no rhonchi, no rales, no hypoxia or respiratory distress, speaking full sentences ABD/GI: Normal bowel sounds; non-distended; soft, non-tender, no rebound, no guarding, no peritoneal signs BACK: The back appears normal EXT: Normal ROM in all joints; no deformity noted, no edema; no cyanosis, 2+ left DP pulse, small varicose vein to the medial left foot that  is not actively bleeding with no surrounding redness, warmth, induration or fluctuance, no calf tenderness or calf swelling SKIN: Normal color for age and race; warm; no rash on exposed skin NEURO: Moves all extremities equally, normal speech PSYCH: The patient's mood and manner are appropriate.   ED Results / Procedures / Treatments   LABS: (all labs ordered are listed, but only abnormal results are displayed) Labs Reviewed  CBC WITH DIFFERENTIAL/PLATELET - Abnormal; Notable for the following components:      Result Value   WBC 18.2 (*)    RBC 2.88 (*)    Hemoglobin 7.8 (*)    HCT 26.1 (*)    MCHC 29.9 (*)    RDW 17.3 (*)    Neutro Abs 15.9 (*)    Monocytes Absolute 1.3 (*)    Abs Immature Granulocytes 0.13 (*)    All other components within normal limits  BASIC METABOLIC PANEL - Abnormal; Notable for the following components:   Glucose, Bld 238 (*)    BUN 32 (*)    Calcium 8.3 (*)    All other components within normal limits  PROTIME-INR - Abnormal; Notable for the following components:   Prothrombin Time 28.4 (*)    INR 2.7 (*)    All other components within normal limits  CULTURE, BLOOD (ROUTINE X 2)  CULTURE, BLOOD (ROUTINE X 2)  URINE CULTURE  SARS CORONAVIRUS 2 BY RT PCR  HEMOGLOBIN AND HEMATOCRIT, BLOOD  HEMOGLOBIN AND HEMATOCRIT, BLOOD  PROCALCITONIN  URINALYSIS, ROUTINE W REFLEX MICROSCOPIC  LACTIC ACID, PLASMA  LACTIC ACID, PLASMA  BASIC METABOLIC PANEL  CBC  TYPE AND SCREEN  PREPARE RBC (CROSSMATCH)  TYPE AND SCREEN  ABO/RH  TYPE AND SCREEN  TROPONIN I (HIGH SENSITIVITY)     EKG:  EKG Interpretation  Date/Time:  Friday February 18 2022 01:51:15 EDT Ventricular Rate:  98 PR Interval:    QRS Duration: 80 QT Interval:  346 QTC Calculation: 442 R Axis:   106 Text Interpretation: Atrial fibrillation Right axis deviation Borderline T wave abnormalities Confirmed by Chalisa Kobler, Cyril Mourning [phone]) on  02/18/2022 1:59:04 AM         RADIOLOGY: My personal  review and interpretation of imaging: Chest x-ray shows possible left pneumonia.  I have personally reviewed all radiology reports.   DG Chest Portable 1 View  Result Date: 02/18/2022 CLINICAL DATA:  Left foot pain. EXAM: PORTABLE CHEST 1 VIEW COMPARISON:  October 02, 2021 FINDINGS: There is stable mild to moderate severity enlargement of the cardiac silhouette. Stable scarring and/or atelectasis is seen within the right lung base with stable right-sided volume loss. Mild infiltrate is seen within the left lung base. There is a small right pleural effusion. No pneumothorax is identified. A radiopaque fusion plate and screws are seen overlying the lower cervical spine. IMPRESSION: 1. Stable right basilar scarring and/or atelectasis with right-sided volume loss. 2. Mild left basilar infiltrate. 3. Small right pleural effusion. Electronically Signed   By: Virgina Norfolk M.D.   On: 02/18/2022 03:24     PROCEDURES:  Critical Care performed: Yes, see critical care procedure note(s)   CRITICAL CARE Performed by: Cyril Mourning Ladarion Munyon   Total critical care time: 45 minutes  Critical care time was exclusive of separately billable procedures and treating other patients.  Critical care was necessary to treat or prevent imminent or life-threatening deterioration.  Critical care was time spent personally by me on the following activities: development of treatment plan with patient and/or surrogate as well as nursing, discussions with consultants, evaluation of patient's response to treatment, examination of patient, obtaining history from patient or surrogate, ordering and performing treatments and interventions, ordering and review of laboratory studies, ordering and review of radiographic studies, pulse oximetry and re-evaluation of patient's condition.   Marland Kitchen1-3 Lead EKG Interpretation  Performed by: Deondrea Markos, Delice Bison, DO Authorized by: Farhana Fellows, Delice Bison, DO     Interpretation: abnormal     ECG rate:  111    ECG rate assessment: tachycardic     Rhythm: atrial fibrillation     Ectopy: none     Conduction: normal       IMPRESSION / MDM / ASSESSMENT AND PLAN / ED COURSE  I reviewed the triage vital signs and the nursing notes.    Patient here with bleeding from his left foot now with shortness of breath and dizziness.  The patient is on the cardiac monitor to evaluate for evidence of arrhythmia and/or significant heart rate changes.   DIFFERENTIAL DIAGNOSIS (includes but not limited to):   Bleeding from varicose vein, no sign of cellulitis, likely symptomatic anemia, differential also includes ACS, CHF, PE   Patient's presentation is most consistent with acute presentation with potential threat to life or bodily function.   PLAN: We will obtain CBC, BMP, troponin, EKG, chest x-ray, type and screen, INR.  Wound is not actively bleeding but will apply TXA soaked gauze and monitor closely.  He is tachycardic and slightly hypotensive today.  Denies any infectious symptoms.  Afebrile.   MEDICATIONS GIVEN IN ED: Medications  0.9 %  sodium chloride infusion (has no administration in time range)  losartan (COZAAR) tablet 12.5 mg (has no administration in time range)  nitroGLYCERIN (NITROSTAT) SL tablet 0.4 mg (has no administration in time range)  sertraline (ZOLOFT) tablet 100 mg (has no administration in time range)  levothyroxine (SYNTHROID) tablet 175 mcg (has no administration in time range)  ferrous sulfate tablet 325 mg (has no administration in time range)  gabapentin (NEURONTIN) capsule 300 mg (has no administration in time range)  multivitamin with minerals tablet 1 tablet (  has no administration in time range)  tiotropium (SPIRIVA) inhalation capsule ([facility] use ONLY) 18 mcg (has no administration in time range)  mupirocin ointment (BACTROBAN) 2 % 1 Application (has no administration in time range)  0.9 %  sodium chloride infusion (has no administration in time range)  acetaminophen  (TYLENOL) tablet 650 mg (has no administration in time range)    Or  acetaminophen (TYLENOL) suppository 650 mg (has no administration in time range)  traZODone (DESYREL) tablet 25 mg (has no administration in time range)  magnesium hydroxide (MILK OF MAGNESIA) suspension 30 mL (has no administration in time range)  ondansetron (ZOFRAN) tablet 4 mg (has no administration in time range)    Or  ondansetron (ZOFRAN) injection 4 mg (has no administration in time range)  azithromycin (ZITHROMAX) 500 mg in sodium chloride 0.9 % 250 mL IVPB (has no administration in time range)  cefTRIAXone (ROCEPHIN) 2 g in sodium chloride 0.9 % 100 mL IVPB (has no administration in time range)  sodium chloride 0.9 % bolus 1,000 mL (has no administration in time range)  sodium chloride 0.9 % bolus 1,000 mL (1,000 mLs Intravenous New Bag/Given 02/18/22 0440)  tranexamic acid (CYKLOKAPRON) 1000 MG/10ML topical solution 500 mg (500 mg Topical Given 02/18/22 0300)     ED COURSE: Patient's labs show leukocytosis of 18,000 which may be reactive.  His hemoglobin is down to 7.8 and was 10.1 in March.  INR is 2.7.  Platelet count normal.  Will transfuse 1 unit of packed red blood cells for symptomatic anemia.  Troponin negative.  Chest x-ray reviewed/interpreted by myself radiologist and shows a possible left lower lobe pneumonia.  He denies any infectious symptoms but given this leukocytosis of 18,000, tachycardia, hypotension, sepsis is also on the differential.  Discussed this with the hospitalist.  Will obtain blood cultures, lactic as well.  We will give Rocephin, azithromycin for coverage for community-acquired pneumonia.    Procalcitonin has come back elevated at 0.59.  We will continue antibiotics for lower respiratory infection.    CONSULTS:  Consulted and discussed patient's case with hospitalist, Dr. Sidney Ace.  I have recommended admission and consulting physician agrees and will place admission orders.  Patient (and  family if present) agree with this plan.   I reviewed all nursing notes, vitals, pertinent previous records.  All labs, EKGs, imaging ordered have been independently reviewed and interpreted by myself.    OUTSIDE RECORDS REVIEWED: Reviewed patient's internal medicine note with Dr. Caryl Comes yesterday.       FINAL CLINICAL IMPRESSION(S) / ED DIAGNOSES   Final diagnoses:  Bleeding from varicose vein  Symptomatic anemia  Pneumonia of left lower lobe due to infectious organism     Rx / DC Orders   ED Discharge Orders     None        Note:  This document was prepared using Dragon voice recognition software and may include unintentional dictation errors.   Cleotha Tsang, Delice Bison, DO 02/18/22 [phone]

## 2022-02-18 NOTE — Progress Notes (Signed)
Admission profile updated. ?

## 2022-02-18 NOTE — Consult Note (Signed)
[facility] Consult Note  MRN : [id number]  Louis Sanchez is a 81 y.o. (03-09-1941) male who presents with chief complaint of  Chief Complaint  Patient presents with   Foot Pain  .   Consulting Physician:Sumayya Reesa Chew, MD Reason for consult: Bleeding varicosities with wound History of Present Illness: Louis Sanchez is a 81 year old male with a past medical history significant for varicose veins, osteoarthritis, hypertension, atrial fibrillation, systolic heart failure, peripheral neuropathy and dyslipidemia that presented to [facility] due to left foot pain with swelling and bleeding from his varicose veins.  At that time he denied any nausea or vomiting or abdominal pain.  He had no chest pain or shortness of breath.  The patient is well-known to our practice.  The patient was also seen for bleeding varicosities and open wounds.  At the last time the patient was seen the wounds were improved and he underwent sclerotherapy.  The patient had several sclerotherapy appointments that were missed by the patient.  The purpose of sclerotherapy was to treat his varicosities at have recurrent bleeding.  Current Facility-Administered Medications  Medication Dose Route Frequency Provider Last Rate Last Admin   0.9 %  sodium chloride infusion  10 mL/hr Intravenous Once Ward, Kristen N, DO       acetaminophen (TYLENOL) tablet 650 mg  650 mg Oral Q6H PRN Mansy, Jan A, MD       Or   acetaminophen (TYLENOL) suppository 650 mg  650 mg Rectal Q6H PRN Mansy, Arvella Merles, MD       [START ON 02/19/2022] azithromycin (ZITHROMAX) 500 mg in sodium chloride 0.9 % 250 mL IVPB  500 mg Intravenous Q24H Mansy, Jan A, MD       ferrous sulfate tablet 325 mg  325 mg Oral Daily Mansy, Jan A, MD   325 mg at 02/18/22 1016   gabapentin (NEURONTIN) capsule 300 mg  300 mg Oral TID Mansy, Jan A, MD   300 mg at 02/18/22 2058   guaiFENesin (MUCINEX) 12 hr tablet 600 mg  600 mg Oral BID  Mansy, Jan A, MD   600 mg at 02/18/22 2058   levothyroxine (SYNTHROID) tablet 175 mcg  175 mcg Oral Q0600 Mansy, Jan A, MD       losartan (COZAAR) tablet 12.5 mg  12.5 mg Oral Daily Mansy, Jan A, MD       magnesium hydroxide (MILK OF MAGNESIA) suspension 30 mL  30 mL Oral Daily PRN Mansy, Jan A, MD       multivitamin with minerals tablet 1 tablet  1 tablet Oral Daily Mansy, Jan A, MD   1 tablet at 02/18/22 1016   mupirocin ointment (BACTROBAN) 2 % 1 Application  1 Application Topical BID Mansy, Jan A, MD   1 Application at 40/34/74 2058   nitroGLYCERIN (NITROSTAT) SL tablet 0.4 mg  0.4 mg Sublingual Q5 min PRN Mansy, Jan A, MD       ondansetron [facility]) tablet 4 mg  4 mg Oral Q6H PRN Mansy, Jan A, MD       Or   ondansetron [facility]) injection 4 mg  4 mg Intravenous Q6H PRN Mansy, Jan A, MD   4 mg at 02/18/22 2595   sertraline (ZOLOFT) tablet 100 mg  100 mg Oral Daily Mansy, Jan A, MD   100 mg at 02/18/22 1016   [START ON 02/19/2022] tiotropium (SPIRIVA) inhalation capsule ([facility] use ONLY) 18 mcg  18 mcg Inhalation Daily Mansy,  Arvella Merles, MD       traZODone (DESYREL) tablet 25 mg  25 mg Oral QHS PRN Mansy, Arvella Merles, MD        Past Medical History:  Diagnosis Date   Arthritis    Benign neoplasm of colon 2004   Benign neoplasm of colon 04/04/2008   51mm tubular adenoma of the ascending colon   Cancer ([place])    liver and lungs   Heart disease    Other and unspecified hyperlipidemia 1997   Other specified disorder of male genital organs(608.89) 214   Sleep disturbance, unspecified    Thyroid disease    Unspecified hypothyroidism 2005    Past Surgical History:  Procedure Laterality Date   APPENDECTOMY  1953   colon polyps  2009   [place]   Right   INGUINAL HERNIA REPAIR Right 2014   SPINE SURGERY  1997   neck   WRIST SURGERY      Social History Social History   Tobacco Use   Smoking status: Former    Years: 25.00    Types: Cigarettes   Smokeless tobacco: Never  Substance  Use Topics   Alcohol use: Yes    Alcohol/week: 15.0 standard drinks of alcohol    Types: 15 Standard drinks or equivalent per week   Drug use: No    Family History Family History  Problem Relation Age of Onset   Alzheimer's disease Mother     Allergies  Allergen Reactions   Atorvastatin     Other reaction(s): Muscle Pain Other reaction(s): Muscle Pain Other reaction(s): Muscle Pain Other reaction(s): Muscle Pain Other reaction(s): Muscle Pain    Levofloxacin Rash     REVIEW OF SYSTEMS (Negative unless checked)  Constitutional: [] Weight loss  [] Fever  [] Chills Cardiac: [] Chest pain   [] Chest pressure   [] Palpitations   [] Shortness of breath when laying flat   [] Shortness of breath at rest   [] Shortness of breath with exertion. Vascular:  [] Pain in legs with walking   [] Pain in legs at rest   [] Pain in legs when laying flat   [] Claudication   [] Pain in feet when walking  [] Pain in feet at rest  [] Pain in feet when laying flat   [] History of DVT   [] Phlebitis   [] Swelling in legs   [] Varicose veins   [x] Non-healing ulcers Pulmonary:   [] Uses home oxygen   [] Productive cough   [] Hemoptysis   [] Wheeze  [] COPD   [] Asthma Neurologic:  [] Dizziness  [] Blackouts   [] Seizures   [] History of stroke   [] History of TIA  [] Aphasia   [] Temporary blindness   [] Dysphagia   [] Weakness or numbness in arms   [] Weakness or numbness in legs Musculoskeletal:  [] Arthritis   [] Joint swelling   [] Joint pain   [] Low back pain Hematologic:  [] Easy bruising  [x] Easy bleeding   [] Hypercoagulable state   [] Anemic  [] Hepatitis Gastrointestinal:  [] Blood in stool   [] Vomiting blood  [] Gastroesophageal reflux/heartburn   [] Difficulty swallowing. Genitourinary:  [] Chronic kidney disease   [] Difficult urination  [] Frequent urination  [] Burning with urination   [] Blood in urine Skin:  [] Rashes   [] Ulcers   [] Wounds Psychological:  [] History of anxiety   []  History of major depression.  Physical  Examination  Vitals:   02/18/22 1648 02/18/22 1712 02/18/22 1800 02/18/22 2021  BP: (!) 112/59  114/69 (!) 101/57  Pulse: (!) 104  90 96  Resp: 18  17 18   Temp: 98.4 F (36.9 C)  98.7 F (37.1 C) 99.4 F (37.4 C)  TempSrc: Oral  Oral   SpO2: 99%  99% 96%  Weight:  56.3 kg    Height:  5\' 6"  (1.676 m)     Body mass index is 20.03 kg/m. Gen:  WD/WN, NAD Head: [place]/AT, No temporalis wasting. Prominent temp pulse not noted. Ear/Nose/Throat: Hearing grossly intact, nares w/o erythema or drainage, oropharynx w/o Erythema/Exudate Eyes: Sclera non-icteric, conjunctiva clear Neck: Trachea midline.  No JVD.  Pulmonary:  Good air movement, respirations not labored, equal bilaterally.  Cardiac: RRR, normal S1, S2. Vascular: Nonpalpable pulses with 1+ edema bilaterally previous bleeding from varicosities  Gastrointestinal: soft, non-tender/non-distended. No guarding/reflex.  Musculoskeletal: M/S 5/5 throughout.  Extremities without ischemic changes.  No deformity or atrophy. Neurologic: Sensation grossly intact in extremities.  Symmetrical.  Speech is fluent. Motor exam as listed above. Psychiatric: Judgment intact, Mood & affect appropriate for pt's clinical situation. Dermatologic: No rashes or ulcers noted.  No cellulitis or open wounds. Lymph : No Cervical, Axillary, or Inguinal lymphadenopathy.    CBC Lab Results  Component Value Date   WBC 12.5 (H) 02/18/2022   HGB 7.8 (L) 02/18/2022   HCT 25.1 (L) 02/18/2022   MCV 87.3 02/18/2022   PLT 173 02/18/2022    BMET    Component Value Date/Time   NA 138 02/18/2022 1013   NA 139 01/30/2014 1705   K 4.6 02/18/2022 1013   K 4.4 01/30/2014 1705   CL 105 02/18/2022 1013   CL 102 01/30/2014 1705   CO2 26 02/18/2022 1013   CO2 29 01/30/2014 1705   GLUCOSE 164 (H) 02/18/2022 1013   GLUCOSE 109 (H) 01/30/2014 1705   BUN 26 (H) 02/18/2022 1013   BUN 21 (H) 01/30/2014 1705   CREATININE 0.76 02/18/2022 1013   CREATININE 1.03  01/30/2014 1705   CALCIUM 7.4 (L) 02/18/2022 1013   CALCIUM 9.2 01/30/2014 1705   GFRNONAA >60 02/18/2022 1013   GFRNONAA >60 01/30/2014 1705   GFRAA >60 09/12/2016 0416   GFRAA >60 01/30/2014 1705   Estimated Creatinine Clearance: 59.6 mL/min (by C-G formula based on SCr of 0.76 mg/dL).  COAG Lab Results  Component Value Date   INR 2.7 (H) 02/18/2022   INR 1.52 09/11/2016    Radiology DG Chest Portable 1 View  Result Date: 02/18/2022 CLINICAL DATA:  Left foot pain. EXAM: PORTABLE CHEST 1 VIEW COMPARISON:  October 02, 2021 FINDINGS: There is stable mild to moderate severity enlargement of the cardiac silhouette. Stable scarring and/or atelectasis is seen within the right lung base with stable right-sided volume loss. Mild infiltrate is seen within the left lung base. There is a small right pleural effusion. No pneumothorax is identified. A radiopaque fusion plate and screws are seen overlying the lower cervical spine. IMPRESSION: 1. Stable right basilar scarring and/or atelectasis with right-sided volume loss. 2. Mild left basilar infiltrate. 3. Small right pleural effusion. Electronically Signed   By: Virgina Norfolk M.D.   On: 02/18/2022 03:24      Assessment/Plan 1.  Bleeding varicosities  The patient is new to our practice for this issue.  The patient was scheduled for sclerotherapy however he has missed several sessions for treatment.  We will attempt to reschedule the sessions to help treat and hopefully reduce the recurrence of bleeding varicosities. 2.  Foot wound  The patient recently had ABIs in March which showed normal arterial flow.  Given the patient's swelling we believe that an [place] boot would be helpful  for the patient  We also welcome wound care recommendations. 3.  Hypertension Continue home medication  Family Communication: None at bedside  Thank you for allowing [place] to participate in the care of this patient.   Kris Hartmann, NP [facility] [phone] (Office Phone) [phone] (Office Fax) [phone] (Pager)  02/18/2022 9:17 PM  Staff may message me via secure chat in [place]  but this may not receive immediate response,  please page for urgent matters!  Dictation software was used to generate the above note. Typos may occur and escape review, as with typed/written notes. Any error is purely unintentional.  Please contact me directly for clarity if needed.

## 2022-02-18 NOTE — H&P (Signed)
[place]   PATIENT NAME: Louis Sanchez    MR#:  [id number]  DATE OF BIRTH:  1940-10-17  DATE OF ADMISSION:  02/18/2022  PRIMARY CARE PHYSICIAN: Adin Hector, MD   Patient is coming from: Home  REQUESTING/REFERRING PHYSICIAN: Ward, Delice Bison, DO  CHIEF COMPLAINT:   Chief Complaint  Patient presents with   Foot Pain    HISTORY OF PRESENT ILLNESS:  Louis Sanchez is a 81 y.o. 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  He denies any trauma.  He admitted to nausea without vomiting or abdominal pain.  He has been having dyspnea without significant cough or wheezing.  No chest pain or palpitations.  No dysuria, oliguria or hematuria or flank pain.  No other bleeding diathesis.  ED Course: When he came to the ER, BP was 92/47 and with hydration 117/60 with heart rate of 111 and otherwise normal vital signs but later respiratory rate was 23.  Labs revealed a blood glucose of 238 and BUN of 32 calcium of 8.3.  High sensitive troponin was 16.  Lactic acid was 1.3 and procalcitonin 0.59.  CBC showed leukocytosis of 18.2 with neutrophilia and anemia with hemoglobin 7.8 hematocrit 26.1 compared to 10.1 and 34.1 in March of this year.  Blood cultures were drawn EKG as reviewed by me : Showed atrial fibrillation with controlled ventricular sponsor of 98 with right axis deviation. Imaging: Portable chest x-ray showed stable right basilar scarring and/or atelectasis with right-sided volume loss and mild left basilar infiltrate with small right pleural effusion.  The patient was given 500 mg with tranexamic acid, D5 normal saline at 75 mill per hour and 650 mg of p.o. Tylenol as well as  1 unit of packed red blood cells.  He will be admitted to a progressive unit bed for further evaluation and management. PAST MEDICAL HISTORY:   Past Medical History:  Diagnosis Date   Arthritis    Benign neoplasm of colon 2004   Benign neoplasm of colon 04/04/2008   51mm tubular adenoma of the ascending colon   Cancer ([place])    liver and lungs   Heart disease    Other and unspecified hyperlipidemia 1997   Other specified disorder of male genital organs(608.89) 214   Sleep disturbance, unspecified    Thyroid disease    Unspecified hypothyroidism 2005  Anemia, peripheral neuropathy, hypertension, dyslipidemia, OSA, atrial fibrillation, systolic CHF and mitral regurgitation  PAST SURGICAL HISTORY:   Past Surgical History:  Procedure Laterality Date   APPENDECTOMY  1953   colon polyps  2009   [place]   Right   INGUINAL HERNIA REPAIR Right 2014   SPINE SURGERY  1997   neck   WRIST SURGERY      SOCIAL HISTORY:   Social History   Tobacco Use   Smoking status: Former    Years: 25.00    Types: Cigarettes   Smokeless tobacco: Never  Substance Use Topics   Alcohol use: Yes    Alcohol/week: 15.0 standard drinks of alcohol    Types: 15 Standard drinks or equivalent per week    FAMILY HISTORY:   Family History  Problem Relation Age of Onset   Alzheimer's disease Mother  DRUG ALLERGIES:   Allergies  Allergen Reactions   Atorvastatin     Other reaction(s): Muscle Pain Other reaction(s): Muscle Pain Other reaction(s): Muscle Pain Other reaction(s): Muscle Pain Other reaction(s): Muscle Pain    Levofloxacin Rash    REVIEW OF SYSTEMS:   ROS As per history of present illness. All pertinent systems were reviewed above. Constitutional, HEENT, cardiovascular, respiratory, GI, GU, musculoskeletal, neuro, psychiatric, endocrine, integumentary and hematologic systems were reviewed and are otherwise negative/unremarkable except for positive findings mentioned  above in the HPI.   MEDICATIONS AT HOME:   Prior to Admission medications   Medication Sig Start Date End Date Taking? Authorizing Provider  Ferrous Sulfate (IRON PO) Take 325 mg by mouth daily.   Yes [provider]  furosemide (LASIX) 40 MG tablet Take 20 mg by mouth daily. 11/23/20  Yes [provider]  gabapentin (NEURONTIN) 300 MG capsule Take 300 mg by mouth 3 (three) times daily.   Yes [provider]  levothyroxine (SYNTHROID) 175 MCG tablet Take 175 mcg by mouth daily before breakfast. 12/30/20 02/18/22 Yes [provider]  losartan (COZAAR) 25 MG tablet Take 12.5 mg by mouth daily. 11/16/20  Yes [provider]  Multiple Vitamin (MULTIVITAMIN) tablet Take 1 tablet by mouth daily.   Yes [provider]  mupirocin ointment (BACTROBAN) 2 % Apply 1 Application topically 2 (two) times daily for 7 days. Apply twice daily for seven days. 02/12/22 02/19/22 Yes Vallarie Mare M, PA-C  rivaroxaban (XARELTO) 20 MG TABS tablet Take 20 mg by mouth daily with supper.   Yes [provider]  rosuvastatin (CRESTOR) 10 MG tablet Take 10 mg by mouth daily.   Yes [provider]  sertraline (ZOLOFT) 100 MG tablet Take 1 tablet by mouth daily. 01/21/13  Yes [provider]  spironolactone (ALDACTONE) 50 MG tablet Take 1 tablet (50 mg total) by mouth 2 (two) times daily. 09/13/16  Yes Theodoro Grist, MD  tiotropium (SPIRIVA) 18 MCG inhalation capsule Place 18 mcg into inhaler and inhale daily.   Yes [provider]  acetaminophen (TYLENOL) 650 MG CR tablet Take 650 mg by mouth every 8 (eight) hours as needed for pain.    [provider]  nitroGLYCERIN (NITROSTAT) 0.4 MG SL tablet Place under the tongue. 09/01/20 09/24/21  [provider]      VITAL SIGNS:  Blood pressure (!) 108/58, pulse 95, temperature 97.8 F (36.6 C), temperature source Oral, resp. rate 19, SpO2 99 %.  PHYSICAL EXAMINATION:  Physical  Exam  GENERAL:  81 y.o.-year-old elderly Caucasian male patient lying in the bed with no acute distress.  EYES: Pupils equal, round, reactive to light and accommodation. No scleral icterus. Extraocular muscles intact.  HEENT: Head atraumatic, normocephalic. Oropharynx and nasopharynx clear.  NECK:  Supple, no jugular venous distention. No thyroid enlargement, no tenderness.  LUNGS: Minimally diminished bibasilar breath sounds, no wheezing, rales,rhonchi or crepitation. No use of accessory muscles of respiration.  CARDIOVASCULAR: Regular rate and rhythm, S1, S2 normal. No murmurs, rubs, or gallops.  ABDOMEN: Soft, nondistended, nontender. Bowel sounds present. No organomegaly or mass.  EXTREMITIES: No pedal edema, cyanosis, or clubbing.  NEUROLOGIC: Cranial nerves II through XII are intact. Muscle strength 5/5 in all extremities. Sensation intact. Gait not checked.  PSYCHIATRIC: The patient is alert and oriented x 3.  Normal affect and good eye contact. SKIN: Left foot erythema with skin erosions yellowish discharge warmth swelling and tenderness with good hemostasis at the site of bleeding  varicose vein earlier. LABORATORY PANEL:   CBC Recent Labs  Lab 02/18/22 0211  WBC 18.2*  HGB 7.8*  HCT 26.1*  PLT 305   ------------------------------------------------------------------------------------------------------------------  Chemistries  Recent Labs  Lab 02/18/22 0211  NA 138  K 4.4  CL 102  CO2 25  GLUCOSE 238*  BUN 32*  CREATININE 0.93  CALCIUM 8.3*   ------------------------------------------------------------------------------------------------------------------  Cardiac Enzymes No results for input(s): "TROPONINI" in the last 168 hours. ------------------------------------------------------------------------------------------------------------------  RADIOLOGY:  DG Chest Portable 1 View  Result Date: 02/18/2022 CLINICAL DATA:  Left foot pain. EXAM: PORTABLE CHEST 1  VIEW COMPARISON:  October 02, 2021 FINDINGS: There is stable mild to moderate severity enlargement of the cardiac silhouette. Stable scarring and/or atelectasis is seen within the right lung base with stable right-sided volume loss. Mild infiltrate is seen within the left lung base. There is a small right pleural effusion. No pneumothorax is identified. A radiopaque fusion plate and screws are seen overlying the lower cervical spine. IMPRESSION: 1. Stable right basilar scarring and/or atelectasis with right-sided volume loss. 2. Mild left basilar infiltrate. 3. Small right pleural effusion. Electronically Signed   By: Virgina Norfolk M.D.   On: 02/18/2022 03:24      IMPRESSION AND PLAN:  Assessment and Plan: * Symptomatic anemia - The will be admitted to a PCU bed. - His anemia is clearly acute blood loss anemia on top of chronic anemia. - He was typed and crossmatched will be transfused 1 unit of packed red blood cells. - Bleeding spot responded to pressure and he achieved adequate hemostasis so far. - We will follow his H&H posttransfusion.  Sepsis due to cellulitis [facility]) - The patient will be placed on IV  Rocephin. - Sepsis manifested by - Warm compresses will be provided. - He will be hydrated with IV normal saline. - We will monitor his blood pressure. - He may be manifesting severe sepsis due to hypotension  CAP (community acquired pneumonia) - This is left basal and likely bacterial. - This will be covered with IV Rocephin and Zithromax. - It could be contributing to sepsis. - We will provide mucolytic's. - We will follow blood cultures.  Paroxysmal atrial fibrillation (HCC) - We will continue Xarelto and beta-blocker therapy.  Dyslipidemia - We will continue statin therapy.  GERD without esophagitis - We will continue PPI therapy  Hypothyroidism - We will continue Synthroid.  Essential hypertension - We will continue  atenolol and Cozaar.   DVT prophylaxis:  Xarelto.   Advanced Care Planning:  Code Status: full code.  Family Communication:  The plan of care was discussed in details with the patient (and family). I answered all questions. The patient agreed to proceed with the above mentioned plan. Further management will depend upon hospital course. Disposition Plan: Back to previous home environment Consults called: none.  All the records are reviewed and case discussed with ED provider.  Status is: Inpatient   At the time of the admission, it appears that the appropriate admission status for this patient is inpatient.  This is judged to be reasonable and necessary in order to provide the required intensity of service to ensure the patient's safety given the presenting symptoms, physical exam findings and initial radiographic and laboratory data in the context of comorbid conditions.  The patient requires inpatient status due to high intensity of service, high risk of further deterioration and high frequency of surveillance required.  I certify that at the time of admission, it is my clinical  judgment that the patient will require inpatient hospital care extending more than 2 midnights.                            Dispo: The patient is from: Home              Anticipated d/c is to: Home              Patient currently is not medically stable to d/c.              Difficult to place patient: No  Christel Mormon M.D on 02/18/2022 at 6:20 AM  [facility]   From 7 PM-7 AM, contact night-coverage [email or web address]  CC: Primary care physician; Adin Hector, MD

## 2022-02-18 NOTE — ED Triage Notes (Signed)
Pt presents to ER via ems from home with c/o left foot pain after bursting a varicose vein in his left foot around 1hr pta.  Denies injury to foot.  Pt states this has happened several times before.  Pt takes xarelto at home.  Bleeding controlled with pressure at this time.  Pt alert and in NAD at this time.

## 2022-02-18 NOTE — Consult Note (Signed)
WOC Nurse Consult Note: Reason for Consult:left foot wounds related to varicosities, previous vascular intervention required.  On blood thinner for Afib Wound type:full thickness ulceration with bleeding Pressure Injury POA: NA Measurement:see nursing flow sheet  Wound bed: see nursing flow sheet Drainage (amount, consistency, odor) bleeding controlled per ED MD notes Periwound: intact  Dressing procedure/placement/frequency: Add calcium alginate for hemostatic properties. Change daily Consider surgicel if further bleeding occurs, would need MD order for this Consider vascular consult if sclerotherapy desired as needed previously  [place], [place], [place]

## 2022-02-18 NOTE — Assessment & Plan Note (Addendum)
His varicose vein bleed which has been going on intermittently for some time.  Apparently patient missed multiple appointments with vascular surgery for cryotherapy.  Patient is on Xarelto which makes him high risk for bleeding. Hemoglobin dropped to 6.7 after getting 1 unit of PRBC. Currently hemostasis achieved -Give him another unit of PRBC -Monitor hemoglobin

## 2022-02-18 NOTE — Hospital Course (Addendum)
Taken from H&P.  Louis Sanchez is a 81 y.o. 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  He denies any trauma.  He admitted to nausea without vomiting or abdominal pain.  He has been having dyspnea without significant cough or wheezing.  No chest pain or palpitations.  No dysuria, oliguria or hematuria or flank pain.  No other bleeding diathesis.  ED Course: When he came to the ER, BP was 92/47 and with hydration 117/60 with heart rate of 111 and otherwise normal vital signs but later respiratory rate was 23.  Labs revealed a blood glucose of 238 and BUN of 32 calcium of 8.3.  High sensitive troponin was 16.  Lactic acid was 1.3 and procalcitonin 0.59.  CBC showed leukocytosis of 18.2 with neutrophilia and anemia with hemoglobin 7.8 hematocrit 26.1 compared to 10.1 and 34.1 in March of this year.  Blood cultures were drawn EKG  Showed atrial fibrillation with controlled ventricular sponsor of 98 with right axis deviation. Imaging: Portable chest x-ray showed stable right basilar scarring and/or atelectasis with right-sided volume loss and mild left basilar infiltrate with small right pleural effusion.  The patient was given 500 mg with tranexamic acid, D5 normal saline at 75 mill per hour and 650 mg of p.o. Tylenol as well as 1 unit of packed red blood cells.  7/28: Preliminary blood cultures negative in 12 hours, patient was on Xarelto which is being held.  Hemostasis achieved.  Received 1 unit of PRBC Posttransfusion hemoglobin dropped to 6.7.  Ordered another unit. Vascular surgery was also consulted, apparently patient missed multiple appointments with them as they were aware of his problem and  he needs sclerotherapy to prevent varicocele vein bleeding. Wound care was also consulted.  7/29: Patient had 1 episode of fever at 100.2.  Also received 2 unit of PRBC.  Slight worsening of leukocytosis today.  Hemoglobin improved to 8.4. Vascular surgery is recommending Unna boot. Wound care also recommending calcium alginate dressing for hemostasis.  He is being discharged with 5 days of Bactrim as blood cultures remain negative. Patient need to have a follow-up with vascular surgery for definitive treatment of his recurrent bleeding. Home health wound care was also arranged.  He will continue on current medications and follow-up with his providers.

## 2022-02-18 NOTE — Progress Notes (Signed)
Progress Note   Patient: Louis Sanchez EYE:[id number] DOB: 30-Jan-1941 DOA: 02/18/2022     0 DOS: the patient was seen and examined on 02/18/2022   Brief hospital course: Taken from H&P.  Louis Sanchez is a 81 y.o. 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  He denies any trauma.  He admitted to nausea without vomiting or abdominal pain.  He has been having dyspnea without significant cough or wheezing.  No chest pain or palpitations.  No dysuria, oliguria or hematuria or flank pain.  No other bleeding diathesis.  ED Course: When he came to the ER, BP was 92/47 and with hydration 117/60 with heart rate of 111 and otherwise normal vital signs but later respiratory rate was 23.  Labs revealed a blood glucose of 238 and BUN of 32 calcium of 8.3.  High sensitive troponin was 16.  Lactic acid was 1.3 and procalcitonin 0.59.  CBC showed leukocytosis of 18.2 with neutrophilia and anemia with hemoglobin 7.8 hematocrit 26.1 compared to 10.1 and 34.1 in March of this year.  Blood cultures were drawn EKG  Showed atrial fibrillation with controlled ventricular sponsor of 98 with right axis deviation. Imaging: Portable chest x-ray showed stable right basilar scarring and/or atelectasis with right-sided volume loss and mild left basilar infiltrate with small right pleural effusion.  The patient was given 500 mg with tranexamic acid, D5 normal saline at 75 mill per hour and 650 mg of p.o. Tylenol as well as 1 unit of packed red blood cells.  7/28: Preliminary blood cultures negative in 12 hours, patient was on Xarelto which is being held.  Hemostasis achieved.  Received 1 unit of PRBC Posttransfusion  hemoglobin dropped to 6.7.  Ordered another unit. Vascular surgery was also consulted, apparently patient missed multiple appointments with them as they were aware of his problem and he needs cryotherapy. Wound care was also consulted.    Assessment and Plan: * Symptomatic anemia His varicose vein bleed which has been going on intermittently for some time.  Apparently patient missed multiple appointments with vascular surgery for cryotherapy.  Patient is on Xarelto which makes him high risk for bleeding. Hemoglobin dropped to 6.7 after getting 1 unit of PRBC. Currently hemostasis achieved -Give him another unit of PRBC -Monitor hemoglobin  Sepsis due to cellulitis [facility]) Concern of left foot cellulitis.  Met sepsis criteria with leukocytosis, tachycardia and tachypnea.  Borderline blood pressure which responded well to IV fluid. 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  Essential hypertension Blood pressure within goal. -Continue home dose of losartan  Hypothyroidism - We will continue Synthroid.  GERD without esophagitis - We will continue PPI therapy  Dyslipidemia - We will continue statin therapy.  Paroxysmal atrial fibrillation (HCC) Not on any rate control medication at home. -Holding Xarelto for concern of acute bleeding   Subjective: Patient was experiencing intermittent bleeding from left foot, called EMS multiple times for the similar reason.  Physical Exam: Vitals:   02/18/22 0630 02/18/22 0645 02/18/22 0744 02/18/22 1100  BP: 97/61 (!) 104/57 (!) 106/53 123/72  Pulse: (!) 101 94 96 94  Resp: '18 17 18 ' Marland Kitchen)  23  Temp:   98.6 F (37 C)   TempSrc:      SpO2:    96%   General.  Well-developed elderly man, in no acute distress. Pulmonary.  Lungs clear bilaterally, normal respiratory effort. CV.   Regular rate and rhythm, no JVD, rub or murmur. Abdomen.  Soft, nontender, nondistended, BS positive. CNS.  Alert and oriented .  No focal neurologic deficit. Extremities.  Dorsum of left foot with multiple shallow ulcerations with surrounding edema, pulses intact and symmetrical. Psychiatry.  Judgment and insight appears normal.       Data Reviewed: Prior data reviewed  Family Communication: Discussed with son-in-law at bedside  Disposition: Status is: Inpatient Remains inpatient appropriate because: Severity of illness   Planned Discharge Destination: Home  Time spent: 40 minutes  This record has been created using Systems analyst. Errors have been sought and corrected,but may not always be located. Such creation errors do not reflect on the standard of care.  Author: Lorella Nimrod, MD 02/18/2022 2:00 PM  For on call review [email or web address].

## 2022-02-18 NOTE — Assessment & Plan Note (Signed)
-   We will continue Synthroid. 

## 2022-02-18 NOTE — Assessment & Plan Note (Addendum)
Concern of left foot cellulitis.  Met sepsis criteria with leukocytosis, tachycardia and tachypnea.  Borderline blood pressure which responded well to IV fluid. 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

## 2022-02-18 NOTE — Assessment & Plan Note (Signed)
-   We will continue statin therapy. 

## 2022-02-18 NOTE — Assessment & Plan Note (Signed)
-   We will continue PPI therapy 

## 2022-02-18 NOTE — Assessment & Plan Note (Addendum)
Patient has no respiratory symptoms, less likely having pneumonia.

## 2022-02-18 NOTE — Assessment & Plan Note (Addendum)
Not on any rate control medication at home. -Holding Xarelto for concern of acute bleeding

## 2022-02-19 DIAGNOSIS — D649 Anemia, unspecified: Secondary | ICD-10-CM | POA: Diagnosis not present

## 2022-02-19 DIAGNOSIS — A419 Sepsis, unspecified organism: Secondary | ICD-10-CM | POA: Diagnosis not present

## 2022-02-19 LAB — BPAM RBC
Blood Product Expiration Date: 202308192359
Blood Product Expiration Date: 202309022359
ISSUE DATE / TIME: 202307280536
ISSUE DATE / TIME: 202307281431
Unit Type and Rh: 5100
Unit Type and Rh: 9500

## 2022-02-19 LAB — TYPE AND SCREEN
ABO/RH(D): O NEG
Antibody Screen: NEGATIVE
Unit division: 0
Unit division: 0

## 2022-02-19 LAB — CBC
HCT: 27.1 % — ABNORMAL LOW (ref 39.0–52.0)
Hemoglobin: 8.4 g/dL — ABNORMAL LOW (ref 13.0–17.0)
MCH: 27.6 pg (ref 26.0–34.0)
MCHC: 31 g/dL (ref 30.0–36.0)
MCV: 89.1 fL (ref 80.0–100.0)
Platelets: 241 10*3/uL (ref 150–400)
RBC: 3.04 MIL/uL — ABNORMAL LOW (ref 4.22–5.81)
RDW: 17 % — ABNORMAL HIGH (ref 11.5–15.5)
WBC: 18 10*3/uL — ABNORMAL HIGH (ref 4.0–10.5)
nRBC: 0 % (ref 0.0–0.2)

## 2022-02-19 MED ORDER — SULFAMETHOXAZOLE-TRIMETHOPRIM 800-160 MG PO TABS: 1.0000 | ORAL_TABLET | Freq: Two times a day (BID) | ORAL | 0 refills | Status: DC

## 2022-02-19 MED ORDER — DOXYCYCLINE HYCLATE 100 MG PO TABS
100.0000 mg | ORAL_TABLET | Freq: Two times a day (BID) | ORAL | 0 refills | Status: AC
Start: 2022-02-20 — End: 2022-02-25

## 2022-02-19 MED ORDER — CEFTRIAXONE SODIUM 2 G IJ SOLR
2.0000 g | INTRAMUSCULAR | Status: DC
  Administered 2022-02-19: 2 g via INTRAVENOUS
  Filled 2022-02-19: qty 2

## 2022-02-19 NOTE — Plan of Care (Signed)

## 2022-02-19 NOTE — Assessment & Plan Note (Signed)
His varicose vein bleed which has been going on intermittently for some time.  Apparently patient missed multiple appointments with vascular surgery for cryotherapy.  Patient is on Xarelto which makes him high risk for bleeding. Hemoglobin at 8.4 after getting 2 unit of PRBC Currently hemostasis achieved -Monitor hemoglobin

## 2022-02-19 NOTE — Plan of Care (Signed)
Pt's feet dressings changed.   Problem: Education: Goal: Knowledge of General Education information will improve Description: Including pain rating scale, medication(s)/side effects and non-pharmacologic comfort measures 02/19/2022 1131 by Alferd Apa, RN Outcome: Adequate for Discharge 02/19/2022 0808 by Alferd Apa, RN Outcome: Progressing   Problem: Health Behavior/Discharge Planning: Goal: Ability to manage health-related needs will improve 02/19/2022 1131 by Alferd Apa, RN Outcome: Adequate for Discharge 02/19/2022 0808 by Alferd Apa, RN Outcome: Progressing   Problem: Clinical Measurements: Goal: Ability to maintain clinical measurements within normal limits will improve 02/19/2022 1131 by Alferd Apa, RN Outcome: Adequate for Discharge 02/19/2022 0808 by Alferd Apa, RN Outcome: Progressing Goal: Will remain free from infection 02/19/2022 1131 by Alferd Apa, RN Outcome: Adequate for Discharge 02/19/2022 0808 by Alferd Apa, RN Outcome: Progressing Goal: Diagnostic test results will improve 02/19/2022 1131 by Alferd Apa, RN Outcome: Adequate for Discharge 02/19/2022 0808 by Alferd Apa, RN Outcome: Progressing Goal: Respiratory complications will improve 02/19/2022 1131 by Alferd Apa, RN Outcome: Adequate for Discharge 02/19/2022 0808 by Alferd Apa, RN Outcome: Progressing Goal: Cardiovascular complication will be avoided 02/19/2022 1131 by Alferd Apa, RN Outcome: Adequate for Discharge 02/19/2022 0109 by Alferd Apa, RN Outcome: Progressing   Problem: Activity: Goal: Risk for activity intolerance will decrease 02/19/2022 1131 by Alferd Apa, RN Outcome: Adequate for Discharge 02/19/2022 0808 by Alferd Apa, RN Outcome: Progressing   Problem: Nutrition: Goal: Adequate nutrition will be maintained 02/19/2022 1131 by Alferd Apa, RN Outcome: Adequate for Discharge 02/19/2022 0808 by Alferd Apa, RN Outcome: Progressing   Problem:  Coping: Goal: Level of anxiety will decrease 02/19/2022 1131 by Alferd Apa, RN Outcome: Adequate for Discharge 02/19/2022 0808 by Alferd Apa, RN Outcome: Progressing   Problem: Elimination: Goal: Will not experience complications related to bowel motility 02/19/2022 1131 by Alferd Apa, RN Outcome: Adequate for Discharge 02/19/2022 0808 by Alferd Apa, RN Outcome: Progressing Goal: Will not experience complications related to urinary retention 02/19/2022 1131 by Alferd Apa, RN Outcome: Adequate for Discharge 02/19/2022 0808 by Alferd Apa, RN Outcome: Progressing   Problem: Pain Managment: Goal: General experience of comfort will improve 02/19/2022 1131 by Alferd Apa, RN Outcome: Adequate for Discharge 02/19/2022 0808 by Alferd Apa, RN Outcome: Progressing   Problem: Safety: Goal: Ability to remain free from injury will improve 02/19/2022 1131 by Alferd Apa, RN Outcome: Adequate for Discharge 02/19/2022 0808 by Alferd Apa, RN Outcome: Progressing   Problem: Skin Integrity: Goal: Risk for impaired skin integrity will decrease 02/19/2022 1131 by Alferd Apa, RN Outcome: Adequate for Discharge 02/19/2022 3235 by Alferd Apa, RN Outcome: Progressing

## 2022-02-19 NOTE — Assessment & Plan Note (Signed)
Not on any rate control medication at home. -Holding Xarelto for concern of acute bleeding while in the hospital and he can resume on discharge. Patient needed risk versus benefit discussion regarding anticoagulation with his cardiologist

## 2022-02-19 NOTE — Discharge Summary (Addendum)
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 Follow-up with primary care provider. If patient continues to have recurrent bleeding-need discussion regarding continuation of Xarelto.  Discharge Diagnoses: Principal Problem:   Symptomatic anemia Active Problems:   Sepsis due to cellulitis ([place])   CAP (community acquired pneumonia)   Essential hypertension   Hypothyroidism   GERD without esophagitis   Dyslipidemia   Paroxysmal atrial fibrillation [facility])   Hospital Course: Taken from H&P.  Louis Sanchez is a 81 y.o. 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  He denies any trauma.  He admitted to nausea without vomiting or abdominal pain.  He has been having dyspnea without significant cough or wheezing.  No chest pain or palpitations.  No dysuria, oliguria or hematuria or flank pain.  No other bleeding diathesis.  ED Course: When he came to the ER, BP was 92/47 and with hydration 117/60 with heart rate of 111 and otherwise normal vital signs but later respiratory rate was 23.  Labs revealed a blood glucose of 238 and BUN of 32 calcium of 8.3.  High sensitive troponin was 16.  Lactic acid was 1.3 and procalcitonin 0.59.  CBC showed leukocytosis of 18.2 with neutrophilia and anemia with hemoglobin 7.8 hematocrit 26.1 compared to 10.1 and 34.1 in March of this  year.  Blood cultures were drawn EKG  Showed atrial fibrillation with controlled ventricular sponsor of 98 with right axis deviation. Imaging: Portable chest x-ray showed stable right basilar scarring and/or atelectasis with right-sided volume loss and mild left basilar infiltrate with small right pleural effusion.  The patient was given 500 mg with tranexamic acid, D5 normal saline at 75 mill per hour and 650 mg of p.o. Tylenol as well as 1 unit of packed red blood cells.  7/28: Preliminary blood cultures negative in 12 hours, patient was on Xarelto which is being held.  Hemostasis achieved.  Received 1 unit of PRBC Posttransfusion hemoglobin dropped to 6.7.  Ordered another unit. Vascular surgery was also consulted, apparently patient missed multiple appointments with them as they were aware of his problem and he needs sclerotherapy to prevent varicocele vein bleeding. Wound care was also consulted.  7/29: Patient had 1 episode of fever at 100.2.  Also received 2 unit of PRBC.  Slight worsening of leukocytosis today.  Hemoglobin improved to 8.4. Vascular surgery is recommending Unna boot. Wound care also recommending calcium alginate dressing for hemostasis.  He is being discharged with 5 days of doxycycline as blood cultures remain negative. Patient need to have a follow-up with vascular surgery for definitive treatment of his recurrent bleeding. Home health wound care was also arranged.  He will continue on current medications and follow-up with his providers.   Assessment and Plan: * Symptomatic anemia His varicose vein bleed which has been going on intermittently for some time.  Apparently patient missed multiple appointments with vascular surgery for cryotherapy.  Patient is on Xarelto which  makes him high risk for bleeding. Hemoglobin at 8.4 after getting 2 unit of PRBC Currently hemostasis achieved -Monitor hemoglobin  Sepsis due to cellulitis [facility]) Concern of left foot  cellulitis.  Met sepsis criteria with leukocytosis, tachycardia and tachypnea.  Borderline blood pressure which responded well to IV fluid. 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  Essential hypertension Blood pressure within goal. -Continue home dose of losartan  Hypothyroidism - We will continue Synthroid.  GERD without esophagitis - We will continue PPI therapy  Dyslipidemia - We will continue statin therapy.  Paroxysmal atrial fibrillation (HCC) Not on any rate control medication at home. -Holding Xarelto for concern of acute bleeding while in the hospital and he can resume on discharge. Patient needed risk versus benefit discussion regarding anticoagulation with his cardiologist   Consultants: Vascular surgery Procedures performed: None Disposition: Home Diet recommendation:  Discharge Diet Orders (From admission, onward)     Start     Ordered   02/19/22 0000  Diet - low sodium heart healthy        02/19/22 1048           Cardiac diet DISCHARGE MEDICATION: Allergies as of 02/19/2022       Reactions   Atorvastatin    Other reaction(s): Muscle Pain Other reaction(s): Muscle Pain Other reaction(s): Muscle Pain Other reaction(s): Muscle Pain Other reaction(s): Muscle Pain   Levofloxacin Rash        Medication List     TAKE these medications    acetaminophen 650 MG CR tablet Commonly known as: TYLENOL Take 650 mg by mouth every 8 (eight) hours as needed for pain.   doxycycline 100 MG tablet Commonly known as: VIBRA-TABS Take 1 tablet (100 mg total) by mouth 2 (two) times daily for 10 doses. Start taking on: February 20, 2022   furosemide 40 MG tablet Commonly known as: LASIX Take 20 mg by mouth daily.   gabapentin 300 MG capsule Commonly known as: NEURONTIN Take 300 mg by mouth 3  (three) times daily.   IRON PO Take 325 mg by mouth daily.   levothyroxine 175 MCG tablet Commonly known as: SYNTHROID Take 175 mcg by mouth daily before breakfast.   losartan 25 MG tablet Commonly known as: COZAAR Take 12.5 mg by mouth daily.   multivitamin tablet Take 1 tablet by mouth daily.   mupirocin ointment 2 % Commonly known as: BACTROBAN Apply 1 Application topically 2 (two) times daily for 7 days. Apply twice daily for seven days.   nitroGLYCERIN 0.4 MG SL tablet Commonly known as: NITROSTAT Place under the tongue.   rivaroxaban 20 MG Tabs tablet Commonly known as: XARELTO Take 20 mg by mouth daily with supper.   rosuvastatin 10 MG tablet Commonly known as: CRESTOR Take 10 mg by mouth daily.   sertraline 100 MG tablet Commonly known as: ZOLOFT Take 1 tablet by mouth daily.   spironolactone 50 MG tablet Commonly known as: ALDACTONE Take 1 tablet (50 mg total) by mouth 2 (two) times daily.   tiotropium 18 MCG inhalation capsule Commonly known as: SPIRIVA Place 18 mcg into inhaler and inhale daily.               Discharge Care Instructions  (From admission, onward)           Start     Ordered   02/19/22 0000  Discharge  wound care:       Comments: Apply calcium alginate Kellie Simmering # [id number]) to the foot wounds, top with ABD pads, secure with kerlix and ACE wrap. Change every day.   02/19/22 1048            Follow-up Information     Adin Hector, MD. Schedule an appointment as soon as possible for a visit.   Specialty: Internal Medicine Contact information: [place] [address] [phone]         Delana Meyer, Dolores Lory, MD. Schedule an appointment as soon as possible for a visit in 1 week(s).   Specialties: Vascular Surgery, Cardiology, Radiology, Vascular Surgery Contact information: [place] [address] [phone]                Discharge Exam: Danley Danker  Weights   02/18/22 1712  Weight: 56.3 kg   General.  Frail elderly man, in no acute distress. Pulmonary.  Lungs clear bilaterally, normal respiratory effort. CV.  Regular rate and rhythm, no JVD, rub or murmur. Abdomen.  Soft, nontender, nondistended, BS positive. CNS.  Alert and oriented .  No focal neurologic deficit. Extremities.  No edema, no cyanosis, pulses intact and symmetrical.  Dorsum of left foot with multiple shallow ulcers with surrounding erythema. Psychiatry.  Judgment and insight appears normal.   Condition at discharge: stable  The results of significant diagnostics from this hospitalization (including imaging, microbiology, ancillary and laboratory) are listed below for reference.   Imaging Studies: DG Chest Portable 1 View  Result Date: 02/18/2022 CLINICAL DATA:  Left foot pain. EXAM: PORTABLE CHEST 1 VIEW COMPARISON:  October 02, 2021 FINDINGS: There is stable mild to moderate severity enlargement of the cardiac silhouette. Stable scarring and/or atelectasis is seen within the right lung base with stable right-sided volume loss. Mild infiltrate is seen within the left lung base. There is a small right pleural effusion. No pneumothorax is identified. A radiopaque fusion plate and screws are seen overlying the lower cervical spine. IMPRESSION: 1. Stable right basilar scarring and/or atelectasis with right-sided volume loss. 2. Mild left basilar infiltrate. 3. Small right pleural effusion. Electronically Signed   By: Virgina Norfolk M.D.   On: 02/18/2022 03:24    Microbiology: Results for orders placed or performed during the hospital encounter of 02/18/22  Blood culture (routine x 2)     Status: None (Preliminary result)   Collection Time: 02/18/22  5:09 AM   Specimen: BLOOD  Result Value Ref Range Status   Specimen Description BLOOD RIGHT FA  Final   Special Requests IN PEDIATRIC BOTTLE Blood Culture adequate volume  Final   Culture   Final    NO GROWTH 1  DAY Performed at [facility], [address], [place], [place] [address]    Report Status PENDING  Incomplete  Blood culture (routine x 2)     Status: None (Preliminary result)   Collection Time: 02/18/22  5:09 AM   Specimen: BLOOD  Result Value Ref Range Status   Specimen Description BLOOD LEFT FA  Final   Special Requests   Final    BOTTLES DRAWN AEROBIC AND ANAEROBIC Blood Culture results may not be optimal due to an inadequate volume of blood received in culture bottles   Culture   Final    NO GROWTH 1 DAY Performed at [facility], [address] [place], [place] [address]    Report Status PENDING  Incomplete  SARS  Coronavirus 2 by RT PCR (hospital order, performed in [facility] hospital lab) *cepheid single result test* Anterior Nasal Swab     Status: None   Collection Time: 02/18/22  6:50 AM   Specimen: Anterior Nasal Swab  Result Value Ref Range Status   SARS Coronavirus 2 by RT PCR NEGATIVE NEGATIVE Final    Comment: (NOTE) SARS-CoV-2 target nucleic acids are NOT DETECTED.  The SARS-CoV-2 RNA is generally detectable in upper and lower respiratory specimens during the acute phase of infection. The lowest concentration of SARS-CoV-2 viral copies this assay can detect is 250 copies / mL. A negative result does not preclude SARS-CoV-2 infection and should not be used as the sole basis for treatment or other patient management decisions.  A negative result may occur with improper specimen collection / handling, submission of specimen other than nasopharyngeal swab, presence of viral mutation(s) within the areas targeted by this assay, and inadequate number of viral copies (<250 copies / mL). A negative result must be combined with clinical observations, patient history, and epidemiological information.  Fact Sheet for Patients:   https://www.patel.info/  Fact Sheet for Healthcare  Providers: https://hall.com/  This test is not yet approved or  cleared by the [place] FDA and has been authorized for detection and/or diagnosis of SARS-CoV-2 by FDA under an Emergency Use Authorization (EUA).  This EUA will remain in effect (meaning this test can be used) for the duration of the COVID-19 declaration under Section 564(b)(1) of the Act, 21 U.S.C. section 360bbb-3(b)(1), unless the authorization is terminated or revoked sooner.  Performed at [facility], [address] [place], [place] [address]     Labs: CBC: Recent Labs  Lab 02/18/22 0211 02/18/22 1013 02/18/22 1850 02/19/22 0405  WBC 18.2* 12.5*  --  18.0*  NEUTROABS 15.9*  --   --   --   HGB 7.8* 6.7* 7.8* 8.4*  HCT 26.1* 21.3* 25.1* 27.1*  MCV 90.6 87.3  --  89.1  PLT 305 173  --  226   Basic Metabolic Panel: Recent Labs  Lab 02/18/22 0211 02/18/22 1013  NA 138 138  K 4.4 4.6  CL 102 105  CO2 25 26  GLUCOSE 238* 164*  BUN 32* 26*  CREATININE 0.93 0.76  CALCIUM 8.3* 7.4*   Liver Function Tests: Recent Labs  Lab 02/18/22 1013  AST 25  ALT 13  ALKPHOS 60  BILITOT 0.5  PROT 5.6*  ALBUMIN 2.2*   CBG: No results for input(s): "GLUCAP" in the last 168 hours.  Discharge time spent: greater than 30 minutes.  This record has been created using Systems analyst. Errors have been sought and corrected,but may not always be located. Such creation errors do not reflect on the standard of care.   Signed: Lorella Nimrod, MD [facility] 02/19/2022

## 2022-02-19 NOTE — Care Management CC44 (Signed)
Condition Code 44 Documentation Completed  Patient Details  Name: Louis Sanchez MRN: [id number] Date of Birth: 1941-03-28   Condition Code 44 given:  Yes Patient signature on Condition Code 44 notice:  Yes Documentation of 2 MD's agreement:  Yes Code 44 added to claim:  Yes    Izola Price, RN 02/19/2022, 9:11 AM

## 2022-02-19 NOTE — Progress Notes (Signed)
    Durable Medical Equipment  (From admission, onward)           Start     Ordered   02/19/22 1111  For home use only DME lightweight manual wheelchair with seat cushion  Once       Comments: Patient suffers from generalized weakness which impairs their ability to perform daily activities like toileting in the home.  A walker will not resolve  issue with performing activities of daily living. A wheelchair will allow patient to safely perform daily activities. Patient is not able to propel themselves in the home using a standard weight wheelchair due to general weakness. Patient can self propel in the lightweight wheelchair. Length of need Lifetime. Accessories: elevating leg rests (ELRs), wheel locks, extensions and anti-tippers.   02/19/22 1111

## 2022-02-19 NOTE — TOC Transition Note (Addendum)
Transition of Care [facility]) - CM/SW Discharge Note   Patient Details  Name: Louis Sanchez MRN: [id number] Date of Birth: 21-Dec-1940  Transition of [facility]) CM/SW Contact:  Louis Price, RN Phone Number: 02/19/2022, 11:17 AM   Clinical Narrative:  7/29: Discharge orders today with [facility] and DME orders in. [facility] via PCP office prior to this admission and confirmed with [facility]. Wheelchair DME orders in and ordered via [employer]Louis Sanchez. Attempted to reach spouse to see if wants Wheelchair delivered to home or hospital room and left a VM. [facility]Louis Sanchez to reach out as well. Louis Davies RN CM    Update: Per patient he has a Wheelchair at home. Order is in if need per spouse when she arrives to pick up. Order/deliver on hold at [place]. Louis Davies RN CM   Final next level of care: [place] Barriers to Discharge: Barriers Resolved   Patient Goals and CMS Choice     Choice offered to / list presented to : Patient  Discharge Placement                       Discharge Plan and Services                DME Arranged: Wheelchair manual DME Agency: [employer] Date DME Agency Contacted: 02/19/22 Time DME Agency Contacted: 1117 Representative spoke with at DME Agency: Louis Sanchez: RN, PT [facility] Agency: [place] ([place]) Date [place]: 02/19/22 Time [place]: 1104 Representative spoke with at [place]: Louis Sanchez  Social Determinants of Health (SDOH) Interventions     Readmission Risk Interventions     No data to display

## 2022-02-20 LAB — URINE CULTURE: Culture: NO GROWTH

## 2022-02-23 LAB — CULTURE, BLOOD (ROUTINE X 2)
Culture: NO GROWTH
Culture: NO GROWTH
Special Requests: ADEQUATE

## 2022-03-01 ENCOUNTER — Encounter (INDEPENDENT_AMBULATORY_CARE_PROVIDER_SITE_OTHER): Payer: Self-pay | Admitting: Nurse Practitioner

## 2022-03-01 ENCOUNTER — Ambulatory Visit (INDEPENDENT_AMBULATORY_CARE_PROVIDER_SITE_OTHER): Payer: Medicare Other | Admitting: Nurse Practitioner

## 2022-03-01 VITALS — BP 115/63 | HR 94 | Resp 17

## 2022-03-01 DIAGNOSIS — I83893 Varicose veins of bilateral lower extremities with other complications: Secondary | ICD-10-CM | POA: Diagnosis not present

## 2022-03-01 DIAGNOSIS — S91309A Unspecified open wound, unspecified foot, initial encounter: Secondary | ICD-10-CM

## 2022-03-01 NOTE — Progress Notes (Signed)
Subjective:    Patient ID: Louis Sanchez, male    DOB: [birth year], 81 y.o.   MRN: [id number] No chief complaint on file.   Louis Sanchez is a 81 year old male that presents to [place] for wounds on his bilateral feet.  The patient was recently admitted to the hospital due to these wounds.  He had some bleeding from varicosities as well as cellulitis of the ulcerations.  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  The left great saphenous vein had reflux in the mid thigh portion.  The patient notes that he has ulcers to his bilateral feet on the dorsal surface.  He notes that these wounds come and go and they are very painful.  He notes that they are associated with extensive drainage and usually blistering before they occur.  He also has some notable spider varicosities and notes that these will also have episodes or these rupture and bleed.  He has had multiple episodes of rupturing bleeding that have been difficult to stop.  When he does not have wounds he has tried to wear compression but it is difficult with the open ulcerations on his feet.    Review of Systems  Cardiovascular:  Positive for leg swelling.  Musculoskeletal:  Positive for arthralgias and gait problem.  Skin:  Positive for wound.  All other systems reviewed and are negative.      Objective:   Physical Exam Vitals reviewed.  HENT:     Head: Normocephalic.  Cardiovascular:     Rate and Rhythm: Normal rate.  Pulmonary:     Effort: Pulmonary effort is normal.  Skin:    General: Skin is warm and dry.  Neurological:     Mental Status: He is alert and oriented to person, place, and time.  Psychiatric:        Mood and Affect: Mood normal.        Behavior: Behavior normal.        Thought Content: Thought content normal.         Judgment: Judgment normal.     BP 115/63 (BP Location: Left Arm)   Pulse 94   Resp 17   Past Medical History:  Diagnosis Date   Arthritis    Benign neoplasm of colon 2004   Benign neoplasm of colon 04/04/2008   35mm tubular adenoma of the ascending colon   Cancer (HCC)    liver and lungs   Heart disease    Other and unspecified hyperlipidemia 1997   Other specified disorder of male genital organs(608.89) 214   Sleep disturbance, unspecified    Thyroid disease    Unspecified hypothyroidism 2005    Social History   Socioeconomic History   Marital status: Married    Spouse name: Not on file   Number of children: Not on file   Years of education: Not on file   Highest education level: Not on file  Occupational History   Not on file  Tobacco Use   Smoking status: Former    Years: 25.00    Types: Cigarettes   Smokeless tobacco: Never  Substance and Sexual Activity   Alcohol use: Yes    Alcohol/week: 15.0 standard drinks of alcohol    Types: 15 Standard drinks or equivalent per week   Drug  use: No   Sexual activity: Not on file  Other Topics Concern   Not on file  Social History Narrative   Not on file   Social Determinants of Health   Financial Resource Strain: Not on file  Food Insecurity: Not on file  Transportation Needs: Not on file  Physical Activity: Not on file  Stress: Not on file  Social Connections: Not on file  Intimate Partner Violence: Not on file    Past Surgical History:  Procedure Laterality Date   APPENDECTOMY  1953   colon polyps  2009   Wall   Right   INGUINAL HERNIA REPAIR Right 2014   SPINE SURGERY  1997   neck   WRIST SURGERY      Family History  Problem Relation Age of Onset   Alzheimer's disease Mother     Allergies  Allergen Reactions   Atorvastatin     Other reaction(s): Muscle Pain Other reaction(s): Muscle Pain Other reaction(s): Muscle Pain Other reaction(s): Muscle Pain Other reaction(s):  Muscle Pain    Levofloxacin Rash       Latest Ref Rng & Units 02/19/2022    4:05 AM 02/18/2022    6:50 PM 02/18/2022   10:13 AM  CBC  WBC 4.0 - 10.5 K/uL 18.0   12.5   Hemoglobin 13.0 - 17.0 g/dL 8.4  7.8  6.7   Hematocrit 39.0 - 52.0 % 27.1  25.1  21.3   Platelets 150 - 400 K/uL 241   173       CMP     Component Value Date/Time   NA 138 02/18/2022 1013   NA 139 01/30/2014 1705   K 4.6 02/18/2022 1013   K 4.4 01/30/2014 1705   CL 105 02/18/2022 1013   CL 102 01/30/2014 1705   CO2 26 02/18/2022 1013   CO2 29 01/30/2014 1705   GLUCOSE 164 (H) 02/18/2022 1013   GLUCOSE 109 (H) 01/30/2014 1705   BUN 26 (H) 02/18/2022 1013   BUN 21 (H) 01/30/2014 1705   CREATININE 0.76 02/18/2022 1013   CREATININE 1.03 01/30/2014 1705   CALCIUM 7.4 (L) 02/18/2022 1013   CALCIUM 9.2 01/30/2014 1705   PROT 5.6 (L) 02/18/2022 1013   PROT 7.2 01/30/2014 1705   ALBUMIN 2.2 (L) 02/18/2022 1013   ALBUMIN 3.8 01/30/2014 1705   AST 25 02/18/2022 1013   AST 58 (H) 01/30/2014 1705   ALT 13 02/18/2022 1013   ALT 74 01/30/2014 1705   ALKPHOS 60 02/18/2022 1013   ALKPHOS 75 01/30/2014 1705   BILITOT 0.5 02/18/2022 1013   BILITOT 0.6 01/30/2014 1705   GFRNONAA >60 02/18/2022 1013   GFRNONAA >60 01/30/2014 1705   GFRAA >60 09/12/2016 0416   GFRAA >60 01/30/2014 1705     No results found.     Assessment & Plan:   1. Wound of foot The patient has had this wound for extended amount of time.  Based on this I think it would be best for the patient to go to wound care for further treatment and evaluation of this lower extremity wound.  He is agreeable. - Ambulatory referral to Wound Clinic  2. 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   Patient should undergo  injection sclerotherapy to treat the residual  varicosities.   The risks, benefits and alternative therapies were reviewed in detail with the patient.  All questions were answered.  The patient agrees to proceed with sclerotherapy at their convenience.   The patient will continue wearing the graduated compression stockings and using the over-the-counter pain medications to treat her symptoms.    Current Outpatient Medications on File Prior to Visit  Medication Sig Dispense Refill   acetaminophen (TYLENOL) 650 MG CR tablet Take 650 mg by mouth every 8 (eight) hours as needed for pain.     Ferrous Sulfate (IRON PO) Take 325 mg by mouth daily.     furosemide (LASIX) 40 MG tablet Take 20 mg by mouth daily.     gabapentin (NEURONTIN) 300 MG capsule Take 300 mg by mouth 3 (three) times daily.     losartan (COZAAR) 25 MG tablet Take 12.5 mg by mouth daily.     Multiple Vitamin (MULTIVITAMIN) tablet Take 1 tablet by mouth daily.     nitroGLYCERIN (NITROSTAT) 0.4 MG SL tablet Place under the tongue.     rivaroxaban (XARELTO) 20 MG TABS tablet Take 20 mg by mouth daily with supper.     rosuvastatin (CRESTOR) 10 MG tablet Take 10 mg by mouth daily.     sertraline (ZOLOFT) 100 MG tablet Take 1 tablet by mouth daily.     silver sulfADIAZINE (SILVADENE) 1 % cream Per instructions DAILY (route: topical)     spironolactone (ALDACTONE) 50 MG tablet Take 1 tablet (50 mg total) by mouth 2 (two) times daily. 60 tablet 6   tiotropium (SPIRIVA) 18 MCG inhalation capsule Place 18 mcg into inhaler and inhale daily.     levothyroxine (SYNTHROID) 175 MCG tablet Take 175 mcg by mouth daily before breakfast.     nitroGLYCERIN (NITROSTAT) 0.4 MG SL tablet Place under the tongue.     No current facility-administered medications on file prior to visit.    There are no Patient Instructions on file for this visit. No follow-ups on file.   Kris Hartmann, NP

## 2022-03-18 ENCOUNTER — Encounter: Payer: Medicare Other | Attending: Physician Assistant | Admitting: Physician Assistant

## 2022-03-18 ENCOUNTER — Telehealth (INDEPENDENT_AMBULATORY_CARE_PROVIDER_SITE_OTHER): Payer: Self-pay | Admitting: Vascular Surgery

## 2022-03-18 DIAGNOSIS — Z7901 Long term (current) use of anticoagulants: Secondary | ICD-10-CM | POA: Insufficient documentation

## 2022-03-18 DIAGNOSIS — I480 Paroxysmal atrial fibrillation: Secondary | ICD-10-CM | POA: Diagnosis not present

## 2022-03-18 DIAGNOSIS — I83015 Varicose veins of right lower extremity with ulcer other part of foot: Secondary | ICD-10-CM | POA: Insufficient documentation

## 2022-03-18 DIAGNOSIS — I83025 Varicose veins of left lower extremity with ulcer other part of foot: Secondary | ICD-10-CM | POA: Diagnosis not present

## 2022-03-18 DIAGNOSIS — L97312 Non-pressure chronic ulcer of right ankle with fat layer exposed: Secondary | ICD-10-CM | POA: Insufficient documentation

## 2022-03-18 DIAGNOSIS — Z87891 Personal history of nicotine dependence: Secondary | ICD-10-CM | POA: Insufficient documentation

## 2022-03-18 NOTE — Telephone Encounter (Signed)
Patient called wanting to get the process started for sclero.    Please call and advise

## 2022-03-21 NOTE — Progress Notes (Signed)
Louis Sanchez ([id number]) Visit Report for 03/18/2022 Chief Complaint Document Details Patient Name: Louis Sanchez Date of Service: 03/18/2022 8:45 AM Medical Record Number: [id number] Patient Account Number: [id number] Date of Birth/Sex: [birth year] (81 y.o. M) Treating RN: Carlene Coria Primary Care Provider: Ramonita Lab Other Clinician: Referring Provider: Eulogio Ditch Treating Provider/Extender: Skipper Cliche in Treatment: 0 Information Obtained from: Patient Chief Complaint Right ankle ulcer Electronic Signature(s) Signed: 03/18/2022 9:56:02 AM By: Worthy Keeler PA-C Entered By: Worthy Keeler on 03/18/2022 09:56:02 Louis Sanchez ([id number]) -------------------------------------------------------------------------------- HPI Details Patient Name: Louis Sanchez Date of Service: 03/18/2022 8:45 AM Medical Record Number: [id number] Patient Account Number: [id number] Date of Birth/Sex: April 26, 1941 (81 y.o. M) Treating RN: Carlene Coria Primary Care Provider: Ramonita Lab Other Clinician: Referring Provider: Eulogio Ditch Treating Provider/Extender: Skipper Cliche in Treatment: 0 History of Present Illness HPI Description: 03-18-2022 upon evaluation today patient presents for initial inspection here in the clinic concerning issues that he has been having with wounds over the legs. With that being said the only wound right now that actually seems to be open is actually in the ankle area. 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 With that being said I do see evidence of the need for some sclerotherapy and to be honest he was seen by vascular on 03-01-2022 where they recommended going ahead and initiating treatment with sclerotherapy for the spider veins of the foot due to the fact that he keeps having ruptures which caused significant bleeding that ended up with him going to the hospital in the ER several times. The patient is on Xarelto since  very hard to stop bleeding once it starts. With that being said currently he is not having anything actively bleeding in this regard which is great news the ankle ulcers along with still remaining of several areas that opened. That is on the right medial ankle. Of note he has been to be moving into assisted living in the next week. Electronic Signature(s) Signed: 03/18/2022 10:38:59 AM By: Worthy Keeler PA-C Entered By: Worthy Keeler on 03/18/2022 10:38:58 Louis Sanchez ([id number]) -------------------------------------------------------------------------------- Physical Exam Details Patient Name: Louis Sanchez Date of Service: 03/18/2022 8:45 AM Medical Record Number: [id number] Patient Account Number: [id number] Date of Birth/Sex: [birth year] (81 y.o. M) Treating RN: Carlene Coria Primary Care Provider: Ramonita Lab Other Clinician: Referring Provider: Eulogio Ditch Treating Provider/Extender: Skipper Cliche in Treatment: 0 Constitutional sitting or standing blood pressure is within target range for patient.. pulse regular and within target range for patient.Marland Kitchen respirations regular, non- labored and within target range for patient.Marland Kitchen temperature within target range for patient.. Well-nourished and well-hydrated in no acute distress. Eyes conjunctiva clear no eyelid edema noted. pupils equal round and reactive to light and accommodation. Ears, Nose, Mouth, and Throat no gross abnormality of ear auricles or external auditory canals. normal hearing noted during conversation. mucus membranes moist. Respiratory normal breathing without difficulty. Cardiovascular 1+ dorsalis pedis/posterior tibialis pulses. no clubbing, cyanosis, significant edema, <3 sec cap refill. Musculoskeletal normal gait and posture. no significant deformity or arthritic changes, no loss or range of motion, no clubbing. Psychiatric this patient is able to make decisions and demonstrates good insight into disease  process. Alert and Oriented x 3. pleasant and cooperative. Notes Upon inspection patient had multiple areas of dry crusting and scabbing noted at many locations. Actually did remove these carefully in order to determine what exactly was or was not going on.  The good news is there was only 1 open area on the medial ankle of the right leg and this did not appear to be doing too poorly to be honest. Fortunately it did not bleed when this was removed and everything else seems to be doing quite well. Electronic Signature(s) Signed: 03/18/2022 10:40:09 AM By: Worthy Keeler PA-C Entered By: Worthy Keeler on 03/18/2022 10:40:09 Louis Sanchez ([id number]) -------------------------------------------------------------------------------- Physician Orders Details Patient Name: Louis Sanchez Date of Service: 03/18/2022 8:45 AM Medical Record Number: [id number] Patient Account Number: [id number] Date of Birth/Sex: 07/22/1941 (81 y.o. M) Treating RN: Carlene Coria Primary Care Provider: Ramonita Lab Other Clinician: Referring Provider: Eulogio Ditch Treating Provider/Extender: Skipper Cliche in Treatment: 0 Verbal / Phone Orders: No Diagnosis Coding Follow-up Appointments o Return Appointment in 2 weeks. [place] for wound care. May utilize formulary equivalent dressing for wound treatment orders unless otherwise specified. Home Health Nurse may visit PRN to address patientos wound care needs. - ADORATION Bathing/ Shower/ Hygiene o May shower; gently cleanse wound with antibacterial soap, rinse and pat dry prior to dressing wounds Anesthetic (Use 'Patient Medications' Section for Anesthetic Order Entry) o Lidocaine applied to wound bed Edema Control - Lymphedema / Segmental Compressive Device / Other o Patient to wear own compression stockings. Remove compression stockings every night before going to bed and put on every morning when getting up. o Patient  to wear own Velcro compression garment. Remove compression stockings every night before going to bed and put on every morning when getting up. o Elevate, Exercise Daily and Avoid Standing for Long Periods of Time. o Elevate legs to the level of the heart and pump ankles as often as possible Wound Treatment Wound #1 - Ankle Wound Laterality: Right, Medial Cleanser: Soap and Water 3 x Per Week/30 Days Discharge Instructions: Gently cleanse wound with antibacterial soap, rinse and pat dry prior to dressing wounds Primary Dressing: Xeroform 4x4-HBD (in/in) 3 x Per Week/30 Days Discharge Instructions: Apply Xeroform 4x4-HBD (in/in) as directed Secondary Dressing: Coverlet Latex-Free Fabric Adhesive Dressings 3 x Per Week/30 Days Discharge Instructions: Knuckle Electronic Signature(s) Signed: 03/18/2022 12:36:15 PM By: Worthy Keeler PA-C Signed: 03/21/2022 4:28:34 PM By: Carlene Coria RN Entered By: Carlene Coria on 03/18/2022 09:45:36 Louis Sanchez ([id number]) -------------------------------------------------------------------------------- Problem List Details Patient Name: Louis Sanchez Date of Service: 03/18/2022 8:45 AM Medical Record Number: [id number] Patient Account Number: [id number] Date of Birth/Sex: [birth year] (80 y.o. 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 03/18/2022 No Yes L97.312 Non-pressure chronic ulcer of right ankle with fat layer exposed 03/18/2022 No Yes I48.0 Paroxysmal atrial fibrillation 03/18/2022 No Yes Z79.01 Long term (current) use of anticoagulants 03/18/2022 No Yes Inactive  Problems Resolved Problems Electronic Signature(s) Signed: 03/18/2022 10:37:53 AM By: Worthy Keeler PA-C Previous Signature: 03/18/2022 9:55:52 AM Version By: Worthy Keeler PA-C Entered By: Worthy Keeler on 03/18/2022 10:37:53 Louis Sanchez ([id number]) -------------------------------------------------------------------------------- Progress Note Details Patient Name: Louis Sanchez Date of Service: 03/18/2022 8:45 AM Medical Record Number: [id number] Patient Account Number: [id number] Date of Birth/Sex: 20-Jul-1941 (80 y.o. M) Treating RN: Carlene Coria Primary Care Provider: Ramonita Lab  Other Clinician: Referring Provider: Eulogio Ditch Treating Provider/Extender: Skipper Cliche in Treatment: 0 Subjective Chief Complaint Information obtained from Patient Right ankle ulcer History of Present Illness (HPI) 03-18-2022 upon evaluation today patient presents for initial inspection here in the clinic concerning issues that he has been having with wounds over the legs. With that being said the only wound right now that actually seems to be open is actually in the ankle area. 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 With that being said I do see evidence of the need for some sclerotherapy and to be honest he was seen by vascular on 03-01-2022 where they recommended going ahead and initiating treatment with sclerotherapy for the spider veins of the foot due to the fact that he keeps having ruptures which caused significant bleeding that ended up with him going to the hospital in the ER several times. The patient is on Xarelto since very hard to stop bleeding once it starts. With that being said currently he is not having anything actively bleeding in this regard which is great news the ankle ulcers along with still remaining of several areas that opened. That is on the right medial ankle. Of note he has been to be moving into assisted living in the next  week. Patient History Allergies No Known Allergies Social History Former smoker, Marital Status - Married, Alcohol Use - Never, Drug Use - No History, Caffeine Use - Daily. Medical History Cardiovascular Patient has history of Congestive Heart Failure, Myocardial Infarction Oncologic Patient has history of Received Chemotherapy Review of Systems (ROS) Integumentary (Skin) Complains or has symptoms of Wounds. Oncologic liver lung Objective Constitutional sitting or standing blood pressure is within target range for patient.. pulse regular and within target range for patient.Marland Kitchen respirations regular, non- labored and within target range for patient.Marland Kitchen temperature within target range for patient.. Well-nourished and well-hydrated in no acute distress. Vitals Time Taken: 9:16 AM, Height: 66 in, Source: Stated, Weight: 134 lbs, BMI: 21.6, Temperature: 97.7 F, Pulse: 78 bpm, Respiratory Rate: 18 breaths/min, Blood Pressure: 104/60 mmHg. Eyes conjunctiva clear no eyelid edema noted. pupils equal round and reactive to light and accommodation. Ears, Nose, Mouth, and Throat no gross abnormality of ear auricles or external auditory canals. normal hearing noted during conversation. mucus membranes moist. Respiratory normal breathing without difficulty. Cardiovascular [place], Kyshaun C. ([id number]) 1+ dorsalis pedis/posterior tibialis pulses. no clubbing, cyanosis, significant edema, Musculoskeletal normal gait and posture. no significant deformity or arthritic changes, no loss or range of motion, no clubbing. Psychiatric this patient is able to make decisions and demonstrates good insight into disease process. Alert and Oriented x 3. pleasant and cooperative. General Notes: Upon inspection patient had multiple areas of dry crusting and scabbing noted at many locations. Actually did remove these carefully in order to determine what exactly was or was not going on. The good news is there was only 1  open area on the medial ankle of the right leg and this did not appear to be doing too poorly to be honest. Fortunately it did not bleed when this was removed and everything else seems to be doing quite well. Integumentary (Hair, Skin) Wound #1 status is Open. Original cause of wound was Trauma. The date acquired was: 02/23/2022. The wound is located on the Right,Medial Ankle. The wound measures 0.5cm length x 1cm width x 0.1cm depth; 0.393cm^2 area and 0.039cm^3 volume. There is Fat Layer (Subcutaneous Tissue) exposed. There is no tunneling or undermining noted.  There is a medium amount of serosanguineous drainage noted. There is medium (34-66%) pink granulation within the wound bed. There is a medium (34-66%) amount of necrotic tissue within the wound bed including Adherent Slough. 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 Home Health: [facility] for [facility] care. May utilize formulary equivalent dressing for wound treatment orders unless otherwise specified. Home Health Nurse may visit PRN to address patient s wound care needs. - ADORATION Bathing/ Shower/ Hygiene: May shower; gently cleanse wound with antibacterial soap, rinse and pat dry prior to dressing wounds Anesthetic (Use 'Patient Medications' Section for Anesthetic Order Entry): Lidocaine applied to wound bed Edema Control - Lymphedema / Segmental Compressive Device / Other: Patient to wear own compression stockings. Remove compression stockings every night before going to bed and put on every morning when getting up. Patient to wear own Velcro compression garment. Remove compression stockings  every night before going to bed and put on every morning when getting up. Elevate, Exercise Daily and Avoid Standing for Long Periods of Time. Elevate legs to the level of the heart and pump ankles as often as possible WOUND #1: - Ankle Wound Laterality: Right, Medial Cleanser: Soap and Water 3 x Per Week/30 Days Discharge Instructions: Gently cleanse wound with antibacterial soap, rinse and pat dry prior to dressing wounds Primary Dressing: Xeroform 4x4-HBD (in/in) 3 x Per Week/30 Days Discharge Instructions: Apply Xeroform 4x4-HBD (in/in) as directed Secondary Dressing: Coverlet Latex-Free Fabric Adhesive Dressings 3 x Per Week/30 Days Discharge Instructions: Knuckle 1. I would recommend currently that we go ahead and initiate treatment with a continuation of the Xeroform gauze to the right medial ankle I think this is good to be the best way to go. 2. I am also can recommend just a Band-Aid to cover. 3. I would suggest patient go ahead and schedule with vein and vascular here in town at [facility] vein [facility] vascular to have the sclerotherapy this and redo that the better as he keeps having significant issues with bleeding due to rupture of spider veins this caused him to have to go to the hospital frequently. This is definitely not what we want to see happen. We will see patient back for reevaluation in 2 weeks here in the clinic. If anything worsens or changes patient will contact our office for additional recommendations. MUHSIN, DORIS ([id number]) Electronic Signature(s) Signed: 03/18/2022 10:41:14 AM By: Worthy Keeler PA-C Entered By: Worthy Keeler on 03/18/2022 10:41:13 Louis Sanchez ([id number]) -------------------------------------------------------------------------------- ROS/PFSH Details Patient Name: Louis Sanchez Date of Service: 03/18/2022 8:45 AM Medical Record Number: [id number] Patient Account Number: [id number] Date of Birth/Sex: 12-20-1940 (80 y.o. M) Treating RN:  Carlene Coria Primary Care Provider: Ramonita Lab Other Clinician: Referring Provider: Eulogio Ditch Treating Provider/Extender: Skipper Cliche in Treatment: 0 Integumentary (Skin) Complaints and Symptoms: Positive for: Wounds Cardiovascular Medical History: Positive for: Congestive Heart Failure; Myocardial Infarction Oncologic Complaints and Symptoms: Review of System Notes: liver lung Medical History: Positive for: Received Chemotherapy Immunizations Pneumococcal Vaccine: Received Pneumococcal Vaccination: No Implantable Devices None Family and Social History Former smoker; Marital Status - Married; Alcohol Use: Never; Drug Use: No History; Caffeine Use: Daily Electronic Signature(s) Signed: 03/18/2022 12:36:15 PM By: Joaquim Lai  Dixie Dials PA-C Signed: 03/21/2022 4:28:34 PM By: Carlene Coria RN Entered By: Carlene Coria on 03/18/2022 09:01:17 Louis Sanchez ([id number]) -------------------------------------------------------------------------------- SuperBill Details Patient Name: Louis Sanchez Date of Service: 03/18/2022 Medical Record Number: [id number] Patient Account Number: [id number] Date of Birth/Sex: [birth year] (80 y.o. 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 I48.0 Paroxysmal atrial fibrillation Z79.01 Long term (current) use of anticoagulants Facility Procedures CPT4 Code: [id number] Description: 99213 - WOUND CARE VISIT-LEV 3 EST PT Modifier: Quantity: 1 Physician Procedures CPT4 Code: 1950932 Description: WC PHYS LEVEL 3 o NEW PT Modifier: Quantity:  1 CPT4 Code: Description: ICD-10 Diagnosis Description 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

## 2022-03-21 NOTE — Progress Notes (Signed)
Louis Sanchez, Louis Sanchez ([id number]) Visit Report for 03/18/2022 Abuse Risk Screen Details Patient Name: Louis Sanchez, Louis Sanchez Date of Service: 03/18/2022 8:45 AM Medical Record Number: [id number] Patient Account Number: [id number] Date of Birth/Sex: [birth year] (80 y.o. M) Treating RN: Carlene Coria Primary Care Tynetta Bachmann: Ramonita Lab Other Clinician: Referring Nalin Mazzocco: Eulogio Ditch Treating Kasra Melvin/Extender: Skipper Cliche in Treatment: 0 Abuse Risk Screen Items Answer ABUSE RISK SCREEN: Has anyone close to you tried to hurt or harm you recentlyo No Do you feel uncomfortable with anyone in your familyo No Has anyone forced you do things that you didnot want to doo No Electronic Signature(s) Signed: 03/21/2022 4:28:34 PM By: Carlene Coria RN Entered By: Carlene Coria on 03/18/2022 09:01:24 Louis Sanchez ([id number]) -------------------------------------------------------------------------------- Activities of Daily Living Details Patient Name: Louis Sanchez Date of Service: 03/18/2022 8:45 AM Medical Record Number: [id number] Patient Account Number: [id number] Date of Birth/Sex: 1941-07-05 (80 y.o. M) Treating RN: Carlene Coria Primary Care Rubyann Lingle: Ramonita Lab Other Clinician: Referring Melik Blancett: Eulogio Ditch Treating Calyn Sivils/Extender: Skipper Cliche in Treatment: 0 Activities of Daily Living Items Answer Activities of Daily Living (Please select one for each item) Drive Automobile Not Able Take Medications Completely Able Use Telephone Completely Able Care for Appearance Completely Able Use Toilet Completely Able Bath / Shower Completely Able Dress Self Completely Able Feed Self Completely Able Walk Completely Able Get In / Out Bed Completely Able Housework Completely Able Prepare Meals Completely [place] for Self Completely Able Electronic Signature(s) Signed: 03/21/2022 4:28:34 PM By: Carlene Coria RN Entered By: Carlene Coria on 03/18/2022  09:02:17 Louis Sanchez ([id number]) -------------------------------------------------------------------------------- Education Screening Details Patient Name: Louis Sanchez Date of Service: 03/18/2022 8:45 AM Medical Record Number: [id number] Patient Account Number: [id number] Date of Birth/Sex: 18-Dec-1940 (80 y.o. M) Treating RN: Carlene Coria Primary Care Elidia Bonenfant: Ramonita Lab Other Clinician: Referring Tulio Facundo: Eulogio Ditch Treating Artesha Wemhoff/Extender: Skipper Cliche in Treatment: 0 Primary Learner Assessed: Patient Learning Preferences/Education Level/Primary Language Learning Preference: Explanation Highest Education Level: College or Above Preferred Language: English Cognitive Barrier Language Barrier: No Translator Needed: No Memory Deficit: No Emotional Barrier: No Cultural/Religious Beliefs Affecting Medical Care: No Physical Barrier Impaired Vision: Yes Glasses Impaired Hearing: No Decreased Hand dexterity: No Knowledge/Comprehension Knowledge Level: Medium Comprehension Level: High Ability to understand written instructions: High Ability to understand verbal instructions: High Motivation Anxiety Level: Anxious Cooperation: Cooperative Education Importance: Acknowledges Need Interest in Health Problems: Asks Questions Perception: Coherent Willingness to Engage in Self-Management High Activities: Readiness to Engage in Self-Management High Activities: Electronic Signature(s) Signed: 03/21/2022 4:28:34 PM By: Carlene Coria RN Entered By: Carlene Coria on 03/18/2022 09:02:56 Louis Sanchez ([id number]) -------------------------------------------------------------------------------- Fall Risk Assessment Details Patient Name: Louis Sanchez Date of Service: 03/18/2022 8:45 AM Medical Record Number: [id number] Patient Account Number: [id number] Date of Birth/Sex: [birth year] (80 y.o. M) Treating RN: Carlene Coria Primary Care Benedetto Ryder: Ramonita Lab Other  Clinician: Referring Jamilia Jacques: Eulogio Ditch Treating Raydin Bielinski/Extender: Skipper Cliche in Treatment: 0 Fall Risk Assessment Items Have you had 2 or more falls in the last 12 monthso 0 No Have you had any fall that resulted in injury in the last 12 monthso 0 No FALLS RISK SCREEN History of falling - immediate or within 3 months 0 No Secondary diagnosis (Do you have 2 or more medical diagnoseso) 0 No Ambulatory aid None/bed rest/wheelchair/nurse 0 No Crutches/cane/walker 0 No Furniture 0 No Intravenous therapy Access/Saline/Heparin Lock 0 No Gait/Transferring Normal/ bed rest/ wheelchair 0 No Weak (short steps  with or without shuffle, stooped but able to lift head while walking, may 0 No seek support from furniture) Impaired (short steps with shuffle, may have difficulty arising from chair, head down, impaired 0 No balance) Mental Status Oriented to own ability 0 No Electronic Signature(s) Signed: 03/21/2022 4:28:34 PM By: Carlene Coria RN Entered By: Carlene Coria on 03/18/2022 09:03:19 Louis Sanchez ([id number]) -------------------------------------------------------------------------------- Foot Assessment Details Patient Name: Louis Sanchez Date of Service: 03/18/2022 8:45 AM Medical Record Number: [id number] Patient Account Number: [id number] Date of Birth/Sex: 05/18/1941 (80 y.o. M) Treating RN: Carlene Coria Primary Care Laurajean Hosek: Ramonita Lab Other Clinician: Referring Dayne Dekay: Eulogio Ditch Treating Casidy Alberta/Extender: Skipper Cliche in Treatment: 0 Foot Assessment Items Site Locations + = Sensation present, - = Sensation absent, C = Callus, U = Ulcer R = Redness, W = Warmth, M = Maceration, PU = Pre-ulcerative lesion F = Fissure, S = Swelling, D = Dryness Assessment Right: Left: Other Deformity: No No Prior Foot Ulcer: No No Prior Amputation: No No Charcot Joint: No No Ambulatory Status: Ambulatory Without Help Gait: Steady Electronic  Signature(s) Signed: 03/21/2022 4:28:34 PM By: Carlene Coria RN Entered By: Carlene Coria on 03/18/2022 09:14:07 Louis Sanchez ([id number]) -------------------------------------------------------------------------------- Nutrition Risk Screening Details Patient Name: Louis Sanchez Date of Service: 03/18/2022 8:45 AM Medical Record Number: [id number] Patient Account Number: [id number] Date of Birth/Sex: 03/12/1941 (80 y.o. M) Treating RN: Carlene Coria Primary Care Terra Aveni: Ramonita Lab Other Clinician: Referring Ravneet Spilker: Eulogio Ditch Treating Mabrey Howland/Extender: Jeri Cos Weeks in Treatment: 0 Height (in): Weight (lbs): Body Mass Index (BMI): Nutrition Risk Screening Items Score Screening NUTRITION RISK SCREEN: I have an illness or condition that made me change the kind and/or amount of food I eat 0 No I eat fewer than two meals per day 0 No I eat few fruits and vegetables, or milk products 0 No I have three or more drinks of beer, liquor or wine almost every day 0 No I have tooth or mouth problems that make it hard for me to eat 0 No I don't always have enough money to buy the food I need 0 No I eat alone most of the time 0 No I take three or more different prescribed or over-the-counter drugs a day 1 Yes Without wanting to, I have lost or gained 10 pounds in the last six months 0 No I am not always physically able to shop, cook and/or feed myself 0 No Nutrition Protocols Good Risk Protocol 0 No interventions needed Moderate Risk Protocol High Risk Proctocol Risk Level: Good Risk Score: 1 Electronic Signature(s) Signed: 03/21/2022 4:28:34 PM By: Carlene Coria RN Entered By: Carlene Coria on 03/18/2022 09:03:55

## 2022-03-21 NOTE — Progress Notes (Signed)
IDAN, PRIME ([id number]) Visit Report for 03/18/2022 Allergy List Details Patient Name: Louis Sanchez, Louis Sanchez Date of Service: 03/18/2022 8:45 AM Medical Record Number: [id number] Patient Account Number: [id number] Date of Birth/Sex: [birth year] (80 y.o. M) Treating RN: Carlene Coria Primary Care Taylan Marez: Ramonita Lab Other Clinician: Referring Lashaun Poch: Eulogio Ditch Treating Mardi Cannady/Extender: Jeri Cos Weeks in Treatment: 0 Allergies Active Allergies No Known Allergies Allergy Notes Electronic Signature(s) Signed: 03/21/2022 4:28:34 PM By: Carlene Coria RN Entered By: Carlene Coria on 03/18/2022 08:56:19 Louis Sanchez ([id number]) -------------------------------------------------------------------------------- Arrival Information Details Patient Name: Louis Sanchez Date of Service: 03/18/2022 8:45 AM Medical Record Number: [id number] Patient Account Number: [id number] Date of Birth/Sex: 1941-05-31 (80 y.o. M) Treating RN: Carlene Coria Primary Care Falynn Ailey: Ramonita Lab Other Clinician: Referring Daisha Filosa: Eulogio Ditch Treating Rajean Desantiago/Extender: Skipper Cliche in Treatment: 0 Visit Information Patient Arrived: Louis Sanchez Arrival Time: 09:15 Accompanied By: caregiver Transfer Assistance: None Patient Identification Verified: Yes Secondary Verification Process Completed: Yes Patient Requires Transmission-Based Precautions: No Patient Has Alerts: No Electronic Signature(s) Signed: 03/21/2022 4:28:34 PM By: Carlene Coria RN Entered By: Carlene Coria on 03/18/2022 09:16:17 Louis Sanchez ([id number]) -------------------------------------------------------------------------------- Clinic Level of Care Assessment Details Patient Name: Louis Sanchez Date of Service: 03/18/2022 8:45 AM Medical Record Number: [id number] Patient Account Number: [id number] Date of Birth/Sex: [birth year] (80 y.o. M) Treating RN: Carlene Coria Primary Care Yanina Knupp: Ramonita Lab Other Clinician: Referring  Reannah Totten: Eulogio Ditch Treating Tiburcio Linder/Extender: Skipper Cliche in Treatment: 0 Clinic Level of Care Assessment Items TOOL 4 Quantity Score X - Use when only an EandM is performed on FOLLOW-UP visit 1 0 ASSESSMENTS - Nursing Assessment / Reassessment X - Reassessment of Co-morbidities (includes updates in patient status) 1 10 X- 1 5 Reassessment of Adherence to Treatment Plan ASSESSMENTS - Wound and Skin Assessment / Reassessment X - Simple Wound Assessment / Reassessment - one wound 1 5 []  - 0 Complex Wound Assessment / Reassessment - multiple wounds []  - 0 Dermatologic / Skin Assessment (not related to wound area) ASSESSMENTS - Focused Assessment []  - Circumferential Edema Measurements - multi extremities 0 []  - 0 Nutritional Assessment / Counseling / Intervention []  - 0 Lower Extremity Assessment (monofilament, tuning fork, pulses) []  - 0 Peripheral Arterial Disease Assessment (using hand held doppler) ASSESSMENTS - Ostomy and/or Continence Assessment and Care []  - Incontinence Assessment and Management 0 []  - 0 Ostomy Care Assessment and Management (repouching, etc.) PROCESS - Coordination of Care X - Simple Patient / Family Education for ongoing care 1 15 []  - 0 Complex (extensive) Patient / Family Education for ongoing care X- 1 10 Staff obtains Programmer, systems, Records, Test Results / Process Orders []  - 0 Staff telephones HHA, Nursing Homes / Clarify orders / etc []  - 0 Routine Transfer to another Facility (non-emergent condition) []  - 0 Routine Hospital Admission (non-emergent condition) []  - 0 New Admissions / Biomedical engineer / Ordering NPWT, Apligraf, etc. []  - 0 Emergency Hospital Admission (emergent condition) X- 1 10 Simple Discharge Coordination []  - 0 Complex (extensive) Discharge Coordination PROCESS - Special Needs []  - Pediatric / Minor Patient Management 0 []  - 0 Isolation Patient Management []  - 0 Hearing / Language / Visual special  needs []  - 0 Assessment of Community assistance (transportation, D/C planning, etc.) []  - 0 Additional assistance / Altered mentation []  - 0 Support Surface(s) Assessment (bed, cushion, seat, etc.) INTERVENTIONS - Wound Cleansing / Measurement Louis Sanchez, Louis C. ([id number]) X- 1 5 Simple Wound Cleansing - one wound []  - 0  Complex Wound Cleansing - multiple wounds X- 1 5 Wound Imaging (photographs - any number of wounds) []  - 0 Wound Tracing (instead of photographs) X- 1 5 Simple Wound Measurement - one wound []  - 0 Complex Wound Measurement - multiple wounds INTERVENTIONS - Wound Dressings X - Small Wound Dressing one or multiple wounds 1 10 []  - 0 Medium Wound Dressing one or multiple wounds []  - 0 Large Wound Dressing one or multiple wounds []  - 0 Application of Medications - topical []  - 0 Application of Medications - injection INTERVENTIONS - Miscellaneous []  - External ear exam 0 []  - 0 Specimen Collection (cultures, biopsies, blood, body fluids, etc.) []  - 0 Specimen(s) / Culture(s) sent or taken to Lab for analysis []  - 0 Patient Transfer (multiple staff / Civil Service fast streamer / Similar devices) []  - 0 Simple Staple / Suture removal (25 or less) []  - 0 Complex Staple / Suture removal (26 or more) []  - 0 Hypo / Hyperglycemic Management (close monitor of Blood Glucose) []  - 0 Ankle / Brachial Index (ABI) - do not check if billed separately X- 1 5 Vital Signs Has the patient been seen at the hospital within the last three years: Yes Total Score: 85 Level Of Care: New/Established - Level 3 Electronic Signature(s) Signed: 03/21/2022 4:28:34 PM By: Carlene Coria RN Entered By: Carlene Coria on 03/18/2022 09:46:23 Louis Sanchez ([id number]) -------------------------------------------------------------------------------- Encounter Discharge Information Details Patient Name: Louis Sanchez Date of Service: 03/18/2022 8:45 AM Medical Record Number: [id number] Patient  Account Number: [id number] Date of Birth/Sex: 07/09/1941 (80 y.o. M) Treating RN: Carlene Coria Primary Care Shilynn Hoch: Ramonita Lab Other Clinician: Referring Dontrail Blackwell: Eulogio Ditch Treating Adrinne Sze/Extender: Skipper Cliche in Treatment: 0 Encounter Discharge Information Items Discharge Condition: Stable Ambulatory Status: Walker Discharge Destination: Home Transportation: Private Auto Accompanied By: caregiver Schedule Follow-up Appointment: Yes Clinical Summary of Care: Electronic Signature(s) Signed: 03/21/2022 4:28:34 PM By: Carlene Coria RN Entered By: Carlene Coria on 03/18/2022 09:53:19 Louis Sanchez ([id number]) -------------------------------------------------------------------------------- Lower Extremity Assessment Details Patient Name: Louis Sanchez Date of Service: 03/18/2022 8:45 AM Medical Record Number: [id number] Patient Account Number: [id number] Date of Birth/Sex: 1941-07-07 (80 y.o. M) Treating RN: Carlene Coria Primary Care Kylen Ismael: Ramonita Lab Other Clinician: Referring Keiondre Colee: Eulogio Ditch Treating Missie Gehrig/Extender: Jeri Cos Weeks in Treatment: 0 Electronic Signature(s) Signed: 03/21/2022 4:28:34 PM By: Carlene Coria RN Entered By: Carlene Coria on 03/18/2022 09:17:14 Louis Sanchez ([id number]) -------------------------------------------------------------------------------- Multi Wound Chart Details Patient Name: Louis Sanchez Date of Service: 03/18/2022 8:45 AM Medical Record Number: [id number] Patient Account Number: [id number] Date of Birth/Sex: 10-Apr-1941 (80 y.o. M) Treating RN: Carlene Coria Primary Care Meer Reindl: Ramonita Lab Other Clinician: Referring Ciella Obi: Eulogio Ditch Treating Seila Liston/Extender: Jeri Cos Weeks in Treatment: 0 Wound Assessments Treatment Notes Electronic Signature(s) Signed: 03/21/2022 4:28:34 PM By: Carlene Coria RN Entered By: Carlene Coria on 03/18/2022 09:15:31 Louis Sanchez  ([id number]) -------------------------------------------------------------------------------- Multi-Disciplinary Care Plan Details Patient Name: Louis Sanchez Date of Service: 03/18/2022 8:45 AM Medical Record Number: [id number] Patient Account Number: [id number] Date of Birth/Sex: [birth year] (80 y.o. M) Treating RN: Carlene Coria Primary Care Koree Schopf: Ramonita Lab Other Clinician: Referring Chante Mayson: Eulogio Ditch Treating Sahej Hauswirth/Extender: Skipper Cliche in Treatment: 0 Active Inactive Wound/Skin Impairment Nursing Diagnoses: Knowledge deficit related to ulceration/compromised skin integrity Goals: Patient/caregiver will verbalize understanding of skin care regimen Date Initiated: 03/18/2022 Target Resolution Date: 03/18/2022 Goal Status: Active Interventions: Assess patient/caregiver ability to obtain necessary supplies Assess patient/caregiver ability to perform ulcer/skin care regimen upon  admission and as needed Assess ulceration(s) every visit Notes: Electronic Signature(s) Signed: 03/21/2022 4:28:34 PM By: Carlene Coria RN Entered By: Carlene Coria on 03/18/2022 09:14:58 Louis Sanchez ([id number]) -------------------------------------------------------------------------------- Pain Assessment Details Patient Name: Louis Sanchez Date of Service: 03/18/2022 8:45 AM Medical Record Number: [id number] Patient Account Number: [id number] Date of Birth/Sex: 1940/09/29 (80 y.o. M) Treating RN: Carlene Coria Primary Care Pam Vanalstine: Ramonita Lab Other Clinician: Referring Arella Blinder: Eulogio Ditch Treating Louis Sanchez/Extender: Skipper Cliche in Treatment: 0 Active Problems Location of Pain Severity and Description of Pain Patient Has Paino No Site Locations Pain Management and Medication Current Pain Management: Electronic Signature(s) Signed: 03/21/2022 4:28:34 PM By: Carlene Coria RN Entered By: Carlene Coria on 03/18/2022 09:16:22 Louis Sanchez  ([id number]) -------------------------------------------------------------------------------- Patient/Caregiver Education Details Patient Name: Louis Sanchez Date of Service: 03/18/2022 8:45 AM Medical Record Number: [id number] Patient Account Number: [id number] Date of Birth/Gender: 1941/01/06 (80 y.o. M) Treating RN: Carlene Coria Primary Care Physician: Ramonita Lab Other Clinician: Referring Physician: Eulogio Ditch Treating Physician/Extender: Skipper Cliche in Treatment: 0 Education Assessment Education Provided To: Patient Education Topics Provided Wound/Skin Impairment: Methods: Explain/Verbal Responses: State content correctly Electronic Signature(s) Signed: 03/21/2022 4:28:34 PM By: Carlene Coria RN Entered By: Carlene Coria on 03/18/2022 09:46:36 Louis Sanchez ([id number]) -------------------------------------------------------------------------------- Wound Assessment Details Patient Name: Louis Sanchez Date of Service: 03/18/2022 8:45 AM Medical Record Number: [id number] Patient Account Number: [id number] Date of Birth/Sex: [birth year] (80 y.o. M) Treating RN: Carlene Coria Primary Care Coltin Casher: Ramonita Lab Other Clinician: Referring Sherwood Castilla: Eulogio Ditch Treating Kamiya Acord/Extender: Jeri Cos Weeks in Treatment: 0 Wound Status Wound Number: 1 Primary Venous Leg Ulcer Etiology: Wound Location: Right, Medial Ankle Wound Status: Open Wounding Event: Trauma Comorbid Congestive Heart Failure, Myocardial Infarction, Date Acquired: 02/23/2022 History: Received Chemotherapy Weeks Of Treatment: 0 Clustered Wound: No Photos Wound Measurements Length: (cm) 0.5 Width: (cm) 1 Depth: (cm) 0.1 Area: (cm) 0.393 Volume: (cm) 0.039 % Reduction in Area: % Reduction in Volume: Epithelialization: None Tunneling: No Undermining: No Wound Description Classification: Full Thickness Without Exposed Support Structures Exudate Amount: Medium Exudate Type:  Serosanguineous Exudate Color: red, brown Foul Odor After Cleansing: No Slough/Fibrino Yes Wound Bed Granulation Amount: Medium (34-66%) Exposed Structure Granulation Quality: Pink Fascia Exposed: No Necrotic Amount: Medium (34-66%) Fat Layer (Subcutaneous Tissue) Exposed: Yes Necrotic Quality: Adherent Slough Tendon Exposed: No Muscle Exposed: No Joint Exposed: No Bone Exposed: No Treatment Notes Wound #1 (Ankle) Wound Laterality: Right, Medial Cleanser Soap and Water Discharge Instruction: Gently cleanse wound with antibacterial soap, rinse and pat dry prior to dressing wounds Peri-Wound Care Louis Sanchez, Louis Sanchez ([id number]) Topical Primary Dressing Xeroform 4x4-HBD (in/in) Discharge Instruction: Apply Xeroform 4x4-HBD (in/in) as directed Secondary Dressing Coverlet Latex-Free Fabric Adhesive Dressings Discharge Instruction: Knuckle Secured With Compression Wrap Compression Stockings Add-Ons Electronic Signature(s) Signed: 03/21/2022 4:28:34 PM By: Carlene Coria RN Entered By: Carlene Coria on 03/18/2022 09:43:20 Louis Sanchez ([id number]) -------------------------------------------------------------------------------- Vitals Details Patient Name: Louis Sanchez Date of Service: 03/18/2022 8:45 AM Medical Record Number: [id number] Patient Account Number: [id number] Date of Birth/Sex: 1941/01/06 (80 y.o. M) Treating RN: Carlene Coria Primary Care Dung Salinger: Ramonita Lab Other Clinician: Referring Louis Sanchez: Eulogio Ditch Treating Shaniya Tashiro/Extender: Skipper Cliche in Treatment: 0 Vital Signs Time Taken: 09:16 Temperature (F): 97.7 Height (in): 66 Pulse (bpm): 78 Source: Stated Respiratory Rate (breaths/min): 18 Weight (lbs): 134 Blood Pressure (mmHg): 104/60 Body Mass Index (BMI): 21.6 Reference Range: 80 - 120 mg / dl Electronic Signature(s) Signed: 03/21/2022 4:28:34 PM By: Carlene Coria RN  Entered By: Carlene Coria on 03/18/2022 09:17:04

## 2022-03-30 NOTE — Telephone Encounter (Signed)
LVM for pt TCB and schedule sclero appt  bilateral SALINE sclero. no auth req. see FB X 3

## 2022-03-31 ENCOUNTER — Emergency Department
Admission: EM | Admit: 2022-03-31 | Discharge: 2022-03-31 | Disposition: A | Discharge status: Skilled Nursing Facility | Payer: Medicare Other | Attending: Emergency Medicine | Admitting: Emergency Medicine

## 2022-03-31 ENCOUNTER — Emergency Department: Payer: Medicare Other

## 2022-03-31 DIAGNOSIS — S0990XA Unspecified injury of head, initial encounter: Secondary | ICD-10-CM | POA: Diagnosis present

## 2022-03-31 DIAGNOSIS — S0101XA Laceration without foreign body of scalp, initial encounter: Secondary | ICD-10-CM | POA: Diagnosis not present

## 2022-03-31 DIAGNOSIS — W19XXXA Unspecified fall, initial encounter: Secondary | ICD-10-CM

## 2022-03-31 DIAGNOSIS — Y92129 Unspecified place in nursing home as the place of occurrence of the external cause: Secondary | ICD-10-CM | POA: Insufficient documentation

## 2022-03-31 DIAGNOSIS — Z7901 Long term (current) use of anticoagulants: Secondary | ICD-10-CM | POA: Diagnosis not present

## 2022-03-31 DIAGNOSIS — W01198A Fall on same level from slipping, tripping and stumbling with subsequent striking against other object, initial encounter: Secondary | ICD-10-CM | POA: Insufficient documentation

## 2022-03-31 DIAGNOSIS — I2510 Atherosclerotic heart disease of native coronary artery without angina pectoris: Secondary | ICD-10-CM | POA: Insufficient documentation

## 2022-03-31 DIAGNOSIS — I10 Essential (primary) hypertension: Secondary | ICD-10-CM | POA: Diagnosis not present

## 2022-03-31 MED ORDER — LIDOCAINE-EPINEPHRINE 2 %-1:100000 IJ SOLN
10.0000 mL | Freq: Once | INTRAMUSCULAR | Status: AC
  Administered 2022-03-31: 10 mL
  Filled 2022-03-31: qty 1

## 2022-03-31 NOTE — ED Provider Notes (Signed)
[facility] Provider Note    Event Date/Time   First MD Initiated Contact with Patient 03/31/22 2127     (approximate)   History   Chief Complaint Fall and Head Injury   HPI  Louis Sanchez is a 81 y.o. male with past medical history of hypertension, CAD, and anemia who presents to the ED following fall.  Patient reports that he was bending over to pick something up when he lost his balance and fell forward, striking his head.  He did not lose consciousness, complains of mild headache but otherwise states he feels well.  He is anticoagulated on Xarelto.  He denies any neck pain or extremity pain.     Physical Exam   Triage Vital Signs: ED Triage Vitals [03/31/22 2132]  Enc Vitals Group     BP 94/62     Pulse Rate 65     Resp 18     Temp 97.8 F (36.6 C)     Temp src      SpO2 99 %     Weight      Height      Head Circumference      Peak Flow      Pain Score      Pain Loc      Pain Edu?      Excl. in [place]?     Most recent vital signs: Vitals:   03/31/22 2200 03/31/22 2300  BP: (!) 101/55 (!) 95/59  Pulse: 64 67  Resp: 18 18  Temp:    SpO2: 97% 96%    Constitutional: Alert and oriented. Eyes: Conjunctivae are normal. Head: Approximately 2 cm laceration to frontal scalp with ragged edges. Nose: No congestion/rhinnorhea. Mouth/Throat: Mucous membranes are moist.  Neck: No midline cervical spine tenderness to palpation. Cardiovascular: Normal rate, regular rhythm. Grossly normal heart sounds.  2+ radial pulses bilaterally. Respiratory: Normal respiratory effort.  No retractions. Lungs CTAB. Gastrointestinal: Soft and nontender. No distention. Musculoskeletal: No lower extremity tenderness nor edema.  No upper extremity bony tenderness to palpation. Neurologic:  Normal speech and language. No gross focal neurologic deficits are appreciated.    ED Results / Procedures / Treatments   Labs (all labs ordered are listed, but only  abnormal results are displayed) Labs Reviewed - No data to display  RADIOLOGY CT head reviewed and interpreted by me with no hemorrhage or midline shift.  CT cervical spine reviewed and interpreted by me with no fracture or dislocation.  PROCEDURES:  Critical Care performed: No  ..Laceration Repair  Date/Time: 03/31/2022 11:17 PM  Performed by: Blake Divine, MD Authorized by: Blake Divine, MD   Consent:    Consent obtained:  Verbal   Consent given by:  Patient Universal protocol:    Patient identity confirmed:  Verbally with patient and arm band Anesthesia:    Anesthesia method:  Local infiltration   Local anesthetic:  Lidocaine 2% WITH epi Laceration details:    Location:  Scalp   Scalp location:  Frontal   Length (cm):  2 Pre-procedure details:    Preparation:  Patient was prepped and draped in usual sterile fashion and imaging obtained to evaluate for foreign bodies Exploration:    Imaging obtained comment:  CT   Imaging outcome: foreign body not noted     Wound exploration: wound explored through full range of motion and entire depth of wound visualized     Wound extent: no areolar tissue violation noted, no fascia violation noted,  no foreign bodies/material noted, no muscle damage noted, no nerve damage noted, no tendon damage noted, no underlying fracture noted and no vascular damage noted     Contaminated: no   Treatment:    Area cleansed with:  Saline   Amount of cleaning:  Standard   Irrigation solution:  Sterile saline   Irrigation method:  Pressure wash   Visualized foreign bodies/material removed: no     Debridement:  None   Undermining:  None   Scar revision: no   Skin repair:    Repair method:  Sutures   Suture size:  4-0   Suture material:  Nylon   Number of sutures:  2 Approximation:    Approximation:  Loose Repair type:    Repair type:  Simple Post-procedure details:    Dressing:  Non-adherent dressing   Procedure completion:  Tolerated  well, no immediate complications    MEDICATIONS ORDERED IN ED: Medications  lidocaine-EPINEPHrine (XYLOCAINE W/EPI) 2 %-1:100000 (with pres) injection 10 mL (10 mLs Infiltration Given 03/31/22 2251)     IMPRESSION / MDM / ASSESSMENT AND PLAN / ED COURSE  I reviewed the triage vital signs and the nursing notes.                              81 y.o. male with past medical history of hypertension, CAD, and anemia who presents to the ED after losing his balance and falling forward, striking his head without LOC.  Patient's presentation is most consistent with acute presentation with potential threat to life or bodily function.  Differential diagnosis includes, but is not limited to, intracranial hemorrhage, scalp laceration, cervical spine injury.  Patient nontoxic-appearing and in no acute distress, vital signs are unremarkable.  He is at his baseline mental status with no focal neurologic deficits on exam, CT head and cervical spine are negative for acute process.  He does have a small laceration to his frontal scalp with ragged edges which was loosely approximated with suture.  He is appropriate for discharge home with follow-up in 1 week for suture removal, patient and family agree with plan.      FINAL CLINICAL IMPRESSION(S) / ED DIAGNOSES   Final diagnoses:  Fall, initial encounter  Laceration of scalp, initial encounter     Rx / DC Orders   ED Discharge Orders     None        Note:  This document was prepared using Dragon voice recognition software and may include unintentional dictation errors.   Blake Divine, MD 03/31/22 [phone]

## 2022-03-31 NOTE — ED Notes (Signed)
Pt back from CT

## 2022-03-31 NOTE — ED Triage Notes (Signed)
Pt is aaox4. Sts he was reaching down to grab something and kept going. Pt has lac noted to the head per EMS that has a dressing in place. Pt is on blood thinners. MD notified and orders placed.

## 2022-03-31 NOTE — ED Notes (Signed)
Patient transported to CT 

## 2022-03-31 NOTE — ED Notes (Signed)
ED Provider at bedside. 

## 2022-04-04 ENCOUNTER — Telehealth (INDEPENDENT_AMBULATORY_CARE_PROVIDER_SITE_OTHER): Payer: Self-pay | Admitting: Vascular Surgery

## 2022-04-04 NOTE — Telephone Encounter (Signed)
LVM for pt TCB and schedule sclero appt  bilateral SALINE sclero. no auth req. see FB X 3

## 2022-04-08 ENCOUNTER — Ambulatory Visit: Payer: Medicare Other | Admitting: Physician Assistant

## 2022-04-10 ENCOUNTER — Emergency Department: Payer: Medicare Other

## 2022-04-10 ENCOUNTER — Other Ambulatory Visit: Payer: Self-pay

## 2022-04-10 ENCOUNTER — Inpatient Hospital Stay
Admission: EM | Admit: 2022-04-10 | Discharge: 2022-04-12 | DRG: 291 | Disposition: A | Discharge status: Home Health | Payer: Medicare Other | Attending: Internal Medicine | Admitting: Internal Medicine

## 2022-04-10 ENCOUNTER — Encounter: Payer: Self-pay | Admitting: Internal Medicine

## 2022-04-10 DIAGNOSIS — I480 Paroxysmal atrial fibrillation: Secondary | ICD-10-CM | POA: Diagnosis present

## 2022-04-10 DIAGNOSIS — I482 Chronic atrial fibrillation, unspecified: Secondary | ICD-10-CM | POA: Diagnosis not present

## 2022-04-10 DIAGNOSIS — E785 Hyperlipidemia, unspecified: Secondary | ICD-10-CM | POA: Diagnosis present

## 2022-04-10 DIAGNOSIS — Z20822 Contact with and (suspected) exposure to covid-19: Secondary | ICD-10-CM | POA: Diagnosis present

## 2022-04-10 DIAGNOSIS — Z66 Do not resuscitate: Secondary | ICD-10-CM | POA: Diagnosis present

## 2022-04-10 DIAGNOSIS — E039 Hypothyroidism, unspecified: Secondary | ICD-10-CM | POA: Diagnosis present

## 2022-04-10 DIAGNOSIS — Z4802 Encounter for removal of sutures: Secondary | ICD-10-CM

## 2022-04-10 DIAGNOSIS — Z87891 Personal history of nicotine dependence: Secondary | ICD-10-CM

## 2022-04-10 DIAGNOSIS — I5023 Acute on chronic systolic (congestive) heart failure: Secondary | ICD-10-CM | POA: Diagnosis not present

## 2022-04-10 DIAGNOSIS — Z79890 Hormone replacement therapy (postmenopausal): Secondary | ICD-10-CM

## 2022-04-10 DIAGNOSIS — I10 Essential (primary) hypertension: Secondary | ICD-10-CM | POA: Diagnosis present

## 2022-04-10 DIAGNOSIS — I110 Hypertensive heart disease with heart failure: Secondary | ICD-10-CM | POA: Diagnosis not present

## 2022-04-10 DIAGNOSIS — Z8505 Personal history of malignant neoplasm of liver: Secondary | ICD-10-CM

## 2022-04-10 DIAGNOSIS — Z7901 Long term (current) use of anticoagulants: Secondary | ICD-10-CM

## 2022-04-10 DIAGNOSIS — I959 Hypotension, unspecified: Secondary | ICD-10-CM | POA: Diagnosis not present

## 2022-04-10 DIAGNOSIS — J9601 Acute respiratory failure with hypoxia: Secondary | ICD-10-CM | POA: Diagnosis not present

## 2022-04-10 DIAGNOSIS — I509 Heart failure, unspecified: Secondary | ICD-10-CM

## 2022-04-10 DIAGNOSIS — Z23 Encounter for immunization: Secondary | ICD-10-CM

## 2022-04-10 DIAGNOSIS — D509 Iron deficiency anemia, unspecified: Secondary | ICD-10-CM

## 2022-04-10 DIAGNOSIS — Z85118 Personal history of other malignant neoplasm of bronchus and lung: Secondary | ICD-10-CM

## 2022-04-10 DIAGNOSIS — Z79899 Other long term (current) drug therapy: Secondary | ICD-10-CM

## 2022-04-10 DIAGNOSIS — Z820 Family history of epilepsy and other diseases of the nervous system: Secondary | ICD-10-CM

## 2022-04-10 LAB — COMPREHENSIVE METABOLIC PANEL
ALT: 18 U/L (ref 0–44)
AST: 28 U/L (ref 15–41)
Albumin: 3.2 g/dL — ABNORMAL LOW (ref 3.5–5.0)
Alkaline Phosphatase: 110 U/L (ref 38–126)
Anion gap: 8 (ref 5–15)
BUN: 19 mg/dL (ref 8–23)
CO2: 28 mmol/L (ref 22–32)
Calcium: 8.2 mg/dL — ABNORMAL LOW (ref 8.9–10.3)
Chloride: 101 mmol/L (ref 98–111)
Creatinine, Ser: 0.83 mg/dL (ref 0.61–1.24)
GFR, Estimated: >60 mL/min (ref >60)
Glucose, Bld: 104 mg/dL — ABNORMAL HIGH (ref 70–99)
Potassium: 4.8 mmol/L (ref 3.5–5.1)
Sodium: 137 mmol/L (ref 135–145)
Total Bilirubin: 0.8 mg/dL (ref 0.3–1.2)
Total Protein: 7.4 g/dL (ref 6.5–8.1)

## 2022-04-10 LAB — CBC WITH DIFFERENTIAL/PLATELET
Abs Immature Granulocytes: 0.05 10*3/uL (ref 0.00–0.07)
Basophils Absolute: 0 10*3/uL (ref 0.0–0.1)
Basophils Relative: 0 %
Eosinophils Absolute: 0 10*3/uL (ref 0.0–0.5)
Eosinophils Relative: 1 %
HCT: 33.4 % — ABNORMAL LOW (ref 39.0–52.0)
Hemoglobin: 9.4 g/dL — ABNORMAL LOW (ref 13.0–17.0)
Immature Granulocytes: 1 %
Lymphocytes Relative: 4 %
Lymphs Abs: 0.3 10*3/uL — ABNORMAL LOW (ref 0.7–4.0)
MCH: 26.6 pg (ref 26.0–34.0)
MCHC: 28.1 g/dL — ABNORMAL LOW (ref 30.0–36.0)
MCV: 94.6 fL (ref 80.0–100.0)
Monocytes Absolute: 0.8 10*3/uL (ref 0.1–1.0)
Monocytes Relative: 9 %
Neutro Abs: 7.6 10*3/uL (ref 1.7–7.7)
Neutrophils Relative %: 85 %
Platelets: 185 10*3/uL (ref 150–400)
RBC: 3.53 MIL/uL — ABNORMAL LOW (ref 4.22–5.81)
RDW: 23.6 % — ABNORMAL HIGH (ref 11.5–15.5)
WBC: 8.8 10*3/uL (ref 4.0–10.5)
nRBC: 0 % (ref 0.0–0.2)

## 2022-04-10 LAB — PROCALCITONIN: Procalcitonin: 3.19 ng/mL

## 2022-04-10 LAB — BRAIN NATRIURETIC PEPTIDE: B Natriuretic Peptide: 1017.2 pg/mL — ABNORMAL HIGH (ref 0.0–100.0)

## 2022-04-10 LAB — TROPONIN I (HIGH SENSITIVITY)
Troponin I (High Sensitivity): 12 ng/L (ref <18)
Troponin I (High Sensitivity): 11 ng/L (ref <18)

## 2022-04-10 LAB — SARS CORONAVIRUS 2 BY RT PCR: SARS Coronavirus 2 by RT PCR: NEGATIVE

## 2022-04-10 MED ORDER — NITROGLYCERIN 0.4 MG SL SUBL: 0.4000 mg | SUBLINGUAL_TABLET | SUBLINGUAL | Status: DC | PRN

## 2022-04-10 MED ORDER — SODIUM CHLORIDE 0.9 % IV SOLN: 250.0000 mL | INTRAVENOUS | Status: DC | PRN

## 2022-04-10 MED ORDER — TIOTROPIUM BROMIDE MONOHYDRATE 18 MCG IN CAPS
18.0000 ug | ORAL_CAPSULE | Freq: Every day | RESPIRATORY_TRACT | Status: DC
  Administered 2022-04-11 – 2022-04-12 (×2): 18 ug via RESPIRATORY_TRACT
  Filled 2022-04-10: qty 5

## 2022-04-10 MED ORDER — ORAL CARE MOUTH RINSE
15.0000 mL | OROMUCOSAL | Status: DC
  Administered 2022-04-11 – 2022-04-12 (×2): 15 mL via OROMUCOSAL

## 2022-04-10 MED ORDER — SERTRALINE HCL 50 MG PO TABS
100.0000 mg | ORAL_TABLET | Freq: Every day | ORAL | Status: DC
  Administered 2022-04-11 – 2022-04-12 (×2): 100 mg via ORAL
  Filled 2022-04-10 (×2): qty 2

## 2022-04-10 MED ORDER — ACETAMINOPHEN ER 650 MG PO TBCR: 650.0000 mg | EXTENDED_RELEASE_TABLET | Freq: Three times a day (TID) | ORAL | Status: DC | PRN

## 2022-04-10 MED ORDER — ACETAMINOPHEN 325 MG PO TABS
650.0000 mg | ORAL_TABLET | ORAL | Status: DC | PRN
  Filled 2022-04-10: qty 2

## 2022-04-10 MED ORDER — FUROSEMIDE 10 MG/ML IJ SOLN
40.0000 mg | Freq: Four times a day (QID) | INTRAMUSCULAR | Status: AC
  Administered 2022-04-10 – 2022-04-11 (×2): 40 mg via INTRAVENOUS
  Filled 2022-04-10 (×2): qty 4

## 2022-04-10 MED ORDER — INFLUENZA VAC A&B SA ADJ QUAD 0.5 ML IM PRSY
0.5000 mL | PREFILLED_SYRINGE | INTRAMUSCULAR | Status: AC
  Administered 2022-04-12: 0.5 mL via INTRAMUSCULAR
  Filled 2022-04-10: qty 0.5

## 2022-04-10 MED ORDER — LOSARTAN POTASSIUM 25 MG PO TABS
12.5000 mg | ORAL_TABLET | Freq: Every day | ORAL | Status: DC
  Filled 2022-04-10: qty 1

## 2022-04-10 MED ORDER — SODIUM CHLORIDE 0.9% FLUSH
3.0000 mL | Freq: Two times a day (BID) | INTRAVENOUS | Status: DC
  Administered 2022-04-10 – 2022-04-12 (×4): 3 mL via INTRAVENOUS

## 2022-04-10 MED ORDER — ORAL CARE MOUTH RINSE: 15.0000 mL | OROMUCOSAL | Status: DC | PRN

## 2022-04-10 MED ORDER — SODIUM CHLORIDE 0.9% FLUSH
3.0000 mL | INTRAVENOUS | Status: DC | PRN
  Administered 2022-04-10: 3 mL via INTRAVENOUS

## 2022-04-10 MED ORDER — ORAL CARE MOUTH RINSE
15.0000 mL | OROMUCOSAL | Status: DC
  Administered 2022-04-10: 15 mL via OROMUCOSAL

## 2022-04-10 MED ORDER — FUROSEMIDE 10 MG/ML IJ SOLN
80.0000 mg | Freq: Once | INTRAMUSCULAR | Status: AC
  Administered 2022-04-10: 80 mg via INTRAVENOUS
  Filled 2022-04-10: qty 8

## 2022-04-10 MED ORDER — ROSUVASTATIN CALCIUM 10 MG PO TABS
10.0000 mg | ORAL_TABLET | Freq: Every day | ORAL | Status: DC
  Administered 2022-04-11 – 2022-04-12 (×2): 10 mg via ORAL
  Filled 2022-04-10 (×2): qty 1

## 2022-04-10 MED ORDER — LEVOTHYROXINE SODIUM 50 MCG PO TABS
175.0000 ug | ORAL_TABLET | Freq: Every day | ORAL | Status: DC
  Administered 2022-04-11 – 2022-04-12 (×2): 175 ug via ORAL
  Filled 2022-04-10 (×2): qty 4

## 2022-04-10 MED ORDER — GABAPENTIN 300 MG PO CAPS
300.0000 mg | ORAL_CAPSULE | Freq: Three times a day (TID) | ORAL | Status: DC
  Administered 2022-04-10 – 2022-04-12 (×5): 300 mg via ORAL
  Filled 2022-04-10 (×5): qty 1

## 2022-04-10 MED ORDER — ONDANSETRON HCL 4 MG/2ML IJ SOLN: 4.0000 mg | Freq: Four times a day (QID) | INTRAMUSCULAR | Status: DC | PRN

## 2022-04-10 MED ORDER — RIVAROXABAN 20 MG PO TABS
20.0000 mg | ORAL_TABLET | Freq: Every day | ORAL | Status: DC
  Administered 2022-04-10 – 2022-04-11 (×2): 20 mg via ORAL
  Filled 2022-04-10 (×2): qty 1

## 2022-04-10 MED ORDER — CARVEDILOL 6.25 MG PO TABS
3.1250 mg | ORAL_TABLET | Freq: Two times a day (BID) | ORAL | Status: DC
  Administered 2022-04-11: 3.125 mg via ORAL
  Filled 2022-04-10: qty 1

## 2022-04-10 MED ORDER — SPIRONOLACTONE 25 MG PO TABS
50.0000 mg | ORAL_TABLET | Freq: Two times a day (BID) | ORAL | Status: DC
  Administered 2022-04-10 – 2022-04-11 (×2): 50 mg via ORAL
  Filled 2022-04-10 (×2): qty 2

## 2022-04-10 NOTE — Assessment & Plan Note (Addendum)
Patient with improvement in volume status.  Urine output 7,600 ml over last 24 hrs Systolic blood pressure 81 to 97 mmHg.   Plan to hold on furosemide and losartan Follow up on new echocardiogram.  Out of bed as tolerated.  PT and OT.

## 2022-04-10 NOTE — ED Triage Notes (Signed)
Via EMS from [place] c/o SOB and generalized edema. Pt has wounds on top of his feet due to swelling and reports 5/10 bilateral foot pain. Pt has had numerous recent falls and has wounds on all extremities and recent stitches on his forehead with no falls since that injury and eval. A/O x 4. Pt takes blood thinners. Pt does not wear supplemental O2 at baseline and only wears CPAP at night. Currently on 2L O2 placed by EMS.

## 2022-04-10 NOTE — Assessment & Plan Note (Signed)
Patient with significant diuresis, his blood pressure is low this am, consistent with hypotension.  Plan to hold on antihypertensive medications and diuretics for today. Continue close blood pressure monitoring,

## 2022-04-10 NOTE — ED Provider Notes (Addendum)
[facility] Provider Note    Event Date/Time   First MD Initiated Contact with Patient 04/10/22 1508     (approximate)   History   Shortness of Breath   HPI  Louis Sanchez is a 81 y.o. male who lives at [place] with increasing shortness of breath.  Patient is on CPAP at nighttime and room air during the day he has a history of CHF and lung cancer.  Patient stopped at the fire house for increased shortness of breath on his way to the hospital and had a pulse ox of 89% so patient was placed on 2 L.  He has some increased bilateral lower extremity swelling.  Patient denies any new falls except when he was seen on 9/7.  He is on a blood thinner.  He reports some swelling in his bilateral legs and increasing shortness of breath.  No known fevers.   I reviewed patient's records from July 2023 where he was admitted and has a history of atrial fibrillation which he is on Xarelto for.  Physical Exam   Triage Vital Signs: ED Triage Vitals  Enc Vitals Group     BP 04/10/22 1501 125/76     Pulse Rate 04/10/22 1501 78     Resp 04/10/22 1501 (!) 22     Temp 04/10/22 1501 97.8 F (36.6 C)     Temp Source 04/10/22 1501 Oral     SpO2 04/10/22 1501 97 %     Weight 04/10/22 1502 125 lb (56.7 kg)     Height 04/10/22 1502 5\' 6"  (1.676 m)     Head Circumference --      Peak Flow --      Pain Score 04/10/22 1501 5     Pain Loc --      Pain Edu? --      Excl. in [place]? --     Most recent vital signs: Vitals:   04/10/22 1501  BP: 125/76  Pulse: 78  Resp: (!) 22  Temp: 97.8 F (36.6 C)  SpO2: 97%     General: Awake, no distress.  CV:  Good peripheral perfusion.  Resp:  Normal effort.  No wheezing.  Some intermittent increased work of breathing Abd:  No distention.  Other:  Swelling noted to the legs bilaterally.  Patient has old sutures noted in the forehead Patient has some mild abrasions on his knees with a little bit of erythema around them.  He is  got good distal pulses in his feet.  ED Results / Procedures / Treatments   Labs (all labs ordered are listed, but only abnormal results are displayed) Labs Reviewed  CBC WITH DIFFERENTIAL/PLATELET - Abnormal; Notable for the following components:      Result Value   RBC 3.53 (*)    Hemoglobin 9.4 (*)    HCT 33.4 (*)    MCHC 28.1 (*)    RDW 23.6 (*)    Lymphs Abs 0.3 (*)    All other components within normal limits  COMPREHENSIVE METABOLIC PANEL - Abnormal; Notable for the following components:   Glucose, Bld 104 (*)    Calcium 8.2 (*)    Albumin 3.2 (*)    All other components within normal limits  BRAIN NATRIURETIC PEPTIDE - Abnormal; Notable for the following components:   B Natriuretic Peptide 1,017.2 (*)    All other components within normal limits  SARS CORONAVIRUS 2 BY RT PCR  TROPONIN I (HIGH SENSITIVITY)  TROPONIN  I (HIGH SENSITIVITY)     EKG  My interpretation of EKG:  Atrial fibrillation rate of 84 without any ST elevation or T wave inversions, incomplete right bundle branch block  RADIOLOGY I have reviewed the xray personally and interpreted and patient has some cardiomegaly with some infiltrates on the right side   PROCEDURES:  Critical Care performed: Yes, see critical care procedure note(s)  .1-3 Lead EKG Interpretation  Performed by: Vanessa [place], MD Authorized by: Vanessa [place], MD     Interpretation: abnormal     ECG rate:  70   ECG rate assessment: normal     Rhythm: atrial fibrillation     Ectopy: none     Conduction: normal   .Critical Care  Performed by: Vanessa [place], MD Authorized by: Vanessa [place], MD   Critical care provider statement:    Critical care time (minutes):  30   Critical care was necessary to treat or prevent imminent or life-threatening deterioration of the following conditions:  Respiratory failure   Critical care was time spent personally by me on the following activities:  Development of treatment plan with  patient or surrogate, discussions with consultants, evaluation of patient's response to treatment, examination of patient, ordering and review of laboratory studies, ordering and review of radiographic studies, ordering and performing treatments and interventions, pulse oximetry, re-evaluation of patient's condition and review of old charts .Suture Removal  Date/Time: 04/10/2022 4:53 PM  Performed by: Vanessa [place], MD Authorized by: Vanessa [place], MD   Consent:    Consent obtained:  Verbal   Consent given by:  Patient Universal protocol:    Patient identity confirmed:  Verbally with patient Location:    Location:  Head/neck   Head/neck location:  Forehead Procedure details:    Wound appearance:  No signs of infection   Number of sutures removed:  2 Post-procedure details:    Procedure completion:  Tolerated well, no immediate complications    MEDICATIONS ORDERED IN ED: Medications - No data to display   IMPRESSION / MDM / [place] / ED COURSE  I reviewed the triage vital signs and the nursing notes.   Patient's presentation is most consistent with acute presentation with potential threat to life or bodily function.   Patient comes in with acute shortness of breath with exam concerning for CHF.  Will get labs to evaluate, chest x-ray to evaluate for any pneumonia, effusion, pulmonary edema.  COVID.  Patient placed on 2 L and appears more comfortable.  COVID test is negative.  Hemoglobin is low but stable increasing from a month ago.  CMP is reassuring.  Troponin is negative.  BNP is increased  Patient's chest x-ray shows bilateral pleural effusions and some lung base opacities that could be related to infection or inflammatory versus lymphatic spread of carcinoma.  Patient does not appear infected at this time we will hold off on antibiotics and add procalcitonin on.  Discussed the findings on the x-ray with family.  We will get all of a dose of IV Lasix.  The patient  is on the cardiac monitor to evaluate for evidence of arrhythmia and/or significant heart rate changes.  Clinical Course as of 04/10/22 1704  Sun Apr 10, 2022  1702 DG Chest Portable 1 View [MF]    Clinical Course User Index [MF] Vanessa , MD     FINAL CLINICAL IMPRESSION(S) / ED DIAGNOSES   Final diagnoses:  Acute respiratory failure with hypoxia ([place])  Visit for suture removal  Acute on chronic congestive heart failure, unspecified heart failure type ([place])     Rx / DC Orders   ED Discharge Orders     None        Note:  This document was prepared using Dragon voice recognition software and may include unintentional dictation errors.   Vanessa Flowella, MD 04/10/22 1655    Vanessa [place], MD 04/10/22 1714

## 2022-04-10 NOTE — ED Triage Notes (Signed)
Per EMT report, patient lives at [facility] and c/o increased shortness of breath. Patient is on CPAP at night and room air during the day. Patient has a history of CHF and lung cancer. Patient stopped at firehouse for increased shortness of breath on his way to the hospital and had a pulse ox of 89%. Patient was placed on 2L O2 via [place] by EMT and is now at 95%. Patient also c/o increased Bilateral LLE, increased shortness of breath with exertion and has an abdominal hernia.  EMS VS: 129/87 100 pulse 22 RR 95% 2L 37 end tidal

## 2022-04-10 NOTE — H&P (Signed)
History and Physical    [name] EXB:[id number] DOB: 02-06-1941 DOA: 04/10/2022  DOS: the patient was seen and examined on 04/10/2022  PCP: Adin Hector, MD   Patient coming from: ALF  I have personally briefly reviewed patient's old medical records in [place]  [name], a 81 y/o with a complex medical h/o including Lung Cancer s/p RLL lobectomy, Hepatic carcinoma currently treated monthly at [facility], HFrEF, HTN, PAF. He presents to [facility]ED for evaluation of markedly increased SOB/increased WOB. Denies Chest pain, Fever, productive cough.    ED Course: afebrile, VSS. Patient visibly SOB per EDP. Lab: glucose 104, Albumin 3.2 BNP 1017.4 Troponin 12, CBCD nl. CXR with old right pleural effusion, new left effusion. TRH called to admit for mgt of acute on chronic HFrEF  Review of Systems:  Review of Systems  Constitutional:  Negative for chills, fever and weight loss.  HENT: Negative.    Eyes: Negative.   Respiratory:  Positive for shortness of breath. Negative for cough and sputum production.   Cardiovascular:  Positive for palpitations. Negative for chest pain.  Gastrointestinal: Negative.   Genitourinary: Negative.   Musculoskeletal: Negative.   Skin: Negative.   Neurological: Negative.   Endo/Heme/Allergies: Negative.   Psychiatric/Behavioral: Negative.      Past Medical History:  Diagnosis Date   Arthritis    Benign neoplasm of colon 2004   Benign neoplasm of colon 04/04/2008   61mm tubular adenoma of the ascending colon   Cancer ([place])    liver and lungs   Heart disease    Other and unspecified hyperlipidemia 1997   Other specified disorder of male genital organs(608.89) 214   Sleep disturbance, unspecified    Thyroid disease    Unspecified hypothyroidism 2005    Past Surgical History:  Procedure Laterality Date   APPENDECTOMY  1953   colon polyps  2009   HERNIA REPAIR  1957   Right   INGUINAL HERNIA REPAIR Right 2014   SPINE SURGERY  1997    neck   WRIST SURGERY      Soc Hx - Marriage #1 -10 years ending in divorce. Marriage #2 68 years. Four children, 5 grands, 6 great-grands. 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 Lives in congregate living. Wife has parkinson's.   reports that he has quit smoking. His smoking use included cigarettes. He has never used smokeless tobacco. He reports current alcohol use of about 15.0 standard drinks of alcohol per week. He reports that he does not use drugs.  Allergies  Allergen Reactions   Atorvastatin     Other reaction(s): Muscle Pain Other reaction(s): Muscle Pain Other reaction(s): Muscle Pain Other reaction(s): Muscle Pain Other reaction(s): Muscle Pain    Levofloxacin Rash    Family History  Problem Relation Age of Onset   Alzheimer's disease Mother     Prior to Admission medications   Medication Sig Start Date End Date Taking? Authorizing Provider  acetaminophen (TYLENOL) 650 MG CR tablet Take 650 mg by mouth every 8 (eight) hours as needed for pain.    [provider]  Ferrous Sulfate (IRON PO) Take 325 mg by mouth daily.    [provider]  furosemide (LASIX) 40 MG tablet Take 20 mg by mouth daily. 11/23/20   [provider]  gabapentin (NEURONTIN) 300 MG capsule Take 300 mg by mouth 3 (three) times daily.    [provider]  levothyroxine (SYNTHROID) 175 MCG tablet  Take 175 mcg by mouth daily before breakfast. 12/30/20 02/18/22  [provider]  losartan (COZAAR) 25 MG tablet Take 12.5 mg by mouth daily. 11/16/20   [provider]  Multiple Vitamin (MULTIVITAMIN) tablet Take 1 tablet by mouth daily.    [provider]  nitroGLYCERIN (NITROSTAT) 0.4 MG SL tablet Place under the tongue. 09/01/20 09/24/21  [provider]  nitroGLYCERIN (NITROSTAT) 0.4 MG SL tablet Place under the tongue. 11/25/21 11/25/22  [provider]  rivaroxaban (XARELTO) 20 MG TABS tablet Take 20 mg  by mouth daily with supper.    [provider]  rosuvastatin (CRESTOR) 10 MG tablet Take 10 mg by mouth daily.    [provider]  sertraline (ZOLOFT) 100 MG tablet Take 1 tablet by mouth daily. 01/21/13   [provider]  silver sulfADIAZINE (SILVADENE) 1 % cream Per instructions DAILY (route: topical) 09/24/21   [provider]  spironolactone (ALDACTONE) 50 MG tablet Take 1 tablet (50 mg total) by mouth 2 (two) times daily. 09/13/16   Theodoro Grist, MD  tiotropium (SPIRIVA) 18 MCG inhalation capsule Place 18 mcg into inhaler and inhale daily.    [provider]    Physical Exam: Vitals:   04/10/22 1501 04/10/22 1502 04/10/22 1754  BP: 125/76  124/80  Pulse: 78  81  Resp: (!) 22  19  Temp: 97.8 F (36.6 C)  97.7 F (36.5 C)  TempSrc: Oral  Oral  SpO2: 97%  100%  Weight:  56.7 kg   Height:  5\' 6"  (1.676 m)     Physical Exam Vitals and nursing note reviewed.  Constitutional:      General: He is not in acute distress.    Comments: Cachectic elderly man in no distress.   HENT:     Head: Normocephalic and atraumatic.     Mouth/Throat:     Mouth: Mucous membranes are moist.     Comments: Native dentition Eyes:     Extraocular Movements: Extraocular movements intact.     Pupils: Pupils are equal, round, and reactive to light.  Neck:     Vascular: No JVD.     Trachea: No tracheal deviation.  Cardiovascular:     Rate and Rhythm: Normal rate. Rhythm irregular.     Pulses: Normal pulses.     Heart sounds: No murmur heard. Pulmonary:     Effort: Tachypnea present.     Comments: Decreased breath sounds at bilateral bases L>R, no rales, no wheezes Chest:     Chest wall: No mass or tenderness.  Abdominal:     General: Bowel sounds are normal.     Palpations: Abdomen is soft. There is no hepatomegaly.  Musculoskeletal:        General: Normal range of motion.     Cervical back: Normal range of motion and neck supple.  Skin:    General:  Skin is warm and dry.  Neurological:     General: No focal deficit present.     Mental Status: He is alert and oriented to person, place, and time.     Cranial Nerves: No cranial nerve deficit.  Psychiatric:        Mood and Affect: Mood normal.        Behavior: Behavior normal.      Labs on Admission: I have personally reviewed following labs and imaging studies  CBC: Recent Labs  Lab 04/10/22 1539  WBC 8.8  NEUTROABS 7.6  HGB 9.4*  HCT 33.4*  MCV 94.6  PLT 174   Basic Metabolic Panel: Recent Labs  Lab 04/10/22 1539  NA 137  K 4.8  CL 101  CO2 28  GLUCOSE 104*  BUN 19  CREATININE 0.83  CALCIUM 8.2*   GFR: Estimated Creatinine Clearance: 57.9 mL/min (by C-G formula based on SCr of 0.83 mg/dL). Liver Function Tests: Recent Labs  Lab 04/10/22 1539  AST 28  ALT 18  ALKPHOS 110  BILITOT 0.8  PROT 7.4  ALBUMIN 3.2*   No results for input(s): "LIPASE", "AMYLASE" in the last 168 hours. No results for input(s): "AMMONIA" in the last 168 hours. Coagulation Profile: No results for input(s): "INR", "PROTIME" in the last 168 hours. Cardiac Enzymes: No results for input(s): "CKTOTAL", "CKMB", "CKMBINDEX", "TROPONINI" in the last 168 hours. BNP (last 3 results) No results for input(s): "PROBNP" in the last 8760 hours. HbA1C: No results for input(s): "HGBA1C" in the last 72 hours. CBG: No results for input(s): "GLUCAP" in the last 168 hours. Lipid Profile: No results for input(s): "CHOL", "HDL", "LDLCALC", "TRIG", "CHOLHDL", "LDLDIRECT" in the last 72 hours. Thyroid Function Tests: No results for input(s): "TSH", "T4TOTAL", "FREET4", "T3FREE", "THYROIDAB" in the last 72 hours. Anemia Panel: No results for input(s): "VITAMINB12", "FOLATE", "FERRITIN", "TIBC", "IRON", "RETICCTPCT" in the last 72 hours. Urine analysis:    Component Value Date/Time   COLORURINE AMBER (A) 09/11/2016 1441   APPEARANCEUR CLEAR (A) 09/11/2016 1441   LABSPEC 1.021 09/11/2016 1441    PHURINE 5.0 09/11/2016 1441   GLUCOSEU NEGATIVE 09/11/2016 1441   HGBUR NEGATIVE 09/11/2016 1441   BILIRUBINUR NEGATIVE 09/11/2016 1441   KETONESUR NEGATIVE 09/11/2016 1441   PROTEINUR NEGATIVE 09/11/2016 1441   NITRITE NEGATIVE 09/11/2016 1441   LEUKOCYTESUR NEGATIVE 09/11/2016 1441    Radiological Exams on Admission: I have personally reviewed images DG Chest Portable 1 View  Result Date: 04/10/2022 CLINICAL DATA:  Short of breath. Patient on CPAP at night. History of CHF and lung carcinoma. EXAM: PORTABLE CHEST 1 VIEW COMPARISON:  02/18/2022.  CT, 10/02/2021. FINDINGS: Enlarged cardiac silhouette, stable. No mediastinal or hilar masses. There is opacity at both lung bases obscuring hemidiaphragms consistent with bilateral effusions. Additional opacity is noted in the right mid to lower lung, mildly increased when compared to the prior radiographs. Mild opacities noted at the left lung base consistent with atelectasis. No pneumothorax. Skeletal structures are grossly intact. IMPRESSION: 1. Bilateral pleural effusions, small to moderate size, right similar to the prior chest radiograph, left new. 2. Lung base opacities consistent with atelectasis. Additional hazy opacity and interstitial prominence in the right lung increased when compared to the prior chest CT. Findings may be infectious or inflammatory. Lymphangitic spread of carcinoma is not excluded. 3. Stable cardiomegaly.  No convincing pulmonary edema. Electronically Signed   By: Lajean Manes M.D.   On: 04/10/2022 15:40    EKG: I have personally reviewed EKG: A fib controlled rate. No acute changes  Assessment/Plan Active Problems:   Acute on chronic systolic CHF (congestive heart failure) (HCC)   Essential hypertension   Chronic atrial fibrillation (HCC)    Assessment and Plan: Acute on chronic systolic CHF (congestive heart failure) ([place]) Patient presents with SOB. BNP 1.017.4, CXR with new left pleural effusion, old right  pleural effusion. Last ECHO found - [facility] 06/24/20 EF 45%. On PO lasix at home and [facility].  Plan IV lasix 40 mg q 6x 3 doses  Continue home meds  Add Coreg  ECHO  Essential hypertension BP stable.  Plan  Continue present meds  Add coreg 3.125 bid  Monitor BP on new medication  Chronic atrial fibrillation (HCC) EKG reveals a fib at controlled rate. Patient on Xarelto  Plan Continue present meds       DVT prophylaxis: Xarelto Code Status: DNR/DNI(Do NOT Intubate) Family Communication: left message for margaret Cobbins,wife, [phone]  Disposition Plan: Home 24-48 hrs  Consults called: none  Admission status: Observation, Telemetry bed   Adella Hare, MD [facility] 04/10/2022, 6:59 PM

## 2022-04-10 NOTE — Subjective & Objective (Signed)
Mr. Sleight, a 81 y/o with a complex medical h/o including Lung Cancer s/p RLL lobectomy, Hepatic carcinoma currently treated monthly at [facility], HFrEF, HTN, PAF. He presents to [facility] for evaluation of markedly increased SOB/increased WOB. Denies Chest pain, Fever, productive cough.

## 2022-04-10 NOTE — Assessment & Plan Note (Signed)
Rate has been controlled.  Metoprolol on HOLD to prevent hypotension.  Continue anticoagulation with Xarelto.

## 2022-04-10 NOTE — Assessment & Plan Note (Deleted)
EKG reveals a fib at controlled rate. Patient on Xarelto  Plan Continue present meds

## 2022-04-11 ENCOUNTER — Observation Stay
Admit: 2022-04-11 | Discharge: 2022-04-11 | Disposition: A | Discharge status: Home / Self Care | Payer: Medicare Other | Attending: Internal Medicine | Admitting: Internal Medicine

## 2022-04-11 DIAGNOSIS — I480 Paroxysmal atrial fibrillation: Secondary | ICD-10-CM | POA: Diagnosis present

## 2022-04-11 DIAGNOSIS — Z23 Encounter for immunization: Secondary | ICD-10-CM | POA: Diagnosis present

## 2022-04-11 DIAGNOSIS — I482 Chronic atrial fibrillation, unspecified: Secondary | ICD-10-CM | POA: Diagnosis present

## 2022-04-11 DIAGNOSIS — Z820 Family history of epilepsy and other diseases of the nervous system: Secondary | ICD-10-CM | POA: Diagnosis not present

## 2022-04-11 DIAGNOSIS — D509 Iron deficiency anemia, unspecified: Secondary | ICD-10-CM

## 2022-04-11 DIAGNOSIS — J9601 Acute respiratory failure with hypoxia: Secondary | ICD-10-CM | POA: Diagnosis present

## 2022-04-11 DIAGNOSIS — Z20822 Contact with and (suspected) exposure to covid-19: Secondary | ICD-10-CM | POA: Diagnosis present

## 2022-04-11 DIAGNOSIS — E039 Hypothyroidism, unspecified: Secondary | ICD-10-CM | POA: Diagnosis present

## 2022-04-11 DIAGNOSIS — Z7901 Long term (current) use of anticoagulants: Secondary | ICD-10-CM | POA: Diagnosis not present

## 2022-04-11 DIAGNOSIS — I959 Hypotension, unspecified: Secondary | ICD-10-CM | POA: Diagnosis not present

## 2022-04-11 DIAGNOSIS — Z8505 Personal history of malignant neoplasm of liver: Secondary | ICD-10-CM | POA: Diagnosis not present

## 2022-04-11 DIAGNOSIS — E785 Hyperlipidemia, unspecified: Secondary | ICD-10-CM

## 2022-04-11 DIAGNOSIS — Z66 Do not resuscitate: Secondary | ICD-10-CM | POA: Diagnosis present

## 2022-04-11 DIAGNOSIS — I110 Hypertensive heart disease with heart failure: Secondary | ICD-10-CM | POA: Diagnosis present

## 2022-04-11 DIAGNOSIS — Z79890 Hormone replacement therapy (postmenopausal): Secondary | ICD-10-CM | POA: Diagnosis not present

## 2022-04-11 DIAGNOSIS — I5023 Acute on chronic systolic (congestive) heart failure: Secondary | ICD-10-CM | POA: Diagnosis present

## 2022-04-11 DIAGNOSIS — I10 Essential (primary) hypertension: Secondary | ICD-10-CM | POA: Diagnosis not present

## 2022-04-11 DIAGNOSIS — Z85118 Personal history of other malignant neoplasm of bronchus and lung: Secondary | ICD-10-CM | POA: Diagnosis not present

## 2022-04-11 DIAGNOSIS — Z79899 Other long term (current) drug therapy: Secondary | ICD-10-CM | POA: Diagnosis not present

## 2022-04-11 DIAGNOSIS — Z87891 Personal history of nicotine dependence: Secondary | ICD-10-CM | POA: Diagnosis not present

## 2022-04-11 DIAGNOSIS — I509 Heart failure, unspecified: Secondary | ICD-10-CM | POA: Insufficient documentation

## 2022-04-11 LAB — ECHOCARDIOGRAM COMPLETE
AR max vel: 2.04 cm2
AV Area VTI: 1.97 cm2
AV Area mean vel: 1.94 cm2
AV Mean grad: 6 mmHg
AV Peak grad: 11.7 mmHg
Ao pk vel: 1.71 m/s
Area-P 1/2: 4.1 cm2
Height: 66 in
P 1/2 time: 659 msec
S' Lateral: 2.6 cm
Weight: 2557.34 oz

## 2022-04-11 LAB — BASIC METABOLIC PANEL
Anion gap: 7 (ref 5–15)
BUN: 19 mg/dL (ref 8–23)
CO2: 33 mmol/L — ABNORMAL HIGH (ref 22–32)
Calcium: 7.9 mg/dL — ABNORMAL LOW (ref 8.9–10.3)
Chloride: 99 mmol/L (ref 98–111)
Creatinine, Ser: 0.76 mg/dL (ref 0.61–1.24)
GFR, Estimated: >60 mL/min (ref >60)
Glucose, Bld: 105 mg/dL — ABNORMAL HIGH (ref 70–99)
Potassium: 3.8 mmol/L (ref 3.5–5.1)
Sodium: 139 mmol/L (ref 135–145)

## 2022-04-11 LAB — BRAIN NATRIURETIC PEPTIDE: B Natriuretic Peptide: 1033.9 pg/mL — ABNORMAL HIGH (ref 0.0–100.0)

## 2022-04-11 LAB — PROCALCITONIN: Procalcitonin: 3.72 ng/mL

## 2022-04-11 MED ORDER — POTASSIUM CHLORIDE CRYS ER 20 MEQ PO TBCR
40.0000 meq | EXTENDED_RELEASE_TABLET | Freq: Once | ORAL | Status: AC
  Administered 2022-04-11: 40 meq via ORAL
  Filled 2022-04-11: qty 2

## 2022-04-11 NOTE — Evaluation (Signed)
Occupational Therapy Evaluation Patient Details Name: Louis Sanchez MRN: [id number] DOB: [birth year] Today's Date: 04/11/2022   History of Present Illness Pt is a 81 yo male that was admitted for "working diagnosis of decompensated heart failure". PMH of lung cancer sp right lower lobe lobectomy, hepatic carcinoma, heart failure, hypertension and paroxysmal atrial fibrillation.   Clinical Impression   Louis Sanchez presents to OT with limited endurance and activity tolerance that impacts his ability to safely and independently complete ADLs.  Prior to admission, patient lived in assisted living facility with his wife.  He was able to complete basic ADLs independently without use of AD.  Currently, patient requires supervision/setup assist in BADLs to ensure safety and adherence to energy conservation strategies.  Patient presents with limited endurance as well as decreased safety awareness.  His SpO2 decreased from low 90s to 85 with activity on room air, but improved with rest/use of [place] O2.  OT provided education re: energy conservation strategies and importance of monitoring SpO2.  Louis Sanchez will likely continue to benefit from skilled OT services in acute setting to support functional strengthening, endurance, and safety and independence in ADLs.  Recommend HHOT upon discharge.      Recommendations for follow up therapy are one component of a multi-disciplinary discharge planning process, led by the attending physician.  Recommendations may be updated based on patient status, additional functional criteria and insurance authorization.   Follow Up Recommendations  Home health OT    Assistance Recommended at Discharge Intermittent Supervision/Assistance  Patient can return home with the following A little help with bathing/dressing/bathroom;Assistance with cooking/housework;Direct supervision/assist for medications management;Assist for transportation    Functional Status Assessment  Patient has  had a recent decline in their functional status and demonstrates the ability to make significant improvements in function in a reasonable and predictable amount of time.  Equipment Recommendations  None recommended by OT    Recommendations for Other Services       Precautions / Restrictions Precautions Precautions: Fall Restrictions Weight Bearing Restrictions: No      Mobility Bed Mobility Overal bed mobility: Needs Assistance Bed Mobility: Supine to Sit, Sit to Supine     Supine to sit: Supervision Sit to supine: Supervision     Patient Response: Cooperative  Transfers Overall transfer level: Needs assistance Equipment used: None Transfers: Sit to/from Stand Sit to Stand: Min guard                  Balance Overall balance assessment: Needs assistance Sitting-balance support: Feet supported Sitting balance-Leahy Scale: Good     Standing balance support: No upper extremity supported Standing balance-Leahy Scale: Fair                             ADL either performed or assessed with clinical judgement   ADL Overall ADL's : Needs assistance/impaired Eating/Feeding: Set up;Sitting   Grooming: Supervision/safety;Standing   Upper Body Bathing: Supervision/ safety;Sitting   Lower Body Bathing: Supervison/ safety;Sit to/from stand   Upper Body Dressing : Set up;Sitting   Lower Body Dressing: Supervision/safety;Sit to/from stand   Toilet Transfer: Supervision/safety;Ambulation     Toileting - Clothing Manipulation Details (indicate cue type and reason): not tested   Tub/Shower Transfer Details (indicate cue type and reason): not tested Functional mobility during ADLs: Min guard General ADL Comments: Able to perform basic ADLs with supervision/setup assist     Vision Baseline Vision/History: 4 Cataracts Patient Visual Report: No  change from baseline       Perception     Praxis      Pertinent Vitals/Pain Pain Assessment Pain  Assessment: No/denies pain     Hand Dominance Right   Extremity/Trunk Assessment Upper Extremity Assessment Upper Extremity Assessment: Overall WFL for tasks assessed   Lower Extremity Assessment Lower Extremity Assessment: Generalized weakness       Communication Communication Communication: No difficulties   Cognition Arousal/Alertness: Awake/alert Behavior During Therapy: WFL for tasks assessed/performed Overall Cognitive Status: Within Functional Limits for tasks assessed                                 General Comments: Grossly oriented, decreased safety awareness     General Comments  SpO2 low 90s at rest on room air, decreased to 85% with activity- recovered with rest and encouragement to utilize [place] O2    Exercises Other Exercises Other Exercises: provided education re: OT role and plan of care, fall and safety precautions, self care, fall prevention strategies   Shoulder Instructions      Home Living Family/patient expects to be discharged to:: Assisted living Living Arrangements: Spouse/significant other Available Help at Discharge: Family;Personal care attendant;Available 24 hours/day Type of Home: Assisted living Home Access: Level entry     Home Layout: One level               Home Equipment: Conservation officer, nature (2 wheels);Rollator (4 wheels);Shower seat;Grab bars - tub/shower;Grab bars - toilet (raised toilet seat)          Prior Functioning/Environment Prior Level of Function : Needs assist;Driving;History of Falls (last six months)             Mobility Comments: Patient reports independence with mobility, no AD ADLs Comments: Patient able to complete basic ADLs independently.  He receives assist from ALF for meds, meals, laundry/cleaning.  He drives short distances.        OT Problem List: Decreased strength;Decreased activity tolerance;Impaired balance (sitting and/or standing);Decreased safety awareness;Decreased knowledge  of use of DME or AE;Cardiopulmonary status limiting activity      OT Treatment/Interventions: Self-care/ADL training;Therapeutic exercise;Energy conservation;DME and/or AE instruction;Therapeutic activities;Patient/family education;Balance training    OT Goals(Current goals can be found in the care plan section) Acute Rehab OT Goals Patient Stated Goal: to return home, feel better OT Goal Formulation: With patient Time For Goal Achievement: 04/25/22 Potential to Achieve Goals: Good  OT Frequency: Min 1X/week    Co-evaluation              AM-PAC OT "6 Clicks" Daily Activity     Outcome Measure Help from another person eating meals?: None Help from another person taking care of personal grooming?: None Help from another person toileting, which includes using toliet, bedpan, or urinal?: A Little Help from another person bathing (including washing, rinsing, drying)?: A Little Help from another person to put on and taking off regular upper body clothing?: None Help from another person to put on and taking off regular lower body clothing?: A Little 6 Click Score: 21   End of Session Equipment Utilized During Treatment: Gait belt  Activity Tolerance: Patient tolerated treatment well Patient left: in bed;with call bell/phone within reach;with bed alarm set  OT Visit Diagnosis: Muscle weakness (generalized) (M62.81)                Time: 6314-9702 OT Time Calculation (min): 22 min Charges:  OT General  Charges $OT Visit: 1 Visit OT Evaluation $OT Eval Moderate Complexity: 1 Mod OT Treatments $Therapeutic Activity: 8-22 mins  Jeneen Montgomery, OTR/L 04/11/22, 3:57 PM

## 2022-04-11 NOTE — Discharge Instructions (Signed)

## 2022-04-11 NOTE — Evaluation (Signed)
Physical Therapy Evaluation Patient Details Name: Louis Sanchez MRN: [id number] DOB: [birth year] Today's Date: 04/11/2022  History of Present Illness  Pt is a 81 yo male that was admitted for "working diagnosis of decompensated heart failure". PMH of lung cancer sp right lower lobe lobectomy, hepatic carcinoma, heart failure, hypertension and paroxysmal atrial fibrillation.   Clinical Impression  Patient alert, agreeable to PT, denied pain but did report some feet discomfort once up in walking due to chronic wounds. Pt reported he lives at an ALF with his wife, independent for his ADLs, and facility assists with his wife and all IADLs.  He performed bed mobility with supervision. Attempted to trial pt on room air, but ultimately needed 2L with mobility to be 88% or greater. Sit <> stand with supervision as well but did have 1 small posterior LOB that he was able to correct. He ambulated ~274ft with standing rest breaks led by PT for education, energy conservation and spO2 assessment.  Overall the patient demonstrated deficits (see "PT Problem List") that impede the patient's functional abilities, safety, and mobility and would benefit from skilled PT intervention. Recommendation at this time is HHPT with intermittent supervision/assistance.      Recommendations for follow up therapy are one component of a multi-disciplinary discharge planning process, led by the attending physician.  Recommendations may be updated based on patient status, additional functional criteria and insurance authorization.  Follow Up Recommendations Home health PT      Assistance Recommended at Discharge Intermittent Supervision/Assistance  Patient can return home with the following  A little help with bathing/dressing/bathroom;Assistance with cooking/housework;Assistance with feeding;Help with stairs or ramp for entrance;Assist for transportation    Equipment Recommendations None recommended by PT  Recommendations  for Other Services       Functional Status Assessment Patient has had a recent decline in their functional status and demonstrates the ability to make significant improvements in function in a reasonable and predictable amount of time.     Precautions / Restrictions Precautions Precautions: Fall Restrictions Weight Bearing Restrictions: No      Mobility  Bed Mobility Overal bed mobility: Needs Assistance Bed Mobility: Supine to Sit, Sit to Supine     Supine to sit: Supervision Sit to supine: Supervision   General bed mobility comments: cued for hand placement    Transfers Overall transfer level: Needs assistance Equipment used: Rolling walker (2 wheels) Transfers: Sit to/from Stand Sit to Stand: Supervision           General transfer comment: 1 posterior LOB but able to correct    Ambulation/Gait Ambulation/Gait assistance: Min guard Gait Distance (Feet): 200 Feet           General Gait Details: instructed in 3 standing rest breaks for fatigue, activity tolerance and to assess spO2. ultimately needed 2L  Stairs            Wheelchair Mobility    Modified Rankin (Stroke Patients Only)       Balance Overall balance assessment: Needs assistance Sitting-balance support: Feet supported Sitting balance-Leahy Scale: Good       Standing balance-Leahy Scale: Good                               Pertinent Vitals/Pain Pain Assessment Pain Assessment: No/denies pain    Home Living Family/patient expects to be discharged to:: Private residence Living Arrangements: Spouse/significant other Available Help at Discharge: Family Type of Home: [place]  Access: Level entry       Home Layout: One level Home Equipment: Conservation officer, nature (2 wheels);Rollator (4 wheels);Shower seat;Grab bars - tub/shower;Grab bars - toilet      Prior Function Prior Level of Function : Needs assist;Driving             Mobility Comments: pt stated he is  independent for mobility at baseline. facility provides meals/meds/laundry/cleaning as well as physical assistance for his wife       Hand Dominance        Extremity/Trunk Assessment   Upper Extremity Assessment Upper Extremity Assessment: Overall WFL for tasks assessed    Lower Extremity Assessment Lower Extremity Assessment: Generalized weakness       Communication      Cognition Arousal/Alertness: Awake/alert Behavior During Therapy: WFL for tasks assessed/performed Overall Cognitive Status: Within Functional Limits for tasks assessed                                          General Comments      Exercises     Assessment/Plan    PT Assessment Patient needs continued PT services  PT Problem List Decreased strength;Decreased mobility;Decreased activity tolerance;Decreased balance;Pain;Decreased knowledge of use of DME       PT Treatment Interventions DME instruction;Therapeutic exercise;Gait training;Balance training;Stair training;Neuromuscular re-education;Functional mobility training;Therapeutic activities;Patient/family education    PT Goals (Current goals can be found in the Care Plan section)  Acute Rehab PT Goals Patient Stated Goal: to go home PT Goal Formulation: With patient Time For Goal Achievement: 04/25/22 Potential to Achieve Goals: Good    Frequency Min 2X/week     Co-evaluation               AM-PAC PT "6 Clicks" Mobility  Outcome Measure Help needed turning from your back to your side while in a flat bed without using bedrails?: None Help needed moving from lying on your back to sitting on the side of a flat bed without using bedrails?: None Help needed moving to and from a bed to a chair (including a wheelchair)?: None Help needed standing up from a chair using your arms (e.g., wheelchair or bedside chair)?: None Help needed to walk in hospital room?: None Help needed climbing 3-5 steps with a railing? : A  Little 6 Click Score: 23    End of Session Equipment Utilized During Treatment: Gait belt Activity Tolerance: Patient tolerated treatment well Patient left: with call bell/phone within reach;in bed;with bed alarm set Nurse Communication: Mobility status PT Visit Diagnosis: Other abnormalities of gait and mobility (R26.89);Difficulty in walking, not elsewhere classified (R26.2);Muscle weakness (generalized) (M62.81)    Time: 2694-8546 PT Time Calculation (min) (ACUTE ONLY): 19 min   Charges:   PT Evaluation $PT Eval Low Complexity: 1 Low PT Treatments $Therapeutic Activity: 8-22 mins       Lieutenant Diego PT, DPT 3:41 PM,04/11/22

## 2022-04-11 NOTE — Hospital Course (Signed)
Louis Sanchez was admitted to the hospital with the working diagnosis of decompensated heart failure.  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 Patient was transferred from ALF with respiratory distress. On his initial physical examination his blood pressure was 125/76, HR 78, RR 22 and 02 saturation 97%, lungs with decreased breath sounds with no wheezing, heart with S1 and S2 present irregularly irregular, abdomen with no distention, no lower extremity edema   Na 137, K 4,8 Cl 101 bicarbonate 28 glucose 104 bun 19 cr 0,83 BNP 1,017  High sensitive troponin 12 and 11  Wbc 8,8 hgb 9,4 plt 185  Sars covid 19 negative   Chest radiograph with cardiomegaly, bilateral hilar vascular congestion, small bilateral pleural effusions, right base with loss of lung volume.   EKG 84 bpm, normal axis, normal qtc, atrial fibrillation rhythm with no significant  ST segment or T wave changes.   Patient was placed on furosemide for diuresis.

## 2022-04-11 NOTE — Progress Notes (Addendum)
Nutrition Brief Note  RD pulled to chart secondary to CHF diagnosis.   Wt Readings from Last 15 Encounters:  04/11/22 72.5 kg  02/18/22 56.3 kg  02/12/22 61.2 kg  01/03/22 63 kg  11/19/21 61.1 kg  10/02/21 66.2 kg  09/29/21 66.2 kg  09/24/21 68.9 kg  04/27/21 61.2 kg  01/12/21 61.2 kg  12/15/20 65.4 kg  10/06/19 65.8 kg  09/20/16 72.6 kg  09/13/16 75.8 kg  11/25/15 72.6 kg   Pt with a complex medical h/o including Lung Cancer s/p RLL lobectomy, Hepatic carcinoma currently treated monthly at [facility], HFrEF, HTN, PAF. He presents to [facility] for evaluation of markedly increased SOB/increased WOB. Denies Chest pain, Fever, productive cough.    Pt admitted with CHF.   RD provided "Low Sodium Nutrition Therapy" handout from [facility] Nutrition Therapy" handout from [facility] Nutrition Care Manual.  Current diet order is regular, patient is consuming approximately n/a% of meals at this time. Labs and medications reviewed.   No nutrition interventions warranted at this time. If nutrition issues arise, please consult RD.   Louis Sanchez, RD, LDN, [place] Registered Dietitian II Certified Diabetes Care and Education Specialist Please refer to [facility] for RD and/or RD on-call/weekend/after hours pager

## 2022-04-11 NOTE — Assessment & Plan Note (Signed)
Continue with oral iron supplementation.

## 2022-04-11 NOTE — Assessment & Plan Note (Signed)
Continue with statin therapy.  ?

## 2022-04-11 NOTE — Progress Notes (Signed)
  Progress Note   Patient: Louis Sanchez:[id number] DOB: Sep 06, 1940 DOA: 04/10/2022     0 DOS: the patient was seen and examined on 04/11/2022   Brief hospital course: Mr. Sem was admitted to the hospital with the working diagnosis of decompensated heart failure.  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 Patient was transferred from ALF with respiratory distress. On his initial physical examination his blood pressure was 125/76, HR 78, RR 22 and 02 saturation 97%, lungs with decreased breath sounds with no wheezing, heart with S1 and S2 present irregularly irregular, abdomen with no distention, no lower extremity edema   Na 137, K 4,8 Cl 101 bicarbonate 28 glucose 104 bun 19 cr 0,83 BNP 1,017  High sensitive troponin 12 and 11  Wbc 8,8 hgb 9,4 plt 185  Sars covid 19 negative   Chest radiograph with cardiomegaly, bilateral hilar vascular congestion, small bilateral pleural effusions, right base with loss of lung volume.   EKG 84 bpm, normal axis, normal qtc, atrial fibrillation rhythm with no significant  ST segment or T wave changes.   Patient was placed on furosemide for diuresis.     Assessment and Plan: Acute on chronic systolic CHF (congestive heart failure) ([place]) Patient with improvement in volume status.  Urine output 7,600 ml over last 24 hrs Systolic blood pressure 81 to 97 mmHg.   Plan to hold on furosemide and losartan Follow up on new echocardiogram.  Out of bed as tolerated.  PT and OT.    Essential hypertension Patient with significant diuresis, his blood pressure is low this am, consistent with hypotension.  Plan to hold on antihypertensive medications and diuretics for today. Continue close blood pressure monitoring,   Chronic atrial fibrillation (HCC) Rate has been controlled.   For now will hold on B blocker to prevent hypotension.   Continue anticoagulation with rivaroxaban.   Iron deficiency anemia Continue with oral iron supplementation.   Hypothyroidism Continue with levothyroxine.   Dyslipidemia Continue with statin therapy .        Subjective: Patient is feeling better, edema has improved significantly   Physical Exam: Vitals:   04/11/22 0429 04/11/22 0740 04/11/22 1115 04/11/22 1220  BP: (!) 101/59 (!) 97/52 (!) 81/39   Pulse: 88 86 80   Resp: 20 18 18    Temp: 98.4 F (36.9 C) 98.9 F (37.2 C) 98.6 F (37 C)   TempSrc: Axillary Oral Oral   SpO2: 99% 96% 91% 90%  Weight: 72.5 kg     Height:       Neurology awake and alert ENT with mild pallor Cardiovascular with S1 and S2 present, irregularly irregular with no gallops or rubs No JVD Trace lower extremity edema Respiratory with scattered rales at bases with no wheezing Abdomen with no distention   Data Reviewed:    Family Communication: I spoke with patient's daughter at the bedside, we talked in detail about patient's condition, plan of care and prognosis and all questions were addressed.   Disposition: Status is: Inpatient Remains inpatient appropriate because: heart failure   Planned Discharge Destination: Home    Author: Tawni Millers, MD 04/11/2022 12:53 PM  For on call review [email or web address].

## 2022-04-11 NOTE — Assessment & Plan Note (Signed)
Continue with levothyroxine  

## 2022-04-12 DIAGNOSIS — I5023 Acute on chronic systolic (congestive) heart failure: Secondary | ICD-10-CM | POA: Diagnosis not present

## 2022-04-12 LAB — BASIC METABOLIC PANEL
Anion gap: 7 (ref 5–15)
BUN: 20 mg/dL (ref 8–23)
CO2: 32 mmol/L (ref 22–32)
Calcium: 7.9 mg/dL — ABNORMAL LOW (ref 8.9–10.3)
Chloride: 100 mmol/L (ref 98–111)
Creatinine, Ser: 0.67 mg/dL (ref 0.61–1.24)
GFR, Estimated: >60 mL/min (ref >60)
Glucose, Bld: 105 mg/dL — ABNORMAL HIGH (ref 70–99)
Potassium: 4.3 mmol/L (ref 3.5–5.1)
Sodium: 139 mmol/L (ref 135–145)

## 2022-04-12 LAB — MAGNESIUM: Magnesium: 2.3 mg/dL (ref 1.7–2.4)

## 2022-04-12 MED ORDER — FUROSEMIDE 40 MG PO TABS: 20.0000 mg | ORAL_TABLET | Freq: Two times a day (BID) | ORAL | 0 refills | Status: DC

## 2022-04-12 NOTE — TOC Initial Note (Addendum)
Transition of Care [facility]) - Initial/Assessment Note    Patient Details  Name: Louis Sanchez MRN: [id number] Date of Birth: 08-Jun-1941  Transition of Care [facility]) CM/SW Contact:    Alberteen Sam, LCSW Phone Number: 04/12/2022, 10:03 AM  Clinical Narrative:                  Patient from [facility], currently active with [facility] for PT and RN.    TOC will continue to follow for discharge planning needs, patient will need resumption orders at discharge for [facility] PT and RN.    Expected Discharge Plan: Assisted Living Barriers to Discharge: Continued Medical Work up   Patient Goals and CMS Choice Patient states their goals for this hospitalization and ongoing recovery are:: to go home CMS Medicare.gov Compare Post Acute Care list provided to:: Patient Choice offered to / list presented to : Patient  Expected Discharge Plan and Services Expected Discharge Plan: Assisted Living       Living arrangements for the past 2 months: [place] ([place])                           [place] Arranged: PT, RN [facility] Agency: [place] ([place]) Date HH Agency Contacted: 04/12/22 Time [place]: 1003 Representative spoke with at [place]: Corene Cornea  Prior Living Arrangements/Services Living arrangements for the past 2 months: [place] [employer])     Do you feel safe going back to the place where you live?: Yes               Activities of Daily Living Home Assistive Devices/Equipment: Bedside commode/3-in-1, Blood pressure cuff, Cane (specify quad or straight), Grab bars around toilet, Oxygen, CPAP, Walker (specify type) ADL Screening (condition at time of admission) Patient's cognitive ability adequate to safely complete daily activities?: Yes Is the patient deaf or have difficulty hearing?: No Does the patient have difficulty seeing, even when wearing glasses/contacts?: No Does the patient have difficulty  concentrating, remembering, or making decisions?: No Patient able to express need for assistance with ADLs?: Yes Does the patient have difficulty dressing or bathing?: Yes Independently performs ADLs?: No Communication: Independent Dressing (OT): Needs assistance Is this a change from baseline?: Change from baseline, expected to last >3 days Grooming: Needs assistance Is this a change from baseline?: Change from baseline, expected to last >3 days Feeding: Independent Bathing: Needs assistance Is this a change from baseline?: Change from baseline, expected to last >3 days Toileting: Needs assistance Is this a change from baseline?: Change from baseline, expected to last >3days In/Out Bed: Needs assistance Is this a change from baseline?: Change from baseline, expected to last >3 days Walks in Home: Independent Does the patient have difficulty walking or climbing stairs?: Yes Weakness of Legs: Both Weakness of Arms/Hands: None  Permission Sought/Granted                  Emotional Assessment              Admission diagnosis:  Acute respiratory failure with hypoxia ([place]) [J96.01] Visit for suture removal [Z48.02] Acute on chronic systolic (congestive) heart failure ([place]) [I50.23] Acute on chronic congestive heart failure, unspecified heart failure type ([place]) [I50.9] Heart failure ([place]) [I50.9] Patient Active Problem List   Diagnosis Date Noted   Heart failure ([place]) 04/11/2022   Iron deficiency anemia 04/11/2022   Acute on chronic systolic CHF (congestive heart  failure) ([place]) 04/10/2022   Symptomatic anemia 02/18/2022   Sepsis due to cellulitis ([place]) 02/18/2022   Essential hypertension 02/18/2022   Hypothyroidism 02/18/2022   GERD without esophagitis 02/18/2022   Dyslipidemia 02/18/2022   CAP (community acquired pneumonia) 02/18/2022   Ulcer of both feet ([place]) 01/12/2021   Right ventricular dysfunction 10/27/2020   Acquired thrombophilia ([place]) 10/07/2020   Chronic  systolic CHF (congestive heart failure) ([place]) 10/07/2020   Hydropneumothorax 04/08/2020   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/18/2019   Fusion of spine of cervical region 11/18/2019   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/18/2019   Neck pain 11/18/2019   Personal history of skin cancer 05/21/2019   Chronic anticoagulation 12/11/2016   Former tobacco use 12/11/2016   Malignant neoplasm of lower lobe of right lung ([place]) 96/75/9163   Alcoholic cirrhosis of liver without ascites ([place]) 10/07/2016   Aortic atherosclerosis ([place]) 10/07/2016   Hypothyroidism (acquired) 10/07/2016   Obstructive sleep apnea syndrome 10/07/2016   Thrombocytopenia ([place]) 10/07/2016   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   Mitral regurgitation 09/20/2016   TIA (transient ischemic attack) 09/20/2016   Liver masses 09/13/2016   Acute bronchitis 09/13/2016   Leukocytosis 09/13/2016   Venous stasis dermatitis 09/13/2016   LFT elevation    Alcoholic gastritis    Atrial fibrillation with RVR ([place]) 09/11/2016   Acute lower GI bleeding 05/23/2016   Anxiety, mild 05/23/2016   Hematochezia 05/23/2016   History of hypothyroidism 05/23/2016   Chronic atrial fibrillation ([place]) 05/03/2016   Ruptured varicose vein 01/25/2016   Varicose veins of both lower extremities with complications 84/66/5993   Mitral valve insufficiency 08/25/2014   Temporary cerebral vascular dysfunction 08/25/2014   Inguinal hernia 02/18/2013   Actinic keratosis 01/21/2010   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

## 2022-04-12 NOTE — NC FL2 (Signed)
[place] LEVEL OF CARE SCREENING TOOL     IDENTIFICATION  Patient Name: Louis Sanchez Birthdate: 1940/12/20 Sex: male Admission Date (Current Location): 04/10/2022  [facility] and [place] Number:  Engineering geologist and Address:  [facility], [address], [place], [place] [address]      Provider Number: [id number]  Attending Physician Name and Address:  Ezekiel Slocumb, DO  Relative Name and Phone Number:  Herbie Baltimore (son) [phone]    Current Level of Care: Hospital Recommended Level of Care: [facility]) Prior Approval Number:    Date Approved/Denied:   PASRR Number:    Discharge Plan: Other (Comment) ([facility] [place])    Current Diagnoses: Patient Active Problem List   Diagnosis Date Noted   Heart failure ([place]) 04/11/2022   Iron deficiency anemia 04/11/2022   Acute on chronic systolic CHF (congestive heart failure) ([place]) 04/10/2022   Symptomatic anemia 02/18/2022   Sepsis due to cellulitis ([place]) 02/18/2022   Essential hypertension 02/18/2022   Hypothyroidism 02/18/2022   GERD without esophagitis 02/18/2022   Dyslipidemia 02/18/2022   CAP (community acquired pneumonia) 02/18/2022   Ulcer of both feet ([place]) 01/12/2021   Right ventricular dysfunction 10/27/2020   Acquired thrombophilia ([place]) 31/51/7616   Chronic systolic CHF (congestive heart failure) ([place]) 10/07/2020   Hydropneumothorax 04/08/2020   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/18/2019   Fusion of spine of cervical region 11/18/2019   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/18/2019   Neck pain 11/18/2019   Personal history of skin cancer 05/21/2019   Chronic anticoagulation 12/11/2016   Former tobacco use 12/11/2016   Malignant neoplasm of lower lobe of right lung (HCC) 07/37/1062   Alcoholic cirrhosis of liver without ascites ([place]) 10/07/2016   Aortic atherosclerosis ([place]) 10/07/2016    Hypothyroidism (acquired) 10/07/2016   Obstructive sleep apnea syndrome 10/07/2016   Thrombocytopenia ([place]) 10/07/2016   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   Mitral regurgitation 09/20/2016   TIA (transient ischemic attack) 09/20/2016   Liver masses 09/13/2016   Acute bronchitis 09/13/2016   Leukocytosis 09/13/2016   Venous stasis dermatitis 09/13/2016   LFT elevation    Alcoholic gastritis    Atrial fibrillation with RVR ([place]) 09/11/2016   Acute lower GI bleeding 05/23/2016   Anxiety, mild 05/23/2016   Hematochezia 05/23/2016   History of hypothyroidism 05/23/2016   Chronic atrial fibrillation ([place]) 05/03/2016   Ruptured varicose vein 01/25/2016   Varicose veins of both lower extremities with complications 69/48/5462   Mitral valve insufficiency 08/25/2014   Temporary cerebral vascular dysfunction 08/25/2014   Inguinal hernia 02/18/2013   Actinic keratosis 01/21/2010   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   Benign neoplasm of colon 04/04/2008    Orientation RESPIRATION BLADDER Height & Weight        O2 (2L nasal cannula) Incontinent, External catheter Weight: 160 lb 7.9 oz (72.8 kg) Height:  5\' 6"  (167.6 cm)  BEHAVIORAL SYMPTOMS/MOOD NEUROLOGICAL BOWEL NUTRITION STATUS      Continent Diet low sodium heart healthy  AMBULATORY STATUS COMMUNICATION OF NEEDS Skin   Limited Assist Verbally Other (Comment) (abrasions, weeping foot)                       Personal Care Assistance Level of Assistance  Bathing, Dressing, Total care, Feeding Bathing Assistance: Independent Feeding assistance: Independent Dressing Assistance: Independent Total Care Assistance: Limited assistance   Functional Limitations Info  Sight, Hearing,  Speech Sight Info: Adequate Hearing Info: Adequate Speech Info: Adequate    SPECIAL CARE FACTORS FREQUENCY  PT (By licensed PT)     PT Frequency: home health PT - [facility]              Contractures Contractures Info:  Not present    Additional Factors Info  Code Status, Allergies Code Status Info: DNR Allergies Info: Atorvastatin   Levofloxacin            Discharge Medications: acetaminophen 650 MG CR tablet Commonly known as: TYLENOL Take 650 mg by mouth every 8 (eight) hours as needed for pain.    furosemide 40 MG tablet Commonly known as: LASIX Take 0.5 tablets (20 mg total) by mouth 2 (two) times daily. What changed: when to take this    gabapentin 300 MG capsule Commonly known as: NEURONTIN Take 300 mg by mouth 3 (three) times daily.    IRON PO Take 325 mg by mouth daily.    ketorolac 0.4 % Soln Commonly known as: ACULAR Place 1 drop into the right eye 4 (four) times daily.    levothyroxine 175 MCG tablet Commonly known as: SYNTHROID Take 175 mcg by mouth daily before breakfast.    multivitamin tablet Take 1 tablet by mouth daily.    nitroGLYCERIN 0.4 MG SL tablet Commonly known as: NITROSTAT Place under the tongue.    nitroGLYCERIN 0.4 MG SL tablet Commonly known as: NITROSTAT Place under the tongue.    prednisoLONE acetate 1 % ophthalmic suspension Commonly known as: PRED FORTE Place 1 drop into the right eye 4 (four) times daily.    rivaroxaban 20 MG Tabs tablet Commonly known as: XARELTO Take 20 mg by mouth daily with supper.    rosuvastatin 10 MG tablet Commonly known as: CRESTOR Take 10 mg by mouth daily.    sertraline 100 MG tablet Commonly known as: ZOLOFT Take 1 tablet by mouth daily.    silver sulfADIAZINE 1 % cream Commonly known as: SILVADENE Per instructions DAILY (route: topical)    tiotropium 18 MCG inhalation capsule Commonly known as: SPIRIVA Place 18 mcg into inhaler and inhale daily.            Relevant Imaging Results:  Relevant Lab Results:   Additional [place], LCSW

## 2022-04-12 NOTE — Progress Notes (Signed)
Visited patient for heart failure education and information about upcoming appointment with the heart failure clinic on 04/19/22.  Patient given verbal and written heart failure education, including the booklet, ' Living Better with Heart Failure'. We discussed daily weights, following a low sodium diet, and signs and symptoms of heart failure that should prompt a call to his doctor/the heart failure clinic. Patient was able to state back these signs. Patient states he has a scale at home and is currently weighing daily. He states he is following a low sodium diet. He states he sometimes has issues getting transportation to appointments. He states he often takes the bus, but its not always available.  Advised patient that we will call him the day before his appointment and if he is having difficulty arranging transportation, our office can help.  Patient verbalized understanding and states he will plan to be at appointment.  Georg Ruddle, RN

## 2022-04-12 NOTE — TOC Transition Note (Signed)
Transition of Care [facility]) - CM/SW Discharge Note   Patient Details  Name: Louis Sanchez MRN: [id number] Date of Birth: 05-17-1941  Transition of Care [facility]) CM/SW Contact:  Alberteen Sam, LCSW Phone Number: 04/12/2022, 12:56 PM   Clinical Narrative:     Patient to discharge back to [facility] today, dc summary and fl2 have been faxed to [phone]. RN to call report to [phone] ask for Merleen Nicely or Jamison Oka to [facility] to [facility], EMS forms on chart.   Final next level of care: Assisted Living Barriers to Discharge: No Barriers Identified   Patient Goals and CMS Choice Patient states their goals for this hospitalization and ongoing recovery are:: to go home CMS Medicare.gov Compare Post Acute Care list provided to:: Patient Choice offered to / list presented to : Patient  Discharge Placement                       Discharge Plan and Services                          HH Arranged: PT, RN [facility] Agency: [place] ([place]) Date [place]: 04/12/22 Time [place]: 1003 Representative spoke with at [place]: [place] ([place]) Interventions     Readmission Risk Interventions     No data to display

## 2022-04-12 NOTE — Discharge Summary (Addendum)
Physician Discharge Summary   Patient: Louis Sanchez MRN: [id number] DOB: 1940-12-16  Admit date:     04/10/2022  Discharge date: 04/12/22  Discharge Physician: Ezekiel Slocumb   PCP: Adin Hector, MD   Recommendations at discharge:   Follow up with Primary Care in 1-2 weeks Follow up with Cardiology as scheduled Repeat CMP, CBC, Mg in 1-2 weeks Follow up on Blood Pressure -- pt was given hold parameters for Lasix at discharge, losartan and spironolactone were held with BP's soft Follow up with Home Health for PT and nursing for medication management  Discharge Diagnoses: Active Problems:   Acute on chronic systolic CHF (congestive heart failure) (HCC)   Essential hypertension   Chronic atrial fibrillation (HCC)   Iron deficiency anemia   Hypothyroidism   Dyslipidemia  Resolved Problems:   * No resolved hospital problems. [facility] Course: Louis Sanchez was admitted to the hospital with the working diagnosis of decompensated heart failure.  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 Patient was transferred from ALF with respiratory distress. On his initial physical examination his blood pressure was 125/76, HR 78, RR 22 and 02 saturation 97%, lungs with decreased breath sounds with no wheezing, heart with S1 and S2 present irregularly irregular, abdomen with no distention, no lower extremity edema   Na 137, K 4,8 Cl 101 bicarbonate 28 glucose 104 bun 19 cr 0,83 BNP 1,017  High sensitive troponin 12 and 11  Wbc 8,8 hgb 9,4 plt 185  Sars covid 19 negative   Chest radiograph with cardiomegaly, bilateral hilar vascular congestion, small bilateral pleural effusions, right base with loss of lung volume.   EKG 84 bpm, normal axis, normal qtc, atrial fibrillation rhythm with no significant  ST segment or T  wave changes.   Patient was placed on furosemide for diuresis.    Clinically undetermined if pt actually had acute hypoxic respiratory failure based on charted vitals.  O2 sat lowest was 90% on room air.  Was given supplemental oxygen for comfort.   Assessment and Plan: Acute on chronic systolic CHF (congestive heart failure) ([place]) Patient with improvement in volume status.  Net IO Since Admission: -8,249 mL [04/12/22 1248]  Due to soft BP's, furosemide and losartan were held on 9/18.  BP's stable, and per patient are chronically on soft side.    At discharge -- Lasix continued with hold parameters for soft BP.  Losartan and spironolactone are held at discharge to avoid hypotension.  Recommend close outpatient follow up with PCP and Cardiology.   HH RN for assistance in medications and monitoring BP and volume status.  Pt instructed to resume daily weights and monitor BP, bring this info to follow up visits.   Evaluated by PT/OT and HH was recommended and arranged.     Essential hypertension BP's have been soft but MAP's stable. Holding off antihypertensive medications at discharge (spironolactone, losartan, metoprolol).   Continue Lasix with BP hold parameters. Monitor BP at home and close PCP follow up.  Chronic atrial fibrillation (HCC) Rate has been controlled.  Metoprolol on HOLD to prevent hypotension.  Continue anticoagulation with Xarelto.  Iron deficiency anemia Continue with oral iron supplementation.   Hypothyroidism Continue with levothyroxine.   Dyslipidemia Continue with statin therapy .         Consultants: None Procedures performed: Echo  Disposition: Assisted living with home health  Diet recommendation:  Discharge Diet Orders (From admission, onward)     Start     Ordered   04/12/22 0000  Diet - low sodium heart healthy        04/12/22 1049           Cardiac diet  DISCHARGE MEDICATION: Allergies as of 04/12/2022       Reactions    Atorvastatin    Other reaction(s): Muscle Pain Other reaction(s): Muscle Pain Other reaction(s): Muscle Pain Other reaction(s): Muscle Pain Other reaction(s): Muscle Pain   Levofloxacin Rash        Medication List     STOP taking these medications    losartan 25 MG tablet Commonly known as: COZAAR   metoprolol succinate 25 MG 24 hr tablet Commonly known as: TOPROL-XL   spironolactone 50 MG tablet Commonly known as: ALDACTONE       TAKE these medications    acetaminophen 650 MG CR tablet Commonly known as: TYLENOL Take 650 mg by mouth every 8 (eight) hours as needed for pain.   furosemide 40 MG tablet Commonly known as: LASIX Take 0.5 tablets (20 mg total) by mouth 2 (two) times daily. What changed: when to take this   gabapentin 300 MG capsule Commonly known as: NEURONTIN Take 300 mg by mouth 3 (three) times daily.   IRON PO Take 325 mg by mouth daily.   ketorolac 0.4 % Soln Commonly known as: ACULAR Place 1 drop into the right eye 4 (four) times daily.   levothyroxine 175 MCG tablet Commonly known as: SYNTHROID Take 175 mcg by mouth daily before breakfast.   multivitamin tablet Take 1 tablet by mouth daily.   nitroGLYCERIN 0.4 MG SL tablet Commonly known as: NITROSTAT Place under the tongue.   nitroGLYCERIN 0.4 MG SL tablet Commonly known as: NITROSTAT Place under the tongue.   prednisoLONE acetate 1 % ophthalmic suspension Commonly known as: PRED FORTE Place 1 drop into the right eye 4 (four) times daily.   rivaroxaban 20 MG Tabs tablet Commonly known as: XARELTO Take 20 mg by mouth daily with supper.   rosuvastatin 10 MG tablet Commonly known as: CRESTOR Take 10 mg by mouth daily.   sertraline 100 MG tablet Commonly known as: ZOLOFT Take 1 tablet by mouth daily.   silver sulfADIAZINE 1 % cream Commonly known as: SILVADENE Per instructions DAILY (route: topical)   tiotropium 18 MCG inhalation capsule Commonly known as:  SPIRIVA Place 18 mcg into inhaler and inhale daily.        Discharge Exam: Filed Weights   04/10/22 2000 04/11/22 0429 04/12/22 0521  Weight: 69.4 kg 72.5 kg 72.8 kg   General exam: awake, alert, no acute distress HEENT: atraumatic, clear conjunctiva, anicteric sclera, moist mucus membranes, hearing grossly normal  Respiratory system: CTAB diminished on left, no wheezes, normal respiratory effort. Cardiovascular system: normal S1/S2, RRR, no JVD, murmurs, rubs, gallops, no pedal edema.   Gastrointestinal system: soft, NT, ND, no HSM felt, +bowel sounds. Central nervous system: A&O x4. no gross focal neurologic deficits, normal speech Extremities: moves all, no edema, normal tone Skin: dry, intact, normal temperature, normal color, No rashes, lesions or ulcers Psychiatry: normal mood, congruent affect, judgement and insight appear normal   Condition at discharge: stable  The results of significant diagnostics from this hospitalization (including imaging, microbiology, ancillary and laboratory) are listed below for reference.   Imaging Studies: ECHOCARDIOGRAM COMPLETE  Result Date: 04/11/2022  ECHOCARDIOGRAM REPORT   Patient Name:   Louis Sanchez Date of Exam: 04/11/2022 Medical Rec #:  [id number]     Height:       66.0 in Accession #:    [id number]    Weight:       159.8 lb Date of Birth:  Jul 27, 1940     BSA:          1.818 m Patient Age:    63 years      BP:           97/52 mmHg Patient Gender: M             HR:           67 bpm. Exam Location:  [facility] Procedure: 2D Echo, Cardiac Doppler and Color Doppler Indications:     E42.35 CHF acute systolic  History:         Patient has no prior history of Echocardiogram examinations.                  CHF, Lung cancer, Arrythmias:Atrial Fibrillation; Risk                  Factors:Former Smoker, Hypertension, Dyslipidemia and Sleep                  Apnea.  Sonographer:     Rosalia Hammers Referring Phys:  Colstrip NORINS Diagnosing Phys: Serafina Royals MD  Sonographer Comments: Suboptimal apical window and suboptimal subcostal window. Image acquisition challenging due to patient body habitus and Image acquisition challenging due to respiratory motion. IMPRESSIONS  1. Left ventricular ejection fraction, by estimation, is 60 to 65%. The left ventricle has normal function. The left ventricle has no regional wall motion abnormalities. Left ventricular diastolic parameters were normal.  2. Right ventricular systolic function is normal. The right ventricular size is normal.  3. Left atrial size was mildly dilated.  4. Right atrial size was mildly dilated.  5. Large pleural effusion in the left lateral region.  6. The mitral valve is normal in structure. Moderate mitral valve regurgitation.  7. Tricuspid valve regurgitation is moderate.  8. The aortic valve is normal in structure. Aortic valve regurgitation is mild to moderate. FINDINGS  Left Ventricle: Left ventricular ejection fraction, by estimation, is 60 to 65%. The left ventricle has normal function. The left ventricle has no regional wall motion abnormalities. The left ventricular internal cavity size was small. There is no left ventricular hypertrophy. Left ventricular diastolic parameters were normal. Right Ventricle: The right ventricular size is normal. No increase in right ventricular wall thickness. Right ventricular systolic function is normal. Left Atrium: Left atrial size was mildly dilated. Right Atrium: Right atrial size was mildly dilated. Pericardium: There is no evidence of pericardial effusion. Mitral Valve: The mitral valve is normal in structure. Moderate mitral valve regurgitation. Tricuspid Valve: The tricuspid valve is normal in structure. Tricuspid valve regurgitation is moderate. Aortic Valve: The aortic valve is normal in structure. Aortic valve regurgitation is mild to moderate. Aortic regurgitation PHT measures 659 msec. Aortic valve mean gradient measures 6.0 mmHg. Aortic valve  peak gradient measures 11.7 mmHg. Aortic valve area, by VTI measures 1.97 cm. Pulmonic Valve: The pulmonic valve was normal in structure. Pulmonic valve regurgitation is trivial. Aorta: The aortic root and ascending aorta are structurally normal, with no evidence of dilitation. IAS/Shunts: No atrial level shunt detected by color flow Doppler. Additional Comments: There is a large pleural effusion in the  left lateral region.  LEFT VENTRICLE PLAX 2D LVIDd:         3.80 cm   Diastology LVIDs:         2.60 cm   LV e' medial:    8.59 cm/s LV PW:         1.20 cm   LV E/e' medial:  16.3 LV IVS:        1.30 cm   LV e' lateral:   12.40 cm/s LVOT diam:     2.00 cm   LV E/e' lateral: 11.3 LV SV:         64 LV SV Index:   35 LVOT Area:     3.14 cm  RIGHT VENTRICLE RV Basal diam:  3.60 cm RV Mid diam:    3.30 cm RV S prime:     11.20 cm/s TAPSE (M-mode): 1.2 cm LEFT ATRIUM              Index        RIGHT ATRIUM           Index LA diam:        4.50 cm  2.48 cm/m   RA Area:     36.90 cm LA Vol (A2C):   148.0 ml 81.41 ml/m  RA Volume:   123.00 ml 67.65 ml/m LA Vol (A4C):   142.0 ml 78.11 ml/m LA Biplane Vol: 151.0 ml 83.06 ml/m  AORTIC VALVE AV Area (Vmax):    2.04 cm AV Area (Vmean):   1.94 cm AV Area (VTI):     1.97 cm AV Vmax:           171.00 cm/s AV Vmean:          116.000 cm/s AV VTI:            0.324 m AV Peak Grad:      11.7 mmHg AV Mean Grad:      6.0 mmHg LVOT Vmax:         111.00 cm/s LVOT Vmean:        71.600 cm/s LVOT VTI:          0.203 m LVOT/AV VTI ratio: 0.63 AI PHT:            659 msec  AORTA Ao Root diam: 3.80 cm MITRAL VALVE                TRICUSPID VALVE MV Area (PHT): 4.10 cm     TR Peak grad:   30.9 mmHg MV Decel Time: 185 msec     TR Vmax:        278.00 cm/s MV E velocity: 140.00 cm/s                             SHUNTS                             Systemic VTI:  0.20 m                             Systemic Diam: 2.00 cm Serafina Royals MD Electronically signed by Serafina Royals MD Signature  Date/Time: 04/11/2022/2:00:45 PM    Final    DG Chest Portable 1 View  Result Date: 04/10/2022 CLINICAL DATA:  Short of breath. Patient on CPAP at night. History of CHF and lung carcinoma. EXAM: PORTABLE CHEST  1 VIEW COMPARISON:  02/18/2022.  CT, 10/02/2021. FINDINGS: Enlarged cardiac silhouette, stable. No mediastinal or hilar masses. There is opacity at both lung bases obscuring hemidiaphragms consistent with bilateral effusions. Additional opacity is noted in the right mid to lower lung, mildly increased when compared to the prior radiographs. Mild opacities noted at the left lung base consistent with atelectasis. No pneumothorax. Skeletal structures are grossly intact. IMPRESSION: 1. Bilateral pleural effusions, small to moderate size, right similar to the prior chest radiograph, left new. 2. Lung base opacities consistent with atelectasis. Additional hazy opacity and interstitial prominence in the right lung increased when compared to the prior chest CT. Findings may be infectious or inflammatory. Lymphangitic spread of carcinoma is not excluded. 3. Stable cardiomegaly.  No convincing pulmonary edema. Electronically Signed   By: Lajean Manes M.D.   On: 04/10/2022 15:40   CT Head Wo Contrast  Result Date: 03/31/2022 CLINICAL DATA:  Fall, laceration to the forehead. EXAM: CT HEAD WITHOUT CONTRAST CT CERVICAL SPINE WITHOUT CONTRAST TECHNIQUE: Multidetector CT imaging of the head and cervical spine was performed following the standard protocol without intravenous contrast. Multiplanar CT image reconstructions of the cervical spine were also generated. RADIATION DOSE REDUCTION: This exam was performed according to the departmental dose-optimization program which includes automated exposure control, adjustment of the mA and/or kV according to patient size and/or use of iterative reconstruction technique. COMPARISON:  CT examination dated October 02, 2021 FINDINGS: CT HEAD FINDINGS Brain: No evidence of acute  infarction, hemorrhage, hydrocephalus, extra-axial collection or mass lesion/mass effect. Diffuse low-attenuation of the periventricular white matter presumed chronic microvascular ischemic changes. Mild cerebral atrophy. Vascular: No hyperdense vessel or unexpected calcification. Skull: Normal. Negative for fracture or focal lesion. Sinuses/Orbits: No acute finding. Other: None. CT CERVICAL SPINE FINDINGS Alignment: Reversal of normal cervical lordosis. Grade 1 retrolisthesis at C4-C5. Skull base and vertebrae: No acute fracture. No primary bone lesion or focal pathologic process. Anterior cervical discectomy and fusion at C6-C7 with intact hardware. Soft tissues and spinal canal: No prevertebral fluid or swelling. No visible canal hematoma. Disc levels: Advanced multilevel degenerate disc disease with disc height loss and osteophytes. C2-C3: Uncovertebral joint arthropathy with mild left neural foraminal stenosis. C3-C4: Disc height loss with mild spinal canal stenosis. Mild bilateral neural foraminal stenosis. Moderate left facet joint arthropathy. C4-C5: Disc osteophyte complex with mild spinal canal stenosis. Moderate left and moderate-to-severe right neural foraminal stenosis with associated facet joint arthropathy. C5-C6: Disc osteophyte complex with mild spinal canal stenosis. Moderate left and moderate-to-severe right neural foraminal stenosis. C6-C7: Discectomy and fusion changes. No significant spinal canal or neural foraminal stenosis. C7-T1:  No significant finding. Upper chest: Biapical pleural/parenchymal scarring. No acute abnormality. Other: None IMPRESSION: CT head: 1. No acute intracranial abnormality. 2. Advanced chronic microvascular ischemic changes of the white matter and mild cerebral volume loss. 3. No significant soft tissue injury or calvarial fracture. CT cervical spine: 1. No acute fracture or traumatic subluxation. Mild retrolisthesis at C4 and reversal of normal cervical lordosis. 2.  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 Electronically Signed   By: Keane Police D.O.   On: 03/31/2022 22:44   CT Cervical Spine Wo Contrast  Result Date: 03/31/2022 CLINICAL DATA:  Fall, laceration to the forehead. EXAM: CT HEAD WITHOUT CONTRAST CT CERVICAL SPINE WITHOUT CONTRAST TECHNIQUE: Multidetector CT imaging of the head and cervical spine was performed following the standard protocol without intravenous  contrast. Multiplanar CT image reconstructions of the cervical spine were also generated. RADIATION DOSE REDUCTION: This exam was performed according to the departmental dose-optimization program which includes automated exposure control, adjustment of the mA and/or kV according to patient size and/or use of iterative reconstruction technique. COMPARISON:  CT examination dated October 02, 2021 FINDINGS: CT HEAD FINDINGS Brain: No evidence of acute infarction, hemorrhage, hydrocephalus, extra-axial collection or mass lesion/mass effect. Diffuse low-attenuation of the periventricular white matter presumed chronic microvascular ischemic changes. Mild cerebral atrophy. Vascular: No hyperdense vessel or unexpected calcification. Skull: Normal. Negative for fracture or focal lesion. Sinuses/Orbits: No acute finding. Other: None. CT CERVICAL SPINE FINDINGS Alignment: Reversal of normal cervical lordosis. Grade 1 retrolisthesis at C4-C5. Skull base and vertebrae: No acute fracture. No primary bone lesion or focal pathologic process. Anterior cervical discectomy and fusion at C6-C7 with intact hardware. Soft tissues and spinal canal: No prevertebral fluid or swelling. No visible canal hematoma. Disc levels: Advanced multilevel degenerate disc disease with disc height loss and osteophytes. C2-C3: Uncovertebral joint arthropathy with mild left neural foraminal stenosis. C3-C4: Disc height  loss with mild spinal canal stenosis. Mild bilateral neural foraminal stenosis. Moderate left facet joint arthropathy. C4-C5: Disc osteophyte complex with mild spinal canal stenosis. Moderate left and moderate-to-severe right neural foraminal stenosis with associated facet joint arthropathy. C5-C6: Disc osteophyte complex with mild spinal canal stenosis. Moderate left and moderate-to-severe right neural foraminal stenosis. C6-C7: Discectomy and fusion changes. No significant spinal canal or neural foraminal stenosis. C7-T1:  No significant finding. Upper chest: Biapical pleural/parenchymal scarring. No acute abnormality. Other: None IMPRESSION: CT head: 1. No acute intracranial abnormality. 2. Advanced chronic microvascular ischemic changes of the white matter and mild cerebral volume loss. 3. No significant soft tissue injury or calvarial fracture. CT cervical spine: 1. No acute fracture or traumatic subluxation. Mild retrolisthesis at C4 and reversal of normal cervical lordosis. 2. 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 Electronically Signed   By: Keane Police D.O.   On: 03/31/2022 22:44    Microbiology: Results for orders placed or performed during the hospital encounter of 04/10/22  SARS Coronavirus 2 by RT PCR (hospital order, performed in [facility] hospital lab) *cepheid single result test* Anterior Nasal Swab     Status: None   Collection Time: 04/10/22  3:39 PM   Specimen: Anterior Nasal Swab  Result Value Ref Range Status   SARS Coronavirus 2 by RT PCR NEGATIVE NEGATIVE Final    Comment: (NOTE) SARS-CoV-2 target nucleic acids are NOT DETECTED.  The SARS-CoV-2 RNA is generally detectable in upper and lower respiratory specimens during the acute phase of infection. The lowest concentration of SARS-CoV-2 viral copies this assay can detect is 250 copies / mL. A  negative result does not preclude SARS-CoV-2 infection and should not be used as the sole basis for treatment or other patient management decisions.  A negative result may occur with improper specimen collection / handling, submission of specimen other than nasopharyngeal swab, presence of viral mutation(s) within the areas targeted by this assay, and inadequate number of viral copies (<250 copies / mL). A negative result must be combined with clinical observations, patient history, and epidemiological information.  Fact Sheet for Patients:   https://www.patel.info/  Fact Sheet for Healthcare Providers: https://hall.com/  This test is not yet approved or  cleared by the [place] FDA and has been authorized for detection and/or diagnosis of  SARS-CoV-2 by FDA under an Emergency Use Authorization (EUA).  This EUA will remain in effect (meaning this test can be used) for the duration of the COVID-19 declaration under Section 564(b)(1) of the Act, 21 U.S.C. section 360bbb-3(b)(1), unless the authorization is terminated or revoked sooner.  Performed at [facility], [address] [place], [place] [address]     Labs: CBC: Recent Labs  Lab 04/10/22 1539  WBC 8.8  NEUTROABS 7.6  HGB 9.4*  HCT 33.4*  MCV 94.6  PLT 993   Basic Metabolic Panel: Recent Labs  Lab 04/10/22 1539 04/11/22 0416 04/12/22 0643  NA 137 139 139  K 4.8 3.8 4.3  CL 101 99 100  CO2 28 33* 32  GLUCOSE 104* 105* 105*  BUN 19 19 20   CREATININE 0.83 0.76 0.67  CALCIUM 8.2* 7.9* 7.9*  MG  --   --  2.3   Liver Function Tests: Recent Labs  Lab 04/10/22 1539  AST 28  ALT 18  ALKPHOS 110  BILITOT 0.8  PROT 7.4  ALBUMIN 3.2*   CBG: No results for input(s): "GLUCAP" in the last 168 hours.  Discharge time spent: greater than 30 minutes.  Signed: Ezekiel Slocumb, DO [facility] 04/12/2022

## 2022-04-18 ENCOUNTER — Encounter: Payer: Medicare Other | Attending: Physician Assistant | Admitting: Physician Assistant

## 2022-04-18 DIAGNOSIS — L97522 Non-pressure chronic ulcer of other part of left foot with fat layer exposed: Secondary | ICD-10-CM | POA: Diagnosis not present

## 2022-04-18 DIAGNOSIS — I890 Lymphedema, not elsewhere classified: Secondary | ICD-10-CM | POA: Diagnosis not present

## 2022-04-18 DIAGNOSIS — L97312 Non-pressure chronic ulcer of right ankle with fat layer exposed: Secondary | ICD-10-CM | POA: Diagnosis present

## 2022-04-18 DIAGNOSIS — I480 Paroxysmal atrial fibrillation: Secondary | ICD-10-CM | POA: Insufficient documentation

## 2022-04-18 NOTE — Progress Notes (Deleted)
   Patient ID: STEPHENSON CICHY, male    DOB: [birth year], 81 y.o.   MRN: [id number]  HPI  Mr Kirkpatrick is a 81 y/o male with a history of  Echo report from 04/11/22 reviewed and showed an EF of 60-65% along with mild LAE and moderate MR/TR.   Admitted 04/10/22 due to chest pain, cough and fever. Due to hypotension, meds were held with parameters at home. Discharged after 2 days.   He presents today for his initial visit with a chief complaint of   Review of Systems    Physical Exam    Assessment & Plan:  1: Chronic heart failure with preserved ejection fraction with structural changes (LAE)- - NYHA class - BNP 04/11/22 was 1033.9  2: HTN- - BP - saw PCP Caryl Comes) 04/14/22 - BMP 04/14/22 reviewed and showed sodium 139, potassium 4.3, creatinine 0.9 and GFR 86  3: Atrial fibrillation- - saw cardiology Ronalee Red) 10/25/21

## 2022-04-18 NOTE — Progress Notes (Addendum)
AARSH, FRISTOE ([id number]) [phone].pdf Page 1 of 7 Visit Report for 04/18/2022 Arrival Information Details Patient Name: Date of Service: Louis Sanchez 04/18/2022 1:00 PM Medical Record Number: [id number] Patient Account Number: [id number] Date of Birth/Sex: Treating RN: [birth year] (81 y.o. Louis Sanchez, Louis Sanchez Primary Care Lonnetta Kniskern: Louis Sanchez Other Clinician: Referring Joshuajames Moehring: Treating Landis Dowdy/Extender: Atilano Ina in Treatment: 4 Visit Information History Since Last Visit Added or deleted any medications: No Patient Arrived: Ambulatory Has Dressing in Place as Prescribed: Yes Arrival Time: 13:16 Pain Present Now: No Accompanied By: self Transfer Assistance: None Patient Identification Verified: Yes Secondary Verification Process Completed: Yes Patient Requires Transmission-Based Precautions: No Patient Has Alerts: No Electronic Signature(s) Signed: 04/18/2022 5:56:59 PM By: Gretta Cool, BSN, RN, CWS, Kim RN, BSN Entered By: Gretta Cool, BSN, RN, CWS, Kim on 04/18/2022 13:16:46 -------------------------------------------------------------------------------- Encounter Discharge Information Details Patient Name: Date of Service: Louis Sanchez, Louis Sanchez. 04/18/2022 1:00 PM Medical Record Number: [id number] Patient Account Number: [id number] Date of Birth/Sex: Treating RN: Jan 18, 1941 (81 y.o. Louis Sanchez Primary Care Deyonte Cadden: Louis Sanchez Other Clinician: Referring Zakariah Urwin: Treating Kamarius Buckbee/Extender: Atilano Ina in Treatment: 4 Encounter Discharge Information Items Post Procedure Vitals Discharge Condition: Stable Temperature (F): 97.7 Ambulatory Status: Ambulatory Pulse (bpm): 80 Discharge Destination: Home Respiratory Rate (breaths/min): 16 Transportation: Private Auto Blood Pressure (mmHg): 92/52 Schedule Follow-up Appointment: Yes Clinical Summary of Care: Electronic Signature(s) Signed: 04/18/2022 5:56:59 PM By: Gretta Cool,  BSN, RN, CWS, Kim RN, BSN Entered By: Gretta Cool, BSN, RN, CWS, Kim on 04/18/2022 [place], [place] ([id number]) [phone].pdf Page 2 of 7 -------------------------------------------------------------------------------- Lower Extremity Assessment Details Patient Name: Date of Service: Louis Sanchez. 04/18/2022 1:00 PM Medical Record Number: [id number] Patient Account Number: [id number] Date of Birth/Sex: Treating RN: [birth year] (81 y.o. Louis Sanchez Primary Care Nazaire Cordial: Louis Sanchez Other Clinician: Referring Jeorge Reister: Treating Alizee Maple/Extender: Alease Medina Weeks in Treatment: 4 Edema Assessment Assessed: Shirlyn Goltz: No] Patrice Paradise: Yes] Edema: [Left: N] [Right: o] Vascular Assessment Pulses: Dorsalis Pedis Palpable: [Left:Yes] [Right:Yes] Electronic Signature(s) Signed: 04/18/2022 5:56:59 PM By: Gretta Cool, BSN, RN, CWS, Kim RN, BSN Entered By: Gretta Cool, BSN, RN, CWS, Kim on 04/18/2022 13:54:18 -------------------------------------------------------------------------------- Multi Wound Chart Details Patient Name: Date of Service: Louis Sanchez, Louis Sanchez. 04/18/2022 1:00 PM Medical Record Number: [id number] Patient Account Number: [id number] Date of Birth/Sex: Treating RN: [birth year] (81 y.o. Louis Sanchez, Louis Sanchez Primary Care Liala Codispoti: Louis Sanchez Other Clinician: Referring Kaleab Frasier: Treating Eyoel Throgmorton/Extender: Atilano Ina in Treatment: 4 Vital Signs Height(in): 66 Pulse(bpm): 80 Weight(lbs): 134 Blood Pressure(mmHg): 92/52 Body Mass Index(BMI): 21.6 Temperature(F): 97.7 Respiratory Rate(breaths/min): 18 [1:Photos:] [N/A:N/A] Right, Medial Ankle N/A N/A Wound Location: Trauma N/A N/A Wounding Event: Venous Leg Ulcer N/A N/A Primary Etiology: Congestive Heart Failure, Myocardial N/A N/A Comorbid History: Infarction, Received Chemotherapy 02/23/2022 N/A N/A Date Acquired: 4 N/A N/A Weeks of Treatment: Open N/A N/A Wound Status: No N/A  N/A Wound Recurrence: 0.2x0.2x0.1 N/A N/A Measurements L x W x D (cm) 0.031 N/A N/A A (cm) : rea 0.003 N/A N/A Volume (cm) : 92.10% N/A N/A % Reduction in Area: 92.30% N/A N/A % Reduction in Volume: Full Thickness Without Exposed N/A N/A Classification: Support Structures Medium N/A N/A Exudate Amount: Serosanguineous N/A N/A Exudate Type: red, brown N/A N/A Exudate Color: Large (67-100%) N/A N/A Granulation Amount: Pink N/A N/A Granulation Quality: None Present (0%) N/A N/A Necrotic Amount: Fat Layer (Subcutaneous Tissue): Yes N/A N/A Exposed Structures: Fascia: No  Tendon: No Muscle: No Joint: No Bone: No Large (67-100%) N/A N/A Epithelialization: Treatment Notes Electronic Signature(s) Signed: 04/18/2022 5:56:59 PM By: Gretta Cool, BSN, RN, CWS, Kim RN, BSN Entered By: Gretta Cool, BSN, RN, CWS, Kim on 04/18/2022 13:46:08 -------------------------------------------------------------------------------- Multi-Disciplinary Care Plan Details Patient Name: Date of Service: Louis Sanchez, Louis Sanchez. 04/18/2022 1:00 PM Medical Record Number: [id number] Patient Account Number: [id number] Date of Birth/Sex: Treating RN: Feb 22, 1941 (81 y.o. Louis Sanchez Primary Care Hanna Aultman: Louis Sanchez Other Clinician: Referring Del Overfelt: Treating Marcayla Budge/Extender: Atilano Ina in Treatment: 4 Active Inactive Electronic Signature(s) Signed: 06/22/2022 3:33:48 PM By: Gretta Cool, BSN, RN, CWS, Kim RN, BSN Previous Signature: 04/18/2022 5:56:59 PM Version By: Gretta Cool, BSN, RN, CWS, Kim RN, BSN Entered By: Gretta Cool, BSN, RN, CWS, Kim on 06/22/2022 15:33:47 Berenice Bouton ([id number]) [id number].pdf Page 4 of 7 -------------------------------------------------------------------------------- Pain Assessment Details Patient Name: Date of Service: Louis Sanchez. 04/18/2022 1:00 PM Medical Record Number: [id number] Patient Account Number: [id number] Date of Birth/Sex:  Treating RN: 1940-10-12 (81 y.o. Louis Sanchez Primary Care Jasenia Weilbacher: Louis Sanchez Other Clinician: Referring Keionte Swicegood: Treating Keidan Aumiller/Extender: Atilano Ina in Treatment: 4 Active Problems Location of Pain Severity and Description of Pain Patient Has Paino Yes Site Locations Pain Location: Generalized Pain Rate the pain. Current Pain Level: 3 Pain Management and Medication Current Pain Management: Electronic Signature(s) Signed: 04/18/2022 5:56:59 PM By: Gretta Cool, BSN, RN, CWS, Kim RN, BSN Entered By: Gretta Cool, BSN, RN, CWS, Kim on 04/18/2022 13:17:26 -------------------------------------------------------------------------------- Patient/Caregiver Education Details Patient Name: Date of Service: Louis Sanchez, Florence Canner 9/25/2023andnbsp1:00 PM Medical Record Number: [id number] Patient Account Number: [id number] Date of Birth/Gender: Treating RN: 14-Aug-1940 (81 y.o. Louis Sanchez Primary Care Physician: Louis Sanchez Other Clinician: Referring Physician: Treating Physician/Extender: Atilano Ina in Treatment: 4 Louis Sanchez, Louis Sanchez ([id number]) [id number].pdf Page 5 of 7 Education Assessment Education Provided To: Patient Education Topics Provided Wound Debridement: Handouts: Wound Debridement Methods: Demonstration, Explain/Verbal Responses: State content correctly Electronic Signature(s) Signed: 04/18/2022 5:56:59 PM By: Gretta Cool, BSN, RN, CWS, Kim RN, BSN Entered By: Gretta Cool, BSN, RN, CWS, Kim on 04/18/2022 14:05:17 -------------------------------------------------------------------------------- Wound Assessment Details Patient Name: Date of Service: Louis Sanchez, Louis Sanchez. 04/18/2022 1:00 PM Medical Record Number: [id number] Patient Account Number: [id number] Date of Birth/Sex: Treating RN: 07-Jun-1941 (81 y.o. Louis Sanchez, Louis Sanchez Primary Care Desiree Fleming: Louis Sanchez Other Clinician: Referring Courteny Egler: Treating Mone Commisso/Extender: Alease Medina Weeks in Treatment: 4 Wound Status Wound Number: 1 Primary Venous Leg Ulcer Etiology: Wound Location: Right, Medial Ankle Wound Status: Open Wounding Event: Trauma Comorbid Congestive Heart Failure, Myocardial Infarction, Received Date Acquired: 02/23/2022 History: Chemotherapy Weeks Of Treatment: 4 Clustered Wound: No Photos Wound Measurements Length: (cm) 0.5 Width: (cm) 0.4 Depth: (cm) 0.3 Area: (cm) 0.157 Volume: (cm) 0.047 % Reduction in Area: 60.1% % Reduction in Volume: -20.5% Epithelialization: None Tunneling: No Undermining: No Wound Description Classification: Full Thickness Without Exposed Support Structures Exudate Amount: Medium Exudate Type: Serosanguineous Zito, Louis Sanchez ([id number]) Exudate Color: red, brown Foul Odor After Cleansing: No Slough/Fibrino Yes [phone].pdf Page 6 of 7 Wound Bed Granulation Amount: Small (1-33%) Exposed Structure Granulation Quality: Pink Fascia Exposed: No Necrotic Amount: Large (67-100%) Fat Layer (Subcutaneous Tissue) Exposed: Yes Necrotic Quality: Eschar Tendon Exposed: No Muscle Exposed: No Joint Exposed: No Bone Exposed: No Electronic Signature(s) Signed: 04/18/2022 5:56:59 PM By: Gretta Cool, BSN, RN, CWS, Kim RN, BSN Entered By: Gretta Cool, BSN, RN, CWS, Kim on 04/18/2022 13:51:11 -------------------------------------------------------------------------------- Wound  Assessment Details Patient Name: Date of Service: Louis Sanchez 04/18/2022 1:00 PM Medical Record Number: [id number] Patient Account Number: [id number] Date of Birth/Sex: Treating RN: 03/17/1941 (81 y.o. Louis Sanchez, Louis Sanchez Primary Care Decklyn Hyder: Louis Sanchez Other Clinician: Referring Zarai Orsborn: Treating Shanard Treto/Extender: Alease Medina Weeks in Treatment: 4 Wound Status Wound Number: 2 Primary Lymphedema Etiology: Wound Location: Left, Dorsal Foot Wound Status: Open Wounding Event: Gradually  Appeared Comorbid Congestive Heart Failure, Myocardial Infarction, Received Date Acquired: 04/04/2022 History: Chemotherapy Weeks Of Treatment: 0 Clustered Wound: Yes Photos Wound Measurements Length: (cm) Width: (cm) Depth: (cm) Clustered Quantity: Area: (cm) Volume: (cm) 3 % Reduction in Area: 2.5 % Reduction in Volume: 0.2 Epithelialization: None 2 Tunneling: No 5.89 Undermining: No 1.178 Wound Description Classification: Full Thickness Without Exposed Sup Wound Margin: Flat and Intact Exudate Amount: Medium Exudate Type: Serous Grave, Tyberius Sanchez ([id number]) Exudate Color: amber port Structures Foul Odor After Cleansing: No Slough/Fibrino Yes [phone].pdf Page 7 of 7 Wound Bed Granulation Amount: None Present (0%) Exposed Structure Necrotic Amount: Large (67-100%) Fascia Exposed: No Necrotic Quality: Adherent Slough Fat Layer (Subcutaneous Tissue) Exposed: Yes Tendon Exposed: No Muscle Exposed: No Joint Exposed: No Bone Exposed: No Electronic Signature(s) Signed: 04/18/2022 5:56:59 PM By: Gretta Cool, BSN, RN, CWS, Kim RN, BSN Entered By: Gretta Cool, BSN, RN, CWS, Kim on 04/18/2022 13:53:49 -------------------------------------------------------------------------------- Vitals Details Patient Name: Date of Service: Louis Sanchez, Louis Sanchez. 04/18/2022 1:00 PM Medical Record Number: [id number] Patient Account Number: [id number] Date of Birth/Sex: Treating RN: 1941/06/16 (81 y.o. Louis Sanchez, Louis Sanchez Primary Care Treyshawn Muldrew: Louis Sanchez Other Clinician: Referring Leilan Bochenek: Treating Jessah Danser/Extender: Atilano Ina in Treatment: 4 Vital Signs Time Taken: 13:16 Temperature (F): 97.7 Height (in): 66 Pulse (bpm): 80 Weight (lbs): 134 Respiratory Rate (breaths/min): 18 Body Mass Index (BMI): 21.6 Blood Pressure (mmHg): 92/52 Reference Range: 80 - 120 mg / dl Electronic Signature(s) Signed: 04/18/2022 5:56:59 PM By: Gretta Cool, BSN, RN, CWS, Kim RN,  BSN Entered By: Gretta Cool, BSN, RN, CWS, Kim on 04/18/2022 13:17:14

## 2022-04-18 NOTE — Progress Notes (Addendum)
OREST, Louis Sanchez ([id number]) Visit Report for 04/18/2022 Chief Complaint Document Details Patient Name: Louis Sanchez Date of Service: 04/18/2022 1:00 PM Medical Record Number: [id number] Patient Account Number: [id number] Date of Birth/Sex: [birth year] (81 y.o. M) Treating RN: Cornell Barman Primary Care Provider: Ramonita Lab Other Clinician: Referring Provider: Ramonita Lab Treating Provider/Extender: Skipper Cliche in Treatment: 4 Information Obtained from: Patient Chief Complaint Right ankle ulcer and left foot ulcer Electronic Signature(s) Signed: 04/18/2022 2:01:01 PM By: Worthy Keeler PA-C Previous Signature: 04/18/2022 1:32:56 PM Version By: Worthy Keeler PA-C Entered By: Worthy Keeler on 04/18/2022 14:01:01 Louis Sanchez ([id number]) -------------------------------------------------------------------------------- Debridement Details Patient Name: Louis Sanchez Date of Service: 04/18/2022 1:00 PM Medical Record Number: [id number] Patient Account Number: [id number] Date of Birth/Sex: [birth year] (81 y.o. M) Treating RN: Cornell Barman Primary Care Provider: Ramonita Lab Other Clinician: Referring Provider: Ramonita Lab Treating Provider/Extender: Jeri Cos Weeks in Treatment: 4 Debridement Performed for Wound #1 Right,Medial Ankle Assessment: Performed By: Physician Tommie Sams., PA-C Debridement Type: Debridement Severity of Tissue Pre Debridement: Fat layer exposed Level of Consciousness (Pre- Awake and Alert procedure): Pre-procedure Verification/Time Out Yes - 13:48 Taken: Total Area Debrided (L x W): 0.5 (cm) x 0.4 (cm) = 0.2 (cm) Tissue and other material Eschar, Slough, Subcutaneous, Slough debrided: Level: Skin/Subcutaneous Tissue Debridement Description: Excisional Instrument: Curette Bleeding: Minimum Hemostasis Achieved: Pressure Response to Treatment: Procedure was tolerated well Level of Consciousness (Post- Awake and Alert procedure): Post  Debridement Measurements of Total Wound Length: (cm) 0.5 Width: (cm) 0.4 Depth: (cm) 0.3 Volume: (cm) 0.047 Character of Wound/Ulcer Post Debridement: Stable Severity of Tissue Post Debridement: Fat layer exposed Post Procedure Diagnosis Same as Pre-procedure Electronic Signature(s) Signed: 04/18/2022 4:44:22 PM By: Worthy Keeler PA-C Signed: 04/18/2022 5:56:59 PM By: Gretta Cool, BSN, RN, CWS, Kim RN, BSN Entered By: Gretta Cool, BSN, RN, CWS, Kim on 04/18/2022 13:50:22 Louis Sanchez ([id number]) -------------------------------------------------------------------------------- HPI Details Patient Name: Louis Sanchez Date of Service: 04/18/2022 1:00 PM Medical Record Number: [id number] Patient Account Number: [id number] Date of Birth/Sex: 08/11/1940 (81 y.o. M) Treating RN: Cornell Barman Primary Care Provider: Ramonita Lab Other Clinician: Referring Provider: Ramonita Lab Treating Provider/Extender: Skipper Cliche in Treatment: 4 History of Present Illness HPI Description: 03-18-2022 upon evaluation today patient presents for initial inspection here in the clinic concerning issues that he has been having with wounds over the legs. With that being said the only wound right now that actually seems to be open is actually in the ankle area. 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 With that being said I do see evidence of the need for some sclerotherapy and to be honest he was seen by vascular on 03-01-2022 where they recommended going ahead and initiating treatment with sclerotherapy for the spider veins of the foot due to the fact that he keeps having ruptures which caused significant bleeding that ended up with him going to the hospital in the ER several times. The patient is on Xarelto since very hard to stop bleeding once it starts. With that being said currently he is not having anything actively bleeding in this regard which is great news the ankle ulcers along  with still remaining of several areas that opened. That is on the right medial ankle. Of note he has been to be moving into assisted living in the next week. 04-18-2022 upon evaluation today patient appears to be doing better but still somewhat dry in regard to the wounds  on the inner aspect of his right ankle region. He also has an area on the top of his left foot his right foot appears to be completely healed which is great news. Nonetheless he did need to tell us about left foot and came into the room. Electronic Signature(s) Signed: 04/18/2022 1:58:18 PM By: Worthy Keeler PA-C Entered By: Worthy Keeler on 04/18/2022 13:58:17 Louis Sanchez ([id number]) -------------------------------------------------------------------------------- Physical Exam Details Patient Name: Louis Sanchez Date of Service: 04/18/2022 1:00 PM Medical Record Number: [id number] Patient Account Number: [id number] Date of Birth/Sex: 08-Mar-1941 (81 y.o. M) Treating RN: Cornell Barman Primary Care Provider: Ramonita Lab Other Clinician: Referring Provider: Ramonita Lab Treating Provider/Extender: Jeri Cos Weeks in Treatment: 4 Constitutional Well-nourished and well-hydrated in no acute distress. Respiratory normal breathing without difficulty. Psychiatric this patient is able to make decisions and demonstrates good insight into disease process. Alert and Oriented x 3. pleasant and cooperative. Notes . Upon inspection patient's wound bed actually showed signs of good granulation and epithelization at this point. Fortunately I see no evidence of active infection locally or systemically at this time which is great news and overall I am very pleased in that regard although he does need to be keeping a dressing on this he did not have any dressing on the ankle area that needs to be covered. Electronic Signature(s) Signed: 04/18/2022 1:58:38 PM By: Worthy Keeler PA-C Entered By: Worthy Keeler on 04/18/2022  13:58:38 Louis Sanchez ([id number]) -------------------------------------------------------------------------------- Physician Orders Details Patient Name: Louis Sanchez Date of Service: 04/18/2022 1:00 PM Medical Record Number: [id number] Patient Account Number: [id number] Date of Birth/Sex: May 19, 1941 (81 y.o. 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 [facility] for wound care. May utilize formulary equivalent dressing for wound treatment orders unless otherwise specified. Home Health Nurse may visit PRN to address patientos wound care needs. - ADORATION Bathing/ Shower/ Hygiene o May shower; gently cleanse wound with antibacterial soap, rinse and pat dry prior to dressing wounds Anesthetic (Use 'Patient Medications' Section for Anesthetic Order Entry) o Lidocaine applied to wound bed Edema Control - Lymphedema / Segmental Compressive Device / Other o Patient to wear own compression stockings. Remove compression stockings every night before going to bed and put on every morning when getting up. o Patient to wear own Velcro compression garment. Remove compression stockings every night before going to bed and put on every morning when getting up. o  Elevate, Exercise Daily and Avoid Standing for Long Periods of Time. o Elevate legs to the level of the heart and pump ankles as often as possible Wound Treatment Wound #1 - Ankle Wound Laterality: Right, Medial Cleanser: Soap and Water 3 x Per Week/30 Days Discharge Instructions: Gently cleanse wound with antibacterial soap, rinse and pat dry prior to dressing wounds Primary Dressing: Xeroform 4x4-HBD (in/in) 3 x Per Week/30 Days Discharge Instructions: Apply  Xeroform 4x4-HBD (in/in) as directed Secondary Dressing: Coverlet Latex-Free Fabric Adhesive Dressings 3 x Per Week/30 Days Discharge Instructions: Knuckle Wound #2 - Foot Wound Laterality: Dorsal, Left Cleanser: Soap and Water 3 x Per Week/30 Days Discharge Instructions: Gently cleanse wound with antibacterial soap, rinse and pat dry prior to dressing wounds Primary Dressing: Xeroform 4x4-HBD (in/in) 3 x Per Week/30 Days Discharge Instructions: Apply Xeroform 4x4-HBD (in/in) as directed Secondary Dressing: Gauze (DME) (Generic) 3 x Per Week/30 Days Discharge Instructions: As directed: dry, moistened with saline or moistened with Dakins Solution Louis Sanchez, Louis C. ([id number]) Secured With: Coban Cohesive Bandage 4x5 (yds) Stretched (DME) (Generic) 3 x Per Week/30 Days Discharge Instructions: Apply Coban as directed. Electronic Signature(s) Signed: 04/18/2022 4:44:22 PM By: Worthy Keeler PA-C Signed: 04/18/2022 5:56:59 PM By: Gretta Cool BSN, RN, CWS, Kim RN, BSN Entered By: Gretta Cool, BSN, RN, CWS, Kim on 04/18/2022 14:04:36 Louis Sanchez ([id number]) -------------------------------------------------------------------------------- Problem List Details Patient Name: Louis Sanchez Date of Service: 04/18/2022 1:00 PM Medical Record Number: [id number] Patient Account Number: [id number] Date of Birth/Sex: [birth year] (81 y.o. 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 03/18/2022 No Yes I89.0 Lymphedema, not elsewhere classified 04/18/2022 No Yes L97.312 Non-pressure chronic ulcer of right ankle with fat layer exposed 03/18/2022 No Yes L97.522 Non-pressure chronic ulcer of other part of left foot with fat layer 04/18/2022 No Yes exposed I48.0 Paroxysmal atrial fibrillation 03/18/2022 No Yes Z79.01 Long term (current) use of anticoagulants 03/18/2022 No Yes Inactive Problems Resolved Problems Electronic Signature(s) Signed: 04/18/2022 2:00:49 PM By: Worthy Keeler PA-C Previous Signature: 04/18/2022 1:32:53 PM Version By: Worthy Keeler PA-C Entered By: Worthy Keeler on 04/18/2022 14:00:49 Louis Sanchez ([id number]) -------------------------------------------------------------------------------- Progress Note Details Patient Name: Louis Sanchez Date of Service: 04/18/2022 1:00 PM Medical Record Number: [id number] Patient Account Number: [id number] Date of Birth/Sex: 04/26/1941 (81 y.o. M) Treating RN: Cornell Barman Primary Care Provider: Ramonita Lab Other Clinician: Referring Provider: Ramonita Lab Treating Provider/Extender: Skipper Cliche in Treatment: 4 Subjective Chief Complaint Information obtained from Patient Right ankle ulcer and left foot ulcer History of Present Illness (HPI) 03-18-2022 upon evaluation today patient presents for initial inspection here in the clinic concerning issues that he has been having with wounds over the legs. With that being said the only wound right now that actually seems to be open is actually in the ankle area. 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 With that being  said I do see evidence of the need for some sclerotherapy and to be honest he was seen by vascular on 03-01-2022 where they recommended going ahead and initiating treatment with sclerotherapy for the spider veins of the foot due to the fact that he keeps having ruptures which caused significant bleeding that ended up with him going to the hospital in the ER several times. The patient is on Xarelto since very hard to stop bleeding once it starts. With that being said currently he is not having anything actively bleeding in this regard which is great news the ankle ulcers along with still remaining of several areas that opened. That is on the right medial ankle. Of note  he has been to be moving into assisted living in the next week. 04-18-2022 upon evaluation today patient appears to be doing better but still somewhat dry in regard to the wounds on the inner aspect of his right ankle region. He also has an area on the top of his left foot his right foot appears to be completely healed which is great news. Nonetheless he did need to tell us about left foot and came into the room. Objective Constitutional Well-nourished and well-hydrated in no acute distress. Vitals Time Taken: 1:16 PM, Height: 66 in, Weight: 134 lbs, BMI: 21.6, Temperature: 97.7 F, Pulse: 80 bpm, Respiratory Rate: 18 breaths/min, Blood Pressure: 92/52 mmHg. Respiratory normal breathing without difficulty. Psychiatric this patient is able to make decisions and demonstrates good insight into disease process. Alert and Oriented x 3. pleasant and cooperative. General Notes: . Upon inspection patient's wound bed actually showed signs of good granulation and epithelization at this point. Fortunately I see no evidence of active infection locally or systemically at this time which is great news and overall I am very pleased in that regard although he does need to be keeping a dressing on this he did not have any dressing on the ankle area  that needs to be covered. Integumentary (Hair, Skin) Wound #1 status is Open. Original cause of wound was Trauma. The date acquired was: 02/23/2022. The wound has been in treatment 4 weeks. The wound is located on the Right,Medial Ankle. The wound measures 0.5cm length x 0.4cm width x 0.3cm depth; 0.157cm^2 area and 0.047cm^3 volume. There is Fat Layer (Subcutaneous Tissue) exposed. There is no tunneling or undermining noted. There is a medium amount of serosanguineous drainage noted. There is small (1-33%) pink granulation within the wound bed. There is a large (67-100%) amount of necrotic tissue within the wound bed including Eschar. Wound #2 status is Open. Original cause of wound was Gradually Appeared. The date acquired was: 04/04/2022. The wound is located on the Left,Dorsal Foot. The wound measures 3cm length x 2.5cm width x 0.2cm depth; 5.89cm^2 area and 1.178cm^3 volume. There is Fat Layer (Subcutaneous Tissue) exposed. There is no tunneling or undermining noted. There is a medium amount of serous drainage noted. The wound margin is flat and intact. There is no granulation within the wound bed. There is a large (67-100%) amount of necrotic tissue within the wound bed including Adherent Slough. 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 There was a Excisional Skin/Subcutaneous Tissue Debridement with a total area of  0.2 sq cm performed by Tommie Sams., PA-C. With the following instrument(s): Curette Material removed includes Eschar, Subcutaneous Tissue, and Slough. No specimens were taken. A time out was conducted at 13:48, prior to the start of the procedure. A Minimum amount of bleeding was controlled with Pressure. The procedure was tolerated well. Post Debridement Measurements: 0.5cm length x 0.4cm width x 0.3cm depth; 0.047cm^3 volume. Character of Wound/Ulcer Post Debridement is stable. Severity of Tissue Post Debridement is: Fat  layer exposed. Post procedure Diagnosis Wound #1: Same as Pre-Procedure Plan Follow-up Appointments: Return Appointment in 2 weeks. Home Health: [facility] for [facility] care. May utilize formulary equivalent dressing for wound treatment orders unless otherwise specified. Home Health Nurse may visit PRN to address patient s wound care needs. - ADORATION Bathing/ Shower/ Hygiene: May shower; gently cleanse wound with antibacterial soap, rinse and pat dry prior to dressing wounds Anesthetic (Use 'Patient Medications' Section for Anesthetic Order Entry): Lidocaine applied to wound bed Edema Control - Lymphedema / Segmental Compressive Device / Other: Patient to wear own compression stockings. Remove compression stockings every night before going to bed and put on every morning when getting up. Patient to wear own Velcro compression garment. Remove compression stockings every night before going to bed and put on every morning when getting up. Elevate, Exercise Daily and Avoid Standing for Long Periods of Time. Elevate legs to the level of the heart and pump ankles as often as possible WOUND #1: - Ankle Wound Laterality: Right, Medial Cleanser: Soap and Water 3 x Per Week/30 Days Discharge Instructions: Gently cleanse wound with antibacterial soap, rinse and pat dry prior to dressing wounds Primary Dressing: Xeroform 4x4-HBD (in/in) 3 x Per Week/30 Days Discharge  Instructions: Apply Xeroform 4x4-HBD (in/in) as directed Secondary Dressing: Coverlet Latex-Free Fabric Adhesive Dressings 3 x Per Week/30 Days Discharge Instructions: Knuckle WOUND #2: - Foot Wound Laterality: Dorsal, Left Cleanser: Soap and Water 3 x Per Week/30 Days Discharge Instructions: Gently cleanse wound with antibacterial soap, rinse and pat dry prior to dressing wounds Primary Dressing: Xeroform 4x4-HBD (in/in) 3 x Per Week/30 Days Discharge Instructions: Apply Xeroform 4x4-HBD (in/in) as directed Secondary Dressing: Gauze (DME) (Generic) 3 x Per Week/30 Days Discharge Instructions: As directed: dry, moistened with saline or moistened with Dakins Solution Secured With: Coban Cohesive Bandage 4x5 (yds) Stretched (DME) (Generic) 3 x Per Week/30 Days Discharge Instructions: Apply Coban as directed. 1. I would recommend currently that we go ahead and continue with the recommendation for wound care measures as before and the patient is in agreement with plan this includes the use of the Xeroform gauze dressing which I think is still good to be the best way to go. 2. I am also can recommend that we have the patient continue to monitor for any signs of infection. Obviously if anything changes he should let Louis Sanchez, Louis C. ([id number]) me know but right now I really feel like the best thing is going to be keeping the dressings on the wounds and if that is done I think he will be in pretty good shape year. 3. I did perform a very light debridement in order to ensure that I have any bleeding due to the fact that he does have varicose veins that we will pop and bleed very easily. He tolerated this without any complication postdebridement the wound bed actually appears to be much better. Again there was no aggressive debridement undertaken here. We will see patient back for reevaluation in 1 week here in the clinic. If anything worsens or changes patient will contact our office for  additional recommendations. Electronic Signature(s) Signed: 04/18/2022 3:50:30 PM By: Worthy Keeler PA-C Previous Signature: 04/18/2022 2:02:51 PM Version By: Worthy Keeler PA-C Previous Signature: 04/18/2022 1:59:14 PM Version By: Worthy Keeler PA-C Entered By: Worthy Keeler on 04/18/2022 15:50:30 Louis Sanchez ([id number]) -------------------------------------------------------------------------------- SuperBill Details Patient Name: Louis Sanchez Date of Service: 04/18/2022 Medical Record Number: [id number] Patient Account Number: [id number]  Date of Birth/Sex: May 06, 1941 (81 y.o. 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 I48.0 Paroxysmal atrial fibrillation Z79.01 Long term (current) use of anticoagulants Facility Procedures CPT4 Code: [id number] Description: 11042 - DEB SUBQ TISSUE 20 SQ CM/< Modifier: Quantity: 1 CPT4 Code: Description: ICD-10 Diagnosis Description V07.460 Non-pressure chronic ulcer of right ankle with fat layer exposed L97.522 Non-pressure chronic ulcer of other part of left foot with fat layer exp Modifier: osed Quantity: Physician Procedures CPT4 Code: [id number] Description: 11042 - WC PHYS SUBQ TISS 20 SQ CM Modifier: Quantity: 1 CPT4 Code: Description: ICD-10 Diagnosis Description G85.694 Non-pressure chronic ulcer of right ankle with fat layer exposed L97.522 Non-pressure chronic ulcer of other part of left foot with fat  layer exp Modifier: osed Quantity: Electronic Signature(s) Signed: 04/18/2022 2:03:04 PM By: Worthy Keeler PA-C Entered By: Worthy Keeler on 04/18/2022 14:03:03

## 2022-04-19 ENCOUNTER — Ambulatory Visit: Payer: Medicare Other | Admitting: Family

## 2022-04-21 ENCOUNTER — Ambulatory Visit: Payer: Medicare Other | Attending: Family | Admitting: Family

## 2022-04-21 ENCOUNTER — Encounter: Payer: Self-pay | Admitting: Family

## 2022-04-21 VITALS — BP 115/51 | HR 82 | Resp 16 | Ht 66.0 in | Wt 140.2 lb

## 2022-04-21 DIAGNOSIS — R059 Cough, unspecified: Secondary | ICD-10-CM | POA: Insufficient documentation

## 2022-04-21 DIAGNOSIS — K219 Gastro-esophageal reflux disease without esophagitis: Secondary | ICD-10-CM | POA: Insufficient documentation

## 2022-04-21 DIAGNOSIS — R42 Dizziness and giddiness: Secondary | ICD-10-CM | POA: Diagnosis not present

## 2022-04-21 DIAGNOSIS — E079 Disorder of thyroid, unspecified: Secondary | ICD-10-CM | POA: Diagnosis not present

## 2022-04-21 DIAGNOSIS — Z87891 Personal history of nicotine dependence: Secondary | ICD-10-CM | POA: Insufficient documentation

## 2022-04-21 DIAGNOSIS — I10 Essential (primary) hypertension: Secondary | ICD-10-CM | POA: Diagnosis not present

## 2022-04-21 DIAGNOSIS — I482 Chronic atrial fibrillation, unspecified: Secondary | ICD-10-CM | POA: Diagnosis not present

## 2022-04-21 DIAGNOSIS — I110 Hypertensive heart disease with heart failure: Secondary | ICD-10-CM | POA: Insufficient documentation

## 2022-04-21 DIAGNOSIS — G4730 Sleep apnea, unspecified: Secondary | ICD-10-CM | POA: Diagnosis not present

## 2022-04-21 DIAGNOSIS — E785 Hyperlipidemia, unspecified: Secondary | ICD-10-CM | POA: Insufficient documentation

## 2022-04-21 DIAGNOSIS — G4733 Obstructive sleep apnea (adult) (pediatric): Secondary | ICD-10-CM

## 2022-04-21 DIAGNOSIS — R5383 Other fatigue: Secondary | ICD-10-CM | POA: Diagnosis not present

## 2022-04-21 DIAGNOSIS — R0602 Shortness of breath: Secondary | ICD-10-CM | POA: Insufficient documentation

## 2022-04-21 DIAGNOSIS — Z8673 Personal history of transient ischemic attack (TIA), and cerebral infarction without residual deficits: Secondary | ICD-10-CM | POA: Diagnosis not present

## 2022-04-21 DIAGNOSIS — I4891 Unspecified atrial fibrillation: Secondary | ICD-10-CM | POA: Insufficient documentation

## 2022-04-21 DIAGNOSIS — C7A8 Other malignant neuroendocrine tumors: Secondary | ICD-10-CM | POA: Diagnosis not present

## 2022-04-21 DIAGNOSIS — F419 Anxiety disorder, unspecified: Secondary | ICD-10-CM | POA: Insufficient documentation

## 2022-04-21 DIAGNOSIS — R062 Wheezing: Secondary | ICD-10-CM | POA: Insufficient documentation

## 2022-04-21 DIAGNOSIS — R600 Localized edema: Secondary | ICD-10-CM | POA: Diagnosis not present

## 2022-04-21 DIAGNOSIS — I5032 Chronic diastolic (congestive) heart failure: Secondary | ICD-10-CM | POA: Diagnosis present

## 2022-04-21 NOTE — Patient Instructions (Addendum)
Begin weighing daily and call for an overnight weight gain of 3 pounds or more or a weekly weight gain of more than 5 pounds.   If you have voicemail, please make sure your mailbox is cleaned out so that we may leave a message and please make sure to listen to any voicemails.     
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
## 2022-05-02 ENCOUNTER — Ambulatory Visit: Payer: Medicare Other | Admitting: Physician Assistant

## 2022-05-23 ENCOUNTER — Encounter (INDEPENDENT_AMBULATORY_CARE_PROVIDER_SITE_OTHER): Payer: Self-pay

## 2022-06-02 ENCOUNTER — Ambulatory Visit: Payer: Medicare Other | Admitting: Family

## 2022-06-12 NOTE — Progress Notes (Unsigned)
Patient ID: Louis Sanchez, male    DOB: 04/14/1941, 81 y.o.   MRN: [id number]  HPI  Louis Sanchez is a 81 y/o male with a history of atrial fibrillation, hyperlipidemia, TIA, anxiety, GERD, HTN, thyroid disease, sleep apnea, metastatic neuroendocrine tumor, previous tobacco use and chronic heart failure.   Echo report from 04/11/22 reviewed and showed an EF of 60-65% along with mild LAE and moderate Louis/TR.   Admitted 04/10/22 due to chest pain, cough and fever. Due to hypotension, meds were held with parameters at home. Discharged after 2 days.   He presents today for a follow-up visit with a chief complaint of minimal fatigue upon moderate exertion. Describes this as chronic in nature. He has associated cough, shortness of breath, pedal edema, light-headedness, chronic difficulty sleeping and gradual weight gain along with this. He denies any abdominal distention, palpitations, chest pain or wheezing.   Now taking torsemide instead of furosemide and feels like this is working better for him. Reports having a great appetite and is eating "like a horse". Not adding salt to his food but says that some of his food tastes salty to him.   Soon to be moving into ALF at [facility] from the independent living side. Has found a home for the cats but needs an order that he shouldn't have them due to his health issues.   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 [facility])    Other and unspecified hyperlipidemia 1997   Other specified disorder of male genital organs(608.89) 08/2012   Sleep disturbance, unspecified    Thyroid disease     Unspecified hypothyroidism 2005   Past Surgical History:  Procedure Laterality Date   APPENDECTOMY  1953   colon polyps  2009   HERNIA REPAIR  1957   Right   INGUINAL HERNIA REPAIR Right 2014   SPINE SURGERY  1997   neck   WRIST SURGERY     Family History  Problem Relation Age of Onset   Alzheimer's disease Mother    Social History   Tobacco Use   Smoking status: Former    Years: 25.00    Types: Cigarettes   Smokeless tobacco: Never  Substance Use Topics   Alcohol use: Yes    Alcohol/week: 15.0 standard drinks of alcohol    Types: 15 Standard drinks or equivalent per week   Allergies  Allergen Reactions   Atorvastatin     Other reaction(s): Muscle Pain Other reaction(s): Muscle Pain Other reaction(s): Muscle Pain Other reaction(s): Muscle Pain Other reaction(s): Muscle Pain    Levofloxacin Rash   Prior to Admission medications   Medication Sig Start Date End Date Taking? Authorizing Provider  Ferrous Sulfate (IRON PO) Take 325 mg by mouth daily.   Yes [provider]  gabapentin (NEURONTIN) 100 MG capsule Take 200 mg by mouth at bedtime.   Yes [provider]  gabapentin (NEURONTIN) 300 MG capsule Take 300 mg by mouth daily.   Yes [provider]  levothyroxine (SYNTHROID) 175 MCG tablet Take 175  mcg by mouth daily before breakfast. 12/30/20  Yes [provider]  Multiple Vitamin (MULTIVITAMIN) tablet Take 1 tablet by mouth daily.   Yes [provider]  rivaroxaban (XARELTO) 20 MG TABS tablet Take 20 mg by mouth daily with supper.   Yes [provider]  rosuvastatin (CRESTOR) 10 MG tablet Take 10 mg by mouth daily.   Yes [provider]  sertraline (ZOLOFT) 100 MG tablet Take 1.5 tablets by mouth daily. 01/21/13  Yes [provider]  tiotropium (SPIRIVA) 18 MCG inhalation capsule Place 18 mcg into inhaler and inhale daily.   Yes [provider]  torsemide (DEMADEX) 20 MG tablet Take 80 mg  by mouth daily.   Yes [provider]  acetaminophen (TYLENOL) 650 MG CR tablet Take 650 mg by mouth every 8 (eight) hours as needed for pain. Patient not taking: Reported on 06/13/2022    [provider]  nitroGLYCERIN (NITROSTAT) 0.4 MG SL tablet Place under the tongue. 09/01/20 09/24/21  [provider]  nitroGLYCERIN (NITROSTAT) 0.4 MG SL tablet Place under the tongue. Patient not taking: Reported on 06/13/2022 11/25/21 11/25/22  [provider]   Review of Systems  Constitutional:  Positive for fatigue. Negative for appetite change.  HENT:  Positive for hearing loss. Negative for congestion, sinus pressure and sore throat.   Eyes: Negative.   Respiratory:  Positive for cough (in the mornings) and shortness of breath. Negative for chest tightness and wheezing.   Cardiovascular:  Positive for leg swelling. Negative for chest pain and palpitations.  Gastrointestinal:  Negative for abdominal distention and abdominal pain.  Endocrine: Negative.   Genitourinary: Negative.   Musculoskeletal:  Negative for back pain and neck pain.  Skin: Negative.   Allergic/Immunologic: Negative.   Neurological:  Positive for light-headedness (in the mornings). Negative for dizziness.  Hematological:  Negative for adenopathy. Does not bruise/bleed easily.  Psychiatric/Behavioral:  Positive for sleep disturbance (sleeping on 1 pillow). Negative for dysphoric mood. The patient is not nervous/anxious.    Vitals:   06/13/22 1304  BP: 120/76  Pulse: 83  Resp: 16  SpO2: 99%  Weight: 148 lb 4 oz (67.2 kg)   Wt Readings from Last 3 Encounters:  06/13/22 148 lb 4 oz (67.2 kg)  04/21/22 140 lb 4 oz (63.6 kg)  04/12/22 160 lb 7.9 oz (72.8 kg)   Lab Results  Component Value Date   CREATININE 0.67 04/12/2022   CREATININE 0.76 04/11/2022   CREATININE 0.83 04/10/2022   Physical Exam Vitals and nursing note reviewed. Exam conducted with a chaperone present Probation officer).   Constitutional:      Appearance: Normal appearance.  HENT:     Head: Normocephalic and atraumatic.  Cardiovascular:     Rate and Rhythm: Normal rate. Rhythm irregular.  Pulmonary:     Effort: Pulmonary effort is normal. No respiratory distress.     Breath sounds: No wheezing or rales.  Abdominal:     General: There is no distension.     Palpations: Abdomen is soft.     Tenderness: There is no abdominal tenderness.  Musculoskeletal:        General: No tenderness.     Cervical back: Normal range of motion and neck supple.     Right lower leg: Edema (1+ pitting) present.     Left lower leg: Edema (1+ pitting) present.  Skin:    General: Skin is warm and dry.  Neurological:     General: No focal deficit present.  Mental Status: He is alert and oriented to person, place, and time.  Psychiatric:        Mood and Affect: Mood normal.        Behavior: Behavior normal.        Thought Content: Thought content normal.    Assessment & Plan:  1: Chronic heart failure with preserved ejection fraction with structural changes (LAE)- - NYHA class II - euvolemic today - weighing daily and weight chart reviewed; weight ranged from 142-147 pounds over the last wee; reminded to call for an overnight weight gain of > 2 pounds or a weekly weight gain of > 5 pounds - weight up 8 pounds from last visit here 2 months ago; says that he's "eating like a horse" - he does say that sometimes he gets weighed before he gets dressed and other times, it is after as it just depends on what time they get to him - not adding salt but says that some of his food tastes salty to him - order written that he shouldn't have the cats due to his health issues and that he no longer should be using febreeze aerosal spray - order written for compression socks to be put on every morning with removal at bedtime - could consider entresto and / or SGLT2 but need to be mindful of blood pressure as it's been lower in the  past - BNP 04/11/22 was 1033.9  2: HTN- - BP looks good (120/76) - saw PCP Caryl Comes) 04/28/22 - BMP 04/14/22 reviewed and showed sodium 139, potassium 4.3, creatinine 0.9 and GFR 86  3: Atrial fibrillation- - saw cardiology Ronalee Red) 10/25/21 - HR (I) today  4: Sleep apnea- - not wearing CPAP nightly as he says that he can't get a tight seal on his mask - says that he has to take the machine in to be looked at  5: 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   Return in 2 months, sooner if needed.

## 2022-06-13 ENCOUNTER — Ambulatory Visit: Payer: Medicare Other | Attending: Family | Admitting: Family

## 2022-06-13 ENCOUNTER — Encounter: Payer: Self-pay | Admitting: Family

## 2022-06-13 VITALS — BP 120/76 | HR 83 | Resp 16 | Wt 148.2 lb

## 2022-06-13 DIAGNOSIS — G4733 Obstructive sleep apnea (adult) (pediatric): Secondary | ICD-10-CM | POA: Diagnosis not present

## 2022-06-13 DIAGNOSIS — C7A8 Other malignant neuroendocrine tumors: Secondary | ICD-10-CM | POA: Diagnosis not present

## 2022-06-13 DIAGNOSIS — Z87891 Personal history of nicotine dependence: Secondary | ICD-10-CM | POA: Insufficient documentation

## 2022-06-13 DIAGNOSIS — G4730 Sleep apnea, unspecified: Secondary | ICD-10-CM | POA: Insufficient documentation

## 2022-06-13 DIAGNOSIS — I10 Essential (primary) hypertension: Secondary | ICD-10-CM

## 2022-06-13 DIAGNOSIS — I4891 Unspecified atrial fibrillation: Secondary | ICD-10-CM | POA: Insufficient documentation

## 2022-06-13 DIAGNOSIS — Z8673 Personal history of transient ischemic attack (TIA), and cerebral infarction without residual deficits: Secondary | ICD-10-CM | POA: Diagnosis not present

## 2022-06-13 DIAGNOSIS — C3431 Malignant neoplasm of lower lobe, right bronchus or lung: Secondary | ICD-10-CM

## 2022-06-13 DIAGNOSIS — I110 Hypertensive heart disease with heart failure: Secondary | ICD-10-CM | POA: Diagnosis present

## 2022-06-13 DIAGNOSIS — K219 Gastro-esophageal reflux disease without esophagitis: Secondary | ICD-10-CM | POA: Insufficient documentation

## 2022-06-13 DIAGNOSIS — I482 Chronic atrial fibrillation, unspecified: Secondary | ICD-10-CM

## 2022-06-13 DIAGNOSIS — E785 Hyperlipidemia, unspecified: Secondary | ICD-10-CM | POA: Insufficient documentation

## 2022-06-13 DIAGNOSIS — I5032 Chronic diastolic (congestive) heart failure: Secondary | ICD-10-CM | POA: Diagnosis not present

## 2022-06-13 DIAGNOSIS — F419 Anxiety disorder, unspecified: Secondary | ICD-10-CM | POA: Diagnosis not present

## 2022-06-13 NOTE — Patient Instructions (Addendum)
Continue weighing daily and call for an overnight weight gain of 3 pounds or more or a weekly weight gain of more than 5 pounds.   If you have voicemail, please make sure your mailbox is cleaned out so that we may leave a message and please make sure to listen to any voicemails.    Start putting on compression socks daily with removal at bedtime

## 2022-08-07 ENCOUNTER — Emergency Department: Payer: Medicare Other

## 2022-08-07 ENCOUNTER — Other Ambulatory Visit: Payer: Self-pay

## 2022-08-07 ENCOUNTER — Inpatient Hospital Stay
Admission: EM | Admit: 2022-08-07 | Discharge: 2022-08-10 | DRG: 291 | Disposition: A | Discharge status: Skilled Nursing Facility | Payer: Medicare Other | Attending: Internal Medicine | Admitting: Internal Medicine

## 2022-08-07 DIAGNOSIS — Z902 Acquired absence of lung [part of]: Secondary | ICD-10-CM

## 2022-08-07 DIAGNOSIS — Z79899 Other long term (current) drug therapy: Secondary | ICD-10-CM

## 2022-08-07 DIAGNOSIS — E785 Hyperlipidemia, unspecified: Secondary | ICD-10-CM | POA: Diagnosis present

## 2022-08-07 DIAGNOSIS — Z87891 Personal history of nicotine dependence: Secondary | ICD-10-CM

## 2022-08-07 DIAGNOSIS — L97519 Non-pressure chronic ulcer of other part of right foot with unspecified severity: Secondary | ICD-10-CM | POA: Diagnosis present

## 2022-08-07 DIAGNOSIS — Z8719 Personal history of other diseases of the digestive system: Secondary | ICD-10-CM

## 2022-08-07 DIAGNOSIS — Z86010 Personal history of colonic polyps: Secondary | ICD-10-CM

## 2022-08-07 DIAGNOSIS — I4821 Permanent atrial fibrillation: Secondary | ICD-10-CM | POA: Diagnosis present

## 2022-08-07 DIAGNOSIS — I5043 Acute on chronic combined systolic (congestive) and diastolic (congestive) heart failure: Secondary | ICD-10-CM | POA: Diagnosis present

## 2022-08-07 DIAGNOSIS — M7989 Other specified soft tissue disorders: Secondary | ICD-10-CM | POA: Diagnosis present

## 2022-08-07 DIAGNOSIS — E039 Hypothyroidism, unspecified: Secondary | ICD-10-CM | POA: Diagnosis present

## 2022-08-07 DIAGNOSIS — C3431 Malignant neoplasm of lower lobe, right bronchus or lung: Secondary | ICD-10-CM | POA: Diagnosis present

## 2022-08-07 DIAGNOSIS — C7B8 Other secondary neuroendocrine tumors: Secondary | ICD-10-CM | POA: Diagnosis present

## 2022-08-07 DIAGNOSIS — Z7901 Long term (current) use of anticoagulants: Secondary | ICD-10-CM

## 2022-08-07 DIAGNOSIS — K7030 Alcoholic cirrhosis of liver without ascites: Secondary | ICD-10-CM | POA: Diagnosis present

## 2022-08-07 DIAGNOSIS — I5023 Acute on chronic systolic (congestive) heart failure: Secondary | ICD-10-CM | POA: Diagnosis present

## 2022-08-07 DIAGNOSIS — I110 Hypertensive heart disease with heart failure: Secondary | ICD-10-CM | POA: Diagnosis not present

## 2022-08-07 DIAGNOSIS — R0602 Shortness of breath: Secondary | ICD-10-CM | POA: Diagnosis not present

## 2022-08-07 DIAGNOSIS — G4733 Obstructive sleep apnea (adult) (pediatric): Secondary | ICD-10-CM | POA: Diagnosis present

## 2022-08-07 DIAGNOSIS — I10 Essential (primary) hypertension: Secondary | ICD-10-CM | POA: Diagnosis present

## 2022-08-07 DIAGNOSIS — G629 Polyneuropathy, unspecified: Secondary | ICD-10-CM | POA: Diagnosis present

## 2022-08-07 DIAGNOSIS — S91309A Unspecified open wound, unspecified foot, initial encounter: Secondary | ICD-10-CM | POA: Diagnosis present

## 2022-08-07 DIAGNOSIS — G479 Sleep disorder, unspecified: Secondary | ICD-10-CM | POA: Diagnosis present

## 2022-08-07 DIAGNOSIS — K219 Gastro-esophageal reflux disease without esophagitis: Secondary | ICD-10-CM | POA: Diagnosis present

## 2022-08-07 DIAGNOSIS — E876 Hypokalemia: Secondary | ICD-10-CM | POA: Diagnosis present

## 2022-08-07 DIAGNOSIS — C7A8 Other malignant neuroendocrine tumors: Secondary | ICD-10-CM | POA: Diagnosis present

## 2022-08-07 DIAGNOSIS — Z881 Allergy status to other antibiotic agents status: Secondary | ICD-10-CM

## 2022-08-07 DIAGNOSIS — F419 Anxiety disorder, unspecified: Secondary | ICD-10-CM | POA: Diagnosis present

## 2022-08-07 DIAGNOSIS — Z79890 Hormone replacement therapy (postmenopausal): Secondary | ICD-10-CM

## 2022-08-07 DIAGNOSIS — Z888 Allergy status to other drugs, medicaments and biological substances status: Secondary | ICD-10-CM

## 2022-08-07 DIAGNOSIS — C787 Secondary malignant neoplasm of liver and intrahepatic bile duct: Secondary | ICD-10-CM | POA: Diagnosis present

## 2022-08-07 DIAGNOSIS — I509 Heart failure, unspecified: Secondary | ICD-10-CM

## 2022-08-07 DIAGNOSIS — I5033 Acute on chronic diastolic (congestive) heart failure: Secondary | ICD-10-CM

## 2022-08-07 DIAGNOSIS — Z20822 Contact with and (suspected) exposure to covid-19: Secondary | ICD-10-CM | POA: Diagnosis present

## 2022-08-07 DIAGNOSIS — R160 Hepatomegaly, not elsewhere classified: Secondary | ICD-10-CM | POA: Diagnosis present

## 2022-08-07 DIAGNOSIS — I872 Venous insufficiency (chronic) (peripheral): Secondary | ICD-10-CM | POA: Diagnosis present

## 2022-08-07 DIAGNOSIS — L97529 Non-pressure chronic ulcer of other part of left foot with unspecified severity: Secondary | ICD-10-CM | POA: Diagnosis present

## 2022-08-07 LAB — HEPATIC FUNCTION PANEL
ALT: 12 U/L (ref 0–44)
AST: 25 U/L (ref 15–41)
Albumin: 3.3 g/dL — ABNORMAL LOW (ref 3.5–5.0)
Alkaline Phosphatase: 93 U/L (ref 38–126)
Bilirubin, Direct: 0.2 mg/dL (ref 0.0–0.2)
Indirect Bilirubin: 0.6 mg/dL (ref 0.3–0.9)
Total Bilirubin: 0.8 mg/dL (ref 0.3–1.2)
Total Protein: 8.1 g/dL (ref 6.5–8.1)

## 2022-08-07 LAB — CBC
HCT: 32.2 % — ABNORMAL LOW (ref 39.0–52.0)
Hemoglobin: 9.7 g/dL — ABNORMAL LOW (ref 13.0–17.0)
MCH: 28.7 pg (ref 26.0–34.0)
MCHC: 30.1 g/dL (ref 30.0–36.0)
MCV: 95.3 fL (ref 80.0–100.0)
Platelets: 198 10*3/uL (ref 150–400)
RBC: 3.38 MIL/uL — ABNORMAL LOW (ref 4.22–5.81)
RDW: 19.1 % — ABNORMAL HIGH (ref 11.5–15.5)
WBC: 6.5 10*3/uL (ref 4.0–10.5)
nRBC: 0 % (ref 0.0–0.2)

## 2022-08-07 LAB — BASIC METABOLIC PANEL
Anion gap: 11 (ref 5–15)
BUN: 19 mg/dL (ref 8–23)
CO2: 31 mmol/L (ref 22–32)
Calcium: 8.4 mg/dL — ABNORMAL LOW (ref 8.9–10.3)
Chloride: 95 mmol/L — ABNORMAL LOW (ref 98–111)
Creatinine, Ser: 1 mg/dL (ref 0.61–1.24)
GFR, Estimated: >60 mL/min (ref >60)
Glucose, Bld: 92 mg/dL (ref 70–99)
Potassium: 3.1 mmol/L — ABNORMAL LOW (ref 3.5–5.1)
Sodium: 137 mmol/L (ref 135–145)

## 2022-08-07 LAB — MAGNESIUM: Magnesium: 2.5 mg/dL — ABNORMAL HIGH (ref 1.7–2.4)

## 2022-08-07 LAB — RESP PANEL BY RT-PCR (RSV, FLU A&B, COVID)  RVPGX2
Influenza A by PCR: NEGATIVE
Influenza B by PCR: NEGATIVE
Resp Syncytial Virus by PCR: NEGATIVE
SARS Coronavirus 2 by RT PCR: NEGATIVE

## 2022-08-07 LAB — BRAIN NATRIURETIC PEPTIDE: B Natriuretic Peptide: 651 pg/mL — ABNORMAL HIGH (ref 0.0–100.0)

## 2022-08-07 LAB — PROCALCITONIN: Procalcitonin: 0.23 ng/mL

## 2022-08-07 MED ORDER — FUROSEMIDE 10 MG/ML IJ SOLN
40.0000 mg | Freq: Two times a day (BID) | INTRAMUSCULAR | Status: AC
  Administered 2022-08-08 – 2022-08-09 (×3): 40 mg via INTRAVENOUS
  Filled 2022-08-07 (×3): qty 4

## 2022-08-07 MED ORDER — HYDRALAZINE HCL 20 MG/ML IJ SOLN: 5.0000 mg | Freq: Three times a day (TID) | INTRAMUSCULAR | Status: DC | PRN

## 2022-08-07 MED ORDER — RIVAROXABAN 20 MG PO TABS
20.0000 mg | ORAL_TABLET | Freq: Every day | ORAL | Status: DC
  Administered 2022-08-08 – 2022-08-09 (×3): 20 mg via ORAL
  Filled 2022-08-07 (×5): qty 1

## 2022-08-07 MED ORDER — ENOXAPARIN SODIUM 40 MG/0.4ML IJ SOSY: 40.0000 mg | PREFILLED_SYRINGE | INTRAMUSCULAR | Status: DC

## 2022-08-07 MED ORDER — ACETAMINOPHEN 650 MG RE SUPP: 650.0000 mg | Freq: Four times a day (QID) | RECTAL | Status: DC | PRN

## 2022-08-07 MED ORDER — TIOTROPIUM BROMIDE MONOHYDRATE 18 MCG IN CAPS
18.0000 ug | ORAL_CAPSULE | Freq: Every day | RESPIRATORY_TRACT | Status: DC
  Administered 2022-08-09 – 2022-08-10 (×2): 18 ug via RESPIRATORY_TRACT
  Filled 2022-08-07: qty 5

## 2022-08-07 MED ORDER — ADULT MULTIVITAMIN W/MINERALS CH
1.0000 | ORAL_TABLET | Freq: Every day | ORAL | Status: DC
  Administered 2022-08-08 – 2022-08-10 (×3): 1 via ORAL
  Filled 2022-08-07 (×3): qty 1

## 2022-08-07 MED ORDER — FUROSEMIDE 10 MG/ML IJ SOLN
80.0000 mg | Freq: Once | INTRAMUSCULAR | Status: AC
  Administered 2022-08-07: 80 mg via INTRAVENOUS
  Filled 2022-08-07 (×2): qty 8

## 2022-08-07 MED ORDER — ACETAMINOPHEN 325 MG PO TABS: 650.0000 mg | ORAL_TABLET | Freq: Four times a day (QID) | ORAL | Status: DC | PRN

## 2022-08-07 MED ORDER — GABAPENTIN 300 MG PO CAPS
300.0000 mg | ORAL_CAPSULE | Freq: Three times a day (TID) | ORAL | Status: DC
  Administered 2022-08-07 – 2022-08-10 (×8): 300 mg via ORAL
  Filled 2022-08-07 (×8): qty 1

## 2022-08-07 MED ORDER — POTASSIUM CHLORIDE 10 MEQ/100ML IV SOLN
10.0000 meq | INTRAVENOUS | Status: AC
  Administered 2022-08-07 (×2): 10 meq via INTRAVENOUS
  Filled 2022-08-07: qty 100

## 2022-08-07 MED ORDER — GABAPENTIN 100 MG PO CAPS: 200.0000 mg | ORAL_CAPSULE | Freq: Every day | ORAL | Status: DC

## 2022-08-07 MED ORDER — LEVOTHYROXINE SODIUM 50 MCG PO TABS
175.0000 ug | ORAL_TABLET | Freq: Every day | ORAL | Status: DC
  Administered 2022-08-08 – 2022-08-10 (×3): 175 ug via ORAL
  Filled 2022-08-07: qty 4
  Filled 2022-08-07 (×3): qty 1

## 2022-08-07 MED ORDER — ONDANSETRON HCL 4 MG/2ML IJ SOLN: 4.0000 mg | Freq: Four times a day (QID) | INTRAMUSCULAR | Status: DC | PRN

## 2022-08-07 MED ORDER — SPIRONOLACTONE 25 MG PO TABS
50.0000 mg | ORAL_TABLET | Freq: Every day | ORAL | Status: DC
  Administered 2022-08-08 – 2022-08-10 (×3): 50 mg via ORAL
  Filled 2022-08-07 (×3): qty 2

## 2022-08-07 MED ORDER — ONDANSETRON HCL 4 MG PO TABS: 4.0000 mg | ORAL_TABLET | Freq: Four times a day (QID) | ORAL | Status: DC | PRN

## 2022-08-07 MED ORDER — ROSUVASTATIN CALCIUM 10 MG PO TABS
10.0000 mg | ORAL_TABLET | Freq: Every day | ORAL | Status: DC
  Administered 2022-08-09 – 2022-08-10 (×2): 10 mg via ORAL
  Filled 2022-08-07 (×3): qty 1

## 2022-08-07 MED ORDER — NITROGLYCERIN 0.4 MG SL SUBL: 0.4000 mg | SUBLINGUAL_TABLET | SUBLINGUAL | Status: DC | PRN

## 2022-08-07 MED ORDER — SENNOSIDES-DOCUSATE SODIUM 8.6-50 MG PO TABS: 1.0000 | ORAL_TABLET | Freq: Every evening | ORAL | Status: DC | PRN

## 2022-08-07 MED ORDER — POTASSIUM CHLORIDE CRYS ER 20 MEQ PO TBCR
40.0000 meq | EXTENDED_RELEASE_TABLET | Freq: Once | ORAL | Status: AC
  Administered 2022-08-07: 40 meq via ORAL
  Filled 2022-08-07: qty 2
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
## 2022-08-07 NOTE — Assessment & Plan Note (Deleted)
-  Potassium chloride 40 mill equivalent p.o. one-time dose per EDP and potassium chloride 10 mill equivalent IV every hour, 2 doses ordered per EDP - Repeat BMP in the a.m.
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
## 2022-08-07 NOTE — H&P (Signed)
History and Physical   AIRON Sanchez LOV:[id number] DOB: [birth year] DOA: 08/07/2022  PCP: Louis Hector, MD  Patient coming from: home via EMS  I have personally briefly reviewed patient's old medical records in [place].  Chief Concern: Shortness of breath  HPI: 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  Initial vitals in the emergency department showed temperature of 97.7, respiration rate of 18, heart rate of 90, blood pressure 114/77, SpO2 of 95% on room air.  Serum sodium is 137, potassium 3.1, chloride 95, bicarb 31, BUN of 19, serum creatinine of 1.00, EGFR greater than 60, nonfasting blood glucose 92, WBC 6.5, hemoglobin 9.7, platelets of 198.  BNP is elevated at 651.  ED treatment: Furosemide 80 mg IV one-time dose, potassium chloride 40 mill equivalent p.o. one-time dose. --------------------------- At bedside patient was able to tell me his name, his age, the current location and the current calendar year.  He endorses worsening bilateral lower extremity drainage over the last few days with shortness of breath.  He endorses compliance with his home medication and he denies increased p.o. intake of fluid.  He denies fever, cough, chest pain, abdominal pain, dysuria, hematuria, diarrhea.  He endorses chronic chills.  Social history: Patient is from assisted living facility.  He denies tobacco, EtOH, recreational drug use.  ROS: Constitutional: no weight change, no fever ENT/Mouth: no sore throat, no rhinorrhea Eyes: no eye pain, no vision changes Cardiovascular: no chest pain, + dyspnea,  + edema, no palpitations Respiratory: no cough, no sputum, no  wheezing Gastrointestinal: no nausea, no vomiting, no diarrhea, no constipation Genitourinary: no urinary incontinence, no dysuria, no hematuria Musculoskeletal: no arthralgias, no myalgias Skin: no skin lesions, no pruritus, Neuro: + weakness, no loss of consciousness, no syncope Psych: no anxiety, no depression, no decrease appetite Heme/Lymph: no bruising, no bleeding  ED Course: Discussed with emergency medicine provider, patient requiring hospitalization for chief concerns of heart failure exacerbation.  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 [facility])   Malignant neoplasm of lower lobe of right lung (HCC)   Obstructive sleep apnea syndrome   Ulcer of both feet (HCC)   Permanent atrial fibrillation (HCC)   Hypokalemia   Assessment and Plan:  * Shortness of breath - Etiology workup in progress, differential diagnosis include heart failure exacerbation versus pneumonia - Check COVID/influenza A/influenza B/RSV, procalcitonin - Strict I's and O's - No indication for complete echo at this time as patient last had a complete echo on September 2023 - Agree with furosemide 80 mg IV one-time dose - Ordered furosemide 40 mg IV twice daily for 08/08/2022  Essential hypertension - Spironolactone 50 mg daily resumed - Hydralazine 5 mg IV every 8 hours as needed for SBP greater than 175, 4 days ordered  Hypokalemia - Check serum magnesium level - Potassium chloride 40 mill equivalent p.o. one-time dose per EDP and potassium chloride 10 mill equivalent IV every hour, 2 doses ordered per EDP - Repeat BMP  in the a.m.  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   DVT prophylaxis: Rivaroxaban 20 mg with supper Code Status: Full code Diet: Heart healthy Family Communication: No Disposition Plan: Pending clinical course Consults called: None at this time Admission status: Telemetry cardiac, observation  Past Medical History:  Diagnosis Date   Anxiety    Arrhythmia    atrial fibrillation   Arthritis    Benign neoplasm of colon 2004   Benign neoplasm of colon 04/04/2008   99mm tubular adenoma of the ascending colon   Cancer (HCC)    liver and lungs   CHF (congestive heart failure) (HCC)    GERD (gastroesophageal reflux disease)    Heart disease    Hypertension    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 His smoking use included cigarettes. He has never used smokeless tobacco. He reports current alcohol use of about 15.0 standard drinks of alcohol per week. He reports that he does not use drugs.  Allergies  Allergen Reactions   Atorvastatin     Other reaction(s): Muscle Pain Other reaction(s): Muscle Pain Other reaction(s): Muscle Pain Other  reaction(s): Muscle Pain Other reaction(s): Muscle Pain    Levofloxacin Rash   Family History  Problem Relation Age of Onset   Alzheimer's disease Mother    Family history: Family history reviewed and not pertinent.  Prior to Admission medications   Medication Sig Start Date End Date Taking? Authorizing Provider  furosemide (LASIX) 80 MG tablet Take 80 mg by mouth daily as needed. for Edema 03/30/22 03/30/23 Yes [provider]  spironolactone (ALDACTONE) 50 MG tablet Take 50 mg by mouth daily. 04/30/22  Yes [provider]  trimethoprim-polymyxin b (POLYTRIM) ophthalmic solution Place 1 drop into the right eye every 6 (six) hours. 05/08/22  Yes [provider]  acetaminophen (TYLENOL) 650 MG CR tablet Take 650 mg by mouth every 8 (eight) hours as needed for pain. Patient not taking: Reported on 06/13/2022    [provider]  Ferrous Sulfate (IRON PO) Take 325 mg by mouth daily.    [provider]  gabapentin (NEURONTIN) 100 MG capsule Take 200 mg by mouth at bedtime.    [provider]  gabapentin (NEURONTIN) 300 MG capsule Take 300 mg by mouth daily.    [provider]  levothyroxine (SYNTHROID) 175 MCG tablet Take 175 mcg by mouth daily before breakfast. 12/30/20   [provider]  Multiple Vitamin (MULTIVITAMIN) tablet Take 1 tablet by mouth daily.    [provider]  nitroGLYCERIN (NITROSTAT) 0.4 MG SL tablet Place under the tongue. 09/01/20 09/24/21  [provider]  nitroGLYCERIN (NITROSTAT) 0.4 MG SL tablet Place under the tongue. Patient not taking: Reported on 06/13/2022 11/25/21 11/25/22  [provider]  rivaroxaban (XARELTO) 20 MG TABS tablet Take 20 mg by mouth daily with supper.    [provider]  rosuvastatin (CRESTOR) 10 MG tablet Take 10 mg by mouth daily.    [provider]  sertraline (ZOLOFT) 100 MG tablet Take 1.5 tablets by mouth daily. 01/21/13   [provider]  tiotropium (SPIRIVA) 18 MCG inhalation capsule Place 18 mcg into inhaler and inhale daily.    [provider]  torsemide (DEMADEX) 20 MG tablet Take 80 mg by mouth daily.    [provider]   Physical Exam: Vitals:   08/07/22 1207 08/07/22 1209 08/07/22 1547  BP: 114/77  118/68  Pulse: 90  84  Resp: 18  19  Temp: 97.7 F (36.5 C)  97.7 F (36.5 C)  TempSrc: Oral  Oral  SpO2: 95%  93%  Weight:  68.9 kg   Height:  5\' 6"  (1.676 m)    Constitutional: appears age appropriate, frail, NAD, calm, comfortable Eyes: PERRL, lids and conjunctivae normal ENMT: Mucous membranes are moist. Posterior pharynx clear of any exudate or lesions. Age-appropriate dentition. Hearing appropriate Neck: normal, supple, no masses, no thyromegaly Respiratory: clear to auscultation bilaterally, no wheezing, no crackles. Normal respiratory effort. No accessory muscle use.  Cardiovascular: Regular rate and rhythm, no murmurs / rubs / gallops. Bilateral lower extremity edema. 2+ pedal pulses. No carotid bruits.  Abdomen: no tenderness, no masses palpated, no hepatosplenomegaly. Bowel sounds positive.  Musculoskeletal: no clubbing / cyanosis. No joint deformity upper and lower extremities. Good ROM, no contractures, no atrophy. Normal muscle tone.  Skin: Bilateral dorsal feet ulceration wounds  Neurologic: Sensation intact. Strength 5/5 in all 4.  Psychiatric: Normal judgment and insight. Alert and oriented x 3. Normal mood.   EKG: independently reviewed, showing atrial fibrillation with rate of 75, QTc 433  Chest x-ray on Admission: I personally reviewed and I agree with radiologist reading as below.  DG Chest 2 View  Result Date: 08/07/2022 CLINICAL DATA:  CHF EXAM: CHEST - 2 VIEW COMPARISON:  April 10, 2022, October 02, 2021 FINDINGS: The cardiomediastinal silhouette is unchanged and enlarged in contour. Moderate RIGHT pleural effusion with a small loculated component,  similar in comparison to prior but increased since March 2023. Decreased LEFT pleural effusion in comparison to prior. No pneumothorax. Persistent RIGHT basilar airspace opacity with reticular markings. Overall decrease of bilateral interstitial markings in comparison to prior. Visualized abdomen is unremarkable. Status post ACDF. IMPRESSION: 1. Moderate RIGHT pleural effusion with a small loculated component, similar in comparison to prior but increased since March 2023. 2. Decreased LEFT pleural effusion in comparison to prior. 3. Improved pulmonary edema comparison to prior from September. There may be a degree of residual mild underlying pulmonary edema. Electronically Signed   By: October M.D.   On: 08/07/2022 13:01    Labs on Admission: I have personally reviewed following labs  CBC: Recent Labs  Lab 08/07/22 1211  WBC 6.5  HGB 9.7*  HCT 32.2*  MCV 95.3  PLT 198   Basic Metabolic Panel: Recent Labs  Lab 08/07/22 1211  NA 137  K 3.1*  CL 95*  CO2 31  GLUCOSE 92  BUN 19  CREATININE 1.00  CALCIUM 8.4*  GFR: Estimated Creatinine Clearance: 53.2 mL/min (by C-G formula based on SCr of 1 mg/dL).  Liver Function Tests: Recent Labs  Lab 08/07/22 1211  AST 25  ALT 12  ALKPHOS 93  BILITOT 0.8  PROT 8.1  ALBUMIN 3.3*   Urine analysis:    Component Value Date/Time   COLORURINE AMBER (A) 09/11/2016 1441   APPEARANCEUR CLEAR (A) 09/11/2016 1441   LABSPEC 1.021 09/11/2016 1441   PHURINE 5.0 09/11/2016 1441   GLUCOSEU NEGATIVE 09/11/2016 1441   HGBUR NEGATIVE 09/11/2016 1441   BILIRUBINUR NEGATIVE 09/11/2016 1441   KETONESUR NEGATIVE 09/11/2016 1441   PROTEINUR NEGATIVE 09/11/2016 1441   NITRITE NEGATIVE 09/11/2016 1441   LEUKOCYTESUR NEGATIVE 09/11/2016 1441   This document was prepared using Dragon Voice Recognition software and may include unintentional dictation errors.  Dr. Sedalia Muta [facility]  If 7PM-7AM, please contact overnight-coverage  provider If 7AM-7PM, please contact day coverage provider [email or web address]  08/07/2022, 5:28 PM

## 2022-08-07 NOTE — Assessment & Plan Note (Signed)
Continue outpatient follow-up with oncology

## 2022-08-07 NOTE — Hospital Course (Addendum)
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    He was admitted to the hospital and started on IV diuresis for decompensated CHF.  Patient was diuresed down to a weight of 148.28.  Recommend doing daily weights.  Will continue Lasix 40 mg p.o. twice daily.  Patient was put back on spironolactone.  Will add a low low-dose Toprol XL.  The patient was not orthostatic upon discharge.  Pulse ox 91% with ambulation.  Patient was placed on empiric antibiotics just in case infection.  Will give 1 week of doxycycline.  Recommend following up with the wound care center.  Home health set up for daily dressing changes.

## 2022-08-07 NOTE — Consult Note (Signed)
WOC Nurse Consult Note: Reason for Consult:venous insufficiency with dorsal foot wounds related to edema, L>R. Photos provided to EMR by EDP today. Wound type:venous insufficiency Pressure Injury POA: N/A Measurement:To be obtained by Bedside RN with application of next dressing and documented on Nursing Flow Sheet Wound bed: left anterior (dorsal) foot: red, moist  right anterior (dorsal) foot:pink, dry Drainage (amount, consistency, odor) serous, moderate Periwound:macerated (left), intact, right Dressing procedure/placement/frequency:I have provided Nursing with guidance for the topical care of these wounds using a NS cleanse followed by dressing with size-appropriate pieces of calcium alginate to absorb exudate and contribute to tissue regeneration and would repair. This is to be covered with an ABD pad and secure with Kerlix roll gauze applied from just below toes to just below knees. The Kerlix is to be topped with a 6-inch ACE bandage applied in a similar manner. Heels are to be floated. A sacral foam is to be placed for PI prevention.  WOC nursing team will not follow, but will remain available to this patient, the nursing and medical teams.  Please re-consult if needed.  Thank you for inviting [place] to participate in this patient's Plan of Care.  Ladona Mow, MSN, RN, CNS, GNP, Leda Min, [employer], [employer] phone:  [phone]

## 2022-08-07 NOTE — Assessment & Plan Note (Signed)
Levothyroxine

## 2022-08-07 NOTE — Assessment & Plan Note (Deleted)
Likely due to CHF decompensation and pleural effusions. Continue mgmt as outlined for CHF.

## 2022-08-07 NOTE — Assessment & Plan Note (Addendum)
Replaced on admission and with spironolactone will hold off on potassium replacement as outpatient.  Recommend checking BMP.

## 2022-08-07 NOTE — ED Notes (Signed)
Dr. Fuller Plan at bedside. Pt denies CP, SOB, abd pain. Pt ambulates without device.   Pt noted to have wound on top of Left and Right foot, and smaller wound on side of heel on Right foot. Swelling noted to bilateral legs with above the knee swelling on Right leg.

## 2022-08-07 NOTE — ED Notes (Signed)
Walked patient down the hallway and back. Patient has to have assistance becomes unstable after about ten steps. O2 stays at 93.

## 2022-08-07 NOTE — ED Provider Notes (Signed)
[facility] Provider Note    Event Date/Time   First MD Initiated Contact with Patient 08/07/22 1501     (approximate)   History   Fluid Build Up   HPI  Louis Sanchez is a 82 y.o. male  lung cancer sp right lower lobe lobectomy, hepatic carcinoma, heart failure, hypertension and paroxysmal atrial fibrillation on Xarelto who comes in with concerns for fluid buildup.  Patient is on torsemide 80 BID he thinks.  He reports having fluid buildup on his legs and feet.  Patient reports that he is compliant with all of his medications.  He is in a assisted living and they given his meds there.  He reports increasing swelling in his legs and now having his blisters that are weeping.  He reports these blisters have been chronic but never had bleed like this before.  He reports that in September he was admitted for IV diuresis and this seemed to help his problem significantly.  He denies being able to see a wound care doctor.   Physical Exam   Triage Vital Signs: ED Triage Vitals  Enc Vitals Group     BP 08/07/22 1207 114/77     Pulse Rate 08/07/22 1207 90     Resp 08/07/22 1207 18     Temp 08/07/22 1207 97.7 F (36.5 C)     Temp Source 08/07/22 1207 Oral     SpO2 08/07/22 1207 95 %     Weight 08/07/22 1209 152 lb (68.9 kg)     Height 08/07/22 1209 5\' 6"  (1.676 m)     Head Circumference --      Peak Flow --      Pain Score 08/07/22 1209 8     Pain Loc --      Pain Edu? --      Excl. in GC? --     Most recent vital signs: Vitals:   08/07/22 1207  BP: 114/77  Pulse: 90  Resp: 18  Temp: 97.7 F (36.5 C)  SpO2: 95%     General: Awake, no distress.  CV:  Good peripheral perfusion.  Resp:  Normal effort.  Decreased breath sounds on the right Abd:  No distention.  Soft and nontender Other:  1+ edema bilaterally with some wounds noted to his bilateral tops of his feet with some weeping noted from them.  No redness or erythema.  2+ distal pulses.   ED  Results / Procedures / Treatments   Labs (all labs ordered are listed, but only abnormal results are displayed) Labs Reviewed  BASIC METABOLIC PANEL - Abnormal; Notable for the following components:      Result Value   Potassium 3.1 (*)    Chloride 95 (*)    Calcium 8.4 (*)    All other components within normal limits  CBC - Abnormal; Notable for the following components:   RBC 3.38 (*)    Hemoglobin 9.7 (*)    HCT 32.2 (*)    RDW 19.1 (*)    All other components within normal limits  BRAIN NATRIURETIC PEPTIDE  HEPATIC FUNCTION PANEL     EKG  My interpretation of EKG:  Atrial fibrillation with a rate of 75 without any ST elevation or T wave inversions, normal intervals  RADIOLOGY I have reviewed the xray personally and interpreted patient has a moderate right pleural effusion as well as some mild pulmonary edema   PROCEDURES:  Critical Care performed: No  Procedures  MEDICATIONS ORDERED IN ED: Medications  potassium chloride SA (KLOR-CON M) CR tablet 40 mEq (has no administration in time range)  potassium chloride 10 mEq in 100 mL IVPB (has no administration in time range)  furosemide (LASIX) injection 80 mg (has no administration in time range)     IMPRESSION / MDM / ASSESSMENT AND PLAN / ED COURSE  I reviewed the triage vital signs and the nursing notes.   Patient's presentation is most consistent with severe exacerbation of chronic illness.   Suspect this is most likely from CHF and increasing fluid.  Patient is already on high-dose torsemide and has got borderline blood pressures making it difficult to diurese him at home.  Doubt DVT given that he is on a blood thinner.  Denies any falls or hitting his head to suggest intracranial hemorrhage.  Patient x-ray does show some mild edema.  I think that patient could benefit from close IV diuresis in the hospital as well as wound consult in the morning given they have been unable to follow-up outpatient with wound  care team.  Labs show low potassium.  CBC shows hemoglobin around baseline.  No signs for white count to suggest infection BNP is elevated but down from September.  Liver test are at baseline.  Patient ambulated and took 10 steps and was short of breath and seemed wobbly in nature.  They did not proceed with walking further and oxygen levels are gone down to 93%.  Discussed with family and they felt more comfortable with IV diuresis in the hospital I think this is reasonable given borderline blood pressure.  Explained to them that I do not feel like he has as much fluid as he did back in September when he was admitted but given his hypokalemia, borderline blood pressures IV diuresis in the hospital would be the safest course and he can hopefully see a wound care doctor tomorrow to help with dressings of his feet.  They expressed understanding and felt comfortable with this plan.    FINAL CLINICAL IMPRESSION(S) / ED DIAGNOSES   Final diagnoses:  Acute on chronic congestive heart failure, unspecified heart failure type (HCC)     Rx / DC Orders   ED Discharge Orders     None        Note:  This document was prepared using Dragon voice recognition software and may include unintentional dictation errors.   Concha Se, MD 08/07/22 1600

## 2022-08-07 NOTE — Assessment & Plan Note (Addendum)
The patient was not orthostatic on the day of discharge.  Will decrease spironolactone dose down to 25 mg daily since he was not taking the 50 mg daily as outpatient.  Added low-dose Toprol XL.

## 2022-08-07 NOTE — Assessment & Plan Note (Addendum)
Lower for to wound care center as outpatient.  Empiric doxycycline for 1 week.  Dressing changes daily and as needed.

## 2022-08-07 NOTE — ED Notes (Signed)
Took patient to the restroom. Patient became unstable getting on the toilet and almost fell off toilet. He states his legs hurt so bad and he can't control them.

## 2022-08-07 NOTE — Assessment & Plan Note (Addendum)
Continue Xarelto for anticoagulation will use low-dose Toprol-XL at night for rate control.

## 2022-08-08 ENCOUNTER — Encounter: Payer: Self-pay | Admitting: Internal Medicine

## 2022-08-08 ENCOUNTER — Other Ambulatory Visit: Payer: Self-pay

## 2022-08-08 DIAGNOSIS — R0602 Shortness of breath: Secondary | ICD-10-CM | POA: Diagnosis present

## 2022-08-08 DIAGNOSIS — I4821 Permanent atrial fibrillation: Secondary | ICD-10-CM | POA: Diagnosis present

## 2022-08-08 DIAGNOSIS — I509 Heart failure, unspecified: Secondary | ICD-10-CM | POA: Diagnosis not present

## 2022-08-08 DIAGNOSIS — Z20822 Contact with and (suspected) exposure to covid-19: Secondary | ICD-10-CM | POA: Diagnosis present

## 2022-08-08 DIAGNOSIS — C3431 Malignant neoplasm of lower lobe, right bronchus or lung: Secondary | ICD-10-CM | POA: Diagnosis present

## 2022-08-08 DIAGNOSIS — G479 Sleep disorder, unspecified: Secondary | ICD-10-CM | POA: Diagnosis present

## 2022-08-08 DIAGNOSIS — I5043 Acute on chronic combined systolic (congestive) and diastolic (congestive) heart failure: Secondary | ICD-10-CM | POA: Diagnosis present

## 2022-08-08 DIAGNOSIS — E785 Hyperlipidemia, unspecified: Secondary | ICD-10-CM | POA: Diagnosis present

## 2022-08-08 DIAGNOSIS — Z79890 Hormone replacement therapy (postmenopausal): Secondary | ICD-10-CM | POA: Diagnosis not present

## 2022-08-08 DIAGNOSIS — E039 Hypothyroidism, unspecified: Secondary | ICD-10-CM | POA: Diagnosis present

## 2022-08-08 DIAGNOSIS — C7A8 Other malignant neuroendocrine tumors: Secondary | ICD-10-CM | POA: Diagnosis present

## 2022-08-08 DIAGNOSIS — Z87891 Personal history of nicotine dependence: Secondary | ICD-10-CM | POA: Diagnosis not present

## 2022-08-08 DIAGNOSIS — G4733 Obstructive sleep apnea (adult) (pediatric): Secondary | ICD-10-CM | POA: Diagnosis present

## 2022-08-08 DIAGNOSIS — I872 Venous insufficiency (chronic) (peripheral): Secondary | ICD-10-CM | POA: Diagnosis present

## 2022-08-08 DIAGNOSIS — Z7901 Long term (current) use of anticoagulants: Secondary | ICD-10-CM | POA: Diagnosis not present

## 2022-08-08 DIAGNOSIS — I10 Essential (primary) hypertension: Secondary | ICD-10-CM | POA: Diagnosis not present

## 2022-08-08 DIAGNOSIS — E876 Hypokalemia: Secondary | ICD-10-CM | POA: Diagnosis present

## 2022-08-08 DIAGNOSIS — M7989 Other specified soft tissue disorders: Secondary | ICD-10-CM | POA: Diagnosis present

## 2022-08-08 DIAGNOSIS — F419 Anxiety disorder, unspecified: Secondary | ICD-10-CM | POA: Diagnosis present

## 2022-08-08 DIAGNOSIS — I110 Hypertensive heart disease with heart failure: Secondary | ICD-10-CM | POA: Diagnosis present

## 2022-08-08 DIAGNOSIS — I5033 Acute on chronic diastolic (congestive) heart failure: Secondary | ICD-10-CM | POA: Diagnosis not present

## 2022-08-08 DIAGNOSIS — C787 Secondary malignant neoplasm of liver and intrahepatic bile duct: Secondary | ICD-10-CM | POA: Diagnosis present

## 2022-08-08 DIAGNOSIS — L97529 Non-pressure chronic ulcer of other part of left foot with unspecified severity: Secondary | ICD-10-CM | POA: Diagnosis present

## 2022-08-08 DIAGNOSIS — K7030 Alcoholic cirrhosis of liver without ascites: Secondary | ICD-10-CM | POA: Diagnosis present

## 2022-08-08 DIAGNOSIS — L97519 Non-pressure chronic ulcer of other part of right foot with unspecified severity: Secondary | ICD-10-CM | POA: Diagnosis present

## 2022-08-08 DIAGNOSIS — Z79899 Other long term (current) drug therapy: Secondary | ICD-10-CM | POA: Diagnosis not present

## 2022-08-08 DIAGNOSIS — K219 Gastro-esophageal reflux disease without esophagitis: Secondary | ICD-10-CM | POA: Diagnosis present

## 2022-08-08 DIAGNOSIS — G629 Polyneuropathy, unspecified: Secondary | ICD-10-CM | POA: Diagnosis present

## 2022-08-08 LAB — CBG MONITORING, ED: Glucose-Capillary: 200 mg/dL — ABNORMAL HIGH (ref 70–99)

## 2022-08-08 LAB — CBC
HCT: 29.1 % — ABNORMAL LOW (ref 39.0–52.0)
Hemoglobin: 8.8 g/dL — ABNORMAL LOW (ref 13.0–17.0)
MCH: 28.7 pg (ref 26.0–34.0)
MCHC: 30.2 g/dL (ref 30.0–36.0)
MCV: 94.8 fL (ref 80.0–100.0)
Platelets: 160 10*3/uL (ref 150–400)
RBC: 3.07 MIL/uL — ABNORMAL LOW (ref 4.22–5.81)
RDW: 19.1 % — ABNORMAL HIGH (ref 11.5–15.5)
WBC: 5.4 10*3/uL (ref 4.0–10.5)
nRBC: 0 % (ref 0.0–0.2)

## 2022-08-08 LAB — BASIC METABOLIC PANEL
Anion gap: 7 (ref 5–15)
BUN: 23 mg/dL (ref 8–23)
CO2: 33 mmol/L — ABNORMAL HIGH (ref 22–32)
Calcium: 8 mg/dL — ABNORMAL LOW (ref 8.9–10.3)
Chloride: 100 mmol/L (ref 98–111)
Creatinine, Ser: 0.91 mg/dL (ref 0.61–1.24)
GFR, Estimated: >60 mL/min (ref >60)
Glucose, Bld: 102 mg/dL — ABNORMAL HIGH (ref 70–99)
Potassium: 3.7 mmol/L (ref 3.5–5.1)
Sodium: 140 mmol/L (ref 135–145)

## 2022-08-08 LAB — GLUCOSE, CAPILLARY: Glucose-Capillary: 115 mg/dL — ABNORMAL HIGH (ref 70–99)

## 2022-08-08 MED ORDER — SERTRALINE HCL 50 MG PO TABS
150.0000 mg | ORAL_TABLET | Freq: Every day | ORAL | Status: DC
  Administered 2022-08-08 – 2022-08-10 (×3): 150 mg via ORAL
  Filled 2022-08-08 (×3): qty 3

## 2022-08-08 NOTE — Assessment & Plan Note (Signed)
Uses CPAP at home. Declines CPAP here, not ordered.

## 2022-08-08 NOTE — ED Notes (Signed)
Patient disconnected himself from monitor and ambulated to the restroom. This nurse in room at this time to reconnect patient to monitor.

## 2022-08-08 NOTE — Assessment & Plan Note (Deleted)
This is metastatic disease from malignant neuroendocrine tumor. Continue outpatient oncology follow up and recommendations.

## 2022-08-08 NOTE — Assessment & Plan Note (Signed)
Resume home Zoloft

## 2022-08-08 NOTE — Progress Notes (Signed)
Pt from [facility] [phone].  Updates to facility for discharge.    Contact son Lorenda Hatchet [phone] for discharge.

## 2022-08-08 NOTE — Assessment & Plan Note (Deleted)
Valvular heart disease Pleural effusions EF appears recovered, based on Echo from 04/11/2022, EF 60-65%, normal diastolic parameters, moderate MR, moderate RE, mild-mod AR.  --Continue IV diuresis --Strict I/O's, daily weights --O2 per protocol if needed --Monitor renal function, electrolytes --No need for repeat Echo at this time

## 2022-08-08 NOTE — Assessment & Plan Note (Deleted)
Stable.  Monitor.  

## 2022-08-08 NOTE — Assessment & Plan Note (Addendum)
Seen by wound care nurse here.  Will refer to wound care center as outpatient.  Will give 1 week of empiric doxycycline just in case infection.  Dressing change daily and as needed.  Cleanse with normal saline and pat dry.  Cover with calcium alginate dressing [place] [phone]), top with ABD pad and secure with Kerlix wrap applied just below the toes to just below the knees and secure with paper tape.  Damp with Ace bandage 6 inch and apply in similar manner.  Change daily and as needed drainage.

## 2022-08-08 NOTE — Assessment & Plan Note (Deleted)
Appears not on PPI or H2 blocker. Monitor

## 2022-08-08 NOTE — Progress Notes (Signed)
Progress Note   Patient: Louis Sanchez HDQ:[id number] DOB: 1941-05-16 DOA: 08/07/2022     0 DOS: the patient was seen and examined on 08/08/2022   Brief hospital course: Louis Sanchez is an 82 year old male with history of neuroendocrine tumor with metastasis to liver, lung cancer status post right lower lobectomy, neuropathy, liver cirrhosis, atrial fibrillation on Xarelto, hypothyroid, who presented to the ED on 08/07/2022 for evaluation of shortness of breath and swelling of his lower extremities.    He was admitted to the hospital and started on IV diuresis for decompensated CHF.  Assessment and Plan: * Shortness of breath Likely due to CHF decompensation and pleural effusions. Continue mgmt as outlined for CHF.  Acute on chronic systolic CHF (congestive heart failure) (HCC) Valvular heart disease Pleural effusions EF appears recovered, based on Echo from 04/11/2022, EF 60-65%, normal diastolic parameters, moderate MR, moderate RE, mild-mod AR.  --Continue IV diuresis --Strict I/O's, daily weights --O2 per protocol if needed --Monitor renal function, electrolytes --No need for repeat Echo at this time  Essential hypertension - Spironolactone 50 mg daily resumed - Hydralazine IV PRN -- On IV diuresis as well  GERD without esophagitis Appears not on PPI or H2 blocker. Monitor  Hypokalemia K on admission 3.1, replaced. K this AM 3.7 Monitor BMP and Mg, replace K and Mg PRN  Permanent atrial fibrillation (HCC) Continue Xarelto. Not on rate control agent per med history. Telemetry.  Ulcer of both feet (HCC) - Wound care consulted - Image taken and uploaded to epic media  Obstructive sleep apnea syndrome .  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 Monitor.  Anxiety, mild Resume home Zoloft  Venous stasis dermatitis Monitor. Wound care per orders.  Liver masses This is metastatic disease from malignant neuroendocrine tumor. Continue outpatient oncology follow up and recommendations.        Subjective: Louis Sanchez seen in ED holding for a bed this AM, eating breakfast. He reports feeling modest improvement in his breathing so far.  No other acute complaints.   Physical Exam: Vitals:   08/08/22 0830 08/08/22 0900 08/08/22 1014 08/08/22 1105  BP: 110/72 117/83  106/63  Pulse:  92  88  Resp: (!) 24 18  18   Temp:   97.7 F (36.5 C)   TempSrc:      SpO2:      Weight:      Height:       General exam: awake, alert, no acute distress HEENT: atraumatic, clear conjunctiva, anicteric sclera, moist mucus membranes, hearing grossly normal  Respiratory system: CTAB with diminished bases, no wheezes, rales or rhonchi, normal respiratory effort. Cardiovascular system: normal S1/S2, RRR, systolic murmur, no pedal edema.   Gastrointestinal system: soft, NT, ND, no HSM felt, +bowel sounds. Central nervous system: A&O x4. no gross focal neurologic deficits, normal speech Extremities: venous stasis of b/l LE's, no edema but skin wrinkling of legs consistent with resolution of edema, normal tone Skin: dry, intact, normal temperature, lower extremity venous stasis Psychiatry: normal mood, congruent affect, judgement and insight appear normal   Data Reviewed:  Notable labs ---   glucose 102, bicarb 33, Ca 8.0, Hbg 8.8 from 9.7. No leukocytosis.    Family Communication: None present, will attempt to call  Disposition: Status is: Observation The  patient remains OBS appropriate and will d/c before 2 midnights. Requires further diuresis and clinical improvement   Planned Discharge Destination: Home    Time spent: 40 minutes  Author: Pennie Banter, DO 08/08/2022 12:21 PM  For on call  review [email or web address].

## 2022-08-08 NOTE — Consult Note (Signed)
   Heart Failure Nurse Navigator Note  HFpEF 60 to 65%.  Mild biatrial enlargement.  Moderate mitral regurgitation.  Moderate tricuspid regurgitation.  Mild to moderate aortic insufficiency.   He presented to the emergency room from assisted living facility with shortness of breath and worsening lower extremity edema.  BNP 651.  Comorbidities:  Hypertension GERD Liver mass Anxiety Obstructive sleep apnea Permanent atrial fibrillation  Medications:  Furosemide 40 mg IV 2 times a day Levothyroxine 175 mcg daily Xarelto 20 mg daily with supper Crestor 10 mg Spironolactone 50 mg daily  Labs:  Sodium 140, potassium 3.7, chloride 100, CO2 33, BUN 23, creatinine 0.91 There are no weights documented nor intake or output.  Initial meeting with patient in the emergency room.  He is lying in bed quietly with no acute distress watching TV.  There are no family members at the bedside.  He states that he lives in an assisted living with his wife.  States that he weighs himself daily and he weighs anywhere from 150 to 158 pounds he said it always fluctuates.  Discussed reporting 2 pound weight gain overnight or 5 pounds in the week.  Also to report changes in symptoms such as increasing shortness of breath, shortness of breath on exertion, PND orthopnea or swelling.  States that he does not use salt at the table  Discussed his fluid restriction he drinks five 8 ounce cups of coffee daily and 112 ounce cans of Pepsi.  He states that he feels like his memory is getting worse.  Made aware he has follow-up in the outpatient heart failure clinic on January 22 at 1 PM.  He has a 6% no-show which is 3 out of 48 appointments.  Tresa Endo RN [facility]

## 2022-08-08 NOTE — ED Notes (Signed)
This RN did assist pt to bathroom.

## 2022-08-08 NOTE — Discharge Instructions (Addendum)

## 2022-08-09 DIAGNOSIS — R0602 Shortness of breath: Secondary | ICD-10-CM | POA: Diagnosis not present

## 2022-08-09 LAB — CBC
HCT: 28.7 % — ABNORMAL LOW (ref 39.0–52.0)
Hemoglobin: 8.9 g/dL — ABNORMAL LOW (ref 13.0–17.0)
MCH: 28.8 pg (ref 26.0–34.0)
MCHC: 31 g/dL (ref 30.0–36.0)
MCV: 92.9 fL (ref 80.0–100.0)
Platelets: 166 10*3/uL (ref 150–400)
RBC: 3.09 MIL/uL — ABNORMAL LOW (ref 4.22–5.81)
RDW: 18.6 % — ABNORMAL HIGH (ref 11.5–15.5)
WBC: 5.1 10*3/uL (ref 4.0–10.5)
nRBC: 0 % (ref 0.0–0.2)

## 2022-08-09 LAB — BASIC METABOLIC PANEL
Anion gap: 9 (ref 5–15)
BUN: 25 mg/dL — ABNORMAL HIGH (ref 8–23)
CO2: 31 mmol/L (ref 22–32)
Calcium: 8.3 mg/dL — ABNORMAL LOW (ref 8.9–10.3)
Chloride: 96 mmol/L — ABNORMAL LOW (ref 98–111)
Creatinine, Ser: 0.98 mg/dL (ref 0.61–1.24)
GFR, Estimated: >60 mL/min (ref >60)
Glucose, Bld: 105 mg/dL — ABNORMAL HIGH (ref 70–99)
Potassium: 3.5 mmol/L (ref 3.5–5.1)
Sodium: 136 mmol/L (ref 135–145)

## 2022-08-09 LAB — MAGNESIUM: Magnesium: 2.2 mg/dL (ref 1.7–2.4)

## 2022-08-09 MED ORDER — POTASSIUM CHLORIDE CRYS ER 20 MEQ PO TBCR
40.0000 meq | EXTENDED_RELEASE_TABLET | Freq: Once | ORAL | Status: AC
  Administered 2022-08-09: 40 meq via ORAL
  Filled 2022-08-09: qty 2

## 2022-08-09 NOTE — Progress Notes (Signed)
Progress Note   Patient: Louis Sanchez OVA:[id number] DOB: Jul 16, 1941 DOA: 08/07/2022     1 DOS: the patient was seen and examined on 08/09/2022   Brief hospital course: Mr. Marlyn Rabine is an 82 year old male with history of neuroendocrine tumor with metastasis to liver, lung cancer status post right lower lobectomy, neuropathy, liver cirrhosis, atrial fibrillation on Xarelto, hypothyroid, who presented to the ED on 08/07/2022 for evaluation of shortness of breath and swelling of his lower extremities.    He was admitted to the hospital and started on IV diuresis for decompensated CHF.  Assessment and Plan: * Shortness of breath Likely due to CHF decompensation and pleural effusions. Continue mgmt as outlined for CHF.  Acute on chronic systolic CHF (congestive heart failure) (HCC) Valvular heart disease Pleural effusions EF appears recovered, based on Echo from 04/11/2022, EF 60-65%, normal diastolic parameters, moderate MR, moderate RE, mild-mod AR.  --Continue IV diuresis --Strict I/O's, daily weights --O2 per protocol if needed --Monitor renal function, electrolytes --No need for repeat Echo at this time  Essential hypertension - Spironolactone 50 mg daily resumed - Hydralazine IV PRN -- On IV diuresis as well  GERD without esophagitis Appears not on PPI or H2 blocker. Monitor  Hypokalemia K on admission 3.1, replaced. K this AM 3.7 Monitor BMP and Mg, replace K and Mg PRN  Permanent atrial fibrillation (HCC) Continue Xarelto. Not on rate control agent per med history. Telemetry.  Ulcer of both feet (HCC) - Wound care consulted - Image taken and uploaded to epic media  Obstructive sleep apnea syndrome Uses CPAP at home. Declines CPAP here, not ordered.  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 Monitor.  Anxiety, mild Resume home Zoloft  Venous stasis dermatitis Monitor. Wound care per orders.  Liver masses This is metastatic disease from malignant neuroendocrine tumor. Continue outpatient oncology follow up and recommendations.        Subjective: Pt awake sitting up in bed, eating breakfast this AM.  Reports he feels about the same, still feels short of breath.  He notes that usually his breathing improves once he gets up moving around.  No other acute complaints  Ambulated with nursing and noted O2 desaturation to low 80's briefly.    Physical Exam: Vitals:   08/09/22 0453 08/09/22 0833 08/09/22 0918 08/09/22 1126  BP: 107/65 99/61  120/84  Pulse: 88 92  95  Resp: 20 18  20   Temp: 98.2 F (36.8 C) 98.5 F (36.9 C)  97.7 F (36.5 C)  TempSrc: Axillary Oral  Oral  SpO2: 90% 95%  98%  Weight:   67.4 kg   Height:       General exam: awake, alert, no acute distress HEENT: atraumatic, clear conjunctiva, anicteric sclera, moist mucus membranes, hearing grossly normal  Respiratory system: CTAB with diminished bases, no wheezes, rales or rhonchi, normal respiratory effort. Cardiovascular system: normal S1/S2, RRR, systolic murmur, no pedal edema.   Gastrointestinal system: soft, NT, ND, no HSM felt, +bowel sounds. Central nervous system: A&O x4. no gross focal neurologic deficits, normal speech Extremities: venous stasis of b/l LE's, no edema but skin wrinkling of legs consistent with resolution of edema, normal tone Skin: dry, intact, normal temperature, lower extremity venous stasis Psychiatry: normal mood, congruent affect, judgement and insight  appear normal   Data Reviewed:  Notable labs --- Cl 96, glucose 105, Cr stable 0.98, Hbg stable 8.9    Family Communication: None present, will attempt to call  Disposition: Status is: Observation The  patient remains OBS appropriate and will d/c before 2 midnights. Requires further diuresis and clinical improvement   Planned Discharge Destination: Home    Time spent: 40 minutes  Author: Pennie Banter, DO 08/09/2022 2:50 PM  For on call review [email or web address].

## 2022-08-09 NOTE — TOC Initial Note (Signed)
Transition of Care [facility]) - Initial/Assessment Note    Patient Details  Name: Louis Sanchez MRN: [id number] Date of Birth: 28-Apr-1941  Transition of Care [facility]) CM/SW Contact:    Truddie Hidden, RN Phone Number: 08/09/2022, 2:48 PM  Clinical Narrative:                 [facility] admits from [facility].   Spoke with Asher Muir in admissions regarding return to facility.  Requesting FL2, HH RN and discharge summary to be faxed to [phone].  Referral sent and accepted to [place] from [facility]           Patient Goals and CMS Choice            Expected Discharge Plan and Services                                              Prior Living Arrangements/Services                       Activities of Daily Living Home Assistive Devices/Equipment: Eyeglasses ADL Screening (condition at time of admission) Patient's cognitive ability adequate to safely complete daily activities?: Yes Is the patient deaf or have difficulty hearing?: No Does the patient have difficulty seeing, even when wearing glasses/contacts?: No Does the patient have difficulty concentrating, remembering, or making decisions?: No Patient able to express need for assistance with ADLs?: Yes Does the patient have difficulty dressing or bathing?: No Independently performs ADLs?: No Communication: Independent Dressing (OT): Independent Grooming: Independent Feeding: Independent Bathing: Needs assistance Is this a change from baseline?: Change from baseline, expected to last <3 days Toileting: Needs assistance Is this a change from baseline?: Change from baseline, expected to last <3 days In/Out Bed: Needs assistance Is this a change from baseline?: Change from baseline, expected to last <3 days Walks in Home: Needs assistance Is this a change from baseline?: Change from baseline, expected to last <3 days Does the patient have difficulty walking or climbing stairs?: No Weakness of Legs:  None Weakness of Arms/Hands: None  Permission Sought/Granted                  Emotional Assessment              Admission diagnosis:  Shortness of breath [R06.02] Acute on chronic systolic CHF (congestive heart failure) (HCC) [I50.23] Acute on chronic congestive heart failure, unspecified heart failure type [facility]) [I50.9] Patient Active Problem List   Diagnosis Date Noted   Shortness of breath 08/07/2022   Permanent atrial fibrillation (HCC) 08/07/2022   Hypokalemia 08/07/2022   Heart failure (HCC) 04/11/2022   Iron deficiency anemia 04/11/2022   Acute on chronic systolic CHF (congestive heart failure) (HCC) 04/10/2022   Symptomatic anemia 02/18/2022   Sepsis due to cellulitis (HCC) 02/18/2022   Essential hypertension 02/18/2022   Hypothyroidism 02/18/2022   GERD without esophagitis 02/18/2022   Dyslipidemia 02/18/2022   CAP (community acquired pneumonia) 02/18/2022   Ulcer of both feet (HCC) 01/12/2021   Right ventricular dysfunction 10/27/2020   Acquired thrombophilia (HCC) 10/07/2020   Chronic systolic CHF (congestive heart failure) (HCC) 10/07/2020   Hydropneumothorax 04/08/2020   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/18/2019   Fusion of spine of cervical region 11/18/2019   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/18/2019   Neck pain  11/18/2019   Personal history of skin cancer 05/21/2019   Chronic anticoagulation 12/11/2016   Former tobacco use 12/11/2016   Malignant neoplasm of lower lobe of right lung (HCC) 11/28/2016   Alcoholic cirrhosis of liver without ascites (HCC) 10/07/2016   Aortic atherosclerosis (HCC) 10/07/2016   Hypothyroidism (acquired) 10/07/2016   Obstructive sleep apnea syndrome 10/07/2016   Thrombocytopenia (HCC) 10/07/2016   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   Mitral regurgitation 09/20/2016   TIA (transient ischemic attack) 09/20/2016   Liver masses 09/13/2016   Acute  bronchitis 09/13/2016   Leukocytosis 09/13/2016   Venous stasis dermatitis 09/13/2016   LFT elevation    Alcoholic gastritis    Atrial fibrillation with RVR (HCC) 09/11/2016   Acute lower GI bleeding 05/23/2016   Anxiety, mild 05/23/2016   Hematochezia 05/23/2016   History of hypothyroidism 05/23/2016   Chronic atrial fibrillation (HCC) 05/03/2016   Ruptured varicose vein 01/25/2016   Varicose veins of both lower extremities with complications 01/25/2016   Mitral valve insufficiency 08/25/2014   Temporary cerebral vascular dysfunction 08/25/2014   Inguinal hernia 02/18/2013   Actinic keratosis 01/21/2010   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 [place] [address] Phone: [phone] Fax: [phone]     Social Determinants of Health (SDOH) Social History: SDOH Screenings   Food Insecurity: No Food Insecurity (08/08/2022)  Housing: Low Risk  (08/08/2022)  Transportation Needs: No Transportation Needs (08/08/2022)  Utilities: Not At Risk (08/08/2022)  Tobacco Use: Medium Risk (08/08/2022)   SDOH Interventions:     Readmission Risk Interventions     No data to display

## 2022-08-10 DIAGNOSIS — E876 Hypokalemia: Secondary | ICD-10-CM

## 2022-08-10 DIAGNOSIS — E039 Hypothyroidism, unspecified: Secondary | ICD-10-CM

## 2022-08-10 DIAGNOSIS — I872 Venous insufficiency (chronic) (peripheral): Secondary | ICD-10-CM | POA: Diagnosis not present

## 2022-08-10 DIAGNOSIS — F419 Anxiety disorder, unspecified: Secondary | ICD-10-CM

## 2022-08-10 DIAGNOSIS — C7B8 Other secondary neuroendocrine tumors: Secondary | ICD-10-CM

## 2022-08-10 DIAGNOSIS — I5033 Acute on chronic diastolic (congestive) heart failure: Secondary | ICD-10-CM | POA: Diagnosis not present

## 2022-08-10 DIAGNOSIS — L97522 Non-pressure chronic ulcer of other part of left foot with fat layer exposed: Secondary | ICD-10-CM

## 2022-08-10 DIAGNOSIS — L97512 Non-pressure chronic ulcer of other part of right foot with fat layer exposed: Secondary | ICD-10-CM

## 2022-08-10 DIAGNOSIS — I509 Heart failure, unspecified: Secondary | ICD-10-CM | POA: Diagnosis not present

## 2022-08-10 DIAGNOSIS — I10 Essential (primary) hypertension: Secondary | ICD-10-CM

## 2022-08-10 LAB — BASIC METABOLIC PANEL
Anion gap: 8 (ref 5–15)
BUN: 30 mg/dL — ABNORMAL HIGH (ref 8–23)
CO2: 29 mmol/L (ref 22–32)
Calcium: 8.4 mg/dL — ABNORMAL LOW (ref 8.9–10.3)
Chloride: 99 mmol/L (ref 98–111)
Creatinine, Ser: 0.89 mg/dL (ref 0.61–1.24)
GFR, Estimated: >60 mL/min (ref >60)
Glucose, Bld: 120 mg/dL — ABNORMAL HIGH (ref 70–99)
Potassium: 4.4 mmol/L (ref 3.5–5.1)
Sodium: 136 mmol/L (ref 135–145)

## 2022-08-10 MED ORDER — SPIRONOLACTONE 25 MG PO TABS: 25.0000 mg | ORAL_TABLET | Freq: Every day | ORAL | 0 refills | Status: AC

## 2022-08-10 MED ORDER — METOPROLOL SUCCINATE ER 25 MG PO TB24: 12.5000 mg | ORAL_TABLET | Freq: Every day | ORAL | 0 refills | Status: AC

## 2022-08-10 MED ORDER — GABAPENTIN 300 MG PO CAPS: 300.0000 mg | ORAL_CAPSULE | Freq: Three times a day (TID) | ORAL | 0 refills | Status: AC

## 2022-08-10 MED ORDER — FUROSEMIDE 40 MG PO TABS: 40.0000 mg | ORAL_TABLET | Freq: Two times a day (BID) | ORAL | 0 refills | Status: DC

## 2022-08-10 MED ORDER — FUROSEMIDE 40 MG PO TABS
40.0000 mg | ORAL_TABLET | Freq: Two times a day (BID) | ORAL | Status: DC
  Administered 2022-08-10: 40 mg via ORAL
  Filled 2022-08-10: qty 1

## 2022-08-10 MED ORDER — DOXYCYCLINE HYCLATE 100 MG PO TABS: 100.0000 mg | ORAL_TABLET | Freq: Two times a day (BID) | ORAL | 0 refills | Status: DC

## 2022-08-10 MED ORDER — DOXYCYCLINE HYCLATE 100 MG PO TABS: 100.0000 mg | ORAL_TABLET | Freq: Two times a day (BID) | ORAL | Status: DC

## 2022-08-10 NOTE — Progress Notes (Signed)
Notified [facility] pt ready for discharge.  They will pick him up.  Per their request, AVS faxed to [facility].    Notified stepson Lorenda Hatchet that pt is discharging and [facility] is picking up.    [facility] called requesting FL2.  FL2 faxed to [facility] by charge nurse Amy.

## 2022-08-10 NOTE — Progress Notes (Signed)
SATURATION QUALIFICATIONS: (This note is used to comply with regulatory documentation for home oxygen)   Patient Saturations on Room Air at Rest = 93%  Patient Saturations on Room Air while Ambulating = 91%   Please briefly explain why patient needs home oxygen:

## 2022-08-10 NOTE — [place] FL2 (Addendum)
[place] MEDICAID [id number] LEVEL OF CARE FORM     IDENTIFICATION  Patient Name: Louis Sanchez Birthdate: July 08, 1941 Sex: male Admission Date (Current Location): 08/07/2022  [facility] and [place] Number:  Chiropodist and Address:  [facility], [address], [place], [place] [address]      Provider Number: [id number]  Attending Physician Name and Address:  Alford Highland, MD  Relative Name and Phone Number:  Molly Maduro Thompson,[phone]    Current Level of Care: Hospital Recommended Level of Care: Assisted Living Facility Prior Approval Number:    Date Approved/Denied:   PASRR Number:    Discharge Plan: Other (Comment) (ALF)    Current Diagnoses: Patient Active Problem List   Diagnosis Date Noted   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 heart failure) (HCC) 08/10/2022   Permanent atrial fibrillation (HCC) 08/07/2022   Hypokalemia 08/07/2022   Pleural effusion due to CHF (congestive heart failure) (HCC) 04/11/2022   Iron deficiency anemia 04/11/2022   Symptomatic anemia 02/18/2022   Sepsis due to cellulitis (HCC) 02/18/2022   Essential hypertension 02/18/2022   Hypothyroidism 02/18/2022   Dyslipidemia 02/18/2022   CAP (community acquired pneumonia) 02/18/2022   Ulcer of both feet (HCC) 01/12/2021   Right ventricular dysfunction 10/27/2020   Acquired thrombophilia (HCC) 10/07/2020   Chronic systolic CHF (congestive heart failure) (HCC) 10/07/2020   Hydropneumothorax 04/08/2020   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/18/2019   Fusion of spine of cervical region 11/18/2019   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/18/2019   Neck pain 11/18/2019   Personal history of skin cancer 05/21/2019   Chronic anticoagulation 12/11/2016   Former tobacco use 12/11/2016   Malignant neoplasm of lower lobe of right lung (HCC) 11/28/2016   Aortic atherosclerosis (HCC) 10/07/2016   Hypothyroidism (acquired) 10/07/2016    Obstructive sleep apnea syndrome 10/07/2016   Thrombocytopenia (HCC) 10/07/2016   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   Mitral regurgitation 09/20/2016   TIA (transient ischemic attack) 09/20/2016   Acute bronchitis 09/13/2016   Leukocytosis 09/13/2016   Venous stasis dermatitis 09/13/2016   LFT elevation    Alcoholic gastritis    Atrial fibrillation with RVR (HCC) 09/11/2016   Acute lower GI bleeding 05/23/2016   Anxiety, mild 05/23/2016   Hematochezia 05/23/2016   History of hypothyroidism 05/23/2016   Chronic atrial fibrillation (HCC) 05/03/2016   Ruptured varicose vein 01/25/2016   Varicose veins of both lower extremities with complications 01/25/2016   Mitral valve insufficiency 08/25/2014   Temporary cerebral vascular dysfunction 08/25/2014   Inguinal hernia 02/18/2013   Actinic keratosis 01/21/2010   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   Benign neoplasm of colon 04/04/2008    Orientation RESPIRATION BLADDER Height & Weight        Normal Continent Weight: 67.4 kg Height:  5\' 6"  (167.6 cm)  BEHAVIORAL SYMPTOMS/MOOD NEUROLOGICAL BOWEL NUTRITION STATUS   (n/a)  (n/a) Incontinent Diet  AMBULATORY STATUS COMMUNICATION OF NEEDS Skin   Limited Assist Verbally Other (Comment) (Bilateral weeping legs, Ulcer R anterior foot wrap with alginate, abd pads and compression wrap)                       Personal Care Assistance Level of Assistance  Bathing, Dressing Bathing Assistance: Limited assistance   Dressing Assistance: Limited assistance     Functional Limitations Info             SPECIAL CARE FACTORS FREQUENCY  Contractures Contractures Info: Not present    Additional Factors Info  Code Status, Allergies Code Status Info: FULL Allergies Info: Atorvastatin, Levofloxacin           Current Medications (08/10/2022):  This is the current hospital active medication list TAKE these medications      doxycycline 100 MG tablet Commonly known as: VIBRA-TABS Take 1 tablet (100 mg total) by mouth every 12 (twelve) hours.    furosemide 40 MG tablet Commonly known as: LASIX Take 1 tablet (40 mg total) by mouth 2 (two) times daily. What changed:  medication strength how much to take when to take this reasons to take this additional instructions    gabapentin 300 MG capsule Commonly known as: NEURONTIN Take 1 capsule (300 mg total) by mouth 3 (three) times daily. What changed:  when to take this Another medication with the same name was removed. Continue taking this medication, and follow the directions you see here.    IRON PO Take 325 mg by mouth daily.    levothyroxine 175 MCG tablet Commonly known as: SYNTHROID Take 175 mcg by mouth daily before breakfast.    metoprolol succinate 25 MG 24 hr tablet Commonly known as: Toprol XL Take 0.5 tablets (12.5 mg total) by mouth at bedtime.    multivitamin tablet Take 1 tablet by mouth daily.    nitroGLYCERIN 0.4 MG SL tablet Commonly known as: NITROSTAT Place under the tongue. What changed: Another medication with the same name was removed. Continue taking this medication, and follow the directions you see here.    rivaroxaban 20 MG Tabs tablet Commonly known as: XARELTO Take 20 mg by mouth daily with supper.    rosuvastatin 10 MG tablet Commonly known as: CRESTOR Take 10 mg by mouth daily.    sertraline 100 MG tablet Commonly known as: ZOLOFT Take 1.5 tablets by mouth daily.    spironolactone 25 MG tablet Commonly known as: ALDACTONE Take 1 tablet (25 mg total) by mouth daily. Start taking on: August 11, 2022 What changed:  medication strength how much to take    tiotropium 18 MCG inhalation capsule Commonly known as: SPIRIVA Place 18 mcg into inhaler and inhale daily.     Discharge Medications: Please see discharge summary for a list of discharge medications.  Relevant Imaging Results:  Relevant Lab  Results:   Additional Information    Truddie Hidden, RN

## 2022-08-10 NOTE — Discharge Summary (Signed)
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 [facility] Course: 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    He was admitted to the hospital and started on IV diuresis for decompensated CHF.  Patient was diuresed down to a weight of 148.28.  Recommend doing daily weights.  Will continue Lasix 40 mg p.o. twice daily.  Patient was put back on spironolactone.  Will add a low low-dose Toprol XL.  The patient was not orthostatic upon discharge.  Pulse ox 91% with ambulation.  Patient was placed on empiric antibiotics just in case infection.  Will give 1 week of doxycycline.  Recommend following up with the wound care center.  Home health set up for daily dressing changes.  Assessment and Plan: * Acute on chronic diastolic  CHF (congestive heart failure) ([place]) The patient was started on IV diuresis and switched over to oral Lasix 40 mg twice a day.  I recommend staying on 40 mg twice a day and hopefully can cut down to 20 mg twice a day as outpatient.  Low-dose spironolactone and low-dose Toprol also started.  Venous stasis dermatitis Seen by wound care nurse here.  Will refer to wound care center as outpatient.  Will give 1 week of empiric doxycycline just in case infection.  Dressing change daily and as needed.  Cleanse with normal saline and pat dry.  Cover with calcium alginate dressing Kellie Simmering [phone]), top with ABD pad and secure with Kerlix wrap applied just below the toes to just below the knees and secure with paper tape.  Damp with Ace bandage 6 inch and apply in similar manner.  Change daily and as needed drainage.  Essential hypertension The patient was not orthostatic on the day of discharge.  Will decrease spironolactone dose down to 25 mg daily since he was not taking the 50 mg daily as outpatient.  Added low-dose Toprol XL.  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  Recommend checking BMP.  Permanent atrial fibrillation (HCC) Continue Xarelto for anticoagulation will use low-dose Toprol-XL at night for rate control.  Pleural effusion due to CHF (congestive heart failure) (HCC) Right-sided pleural effusion likely secondary to heart failure.  Continue to monitor as outpatient.  Patient not  in any respiratory distress.  Patient also does have neuroendocrine tumor that is metastasized to the liver and could also cause a pleural effusion.  Ulcer of both feet (HCC) Lower for to wound care center as outpatient.  Empiric doxycycline for 1 week.  Dressing changes daily and as needed.  Obstructive sleep apnea syndrome Uses CPAP at  home. Declines CPAP here, not ordered.  Malignant neoplasm of lower lobe of right lung (HCC) Continue outpatient follow-up with oncology   Hypothyroidism (acquired) Levothyroxine   Anxiety, mild Resume home Zoloft         Consultants: Wound care nurse Procedures performed: None Disposition: Assisted living with home health nurse noted Diet recommendation:  Cardiac diet DISCHARGE MEDICATION: Allergies as of 08/10/2022       Reactions   Atorvastatin    Other reaction(s): Muscle Pain Other reaction(s): Muscle Pain Other reaction(s): Muscle Pain Other reaction(s): Muscle Pain Other reaction(s): Muscle Pain   Levofloxacin Rash        Medication List     STOP taking these medications    acetaminophen 650 MG CR tablet Commonly known as: TYLENOL   torsemide 20 MG tablet Commonly known as: DEMADEX   trimethoprim-polymyxin b ophthalmic solution Commonly known as: POLYTRIM       TAKE these medications    doxycycline 100 MG tablet Commonly known as: VIBRA-TABS Take 1 tablet (100 mg total) by mouth every 12 (twelve) hours.   furosemide 40 MG tablet Commonly known as: LASIX Take 1 tablet (40 mg total) by mouth 2 (two) times daily. What changed:  medication strength how much to take when to take this reasons to take this additional instructions   gabapentin 300 MG capsule Commonly known as: NEURONTIN Take 1 capsule (300 mg total) by mouth 3 (three) times daily. What changed:  when to take this Another medication with the same name was removed. Continue taking this medication, and follow the directions you see here.   IRON PO Take 325 mg by mouth daily.   levothyroxine 175 MCG tablet Commonly known as: SYNTHROID Take 175 mcg by mouth daily before breakfast.   metoprolol succinate 25 MG 24 hr tablet Commonly known as: Toprol XL Take 0.5 tablets (12.5 mg total) by mouth at bedtime.   multivitamin tablet Take 1 tablet by mouth daily.    nitroGLYCERIN 0.4 MG SL tablet Commonly known as: NITROSTAT Place under the tongue. What changed: Another medication with the same name was removed. Continue taking this medication, and follow the directions you see here.   rivaroxaban 20 MG Tabs tablet Commonly known as: XARELTO Take 20 mg by mouth daily with supper.   rosuvastatin 10 MG tablet Commonly known as: CRESTOR Take 10 mg by mouth daily.   sertraline 100 MG tablet Commonly known as: ZOLOFT Take 1.5 tablets by mouth daily.   spironolactone 25 MG tablet Commonly known as: ALDACTONE Take 1 tablet (25 mg total) by mouth daily. Start taking on: August 11, 2022 What changed:  medication strength how much to take   tiotropium 18 MCG inhalation capsule Commonly known as: SPIRIVA Place 18 mcg into inhaler and inhale daily.        Follow-up Information     Curtis Sites III, MD Follow up in 1 week(s).   Specialty: Internal Medicine Contact information: [address] [place] [address] [phone]         [facility] Follow up in 1 week(s).  Specialty: Wound Care Contact information: [address] [id number] ar [phone]               Discharge Exam: Danley Danker Weights   08/07/22 1209 08/09/22 0918  Weight: 68.9 kg 67.4 kg   Physical Exam HENT:     Head: Normocephalic.     Mouth/Throat:     Pharynx: No oropharyngeal exudate.  Eyes:     General: Lids are normal.     Conjunctiva/sclera: Conjunctivae normal.  Cardiovascular:     Rate and Rhythm: Normal rate and regular rhythm.     Heart sounds: Normal heart sounds, S1 normal and S2 normal.  Pulmonary:     Breath sounds: No decreased breath sounds, wheezing, rhonchi or rales.  Abdominal:     Palpations: Abdomen is soft.     Tenderness: There is no abdominal tenderness.  Musculoskeletal:     Right lower leg: Swelling present.     Left lower leg: Swelling  present.  Skin:    General: Skin is warm.     Findings: No rash.  Neurological:     Mental Status: He is alert and oriented to person, place, and time.      Condition at discharge: stable  The results of significant diagnostics from this hospitalization (including imaging, microbiology, ancillary and laboratory) are listed below for reference.   Imaging Studies: DG Chest 2 View  Result Date: 08/07/2022 CLINICAL DATA:  CHF EXAM: CHEST - 2 VIEW COMPARISON:  April 10, 2022, October 02, 2021 FINDINGS: The cardiomediastinal silhouette is unchanged and enlarged in contour. Moderate RIGHT pleural effusion with a small loculated component, similar in comparison to prior but increased since March 2023. Decreased LEFT pleural effusion in comparison to prior. No pneumothorax. Persistent RIGHT basilar airspace opacity with reticular markings. Overall decrease of bilateral interstitial markings in comparison to prior. Visualized abdomen is unremarkable. Status post ACDF. IMPRESSION: 1. Moderate RIGHT pleural effusion with a small loculated component, similar in comparison to prior but increased since March 2023. 2. Decreased LEFT pleural effusion in comparison to prior. 3. Improved pulmonary edema comparison to prior from September. There may be a degree of residual mild underlying pulmonary edema. Electronically Signed   By: Valentino Saxon M.D.   On: 08/07/2022 13:01    Microbiology: Results for orders placed or performed during the hospital encounter of 08/07/22  Resp panel by RT-PCR (RSV, Flu A&B, Covid) Anterior Nasal Swab     Status: None   Collection Time: 08/07/22  7:00 PM   Specimen: Anterior Nasal Swab  Result Value Ref Range Status   SARS Coronavirus 2 by RT PCR NEGATIVE NEGATIVE Final    Comment: (NOTE) SARS-CoV-2 target nucleic acids are NOT DETECTED.  The SARS-CoV-2 RNA is generally detectable in upper respiratory specimens during the acute phase of infection. The  lowest concentration of SARS-CoV-2 viral copies this assay can detect is 138 copies/mL. A negative result does not preclude SARS-Cov-2 infection and should not be used as the sole basis for treatment or other patient management decisions. A negative result may occur with  improper specimen collection/handling, submission of specimen other than nasopharyngeal swab, presence of viral mutation(s) within the areas targeted by this assay, and inadequate number of viral copies(<138 copies/mL). A negative result must be combined with clinical observations, patient history, and epidemiological information. The expected result is Negative.  Fact Sheet for Patients:  EntrepreneurPulse.com.au  Fact Sheet for Healthcare Providers:  IncredibleEmployment.be  This test is no t yet approved or cleared by  the [employer] and  has been authorized for detection and/or diagnosis of SARS-CoV-2 by FDA under an Emergency Use Authorization (EUA). This EUA will remain  in effect (meaning this test can be used) for the duration of the COVID-19 declaration under Section 564(b)(1) of the Act, 21 U.S.C.section 360bbb-3(b)(1), unless the authorization is terminated  or revoked sooner.       Influenza A by PCR NEGATIVE NEGATIVE Final   Influenza B by PCR NEGATIVE NEGATIVE Final    Comment: (NOTE) The Xpert Xpress SARS-CoV-2/FLU/RSV plus assay is intended as an aid in the diagnosis of influenza from Nasopharyngeal swab specimens and should not be used as a sole basis for treatment. Nasal washings and aspirates are unacceptable for Xpert Xpress SARS-CoV-2/FLU/RSV testing.  Fact Sheet for Patients: BloggerCourse.com  Fact Sheet for Healthcare Providers: SeriousBroker.it  This test is not yet approved or cleared by the [place] FDA and has been authorized for detection and/or diagnosis of SARS-CoV-2 by FDA under  an Emergency Use Authorization (EUA). This EUA will remain in effect (meaning this test can be used) for the duration of the COVID-19 declaration under Section 564(b)(1) of the Act, 21 U.S.C. section 360bbb-3(b)(1), unless the authorization is terminated or revoked.     Resp Syncytial Virus by PCR NEGATIVE NEGATIVE Final    Comment: (NOTE) Fact Sheet for Patients: BloggerCourse.com  Fact Sheet for Healthcare Providers: SeriousBroker.it  This test is not yet approved or cleared by the [place] FDA and has been authorized for detection and/or diagnosis of SARS-CoV-2 by FDA under an Emergency Use Authorization (EUA). This EUA will remain in effect (meaning this test can be used) for the duration of the COVID-19 declaration under Section 564(b)(1) of the Act, 21 U.S.C. section 360bbb-3(b)(1), unless the authorization is terminated or revoked.  Performed at [facility], [address] [place], [place] [address]     Labs: CBC: Recent Labs  Lab 08/07/22 1211 08/08/22 0414 08/09/22 0611  WBC 6.5 5.4 5.1  HGB 9.7* 8.8* 8.9*  HCT 32.2* 29.1* 28.7*  MCV 95.3 94.8 92.9  PLT 198 160 166   Basic Metabolic Panel: Recent Labs  Lab 08/07/22 1211 08/08/22 0414 08/09/22 0611 08/10/22 0405  NA 137 140 136 136  K 3.1* 3.7 3.5 4.4  CL 95* 100 96* 99  CO2 31 33* 31 29  GLUCOSE 92 102* 105* 120*  BUN 19 23 25* 30*  CREATININE 1.00 0.91 0.98 0.89  CALCIUM 8.4* 8.0* 8.3* 8.4*  MG 2.5*  --  2.2  --    Liver Function Tests: Recent Labs  Lab 08/07/22 1211  AST 25  ALT 12  ALKPHOS 93  BILITOT 0.8  PROT 8.1  ALBUMIN 3.3*   CBG: Recent Labs  Lab 08/08/22 1124 08/08/22 2008  GLUCAP 200* 115*    Discharge time spent: greater than 30 minutes.  Signed: Alford Highland, MD [facility] 08/10/2022

## 2022-08-10 NOTE — Assessment & Plan Note (Signed)
Right-sided pleural effusion likely secondary to heart failure.  Continue to monitor as outpatient.  Patient not in any respiratory distress.  Patient also does have neuroendocrine tumor that is metastasized to the liver and could also cause a pleural effusion.

## 2022-08-10 NOTE — TOC Transition Note (Signed)
Transition of Care [facility]) - CM/SW Discharge Note   Patient Details  Name: Louis Sanchez MRN: [id number] Date of Birth: [birth year]  [facility] of [facility]) CM/SW Contact:  Truddie Hidden, RN Phone Number: 08/10/2022, 2:27 PM   Clinical Narrative:    Attemt to reach [place] at [facility]. Message left regarding discharge today. RNCM request confirmation for staff to do dressing changes.  Spoke with patient's son. Patient's son will transport patient. He was advised per [facility] someone would have to be taught to do daily dressing change care. Patient son stated he works full time and his sister is on a cruise.   2:00pm  FL2, and discharge summary faxed to [phone]. Spoke with Adelina Mings. She advised there is a Engineer, civil (consulting) or a med tech available for dressing changes.   Spoke with patient's son. Patient already discharged to facility he was advised staff was available for dressing changes.   TOC signing off      Final next level of care: Assisted Living Barriers to Discharge: Barriers Resolved   Patient Goals and CMS Choice      Discharge Placement                         Discharge Plan and Services Additional resources added to the After Visit Summary for                            [facility] Arranged: RN [facility] Agency: [facility] Date [facility] Agency Contacted: 08/09/22 Time HH Agency Contacted: 1500 Representative spoke with at [facility] Agency: [place]  Social Determinants of Health (SDOH) Interventions SDOH Screenings   Food Insecurity: No Food Insecurity (08/08/2022)  Housing: Low Risk  (08/08/2022)  Transportation Needs: No Transportation Needs (08/08/2022)  Utilities: Not At Risk (08/08/2022)  Tobacco Use: Medium Risk (08/08/2022)     Readmission Risk Interventions     No data to display

## 2022-08-10 NOTE — Assessment & Plan Note (Signed)
The patient was started on IV diuresis and switched over to oral Lasix 40 mg twice a day.  I recommend staying on 40 mg twice a day and hopefully can cut down to 20 mg twice a day as outpatient.  Low-dose spironolactone and low-dose Toprol also started.

## 2022-08-10 NOTE — TOC Progression Note (Addendum)
Transition of Care [facility]) - Progression Note    Patient Details  Name: DEREN DEGRAZIA MRN: [id number] Date of Birth: Mar 25, 1941  Transition of Care [facility]) CM/SW Contact  Truddie Hidden, RN Phone Number: 08/10/2022, 12:27 PM  Clinical Narrative:    Left message for Asher Muir in admissions at [facility] regarding transportation and [facility] for dressings. Requested call back to this RNCM.   Attempt to reach patient's son. Leonard Downing, no answer. Left a message.          Expected Discharge Plan and Services         Expected Discharge Date: 08/10/22                                     Social Determinants of Health (SDOH) Interventions SDOH Screenings   Food Insecurity: No Food Insecurity (08/08/2022)  Housing: Low Risk  (08/08/2022)  Transportation Needs: No Transportation Needs (08/08/2022)  Utilities: Not At Risk (08/08/2022)  Tobacco Use: Medium Risk (08/08/2022)    Readmission Risk Interventions     No data to display

## 2022-08-14 NOTE — Progress Notes (Signed)
Patient ID: Louis Sanchez, male    DOB: [birth year], 82 y.o.   MRN: [id number]  HPI  Louis Sanchez is a 82 y/o male with a history of atrial fibrillation, hyperlipidemia, TIA, anxiety, GERD, HTN, thyroid disease, sleep apnea, metastatic neuroendocrine tumor, previous tobacco use and chronic heart failure.   Echo report from 04/11/22 reviewed and showed an EF of 60-65% along with mild LAE and moderate Louis/TR.   Admitted 08/07/22 due to SOB/ edema due to a/c heart failure. Diuresed. Admitted 04/10/22 due to chest pain, cough and fever. Due to hypotension, meds were held with parameters at home. Discharged after 2 days.   He presents today for a follow-up visit with a chief complaint of moderate SOB with minimal exertion. Describes this as chronic in nature although is periodically worse throughout the day. Has associated fatigue, pedal edema & light-headedness along with this. Denies any difficulty sleeping, abdominal distention, palpitations, chest pain, wheezing, cough or weight gain.   Unable to stand and get weighed today because he was very SOB upon arrival. Says that this happens throughout the day and he has to purse lip breathe to get it back under control. Does wear CPAP at night but says that he doesn't put it on if he naps during the day.   Both feet to above the ankles are wrapped in [employer] and he says that they get changed QOD.   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 [facility])    Other and unspecified hyperlipidemia 1997   Other specified disorder of male genital organs(608.89) 08/2012   Sleep disturbance,  unspecified    Thyroid disease    Unspecified hypothyroidism 2005   Past Surgical History:  Procedure Laterality Date   APPENDECTOMY  1953   colon polyps  2009   HERNIA REPAIR  1957   Right   INGUINAL HERNIA REPAIR Right 2014   SPINE SURGERY  1997   neck   WRIST SURGERY     Family History  Problem Relation Age of Onset   Alzheimer's disease Mother    Social History   Tobacco Use   Smoking status: Former    Years: 25.00    Types: Cigarettes   Smokeless tobacco: Never  Substance Use Topics   Alcohol use: Yes    Alcohol/week: 15.0 standard drinks of alcohol    Types: 15 Standard drinks or equivalent per week   Allergies  Allergen Reactions   Atorvastatin     Other reaction(s): Muscle Pain Other reaction(s): Muscle Pain Other reaction(s): Muscle Pain Other reaction(s): Muscle Pain Other reaction(s): Muscle Pain    Levofloxacin Rash   Prior to Admission medications   Medication Sig Start Date End Date Taking? Authorizing Provider  doxycycline (VIBRA-TABS) 100 MG tablet Take 1 tablet (100 mg total) by mouth every 12 (twelve) hours. 08/10/22  Yes Wieting, Richard, MD  Ferrous Sulfate (IRON PO) Take 325 mg by mouth daily.   Yes [provider]  furosemide (LASIX) 40 MG tablet Take 1 tablet (40 mg total) by  mouth 2 (two) times daily. 08/10/22  Yes Wieting, Richard, MD  gabapentin (NEURONTIN) 300 MG capsule Take 1 capsule (300 mg total) by mouth 3 (three) times daily. 08/10/22  Yes Alford Highland, MD  levothyroxine (SYNTHROID) 175 MCG tablet Take 175 mcg by mouth daily before breakfast. 12/30/20  Yes [provider]  metoprolol succinate (TOPROL XL) 25 MG 24 hr tablet Take 0.5 tablets (12.5 mg total) by mouth at bedtime. 08/10/22  Yes Wieting, Richard, MD  Multiple Vitamin (MULTIVITAMIN) tablet Take 1 tablet by mouth daily.   Yes [provider]  nitroGLYCERIN (NITROSTAT) 0.4 MG SL tablet Place under the tongue. 09/01/20  Yes [provider]   rivaroxaban (XARELTO) 20 MG TABS tablet Take 20 mg by mouth daily with supper.   Yes [provider]  rosuvastatin (CRESTOR) 10 MG tablet Take 10 mg by mouth daily.   Yes [provider]  sertraline (ZOLOFT) 100 MG tablet Take 1.5 tablets by mouth daily. 01/21/13  Yes [provider]  spironolactone (ALDACTONE) 25 MG tablet Take 1 tablet (25 mg total) by mouth daily. 08/11/22  Yes Wieting, Richard, MD  tiotropium (SPIRIVA) 18 MCG inhalation capsule Place 18 mcg into inhaler and inhale daily.   Yes [provider]    Review of Systems  Constitutional:  Positive for fatigue. Negative for appetite change.  HENT:  Positive for hearing loss. Negative for congestion, sinus pressure and sore throat.   Eyes: Negative.   Respiratory:  Positive for shortness of breath (easily). Negative for cough, chest tightness and wheezing.   Cardiovascular:  Positive for leg swelling (legs wrapped in UNNA wraps up to ankles). Negative for chest pain and palpitations.  Gastrointestinal:  Negative for abdominal distention and abdominal pain.  Endocrine: Negative.   Genitourinary: Negative.   Musculoskeletal:  Negative for back pain and neck pain.  Skin: Negative.   Allergic/Immunologic: Negative.   Neurological:  Positive for light-headedness (in the mornings). Negative for dizziness.  Hematological:  Negative for adenopathy. Does not bruise/bleed easily.  Psychiatric/Behavioral:  Negative for dysphoric mood and sleep disturbance (sleeping on 1 pillow with CPAP). The patient is not nervous/anxious.    Vitals:   08/15/22 1339  BP: 118/76  Pulse: 78  Resp: 18  SpO2: 91%   Wt Readings from Last 3 Encounters:  08/09/22 148 lb 9.6 oz (67.4 kg)  06/13/22 148 lb 4 oz (67.2 kg)  04/21/22 140 lb 4 oz (63.6 kg)   Lab Results  Component Value Date   CREATININE 0.89 08/10/2022   CREATININE 0.98 08/09/2022   CREATININE 0.91 08/08/2022   Physical Exam Vitals and nursing note  reviewed.  Constitutional:      Appearance: Normal appearance.  HENT:     Head: Normocephalic and atraumatic.  Cardiovascular:     Rate and Rhythm: Normal rate. Rhythm irregular.  Pulmonary:     Effort: Pulmonary effort is normal. No respiratory distress.     Breath sounds: No wheezing or rales.  Abdominal:     General: There is no distension.     Palpations: Abdomen is soft.     Tenderness: There is no abdominal tenderness.  Musculoskeletal:        General: No tenderness.     Cervical back: Normal range of motion and neck supple.     Right lower leg: Edema (trace pitting) present.     Left lower leg: Edema (trace pitting) present.  Skin:    General: Skin is warm and dry.  Comments: Both feet to ankles are wrapped in UNNA boots  Neurological:     General: No focal deficit present.     Mental Status: He is alert and oriented to person, place, and time.  Psychiatric:        Mood and Affect: Mood normal.        Behavior: Behavior normal.        Thought Content: Thought content normal.   Assessment & Plan:  1: Chronic heart failure with preserved ejection fraction with structural changes (LAE)- - NYHA class III - euvolemic today - weighing daily; reminded to call for an overnight weight gain of > 2 pounds or a weekly weight gain of > 5 pounds - too SOB to get out of the wheelchair and stand on scale - not adding salt  - could consider entresto and / or SGLT2 but need to be mindful of blood pressure as it's been lower in the past - both feet to ankles are wrapped in UN[employer] boots and he says that they get changed QOD - BNP 08/07/22 was 651.0  2: HTN- - BP 118/76 - saw PCP Graciela Husbands) 08/12/22 - BMP 08/10/22 reviewed and showed sodium 136, potassium 4.4, creatinine 0.89 and GFR >60  3: Atrial fibrillation- - saw cardiology Renaldo Reel) 10/25/21 - HR (I) today  4: Sleep apnea- - says that he's wearing his CPAP at bedtime - encouraged to also wear it if he takes a nap during the  day  5: 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   Return in 2 months, sooner if needed.

## 2022-08-15 ENCOUNTER — Encounter: Payer: Self-pay | Admitting: Family

## 2022-08-15 ENCOUNTER — Ambulatory Visit: Payer: Medicare Other | Attending: Family | Admitting: Family

## 2022-08-15 VITALS — BP 118/76 | HR 78 | Resp 18

## 2022-08-15 DIAGNOSIS — E079 Disorder of thyroid, unspecified: Secondary | ICD-10-CM | POA: Insufficient documentation

## 2022-08-15 DIAGNOSIS — K219 Gastro-esophageal reflux disease without esophagitis: Secondary | ICD-10-CM | POA: Insufficient documentation

## 2022-08-15 DIAGNOSIS — C7B8 Other secondary neuroendocrine tumors: Secondary | ICD-10-CM | POA: Diagnosis not present

## 2022-08-15 DIAGNOSIS — C3431 Malignant neoplasm of lower lobe, right bronchus or lung: Secondary | ICD-10-CM

## 2022-08-15 DIAGNOSIS — E785 Hyperlipidemia, unspecified: Secondary | ICD-10-CM | POA: Diagnosis not present

## 2022-08-15 DIAGNOSIS — G4733 Obstructive sleep apnea (adult) (pediatric): Secondary | ICD-10-CM | POA: Diagnosis not present

## 2022-08-15 DIAGNOSIS — I5032 Chronic diastolic (congestive) heart failure: Secondary | ICD-10-CM | POA: Diagnosis present

## 2022-08-15 DIAGNOSIS — Z8673 Personal history of transient ischemic attack (TIA), and cerebral infarction without residual deficits: Secondary | ICD-10-CM | POA: Insufficient documentation

## 2022-08-15 DIAGNOSIS — I4891 Unspecified atrial fibrillation: Secondary | ICD-10-CM | POA: Diagnosis not present

## 2022-08-15 DIAGNOSIS — I10 Essential (primary) hypertension: Secondary | ICD-10-CM

## 2022-08-15 DIAGNOSIS — I110 Hypertensive heart disease with heart failure: Secondary | ICD-10-CM | POA: Insufficient documentation

## 2022-08-15 DIAGNOSIS — Z87891 Personal history of nicotine dependence: Secondary | ICD-10-CM | POA: Diagnosis not present

## 2022-08-15 DIAGNOSIS — I482 Chronic atrial fibrillation, unspecified: Secondary | ICD-10-CM

## 2022-08-15 DIAGNOSIS — G4730 Sleep apnea, unspecified: Secondary | ICD-10-CM | POA: Insufficient documentation

## 2022-08-15 NOTE — Patient Instructions (Signed)
Continue weighing daily and call for an overnight weight gain of 3 pounds or more or a weekly weight gain of more than 5 pounds.   If you have voicemail, please make sure your mailbox is cleaned out so that we may leave a message and please make sure to listen to any voicemails.     

## 2022-08-29 ENCOUNTER — Inpatient Hospital Stay
Admission: EM | Admit: 2022-08-29 | Discharge: 2022-09-03 | DRG: 177 | Disposition: A | Discharge status: Home Health | Payer: Medicare Other | Attending: Internal Medicine | Admitting: Internal Medicine

## 2022-08-29 ENCOUNTER — Emergency Department: Payer: Medicare Other

## 2022-08-29 ENCOUNTER — Ambulatory Visit: Payer: Medicare Other | Admitting: Physician Assistant

## 2022-08-29 DIAGNOSIS — Z1152 Encounter for screening for COVID-19: Secondary | ICD-10-CM

## 2022-08-29 DIAGNOSIS — S91301A Unspecified open wound, right foot, initial encounter: Secondary | ICD-10-CM | POA: Diagnosis present

## 2022-08-29 DIAGNOSIS — K219 Gastro-esophageal reflux disease without esophagitis: Secondary | ICD-10-CM | POA: Diagnosis present

## 2022-08-29 DIAGNOSIS — R6520 Severe sepsis without septic shock: Secondary | ICD-10-CM | POA: Diagnosis not present

## 2022-08-29 DIAGNOSIS — C7B8 Other secondary neuroendocrine tumors: Secondary | ICD-10-CM | POA: Diagnosis present

## 2022-08-29 DIAGNOSIS — Z881 Allergy status to other antibiotic agents status: Secondary | ICD-10-CM

## 2022-08-29 DIAGNOSIS — J9601 Acute respiratory failure with hypoxia: Secondary | ICD-10-CM | POA: Diagnosis present

## 2022-08-29 DIAGNOSIS — G4733 Obstructive sleep apnea (adult) (pediatric): Secondary | ICD-10-CM | POA: Diagnosis present

## 2022-08-29 DIAGNOSIS — I10 Essential (primary) hypertension: Secondary | ICD-10-CM | POA: Diagnosis present

## 2022-08-29 DIAGNOSIS — J90 Pleural effusion, not elsewhere classified: Secondary | ICD-10-CM | POA: Diagnosis not present

## 2022-08-29 DIAGNOSIS — S91302A Unspecified open wound, left foot, initial encounter: Secondary | ICD-10-CM | POA: Diagnosis present

## 2022-08-29 DIAGNOSIS — I5033 Acute on chronic diastolic (congestive) heart failure: Secondary | ICD-10-CM | POA: Diagnosis present

## 2022-08-29 DIAGNOSIS — M1990 Unspecified osteoarthritis, unspecified site: Secondary | ICD-10-CM | POA: Diagnosis present

## 2022-08-29 DIAGNOSIS — J690 Pneumonitis due to inhalation of food and vomit: Secondary | ICD-10-CM | POA: Diagnosis present

## 2022-08-29 DIAGNOSIS — A419 Sepsis, unspecified organism: Secondary | ICD-10-CM | POA: Diagnosis not present

## 2022-08-29 DIAGNOSIS — I482 Chronic atrial fibrillation, unspecified: Secondary | ICD-10-CM | POA: Diagnosis present

## 2022-08-29 DIAGNOSIS — Z888 Allergy status to other drugs, medicaments and biological substances status: Secondary | ICD-10-CM | POA: Diagnosis not present

## 2022-08-29 DIAGNOSIS — S91309A Unspecified open wound, unspecified foot, initial encounter: Secondary | ICD-10-CM | POA: Diagnosis present

## 2022-08-29 DIAGNOSIS — Z85118 Personal history of other malignant neoplasm of bronchus and lung: Secondary | ICD-10-CM

## 2022-08-29 DIAGNOSIS — Z7901 Long term (current) use of anticoagulants: Secondary | ICD-10-CM | POA: Diagnosis not present

## 2022-08-29 DIAGNOSIS — Z820 Family history of epilepsy and other diseases of the nervous system: Secondary | ICD-10-CM

## 2022-08-29 DIAGNOSIS — Z87891 Personal history of nicotine dependence: Secondary | ICD-10-CM | POA: Diagnosis not present

## 2022-08-29 DIAGNOSIS — Z79899 Other long term (current) drug therapy: Secondary | ICD-10-CM

## 2022-08-29 DIAGNOSIS — R601 Generalized edema: Secondary | ICD-10-CM

## 2022-08-29 DIAGNOSIS — Z79890 Hormone replacement therapy (postmenopausal): Secondary | ICD-10-CM

## 2022-08-29 DIAGNOSIS — E876 Hypokalemia: Secondary | ICD-10-CM | POA: Diagnosis not present

## 2022-08-29 DIAGNOSIS — E039 Hypothyroidism, unspecified: Secondary | ICD-10-CM | POA: Diagnosis present

## 2022-08-29 DIAGNOSIS — I110 Hypertensive heart disease with heart failure: Secondary | ICD-10-CM | POA: Diagnosis present

## 2022-08-29 DIAGNOSIS — D638 Anemia in other chronic diseases classified elsewhere: Secondary | ICD-10-CM | POA: Diagnosis present

## 2022-08-29 DIAGNOSIS — N179 Acute kidney failure, unspecified: Secondary | ICD-10-CM | POA: Diagnosis present

## 2022-08-29 DIAGNOSIS — I081 Rheumatic disorders of both mitral and tricuspid valves: Secondary | ICD-10-CM | POA: Diagnosis present

## 2022-08-29 DIAGNOSIS — E785 Hyperlipidemia, unspecified: Secondary | ICD-10-CM | POA: Diagnosis present

## 2022-08-29 DIAGNOSIS — R0602 Shortness of breath: Secondary | ICD-10-CM | POA: Diagnosis not present

## 2022-08-29 LAB — CBC WITH DIFFERENTIAL/PLATELET
Abs Immature Granulocytes: 0.04 K/uL (ref 0.00–0.07)
Basophils Absolute: 0 K/uL (ref 0.0–0.1)
Basophils Relative: 0 %
Eosinophils Absolute: 0 K/uL (ref 0.0–0.5)
Eosinophils Relative: 0 %
HCT: 37.5 % — ABNORMAL LOW (ref 39.0–52.0)
Hemoglobin: 11.4 g/dL — ABNORMAL LOW (ref 13.0–17.0)
Immature Granulocytes: 1 %
Lymphocytes Relative: 4 %
Lymphs Abs: 0.3 K/uL — ABNORMAL LOW (ref 0.7–4.0)
MCH: 28.9 pg (ref 26.0–34.0)
MCHC: 30.4 g/dL (ref 30.0–36.0)
MCV: 95.2 fL (ref 80.0–100.0)
Monocytes Absolute: 0.5 K/uL (ref 0.1–1.0)
Monocytes Relative: 8 %
Neutro Abs: 6.1 K/uL (ref 1.7–7.7)
Neutrophils Relative %: 87 %
Platelets: 194 K/uL (ref 150–400)
RBC: 3.94 MIL/uL — ABNORMAL LOW (ref 4.22–5.81)
RDW: 20.4 % — ABNORMAL HIGH (ref 11.5–15.5)
WBC: 7 K/uL (ref 4.0–10.5)
nRBC: 0.3 % — ABNORMAL HIGH (ref 0.0–0.2)

## 2022-08-29 LAB — COMPREHENSIVE METABOLIC PANEL WITH GFR
ALT: 87 U/L — ABNORMAL HIGH (ref 0–44)
AST: 150 U/L — ABNORMAL HIGH (ref 15–41)
Albumin: 3.1 g/dL — ABNORMAL LOW (ref 3.5–5.0)
Alkaline Phosphatase: 184 U/L — ABNORMAL HIGH (ref 38–126)
Anion gap: 16 — ABNORMAL HIGH (ref 5–15)
BUN: 47 mg/dL — ABNORMAL HIGH (ref 8–23)
CO2: 25 mmol/L (ref 22–32)
Calcium: 8.6 mg/dL — ABNORMAL LOW (ref 8.9–10.3)
Chloride: 93 mmol/L — ABNORMAL LOW (ref 98–111)
Creatinine, Ser: 1.64 mg/dL — ABNORMAL HIGH (ref 0.61–1.24)
GFR, Estimated: 42 mL/min — ABNORMAL LOW
Glucose, Bld: 148 mg/dL — ABNORMAL HIGH (ref 70–99)
Potassium: 3.6 mmol/L (ref 3.5–5.1)
Sodium: 134 mmol/L — ABNORMAL LOW (ref 135–145)
Total Bilirubin: 1.7 mg/dL — ABNORMAL HIGH (ref 0.3–1.2)
Total Protein: 7.3 g/dL (ref 6.5–8.1)

## 2022-08-29 LAB — LACTIC ACID, PLASMA
Lactic Acid, Venous: 2.4 mmol/L (ref 0.5–1.9)
Lactic Acid, Venous: 2.1 mmol/L (ref 0.5–1.9)
Lactic Acid, Venous: 1.6 mmol/L (ref 0.5–1.9)
Lactic Acid, Venous: 1.7 mmol/L (ref 0.5–1.9)

## 2022-08-29 LAB — PROCALCITONIN: Procalcitonin: 0.58 ng/mL

## 2022-08-29 LAB — BRAIN NATRIURETIC PEPTIDE: B Natriuretic Peptide: 1457.7 pg/mL — ABNORMAL HIGH (ref 0.0–100.0)

## 2022-08-29 MED ORDER — SODIUM CHLORIDE 0.9 % IV SOLN
3.0000 g | Freq: Once | INTRAVENOUS | Status: AC
  Administered 2022-08-29: 3 g via INTRAVENOUS
  Filled 2022-08-29: qty 8

## 2022-08-29 MED ORDER — ACETAMINOPHEN 325 MG PO TABS: 650.0000 mg | ORAL_TABLET | Freq: Four times a day (QID) | ORAL | Status: DC | PRN

## 2022-08-29 MED ORDER — RIVAROXABAN 15 MG PO TABS
15.0000 mg | ORAL_TABLET | Freq: Every day | ORAL | Status: AC
  Administered 2022-08-29: 15 mg via ORAL
  Filled 2022-08-29: qty 1

## 2022-08-29 MED ORDER — ROSUVASTATIN CALCIUM 10 MG PO TABS
10.0000 mg | ORAL_TABLET | Freq: Every day | ORAL | Status: DC
  Administered 2022-08-29 – 2022-09-02 (×5): 10 mg via ORAL
  Filled 2022-08-29 (×6): qty 1

## 2022-08-29 MED ORDER — ACETAMINOPHEN 650 MG RE SUPP: 650.0000 mg | Freq: Four times a day (QID) | RECTAL | Status: DC | PRN

## 2022-08-29 MED ORDER — POLYETHYLENE GLYCOL 3350 17 G PO PACK: 17.0000 g | PACK | Freq: Every day | ORAL | Status: DC | PRN

## 2022-08-29 MED ORDER — FUROSEMIDE 10 MG/ML IJ SOLN
40.0000 mg | Freq: Once | INTRAMUSCULAR | Status: AC
  Administered 2022-08-29: 40 mg via INTRAVENOUS
  Filled 2022-08-29: qty 4

## 2022-08-29 MED ORDER — ONDANSETRON HCL 4 MG PO TABS: 4.0000 mg | ORAL_TABLET | Freq: Four times a day (QID) | ORAL | Status: DC | PRN

## 2022-08-29 MED ORDER — SERTRALINE HCL 50 MG PO TABS
150.0000 mg | ORAL_TABLET | Freq: Every day | ORAL | Status: DC
  Administered 2022-08-30 – 2022-09-03 (×5): 150 mg via ORAL
  Filled 2022-08-29 (×5): qty 3

## 2022-08-29 MED ORDER — SODIUM CHLORIDE 0.9 % IV SOLN: INTRAVENOUS | Status: DC

## 2022-08-29 MED ORDER — TIOTROPIUM BROMIDE MONOHYDRATE 18 MCG IN CAPS
18.0000 ug | ORAL_CAPSULE | Freq: Every day | RESPIRATORY_TRACT | Status: DC
  Administered 2022-08-30 – 2022-09-03 (×5): 18 ug via RESPIRATORY_TRACT
  Filled 2022-08-29 (×3): qty 5

## 2022-08-29 MED ORDER — ONDANSETRON HCL 4 MG/2ML IJ SOLN: 4.0000 mg | Freq: Four times a day (QID) | INTRAMUSCULAR | Status: DC | PRN

## 2022-08-29 MED ORDER — SODIUM CHLORIDE 0.9 % IV SOLN
3.0000 g | Freq: Four times a day (QID) | INTRAVENOUS | Status: AC
  Administered 2022-08-29 – 2022-09-02 (×17): 3 g via INTRAVENOUS
  Filled 2022-08-29: qty 3
  Filled 2022-08-29 (×8): qty 8
  Filled 2022-08-29: qty 3
  Filled 2022-08-29 (×4): qty 8
  Filled 2022-08-29: qty 3
  Filled 2022-08-29 (×2): qty 8

## 2022-08-29 MED ORDER — LEVOTHYROXINE SODIUM 50 MCG PO TABS
175.0000 ug | ORAL_TABLET | Freq: Every day | ORAL | Status: DC
  Administered 2022-08-30 – 2022-09-03 (×5): 175 ug via ORAL
  Filled 2022-08-29 (×3): qty 1
  Filled 2022-08-29: qty 4
  Filled 2022-08-29: qty 1

## 2022-08-29 NOTE — Consult Note (Signed)
[place] for Xarelto Indication: atrial fibrillation  Patient Measurements: Weight: 73.5 kg (162 lb 0.6 oz)  Labs: Recent Labs    08/29/22 1316  HGB 11.4*  HCT 37.5*  PLT 194  CREATININE 1.64*    Estimated Creatinine Clearance: 32.4 mL/min (A) (by C-G formula based on SCr of 1.64 mg/dL (H)).   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 Pharmacy consulted for Xarelto dosing for Afib.  CrCl TBW (Age 40, Scr 1.64, Wt 73.5 kg): 37 mL/min  Plan:  --Xarelto 15 mg x 1 dose tonight, reduced due to AKI / CrCl < 50 mL/min --Follow-up renal function tomorrow for further dosing --Noted history of cirrhosis; Xarelto not recommended in Child-Pugh class B / C --CBC per protocol  Benita Gutter 08/29/2022,8:32 PM

## 2022-08-29 NOTE — Assessment & Plan Note (Addendum)
Holding antihypertensives given borderline soft blood pressures and sepsis.

## 2022-08-29 NOTE — Assessment & Plan Note (Signed)
Meets criteria for severe sepsis given lactic acidosis, pneumonia source, tachypnea and tachycardia with some hypotension.  Respiratory panel negative.  On empiric Unasyn.  So far blood cultures are negative.

## 2022-08-29 NOTE — Assessment & Plan Note (Signed)
Continue Synthroid °

## 2022-08-29 NOTE — ED Notes (Signed)
Attempted to call Quentin Cornwall, MD, about critical lactic result without success. Message sent to make aware. Lactic 2.4

## 2022-08-29 NOTE — Assessment & Plan Note (Signed)
Continue outpatient follow-up with oncology

## 2022-08-29 NOTE — Assessment & Plan Note (Addendum)
Started on Unasyn.  Has chronic right pleural effusion will see if we are able to get a thoracentesis or not.

## 2022-08-29 NOTE — ED Notes (Signed)
Patient rounded on, given medication. Denies needs at this time.

## 2022-08-29 NOTE — ED Notes (Signed)
Patient resting supine on bed with respirations even and non labored. Patient without signs of distress, denies needs at this time.

## 2022-08-29 NOTE — Consult Note (Signed)
Pharmacy Antibiotic Note  Louis Sanchez is a 82 y.o. male with PMH including neuroendocrine tumor with lung metastases, lung cancer s/p right lower lobectomy, neuropathy, liver cirrhosis, Afib on Xarelto, hypothyroidism admitted on 08/29/2022 with  shortness of breath / falls .  Pharmacy has been consulted for Unasyn dosing for aspiration pneumonia.  Plan:  Unasyn 3 g IV q6h  Weight: 73.5 kg (162 lb 0.6 oz)  Temp (24hrs), Avg:97.6 F (36.4 C), Min:97.6 F (36.4 C), Max:97.6 F (36.4 C)  Recent Labs  Lab 08/29/22 1316 08/29/22 1530 08/29/22 1829  WBC 7.0  --   --   CREATININE 1.64*  --   --   LATICACIDVEN  --  2.4* 2.1*    Estimated Creatinine Clearance: 32.4 mL/min (A) (by C-G formula based on SCr of 1.64 mg/dL (H)).    Allergies  Allergen Reactions   Atorvastatin     Other reaction(s): Muscle Pain Other reaction(s): Muscle Pain Other reaction(s): Muscle Pain Other reaction(s): Muscle Pain Other reaction(s): Muscle Pain    Levofloxacin Rash    Antimicrobials this admission: Unasyn 2/5 >>   Dose adjustments this admission: N/A  Microbiology results: 2/5 BCx: pending 2/5 RVP: pending  Thank you for allowing pharmacy to be a part of this patient's care.  Benita Gutter 08/29/2022 8:30 PM

## 2022-08-29 NOTE — Assessment & Plan Note (Signed)
Remains rate controlled.  Still in atrial fibrillation.  Renally dose adjust Xarelto.

## 2022-08-29 NOTE — ED Provider Notes (Signed)
[facility] Provider Note    Event Date/Time   First MD Initiated Contact with Patient 08/29/22 1324     (approximate)   History   Shortness of Breath   HPI  Louis Sanchez is a 82 y.o. male presents to the ER for evaluation of worsening shortness of breath particular exertional dyspnea despite increasing p.o. Lasix.  Reportedly has had some increasing falls at the facility.  Does not wear home oxygen.  Found to be hypoxic.     Physical Exam   Triage Vital Signs: ED Triage Vitals [08/29/22 1326]  Enc Vitals Group     BP (!) 99/58     Pulse Rate 71     Resp (!) 26     Temp 97.6 F (36.4 C)     Temp Source Oral     SpO2 91 %     Weight      Height      Head Circumference      Peak Flow      Pain Score      Pain Loc      Pain Edu?      Excl. in [place]?     Most recent vital signs: Vitals:   08/29/22 1326 08/29/22 1330  BP: (!) 99/58 105/68  Pulse: 71 69  Resp: (!) 26 (!) 21  Temp: 97.6 F (36.4 C)   SpO2: 91% 100%     Constitutional: Alert, chronically ill appearing Eyes: Conjunctivae are normal.  Head: Atraumatic. Nose: No congestion/rhinnorhea. Mouth/Throat: Mucous membranes are moist.   Neck: Painless ROM.  Cardiovascular:   Good peripheral circulation. Respiratory: Normal respiratory effort.  No retractions.  Gastrointestinal: Soft and nontender.  Musculoskeletal:  no deformity Neurologic:  MAE spontaneously. No gross focal neurologic deficits are appreciated.  Skin:  Skin is warm, dry and intact. No rash noted. Psychiatric: Mood and affect are normal. Speech and behavior are normal.    ED Results / Procedures / Treatments   Labs (all labs ordered are listed, but only abnormal results are displayed) Labs Reviewed  CBC WITH DIFFERENTIAL/PLATELET - Abnormal; Notable for the following components:      Result Value   RBC 3.94 (*)    Hemoglobin 11.4 (*)    HCT 37.5 (*)    RDW 20.4 (*)    nRBC 0.3 (*)    Lymphs Abs 0.3  (*)    All other components within normal limits  COMPREHENSIVE METABOLIC PANEL - Abnormal; Notable for the following components:   Sodium 134 (*)    Chloride 93 (*)    Glucose, Bld 148 (*)    BUN 47 (*)    Creatinine, Ser 1.64 (*)    Calcium 8.6 (*)    Albumin 3.1 (*)    AST 150 (*)    ALT 87 (*)    Alkaline Phosphatase 184 (*)    Total Bilirubin 1.7 (*)    GFR, Estimated 42 (*)    Anion gap 16 (*)    All other components within normal limits  BRAIN NATRIURETIC PEPTIDE - Abnormal; Notable for the following components:   B Natriuretic Peptide 1,457.7 (*)    All other components within normal limits  CULTURE, BLOOD (ROUTINE X 2)  CULTURE, BLOOD (ROUTINE X 2)  LACTIC ACID, PLASMA  LACTIC ACID, PLASMA     EKG  ED ECG REPORT I, Merlyn Lot, the attending physician, personally viewed and interpreted this ECG.   Date: 08/29/2022  EKG Time: 13:26  Rate: 70  Rhythm: afib  Axis: normal  Intervals: prolonged qt  ST&T Change: no stemi    RADIOLOGY Mix one tablespoon with 8oz of your favorite juice or water every day until you are having soft formed stools. Then start taking once daily if you didn't have a stool the day before.    PROCEDURES:  Critical Care performed: Yes, see critical care procedure note(s)  .Critical Care  Performed by: Merlyn Lot, MD Authorized by: Merlyn Lot, MD   Critical care provider statement:    Critical care time (minutes):  40   Critical care was necessary to treat or prevent imminent or life-threatening deterioration of the following conditions:  Respiratory failure   Critical care was time spent personally by me on the following activities:  Ordering and performing treatments and interventions, ordering and review of laboratory studies, ordering and review of radiographic studies, pulse oximetry, re-evaluation of patient's condition, review of old charts, obtaining history from patient or surrogate, examination of patient,  evaluation of patient's response to treatment, discussions with primary provider, discussions with consultants and development of treatment plan with patient or surrogate    MEDICATIONS ORDERED IN ED: Medications  furosemide (LASIX) injection 40 mg (has no administration in time range)  Ampicillin-Sulbactam (UNASYN) 3 g in sodium chloride 0.9 % 100 mL IVPB (has no administration in time range)     IMPRESSION / MDM / [place] / ED COURSE  I reviewed the triage vital signs and the nursing notes.                              Differential diagnosis includes, but is not limited to, Asthma, copd, CHF, pna, ptx, malignancy, Pe, anemia, sdh, iph, ptx, htx  Patient presenting to the ER for evaluation of symptoms as described above.  Based on symptoms, risk factors and considered above differential, this presenting complaint could reflect a potentially life-threatening illness therefore the patient will be placed on continuous pulse oximetry and telemetry for monitoring.  Laboratory evaluation will be sent to evaluate for the above complaints.  Imaging will be ordered for the above differential.  With history concerning for traumatic injury.  No abdominal pain however contusion and healing abrasion to the right upper hemithorax.  Also with worsening lower extremity swelling concerning for worsening CHF.  Protecting airway on supplemental oxygen.     Clinical Course as of 08/29/22 1515  Mon Aug 29, 2022  1356 CXR on my review and interpretation with opacification/ effusion of the right hemithorax [PR]  1436 Significant drop in his renal function. [PR]  6644 BNP is elevated as compared to prior. [PR]  0347 CT imaging with evidence of chronic loculated effusion now with worsening edema anasarca no sign of trauma but also with evidence of probable aspiration pneumonia.  No leukocytosis no fever.  Will draw cultures will order Unasyn.  Will give dose of IV Lasix.  Will consult hospitalist for  admission. [PR]    Clinical Course User Index [PR] Merlyn Lot, MD      FINAL CLINICAL IMPRESSION(S) / ED DIAGNOSES   Final diagnoses:  Acute respiratory failure with hypoxia ([place])  Aspiration pneumonia of right lower lobe, unspecified aspiration pneumonia type ([place])  Anasarca     Rx / DC Orders   ED Discharge Orders     None        Note:  This document was prepared using Dragon voice recognition software and  may include unintentional dictation errors.    Merlyn Lot, MD 08/29/22 1515

## 2022-08-29 NOTE — H&P (Signed)
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  Patient presented to the emergency room on 2/5 from his assisted living facility with increasing dyspnea on exertion.  In the emergency room, patient found to have a BNP of almost 1500, acute kidney injury with a creatinine of 1.6, initial hypoxia requiring 6 L and lactic acid level of 2.4.  CT scan of the chest which noted new consolidation right lower lobe concerning for pneumonia versus aspiration pneumonitis as well as CHF.  Patient given IV antibiotics and Lasix and admitted to the hospitalist service for severe sepsis secondary to aspiration pneumonia as well as acute on chronic diastolic heart failure.  Review of Systems: As stated above.  Patient complains of some shortness of breath although much improved from previous. 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 His smoking use included cigarettes. He has never used smokeless tobacco. He reports that he does not currently use alcohol after a past usage of about 15.0 standard drinks of alcohol per week. He reports that he does not use drugs.  Ambulates with assistance using a walker  Allergies  Allergen Reactions   Atorvastatin     Other reaction(s): Muscle Pain Other reaction(s): Muscle Pain Other reaction(s): Muscle Pain Other reaction(s): Muscle Pain Other reaction(s): Muscle Pain    Levofloxacin Rash    Family History  Problem Relation Age of Onset   Alzheimer's disease Mother     Prior to Admission medications   Medication Sig Start Date End Date Taking? Authorizing Provider  Ferrous Sulfate (IRON PO) Take 325 mg by mouth daily.   Yes [provider]  furosemide (LASIX) 40 MG tablet Take 1 tablet (40 mg total) by mouth 2 (two) times daily. 08/10/22  Yes Wieting, Richard, MD  gabapentin (NEURONTIN) 300 MG capsule Take 1 capsule (300 mg total) by mouth 3 (three) times daily. 08/10/22  Yes Loletha Grayer, MD  levothyroxine (SYNTHROID) 175 MCG  tablet Take 175 mcg by mouth daily before breakfast. Every day but Sunday 12/30/20  Yes [provider]  metoprolol succinate (TOPROL XL) 25 MG 24 hr tablet Take 0.5 tablets (12.5 mg total) by mouth at bedtime. Patient taking differently: Take 25 mg by mouth at bedtime. 08/10/22  Yes Wieting, Richard, MD  Multiple Vitamin (MULTIVITAMIN) tablet Take 1 tablet by mouth daily.   Yes [provider]  rivaroxaban (XARELTO) 20 MG TABS tablet Take 20 mg by mouth daily with supper.   Yes  [provider]  rosuvastatin (CRESTOR) 10 MG tablet Take 10 mg by mouth daily.   Yes [provider]  sertraline (ZOLOFT) 100 MG tablet Take 1.5 tablets by mouth daily. 01/21/13  Yes [provider]  spironolactone (ALDACTONE) 25 MG tablet Take 1 tablet (25 mg total) by mouth daily. 08/11/22  Yes Wieting, Richard, MD  tiotropium (SPIRIVA) 18 MCG inhalation capsule Place 18 mcg into inhaler and inhale daily.   Yes [provider]  nitroGLYCERIN (NITROSTAT) 0.4 MG SL tablet Place under the tongue. 09/01/20   [provider]    Physical Exam: Vitals:   08/29/22 1630 08/29/22 1930 08/29/22 1951 08/29/22 2026  BP: 114/71 100/68    Pulse: 62 68 73 64  Resp: 14 14 15 14   Temp:   97.6 F (36.4 C)   TempSrc:   Oral   SpO2: 93% 96% 95% 96%  Weight:    73.5 kg   General: Alert and oriented x 2-3, no acute distress HEENT: Normocephalic atraumatic, mucous membranes are slightly dry Neck: Supple, no JVD Cardiovascular: Irregular rhythm, rate controlled Lungs: Poor inspiratory effort, decreased breath sounds bibasilar Abdomen: Soft, nontender, nondistended, positive bowel sounds Extremities: No clubbing or cyanosis, trace pitting edema Skin: Skin is markedly dry, no skin breaks, tears or lesions Neuro: No focal deficits Psychiatry: Patient is appropriate, no evidence of psychoses  Data Reviewed:  Lactic acid level 2.4 then down to 2.1.  Stable hemoglobin of 11.4.  BNP of almost 1500.  Creatinine of 1.6.  Assessment and Plan: * Severe sepsis ([place]) Meets criteria for severe sepsis given lactic acidosis, pneumonia source, tachypnea and tachycardia with some hypotension.  Continue IV fluids and antibiotics.  Blood cultures are pending.  Acute respiratory failure with hypoxia (HCC) Initially required 6 L, but has quickly improved.  May have some worsening of hypoxia as we aggressively hydrate to treat sepsis, but this should improve once we start  diuresing.  Will place in progressive care.  Continue oxygen support.  For completeness sake, checking RSV, flu and COVID titers.  Aspiration pneumonia ([place]) Will have speech therapy check swallow evaluation.  Cover with broad-spectrum antibiotics.  Also noted to have chronic loculated effusion.  No evidence of empyema.  Checking procalcitonin.  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 heart failure) ([place]) Hold off on Lasix until lactic acidosis has resolved, then can aggressively diurese.  Interestingly, echocardiogram in September of last year noted preserved ejection fraction and normal diastolic function although did have moderate mitral and tricuspid regurg.  Chronic atrial fibrillation (HCC) Remains rate controlled.  Still in atrial fibrillation.  Renally dose adjust Xarelto.  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  May also be a component of decompensated heart failure.  For now, aggressively hydrating and then once lactic acidosis has resolved, will start diuresing.  Follow renal function.  Essential hypertension Holding antihypertensives given  borderline soft blood pressures and sepsis.  Hypothyroidism Continue Synthroid      Advance Care Planning:   Code Status: Full Code, as confirmed by patient  Consults: None  Family Communication: No family present.  Will attempt to call spouse  Severity of Illness: The appropriate patient status for this patient is INPATIENT. Inpatient status is judged to be reasonable and necessary in order to provide the required intensity of service to ensure the patient's safety. The patient's presenting symptoms, physical exam findings, and initial radiographic and laboratory data in the context of their chronic comorbidities is felt to place them at high risk for further clinical deterioration. Furthermore, it is  not anticipated that the patient will be medically stable for discharge from the hospital within 2 midnights of admission.   * I certify that at the point of admission it is my clinical judgment that the patient will require inpatient hospital care spanning beyond 2 midnights from the point of admission due to high intensity of service, high risk for further deterioration and high frequency of surveillance required.*  Author: Annita Brod, MD 08/29/2022 9:02 PM  For on call review [email or web address].

## 2022-08-29 NOTE — Hospital Course (Signed)
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  Patient presented to the emergency room on 2/5 from his assisted living facility with increasing dyspnea on exertion.  In the emergency room, patient found to have a BNP of almost 1500, acute kidney injury with a creatinine of 1.6, initial hypoxia requiring 6 L and lactic acid level of 2.4.  CT scan of the chest which noted new consolidation right lower lobe concerning for pneumonia versus aspiration pneumonitis as well as CHF.  Patient given IV antibiotics and Lasix and admitted to the hospitalist service for severe sepsis secondary to aspiration pneumonia as well as acute on chronic diastolic heart failure.  2/6.  Patient have right-sided pneumonia and right pleural effusion.  Spoke with patient about possible thoracentesis.  Patient also being treated for congestive heart failure with IV Lasix.  Patient on oxygen this morning and normally does not wear oxygen.  Patient has chronic wounds going on for years.

## 2022-08-29 NOTE — Assessment & Plan Note (Addendum)
Watch closely with diuresis.

## 2022-08-29 NOTE — Assessment & Plan Note (Addendum)
Initially required 6 L, but has quickly improved.  May have some worsening of hypoxia as we aggressively hydrate to treat sepsis, but this should improve once we start diuresing.  Will place in progressive care.  Continue oxygen support.  For completeness sake, checking RSV, flu and COVID titers.

## 2022-08-29 NOTE — ED Triage Notes (Signed)
Pt to ED via [facility] from [place]. Pt reports shortness of breath when standing and walking. Per staff, pt has had SOB, slurred speech, and multiple falls since Friday. Bruises noted on right chest. Per EMS, right side breath sounds are diminished.

## 2022-08-29 NOTE — Assessment & Plan Note (Signed)
Lasix 40 mg IV twice daily.  Watch creatinine closely.  Echocardiogram in September showed a normal EF.

## 2022-08-30 ENCOUNTER — Other Ambulatory Visit: Payer: Self-pay

## 2022-08-30 ENCOUNTER — Inpatient Hospital Stay: Payer: Medicare Other

## 2022-08-30 ENCOUNTER — Encounter: Payer: Self-pay | Admitting: Internal Medicine

## 2022-08-30 DIAGNOSIS — D638 Anemia in other chronic diseases classified elsewhere: Secondary | ICD-10-CM

## 2022-08-30 DIAGNOSIS — I5033 Acute on chronic diastolic (congestive) heart failure: Secondary | ICD-10-CM | POA: Diagnosis not present

## 2022-08-30 DIAGNOSIS — S91309A Unspecified open wound, unspecified foot, initial encounter: Secondary | ICD-10-CM

## 2022-08-30 DIAGNOSIS — I10 Essential (primary) hypertension: Secondary | ICD-10-CM

## 2022-08-30 DIAGNOSIS — E039 Hypothyroidism, unspecified: Secondary | ICD-10-CM

## 2022-08-30 DIAGNOSIS — N179 Acute kidney failure, unspecified: Secondary | ICD-10-CM

## 2022-08-30 DIAGNOSIS — C7B8 Other secondary neuroendocrine tumors: Secondary | ICD-10-CM

## 2022-08-30 DIAGNOSIS — E876 Hypokalemia: Secondary | ICD-10-CM

## 2022-08-30 DIAGNOSIS — J9601 Acute respiratory failure with hypoxia: Secondary | ICD-10-CM | POA: Diagnosis not present

## 2022-08-30 DIAGNOSIS — J690 Pneumonitis due to inhalation of food and vomit: Secondary | ICD-10-CM | POA: Diagnosis not present

## 2022-08-30 DIAGNOSIS — J90 Pleural effusion, not elsewhere classified: Secondary | ICD-10-CM

## 2022-08-30 DIAGNOSIS — A419 Sepsis, unspecified organism: Secondary | ICD-10-CM | POA: Diagnosis not present

## 2022-08-30 LAB — RESPIRATORY PANEL BY PCR

## 2022-08-30 LAB — CBC
HCT: 32.8 % — ABNORMAL LOW (ref 39.0–52.0)
Hemoglobin: 9.9 g/dL — ABNORMAL LOW (ref 13.0–17.0)
MCH: 28.9 pg (ref 26.0–34.0)
MCHC: 30.2 g/dL (ref 30.0–36.0)
MCV: 95.6 fL (ref 80.0–100.0)
Platelets: 159 10*3/uL (ref 150–400)
RBC: 3.43 MIL/uL — ABNORMAL LOW (ref 4.22–5.81)
RDW: 20.4 % — ABNORMAL HIGH (ref 11.5–15.5)
WBC: 5.8 10*3/uL (ref 4.0–10.5)
nRBC: 0 % (ref 0.0–0.2)

## 2022-08-30 LAB — BASIC METABOLIC PANEL
Anion gap: 8 (ref 5–15)
BUN: 43 mg/dL — ABNORMAL HIGH (ref 8–23)
CO2: 30 mmol/L (ref 22–32)
Calcium: 8 mg/dL — ABNORMAL LOW (ref 8.9–10.3)
Chloride: 100 mmol/L (ref 98–111)
Creatinine, Ser: 1.56 mg/dL — ABNORMAL HIGH (ref 0.61–1.24)
GFR, Estimated: 45 mL/min — ABNORMAL LOW (ref >60)
Glucose, Bld: 94 mg/dL (ref 70–99)
Potassium: 3 mmol/L — ABNORMAL LOW (ref 3.5–5.1)
Sodium: 138 mmol/L (ref 135–145)

## 2022-08-30 LAB — LACTIC ACID, PLASMA: Lactic Acid, Venous: 1.5 mmol/L (ref 0.5–1.9)

## 2022-08-30 MED ORDER — FUROSEMIDE 10 MG/ML IJ SOLN: 40.0000 mg | Freq: Two times a day (BID) | INTRAMUSCULAR | Status: DC

## 2022-08-30 MED ORDER — FUROSEMIDE 10 MG/ML IJ SOLN
40.0000 mg | Freq: Two times a day (BID) | INTRAMUSCULAR | Status: DC
  Administered 2022-08-31 – 2022-09-01 (×3): 40 mg via INTRAVENOUS
  Filled 2022-08-30 (×3): qty 4

## 2022-08-30 MED ORDER — PANTOPRAZOLE SODIUM 40 MG PO TBEC
40.0000 mg | DELAYED_RELEASE_TABLET | Freq: Every day | ORAL | Status: DC
  Administered 2022-08-30 – 2022-09-03 (×5): 40 mg via ORAL
  Filled 2022-08-30 (×5): qty 1

## 2022-08-30 MED ORDER — MIDODRINE HCL 5 MG PO TABS
5.0000 mg | ORAL_TABLET | Freq: Three times a day (TID) | ORAL | Status: DC
  Administered 2022-08-30 – 2022-09-03 (×13): 5 mg via ORAL
  Filled 2022-08-30 (×13): qty 1

## 2022-08-30 MED ORDER — POTASSIUM CHLORIDE CRYS ER 20 MEQ PO TBCR
40.0000 meq | EXTENDED_RELEASE_TABLET | Freq: Two times a day (BID) | ORAL | Status: DC
  Administered 2022-08-30 – 2022-09-03 (×9): 40 meq via ORAL
  Filled 2022-08-30 (×9): qty 2

## 2022-08-30 MED ORDER — FUROSEMIDE 10 MG/ML IJ SOLN
40.0000 mg | Freq: Once | INTRAMUSCULAR | Status: AC
  Administered 2022-08-30: 40 mg via INTRAVENOUS
  Filled 2022-08-30: qty 4

## 2022-08-30 MED ORDER — RIVAROXABAN 15 MG PO TABS
15.0000 mg | ORAL_TABLET | Freq: Every day | ORAL | Status: DC
  Administered 2022-08-30 – 2022-09-03 (×5): 15 mg via ORAL
  Filled 2022-08-30 (×5): qty 1

## 2022-08-30 NOTE — Progress Notes (Signed)
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  Patient presented to the emergency room on 2/5 from his assisted living facility with increasing dyspnea on exertion.  In the emergency room, patient found to have a BNP of almost 1500, acute kidney injury with a creatinine of 1.6, initial hypoxia requiring 6 L and lactic acid level of 2.4.  CT scan of the chest which noted new consolidation right lower lobe concerning for pneumonia versus aspiration pneumonitis as well as CHF.  Patient given IV antibiotics and Lasix and admitted to the hospitalist service for severe sepsis secondary to aspiration pneumonia as well as acute on chronic diastolic heart failure.  2/6.  Patient have right-sided pneumonia and right pleural effusion.  Spoke with patient about possible thoracentesis.  Patient also being treated for congestive heart failure with IV Lasix.  Patient on oxygen this morning and normally does not wear oxygen.  Patient has chronic wounds going on for years.  Assessment and Plan: * Severe sepsis ([place]) Meets criteria for severe sepsis given lactic acidosis, pneumonia source, tachypnea and tachycardia with some hypotension.  Respiratory panel negative.  On empiric Unasyn.  So far blood cultures are negative.  Aspiration pneumonia ([place]) Started on Unasyn.  Has chronic right pleural effusion will see if we are able to get a thoracentesis or not.  Acute respiratory failure with hypoxia (HCC) Initially required 6 L and pulse ox was 91%.  Patient tapered  down to 2 L.  Will get an ambulatory room air pulse ox.  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 heart failure) (HCC) Lasix 40 mg IV twice daily.  Watch creatinine closely.  Echocardiogram in September showed a normal EF.  AKI (acute kidney injury) ([place]) Watch closely with diuresis.  Chronic atrial fibrillation (HCC) Remains rate controlled.  Still in atrial fibrillation.  Renally dose adjust Xarelto.  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  Hypothyroidism Continue Synthroid  Hypokalemia Replace potassium orally  Wound of foot Wounds of bilateral feet top of the foot and medial right ankle.  See pictures below, present on admission.  Appreciate wound care consultation.  Check CRP and ESR.  Pleural effusion on right Ultrasound to see if enough fluid to drop off.        Subjective: Patient seen this morning and awakened from sleep.  Patient states he does not wear oxygen at home.  He states he has chronic wounds that have been going on for a while.  Physical Exam: Vitals:   08/30/22 0830 08/30/22 0951 08/30/22 1001 08/30/22 1208  BP: 107/64  97/61 101/67  Pulse: 72  69 70  Resp: (!) 22  16 17   Temp:  (!) 97.1 F (36.2 C)    TempSrc:  Axillary    SpO2: 94%  92% 96%  Weight:      Height:       Physical Exam HENT:     Head: Normocephalic.  Mouth/Throat:     Pharynx: No oropharyngeal exudate.  Eyes:     General: Lids are normal.     Conjunctiva/sclera: Conjunctivae normal.  Cardiovascular:     Rate and Rhythm: Normal rate and regular rhythm.     Heart sounds: Normal heart sounds, S1 normal and S2 normal.  Pulmonary:     Breath sounds: Examination of the right-middle field reveals rales. Examination of the right-lower field reveals decreased breath  sounds and rales. Examination of the left-lower field reveals decreased breath sounds and rales. Decreased breath sounds and rales present. No wheezing or rhonchi.  Abdominal:     Palpations: Abdomen is soft.     Tenderness: There is no abdominal tenderness.  Musculoskeletal:     Right lower leg: Swelling present.     Left lower leg: Swelling present.  Skin:    General: Skin is warm.     Comments: See pictures below  Neurological:     Mental Status: He is alert and oriented to person, place, and time.          Data Reviewed: Hemoglobin 9.9, white blood cell count 5.8, platelet count 159, sodium 138, potassium 3.0, creatinine 1.56, AST 150, ALT 87, total bilirubin 1.7, alkaline phosphatase 184  Family Communication: Left message for son on the phone  Disposition: Status is: Inpatient Remains inpatient appropriate because: Continue IV Lasix and IV antibiotics.  See if there is enough fluid for thoracentesis.  Planned Discharge Destination: Home with Home Health    Time spent: 29 minutes  Author: Loletha Grayer, MD 08/30/2022 12:34 PM  For on call review [email or web address].

## 2022-08-30 NOTE — Discharge Instructions (Signed)

## 2022-08-30 NOTE — ED Notes (Signed)
Pt back to room from IR and placed on the cardiac monitor.

## 2022-08-30 NOTE — Assessment & Plan Note (Signed)
Wounds of bilateral feet top of the foot and medial right ankle.  See pictures below, present on admission.  Appreciate wound care consultation.  Check CRP and ESR.

## 2022-08-30 NOTE — ED Notes (Signed)
Patient placed in brief, linen changed, patient with external catheter in place. Patient denies needs at this time. Patient resting comfortably, NAD.

## 2022-08-30 NOTE — Evaluation (Signed)
Clinical/Bedside Swallow Evaluation Patient Details  Name: Louis Sanchez MRN: [id number] Date of Birth: 01-12-1941  Today's Date: 08/30/2022 Time: SLP Start Time (ACUTE ONLY): 1400 SLP Stop Time (ACUTE ONLY): 1450 SLP Time Calculation (min) (ACUTE ONLY): 50 min  Past Medical History:  Past Medical History:  Diagnosis Date   Anxiety    Arrhythmia    atrial fibrillation   Arthritis    Benign neoplasm of colon 2004   Benign neoplasm of colon 04/04/2008   68mm tubular adenoma of the ascending colon   Cancer ([place])    liver and lungs   CHF (congestive heart failure) (HCC)    GERD (gastroesophageal reflux disease)    Heart disease    Hypertension    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[place])    Other and unspecified hyperlipidemia 1997   Other specified disorder of male genital organs(608.89) 08/2012   Sleep disturbance, unspecified    Thyroid disease    Unspecified hypothyroidism 2005   Past Surgical History:  Past Surgical History:  Procedure Laterality Date   APPENDECTOMY  1953   colon polyps  2009   HERNIA REPAIR  1957   Right   INGUINAL HERNIA REPAIR Right 2014   SPINE SURGERY  1997   neck   WRIST SURGERY     HPI:  Pt is an 82 year old male past medical history of GERD, HTN, CHF, 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     Patient presented to the emergency room on 2/5 from his assisted living facility with increasing dyspnea on exertion.   Chest CT: "Chronic lung issues including chronic loculated pleural fluid in the RIGHT lower lobe with thickened rim and postsurgical changes from RIGHT lower lobectomy with volume loss in RIGHT hemithorax"; Left pleural effusion; new consolidation in the RIGHT lower lobe is  concerning for  pneumonia or aspiration pneumonitis; Anasarca of the soft tissues suggest volume overload. Markedly  enlarged cardiac atria.    Assessment / Plan / Recommendation  Clinical Impression   Pt seen for BSE today. Pt awake sitting up in bed but benefited from better forward support w/ a Pillow Low behind back(adjusted by this SLP). Pt was eating some of the last of his lunch meal. RT present to change out pulse ox and adjust O2. Pt verbal and followed all commands. Pt denied any difficulty swallowing; just some difficulty chewing "tough" foods. He endorsed ease of SOB w/ any exertion. On [place] O2 4L; WBC WNL; afebrile.  Pt appears to present w/ functional oropharyngeal phase swallowing w/ No overt oropharyngeal phase dysphagia noted, No neuromuscular deficits noted. Pt consumed po trials w/ No immediate, overt clinical s/s of aspiration during po intake. Pt appears at reduced risk for aspiration following general aspiration precautions and utilizing Rest Breaks if feeling any increased SOB/WOB during meals.  However, pt does have challenging factors that could impact oropharyngeal swallowing to include Pain/discomfort of feet(NSG aware), generalized weakness, GERD, and Chronic cardiopulmonary deficits including R lower lobectomy. These factors can increase risk for aspiration, dysphagia as well as decreased oral intake overall. ANY Esophageal phase Dysmotility can impact pharyngeal phase of swallowing and can increase risk for aspiration of the Reflux material during Retrograde flow thus impact Voicing and Pulmonary status.  During po trials, pt consumed all consistencies w/  No overt coughing, decline in vocal quality, or change in respiratory presentation during/post trials. O2 sats remained 99-100%. Oral phase appeared grossly [facility] w/ timely bolus management, mastication, and control of bolus propulsion for A-P transfer for swallowing. Oral clearing achieved w/ all trial consistencies --  moistened, soft foods given. Education given on moistening foods for ease of mastication and conservation of energy.  OM Exam appeared [facility] w/ no unilateral weakness noted. Speech Clear. Pt fed self w/ min setup support.  Recommend continue current diet of Mech soft/Regular consistency diet w/ well-Cut meats, moistened foods; Thin liquids -- carefully monitor drinking utilizing small sips, slowly. Recommend general aspiration precautions including Rest Breaks for conservation of energy. GERD/REFLUX precautions baseline. Pills WHOLE in Puree for safer, easier swallowing -- pt has done this at his residence([facility]) and was encouraged to do so now and for D/C as needed.  Education given on Pills in Puree; food consistencies and easy to eat options; general aspiration precautions to pt and NSG. MD/NSG to reconsult if any new needs arise. NSG updated, agreed. MD updated -- PPI requested in setting of pt's baseline of GERD and current [place] w/ oral intake. Recommended Dietician f/u for support. SLP Visit Diagnosis: Dysphagia, unspecified (R13.10)    Aspiration Risk   (reduced following general precautions)    Diet Recommendation   continue current diet of Mech soft/Regular consistency diet w/ well-Cut meats, moistened foods; Thin liquids -- carefully monitor drinking utilizing small sips, slowly. Recommend general aspiration precautions including Rest Breaks for conservation of energy. Choose easy to eat foods. GERD/REFLUX precautions baseline.   Medication Administration: Whole meds with puree (as needed for ease of swallowing)    Other  Recommendations Recommended Consults: Consider GI evaluation (Dietician f/u) Oral Care Recommendations: Oral care BID;Oral care before and after PO;Patient independent with oral care (setup) Caregiver Recommendations:  (no caregiver present)    Recommendations for follow up therapy are one component of a multi-disciplinary discharge planning process, led by the  attending physician.  Recommendations may be updated based on patient status, additional functional criteria and insurance authorization.  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     Patient presented to the emergency room on 2/5 from his assisted living facility with increasing dyspnea on exertion.   Chest CT: "Chronic lung issues including chronic loculated pleural fluid in the RIGHT lower lobe with thickened rim and postsurgical changes from RIGHT lower lobectomy with volume loss in RIGHT hemithorax"; Left pleural effusion; new consolidation in the RIGHT lower lobe is concerning for  pneumonia or aspiration pneumonitis; Anasarca of the soft tissues suggest volume overload. Markedly  enlarged cardiac atria. Type of Study: Bedside Swallow Evaluation Previous Swallow Assessment: none Diet Prior  to this Study: Dysphagia 3 (mechanical soft);Thin liquids (Level 0) Temperature Spikes Noted: No (WBC 7.5.8) Respiratory Status: Nasal cannula (4L) History of Recent Intubation: No Behavior/Cognition: Alert;Cooperative;Pleasant mood Oral Cavity Assessment: Within Functional Limits Oral Care  Completed by SLP: Recent completion by staff Oral Cavity - Dentition: Adequate natural dentition Vision: Functional for self-feeding Self-Feeding Abilities: Able to feed self;Needs set up (min) Patient Positioning: Upright in bed (supported low behind back for upright sitting) Baseline Vocal Quality: Normal Volitional Cough: Strong Volitional Swallow: Able to elicit    Oral/Motor/Sensory Function Overall Oral Motor/Sensory Function: Within functional limits   Ice Chips Ice chips: Not tested   Thin Liquid Thin Liquid: Within functional limits Presentation: Cup;Self Fed;Straw (12+ trials w/ 3 via cup) Other Comments: encouraged pt to take his time; use small sips    Nectar Thick Nectar Thick Liquid: Not tested   Honey Thick Honey Thick Liquid: Not tested   Puree Puree: Within functional limits Presentation: Self Fed;Spoon (6+ trials)   Solid     Solid: Within functional limits (moistened well especially if a tougher food) Presentation: Self Fed;Spoon (5 trials) Other Comments: educated on moistened foods well       Orinda Kenner, [place], [place]; [place] [phone] (ascom) Peyton Rossner 08/30/2022,3:15 PM

## 2022-08-30 NOTE — Assessment & Plan Note (Signed)
Replaced 

## 2022-08-30 NOTE — Assessment & Plan Note (Signed)
Ultrasound to see if enough fluid to drop off.

## 2022-08-30 NOTE — Consult Note (Signed)
[place] for Xarelto Indication: atrial fibrillation  Patient Measurements: Weight: 73.5 kg (162 lb 0.6 oz)  Labs: Recent Labs    08/29/22 1316 08/30/22 0505  HGB 11.4* 9.9*  HCT 37.5* 32.8*  PLT 194 159  CREATININE 1.64* 1.56*     Estimated Creatinine Clearance: 34.1 mL/min (A) (by C-G formula based on SCr of 1.56 mg/dL (H)).   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 Pharmacy consulted for Xarelto dosing for Afib.  CrCl w/TBW (Age 48, Scr 1.56, Wt 73.5 kg): 39 mL/min  Plan:  --Xarelto 15 mg once daily with evening meal for calculated Crcl 39 ml/min  (Renal adjustment for Crcl of 15-50 ml/min (using Total Body weight)  in Afib)  --Follow-up renal function  --Noted history of cirrhosis; Xarelto not recommended in Child-Pugh class B / C --CBC per protocol  Pearse Shiffler A 08/30/2022,7:20 AM

## 2022-08-30 NOTE — ED Notes (Signed)
Dr. Leslye Peer notified of the pts hypotension, waiting to hear back. Pt is resting comfortably, denies dizziness and states "I just feel tired'. All other vitals are stable.

## 2022-08-30 NOTE — Assessment & Plan Note (Signed)
Last ferritin 47, last hemoglobin 9.9

## 2022-08-30 NOTE — ED Notes (Signed)
Pt eating lunch at this time resting comfortably

## 2022-08-30 NOTE — Consult Note (Addendum)
[facility] Nurse Consult Note: Reason for Consult: Consult requested for bilat foot wounds.  Performed remotely after review of progress notes and photos in the EMR.   Pt is familiar to [facility] team from recent admission on 1/14.Pt has several full thickness wounds to bilat anterior feet and ankle/heel which are pale red and moist, ankle with white macerated skin surrounding. Anterior feet with dry yellow scabbed skin surrounding the wounds. Topical treatment orders provided for bedside nurses to perform as follows to absorb drainage and promote healing: Cleanse bilat foot wounds with NS, then top with a piece of calcium alginate Kellie Simmering # [phone]) and cover with an ABD pads and Kerlix. Please re-consult if further assistance is needed.  Thank-you,  Julien Girt MSN, [place], [place], [place], [place]

## 2022-08-31 DIAGNOSIS — A419 Sepsis, unspecified organism: Secondary | ICD-10-CM | POA: Diagnosis not present

## 2022-08-31 DIAGNOSIS — R6520 Severe sepsis without septic shock: Secondary | ICD-10-CM | POA: Diagnosis not present

## 2022-08-31 LAB — BASIC METABOLIC PANEL
Anion gap: 8 (ref 5–15)
BUN: 38 mg/dL — ABNORMAL HIGH (ref 8–23)
CO2: 29 mmol/L (ref 22–32)
Calcium: 7.9 mg/dL — ABNORMAL LOW (ref 8.9–10.3)
Chloride: 100 mmol/L (ref 98–111)
Creatinine, Ser: 1.41 mg/dL — ABNORMAL HIGH (ref 0.61–1.24)
GFR, Estimated: 50 mL/min — ABNORMAL LOW (ref >60)
Glucose, Bld: 122 mg/dL — ABNORMAL HIGH (ref 70–99)
Potassium: 3.7 mmol/L (ref 3.5–5.1)
Sodium: 137 mmol/L (ref 135–145)

## 2022-08-31 LAB — CBC
HCT: 32.8 % — ABNORMAL LOW (ref 39.0–52.0)
Hemoglobin: 10.6 g/dL — ABNORMAL LOW (ref 13.0–17.0)
MCH: 30.1 pg (ref 26.0–34.0)
MCHC: 32.3 g/dL (ref 30.0–36.0)
MCV: 93.2 fL (ref 80.0–100.0)
Platelets: 153 10*3/uL (ref 150–400)
RBC: 3.52 MIL/uL — ABNORMAL LOW (ref 4.22–5.81)
RDW: 19.8 % — ABNORMAL HIGH (ref 11.5–15.5)
WBC: 9.2 10*3/uL (ref 4.0–10.5)
nRBC: 0 % (ref 0.0–0.2)

## 2022-08-31 LAB — SEDIMENTATION RATE: Sed Rate: 2 mm/hr (ref 0–20)

## 2022-08-31 LAB — C-REACTIVE PROTEIN: CRP: 3.3 mg/dL — ABNORMAL HIGH (ref <1.0)

## 2022-08-31 MED ORDER — GABAPENTIN 300 MG PO CAPS
300.0000 mg | ORAL_CAPSULE | Freq: Three times a day (TID) | ORAL | Status: DC
  Administered 2022-08-31 – 2022-09-03 (×11): 300 mg via ORAL
  Filled 2022-08-31 (×11): qty 1

## 2022-08-31 MED ORDER — SPIRONOLACTONE 25 MG PO TABS
25.0000 mg | ORAL_TABLET | Freq: Every day | ORAL | Status: DC
  Administered 2022-08-31 – 2022-09-03 (×4): 25 mg via ORAL
  Filled 2022-08-31 (×4): qty 1

## 2022-08-31 MED ORDER — METOPROLOL SUCCINATE ER 25 MG PO TB24: 12.5000 mg | ORAL_TABLET | Freq: Every day | ORAL | Status: DC

## 2022-08-31 NOTE — Evaluation (Signed)
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  Patient now presents to the emergency room on 2/5 from his assisted living facility with increasing dyspnea on exertion, PNA.  Clinical Impression  Pt is pleasant and motivated t/o session, did have some minimal confusion.  He reports he has but does not use AD and is able to ambulate around the facility ad lib, however also endorses multiple recent falls and certainly displayed consistent unsteadiness with in-room ambulation w/o AD, needing multiple times with minA to insure balance (no LOBs with RW, though still choppy and unsteady).  Pt with SpO2 in the high 90s on room air at rest, attempted 2L but SpO2 dropped to mid 80s with bed mobility, later attempted ambulation on room air with similar drop at 60 ft.  Pt educated that he should be using AD at this point due to unsteadiness/balance issues, he does verbalize agreement.  Pt will benefit from continued PT to address functional limitations and work on increasing activity tolerance and safety.     Recommendations for follow up therapy are one component of a multi-disciplinary discharge planning process, led by the attending physician.  Recommendations may be updated based on patient status, additional functional criteria and insurance authorization.  Follow Up Recommendations  (PT at his ALF)      Assistance Recommended at Discharge Intermittent Supervision/Assistance  Patient can return home with the following  A little help with walking and/or transfers;A little help with  bathing/dressing/bathroom;Assist for transportation;Assistance with cooking/housework    Equipment Recommendations None recommended by PT (has walker and cane, just needs to actually use)  Recommendations for Other Services       Functional Status Assessment Patient has had a recent decline in their functional status and demonstrates the ability to make significant improvements in function in a reasonable and predictable amount of time.     Precautions / Restrictions Precautions Precautions: Fall Restrictions Weight Bearing Restrictions: No      Mobility  Bed Mobility Overal bed mobility: Needs Assistance Bed Mobility: Supine to Sit     Supine to sit: Min assist     General bed mobility comments: Pt able to get himself to upright at EOB, but needed assist to scoot forward to get feet to the floor.    Transfers Overall transfer level: Modified independent Equipment used: Rolling walker (2 wheels), None               General transfer comment: Pt was highly reliant on UEs to get to standing, needing cues for appropriate set up and use.    Ambulation/Gait Ambulation/Gait assistance: Min assist, Mod assist Gait Distance (Feet): 60 Feet Assistive device: Rolling walker (2 wheels), 1 person hand held assist, None         General Gait Details: 60 ft with walker, 60 ft w/o AD, 60 ft with light HHA (no O2) - seated rest breaks between.  Pt displayed choppy, halting steps with relatively consistent stagger stepping and occasional LOBs backward needing direct assist to maintain standing.  He reports that he does consistently have some staggering but does not like to use his cane or walker; seems to acknowledge  that he may need it with today's effort and  education/explanation from PT.  Pt did fatigue with each effort though O2 stayed in the 90s on 4L, (dropped to 85% during one attempt on room air).  Ultimately showed good (if misplaced) confidence - safer, though still with  unsteadiness, using walker.  Stairs            Wheelchair Mobility    Modified Rankin (Stroke Patients Only)       Balance Overall balance assessment: Needs assistance Sitting-balance support: Single extremity supported Sitting balance-Leahy Scale: Fair     Standing balance support: During functional activity Standing balance-Leahy Scale: Fair Standing balance comment: Pt clearly not used to using walker but did not need any direct assist to maintain standing while using it.  Did have consistent stagger stepping/backward LOBs while doing any standing activity w/o ADs.                             Pertinent Vitals/Pain Pain Assessment Pain Assessment: No/denies pain    Home Living Family/patient expects to be discharged to:: Assisted living Living Arrangements: Spouse/significant other (is caregiver for wife)               Home Equipment: Conservation officer, nature (2 wheels);Rollator (4 wheels);Shower seat;Grab bars - tub/shower;Grab bars - toilet      Prior Function Prior Level of Function : Needs assist;Driving;History of Falls (last six months)             Mobility Comments: Patient reports independence with mobility, no AD - though he does endorse multiple recent falls ADLs Comments: Patient able to complete basic ADLs independently.  He receives assist from ALF for meds, meals, laundry/cleaning.     Hand Dominance        Extremity/Trunk Assessment   Upper Extremity Assessment Upper Extremity Assessment: Overall WFL for tasks assessed;Generalized weakness    Lower Extremity Assessment Lower Extremity Assessment: Overall WFL for tasks assessed;Generalized weakness       Communication   Communication: No difficulties  Cognition Arousal/Alertness: Awake/alert Behavior During Therapy: WFL for tasks assessed/performed Overall Cognitive Status: No family/caregiver present to determine baseline cognitive functioning                                  General Comments: Pt pleasant and interactive, showed good situational awareness. Knew year but not day or month.        General Comments General comments (skin integrity, edema, etc.): Pt's SpO2 99% on 4L on arrival, maintained 90s on 2L at rest but dropping to mid 80s during effortful transition to sitting, returned to and maintained 4L t/o most of session with SpO2 in the mid 90s.  Trial of ambulation on room air (60 ft) revealed steady drop in O2 down to 85%.    Exercises     Assessment/Plan    PT Assessment Patient needs continued PT services  PT Problem List Decreased activity tolerance;Decreased strength;Decreased range of motion;Decreased balance;Decreased mobility;Decreased knowledge of use of DME;Decreased safety awareness;Cardiopulmonary status limiting activity       PT Treatment Interventions DME instruction;Gait training;Functional mobility training;Therapeutic activities;Therapeutic exercise;Balance training;Patient/family education    PT Goals (Current goals can be found in the Care Plan section)  Acute Rehab PT Goals Patient Stated Goal: Get breathing better PT Goal Formulation: With patient Time For Goal Achievement: 09/13/22 Potential to Achieve Goals: Good  Frequency Min 2X/week     Co-evaluation               AM-PAC PT "6 Clicks" Mobility  Outcome Measure Help needed turning from your back to your side while in a flat bed without using bedrails?: A Little Help needed moving from lying on your back to sitting on the side of a flat bed without using bedrails?: A Little Help needed moving to and from a bed to a chair (including a wheelchair)?: A Little Help needed standing up from a chair using your arms (e.g., wheelchair or bedside chair)?: A Little Help needed to walk in hospital room?: A Little Help needed climbing 3-5 steps with a railing? : A Lot 6 Click Score: 17    End of Session Equipment Utilized During Treatment: Gait  belt;Oxygen Activity Tolerance: Patient limited by fatigue Patient left: with chair alarm set;with call bell/phone within reach Nurse Communication: Mobility status PT Visit Diagnosis: Muscle weakness (generalized) (M62.81);Difficulty in walking, not elsewhere classified (R26.2);Unsteadiness on feet (R26.81)    Time: 3567-0141 PT Time Calculation (min) (ACUTE ONLY): 38 min   Charges:   PT Evaluation $PT Eval Moderate Complexity: 1 Mod PT Treatments $Gait Training: 8-22 mins        Kreg Shropshire, DPT 08/31/2022, 2:05 PM

## 2022-08-31 NOTE — Progress Notes (Signed)
Pharmacy Antibiotic Note  Louis Sanchez is a 82 y.o. male with PMH including neuroendocrine tumor with lung metastases, lung cancer s/p right lower lobectomy, neuropathy, liver cirrhosis, Afib on Xarelto, hypothyroidism admitted on 08/29/2022 with  shortness of breath / falls .  Pharmacy has been consulted for Unasyn dosing for aspiration pneumonia.  Plan: Unasyn 3 g IV q6h  Height: 5\' 6"  (167.6 cm) Weight: 73.5 kg (162 lb) IBW/kg (Calculated) : 63.8  Temp (24hrs), Avg:98 F (36.7 C), Min:97.1 F (36.2 C), Max:98.5 F (36.9 C)  Recent Labs  Lab 08/29/22 1316 08/29/22 1530 08/29/22 1829 08/29/22 2048 08/30/22 0505 08/31/22 0508  WBC 7.0  --   --   --  5.8 9.2  CREATININE 1.64*  --   --   --  1.56* 1.41*  LATICACIDVEN  --  2.4* 2.1* 1.6  1.7 1.5  --      Estimated Creatinine Clearance: 37.7 mL/min (A) (by C-G formula based on SCr of 1.41 mg/dL (H)).    Allergies  Allergen Reactions   Atorvastatin     Other reaction(s): Muscle Pain Other reaction(s): Muscle Pain Other reaction(s): Muscle Pain Other reaction(s): Muscle Pain Other reaction(s): Muscle Pain    Levofloxacin Rash    Antimicrobials this admission: Unasyn 2/5 >>   Dose adjustments this admission: N/A  Microbiology results: 2/5 BCx: NG x 1 day 2/5 resp panel : negative  Thank you for allowing pharmacy to be a part of this patient's care.  Micaylah Bertucci Rodriguez-Guzman PharmD, BCPS 08/31/2022 8:42 AM

## 2022-08-31 NOTE — Progress Notes (Signed)
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   Patient presented to the emergency room on 2/5 from his assisted living facility with increasing dyspnea on exertion.  In the emergency room, patient found to have a BNP of almost 1500, acute kidney injury with a creatinine of 1.6, initial hypoxia requiring 6 L and lactic acid level of 2.4.  CT scan of the chest which noted new consolidation right lower lobe concerning for pneumonia versus aspiration pneumonitis as well as CHF.  Patient given IV antibiotics and Lasix and admitted to the hospitalist service for severe sepsis secondary to aspiration pneumonia as well as acute on chronic diastolic heart failure.   2/6.  Patient have right-sided pneumonia and right pleural effusion.  Spoke with patient about possible thoracentesis.  Patient also being treated for congestive heart failure with IV Lasix.  Patient on oxygen this morning and normally does not wear oxygen.  Patient has chronic wounds going on for years.  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  Respiratory panel negative.  On empiric Unasyn.  So far blood cultures are negative. Plan: Continue Unasyn Monitor vitals and fever curve   Aspiration pneumonia ([place]) Started on Unasyn.  Has chronic right pleural effusion.  Per ultrasound there is an insufficient fluid to tap   Acute respiratory failure with hypoxia (HCC) Initially required 6 L and pulse ox was 91%.  Patient tapered down to 2 L.  Continue weaning down as tolerated  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 heart failure) (HCC) Continue Lasix 40 IV twice daily today.  Monitor kidney function closely.  Consider restarting home reticulocyte   AKI (acute kidney injury) ([place]) Watch closely with diuresis.   Chronic atrial fibrillation (HCC) Remains rate controlled.  Still in atrial fibrillation.  Renally dose adjust Xarelto.   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) Continue outpatient follow-up with oncology   Anemia of chronic disease Last ferritin 47, last hemoglobin 9.9   Essential hypertension Holding antihypertensive medications except for Lasix.   Hypothyroidism Continue Synthroid   Hypokalemia Replace potassium orally   Wound of foot Wounds of bilateral feet top of the foot and medial right ankle.  See pictures below, present on admission.  Appreciate wound care consultation.    Pleural effusion on right Chronic.  Insufficient fluid for tap   DVT prophylaxis: Xarelto Code Status: Full Family Communication: None Disposition Plan: Status is: Inpatient Remains inpatient appropriate because: Sepsis/pneumonia on IV antibiotics   Level of care: Progressive  Consultants:  None  Procedures:  None  Antimicrobials: Unasyn   Subjective: Seen and examined.  Resting in bed.  Weak voice but otherwise asymptomatic.  Objective: Vitals:   08/31/22 0025 08/31/22 0228 08/31/22 0542  08/31/22 0801  BP: 107/68  (!) 97/58 99/60  Pulse: (!) 110  75 81  Resp: 17  17 14   Temp: 98.1 F (36.7 C)  98.4 F (36.9 C) 98.5 F (36.9 C)  TempSrc:      SpO2: 92% 97% 95% 100%  Weight:      Height:        Intake/Output Summary (Last 24 hours) at 08/31/2022 1146 Last data filed at 08/31/2022 0300 Gross per 24 hour  Intake 503.33 ml  Output 350 ml  Net 153.33 ml   Filed Weights   08/29/22 2026 08/30/22 0734  Weight: 73.5 kg 73.5 kg    Examination:  General exam: NAD.  Frail-appearing Respiratory system: Scattered crackles bilaterally.  Normal work of breathing.  2 L Cardiovascular system: S1-S2, RRR, no murmurs, no pedal edema Gastrointestinal system: Soft, NT/ND, normal bowel sounds Central nervous system: Alert and oriented. No focal neurological deficits. Extremities: Symmetric 5 x 5 power. Skin: No rashes, lesions or ulcers Psychiatry: Judgement and insight appear normal. Mood & affect appropriate.     Data Reviewed: I have personally reviewed following labs and imaging studies  CBC: Recent Labs  Lab 08/29/22 1316 08/30/22 0505 08/31/22 0508  WBC 7.0 5.8 9.2  NEUTROABS 6.1  --   --   HGB 11.4* 9.9* 10.6*  HCT 37.5* 32.8* 32.8*  MCV 95.2 95.6 93.2  PLT 194 159 323   Basic Metabolic Panel: Recent Labs  Lab 08/29/22 1316 08/30/22 0505 08/31/22 0508  NA 134* 138 137  K 3.6 3.0* 3.7  CL 93* 100 100  CO2 25 30 29   GLUCOSE 148* 94 122*  BUN 47* 43* 38*  CREATININE 1.64* 1.56* 1.41*  CALCIUM 8.6* 8.0* 7.9*   GFR: Estimated Creatinine Clearance: 37.7 mL/min (A) (by C-G formula based on SCr of 1.41 mg/dL (H)). Liver Function Tests: Recent Labs  Lab 08/29/22 1316  AST 150*  ALT 87*  ALKPHOS 184*  BILITOT 1.7*  PROT 7.3  ALBUMIN 3.1*   No results for input(s): "LIPASE", "AMYLASE" in the last 168 hours. No results for input(s): "AMMONIA" in the last 168 hours. Coagulation Profile: No results for input(s): "INR", "PROTIME" in the last 168  hours. Cardiac Enzymes: No results for input(s): "CKTOTAL", "CKMB", "CKMBINDEX", "TROPONINI" in the last 168 hours. BNP (last 3 results) No results for input(s): "PROBNP" in the last 8760 hours. HbA1C: No results for input(s): "HGBA1C" in the last 72 hours. CBG: No results for input(s): "GLUCAP" in the last 168 hours. Lipid Profile: No results for input(s): "CHOL", "HDL", "LDLCALC", "TRIG", "CHOLHDL", "LDLDIRECT" in the last 72 hours. Thyroid Function Tests: No results for input(s): "TSH", "T4TOTAL", "FREET4", "T3FREE", "THYROIDAB" in the last 72 hours. Anemia Panel: No results for input(s): "VITAMINB12", "FOLATE", "FERRITIN", "TIBC", "IRON", "RETICCTPCT" in the last 72 hours. Sepsis Labs: Recent Labs  Lab 08/29/22 1530 08/29/22 1829 08/29/22 2048 08/30/22 0505  PROCALCITON  --   --  0.58  --   LATICACIDVEN 2.4* 2.1* 1.6  1.7 1.5    Recent Results (from the past 240 hour(s))  Blood Culture (routine x 2)     Status: None (Preliminary result)   Collection Time: 08/29/22  3:30 PM   Specimen: BLOOD  Result Value Ref Range Status   Specimen Description BLOOD BLOOD LEFT ARM  Final  Special Requests   Final    BOTTLES DRAWN AEROBIC AND ANAEROBIC Blood Culture results may not be optimal due to an excessive volume of blood received in culture bottles   Culture   Final    NO GROWTH < 24 HOURS Performed at [facility], [address] [place], [place] [address]    Report Status PENDING  Incomplete  Blood Culture (routine x 2)     Status: None (Preliminary result)   Collection Time: 08/29/22  3:45 PM   Specimen: BLOOD  Result Value Ref Range Status   Specimen Description BLOOD BLOOD LEFT FOREARM  Final   Special Requests   Final    BOTTLES DRAWN AEROBIC AND ANAEROBIC Blood Culture results may not be optimal due to an excessive volume of blood received in culture bottles   Culture   Final    NO GROWTH < 24 HOURS Performed at [facility], [address] [place], [place] [address]    Report Status PENDING  Incomplete  Respiratory (~20 pathogens) panel by PCR     Status: None   Collection Time: 08/29/22  8:48 PM   Specimen: Nasopharyngeal Swab; Respiratory  Result Value Ref Range Status   Adenovirus NOT DETECTED NOT DETECTED Final   Coronavirus 229E NOT DETECTED NOT DETECTED Final    Comment: (NOTE) The Coronavirus on the Respiratory Panel, DOES NOT test for the novel  Coronavirus (2019 nCoV)    Coronavirus HKU1 NOT DETECTED NOT DETECTED Final   Coronavirus NL63 NOT DETECTED NOT DETECTED Final   Coronavirus OC43 NOT DETECTED NOT DETECTED Final   Metapneumovirus NOT DETECTED NOT DETECTED Final   Rhinovirus / Enterovirus NOT DETECTED NOT DETECTED Final   Influenza A NOT DETECTED NOT DETECTED Final   Influenza B NOT DETECTED NOT DETECTED Final   Parainfluenza Virus 1 NOT DETECTED NOT DETECTED Final   Parainfluenza Virus 2 NOT DETECTED NOT DETECTED Final   Parainfluenza Virus 3 NOT DETECTED NOT DETECTED Final   Parainfluenza Virus 4 NOT DETECTED NOT DETECTED Final   Respiratory Syncytial Virus NOT DETECTED NOT DETECTED Final   Bordetella pertussis NOT DETECTED NOT DETECTED Final   Bordetella Parapertussis NOT DETECTED NOT DETECTED Final   Chlamydophila pneumoniae NOT DETECTED NOT DETECTED Final   Mycoplasma pneumoniae NOT DETECTED NOT DETECTED Final    Comment: Performed at [facility], [address], [place], [place] [address]         Radiology Studies: [place] CHEST (PLEURAL EFFUSION)  Result Date: 08/30/2022 CLINICAL DATA:  Right pleural effusion EXAM: CHEST ULTRASOUND COMPARISON:  None Available. FINDINGS: Focused sonographic exam of the right chest was performed for fluid assessment. Images demonstrate small amount of heterogeneous fluid in the right posterior pleural space, not amenable to image guided thoracentesis. IMPRESSION: Small amount of heterogeneous fluid in the right posterior pleural space, not amenable for image  guided thoracentesis. No intervention performed. This fluid has been described as a chronic loculated effusion on recent cross-sectional imaging. Electronically Signed   By: Albin Felling M.D.   On: 08/30/2022 12:41   CT CHEST WO CONTRAST  Result Date: 08/29/2022 CLINICAL DATA:  Blunt trauma.  Multiple falls. EXAM: CT CHEST WITHOUT CONTRAST TECHNIQUE: Multidetector CT imaging of the chest was performed following the standard protocol without IV contrast. RADIATION DOSE REDUCTION: This exam was performed according to the departmental dose-optimization program which includes automated exposure control, adjustment of the mA and/or kV according to patient size and/or use of iterative reconstruction technique. COMPARISON:  CT chest 12/02/2021 FINDINGS: Cardiovascular: Heart is enlarged. The LEFT and RIGHT atrium are markedly enlarged similar to prior. No pericardial fluid. Coronary calcifications present. Mediastinum/Nodes: No mediastinal hilar adenopathy. Multiple small RIGHT supraclavicular nodes. Lungs/Pleura: Chronic loculated pleural fluid in the RIGHT lower lobe with thickened rim. Findings unchanged from prior. There is consolidation in the RIGHT lower lobe (image 99/4) which is new from prior. Small LEFT effusion.  No pneumothorax. Upper Abdomen: Limited view of the liver, kidneys, pancreas are unremarkable. Normal adrenal glands. Small amount of intraperitoneal free fluid along the margin liver which simple fluid attenuation. Musculoskeletal: No evidence of rib fracture scapular fracture. No sternal fracture IMPRESSION: 1. Chronic loculated RIGHT pleural effusion. 2. New consolidation in the RIGHT lower lobe is concerning for pneumonia or aspiration pneumonitis. 3. No evidence of thoracic trauma.  No fracture or pneumothorax. 4. Small amount of free fluid in the upper abdomen. 5. Anasarca of the soft tissues suggest volume overload. Markedly enlarged cardiac atria. 6. Small RIGHT supraclavicular nodes are  favored reactive. Electronically Signed   By: Suzy Bouchard M.D.   On: 08/29/2022 14:50   CT HEAD WO CONTRAST (5MM)  Result Date: 08/29/2022 CLINICAL DATA:  Multiple falls since Friday. Right chest bruising. Short of breath. Slurred speech. EXAM: CT HEAD WITHOUT CONTRAST CT CERVICAL SPINE WITHOUT CONTRAST TECHNIQUE: Multidetector CT imaging of the head and cervical spine was performed following the standard protocol without intravenous contrast. Multiplanar CT image reconstructions of the cervical spine were also generated. RADIATION DOSE REDUCTION: This exam was performed according to the departmental dose-optimization program which includes automated exposure control, adjustment of the mA and/or kV according to patient size and/or use of iterative reconstruction technique. COMPARISON:  10/02/2021 FINDINGS: CT HEAD FINDINGS Brain: No evidence of acute infarction, hemorrhage, hydrocephalus, extra-axial collection or mass lesion/mass effect. Vascular: No hyperdense vessel or unexpected calcification. Skull: Normal. Negative for fracture or focal lesion. Sinuses/Orbits: Globes and orbits are unremarkable. Visualized sinuses are clear. Other: None. CT CERVICAL SPINE FINDINGS Alignment: Normal. Skull base and vertebrae: No acute fracture. No primary bone lesion or focal pathologic process. Soft tissues and spinal canal: No prevertebral fluid or swelling. No visible canal hematoma. Disc levels: Status post anterior cervical fusion at C6-C7. Screws and anterior fixation plate are well seated and unchanged. There is mature bone graft material extending across the disc interspace. Mild loss of disc height at C2-C3. Moderate loss of disc height from C3-C4 through C6-C7. Disc bulging and endplate spurring at these levels. No convincing disc herniation. Facet degenerative change most evident on the left at C3-C4. Upper chest: Right upper chest soft tissue edema. Other: None. IMPRESSION: HEAD CT 1. No acute intracranial  abnormalities. CERVICAL CT 1. No fracture or acute skeletal abnormality. 2. Right neck base soft tissue edema. Please refer to the current chest CT. Electronically Signed   By: Lajean Manes M.D.   On: 08/29/2022 14:48   CT Cervical Spine Wo Contrast  Result Date: 08/29/2022 CLINICAL DATA:  Multiple falls since Friday. Right chest bruising. Short of breath. Slurred speech. EXAM: CT HEAD WITHOUT CONTRAST CT CERVICAL SPINE WITHOUT CONTRAST TECHNIQUE: Multidetector CT imaging of the head and cervical spine was performed following the standard protocol without intravenous contrast. Multiplanar CT image reconstructions of the cervical spine were also generated. RADIATION DOSE REDUCTION: This exam was performed according to the departmental dose-optimization program which includes automated exposure control, adjustment of the mA and/or kV according to patient size and/or use of iterative reconstruction technique. COMPARISON:  10/02/2021 FINDINGS: CT HEAD FINDINGS Brain: No evidence of acute infarction, hemorrhage, hydrocephalus, extra-axial collection or mass lesion/mass effect. Vascular: No hyperdense vessel or unexpected calcification. Skull: Normal. Negative for fracture or focal lesion. Sinuses/Orbits: Globes and orbits are unremarkable. Visualized sinuses are clear. Other: None. CT CERVICAL SPINE FINDINGS Alignment: Normal. Skull base and vertebrae: No acute fracture. No primary bone lesion or focal pathologic process. Soft tissues and spinal canal: No prevertebral fluid or swelling. No visible canal hematoma. Disc levels: Status post anterior cervical fusion at C6-C7. Screws and anterior fixation plate are well seated and unchanged. There is mature bone graft material extending across the disc interspace. Mild loss of disc height at C2-C3. Moderate loss of disc height from C3-C4 through C6-C7. Disc bulging and endplate spurring at these levels. No convincing disc herniation. Facet degenerative change most evident  on the left at C3-C4. Upper chest: Right upper chest soft tissue edema. Other: None. IMPRESSION: HEAD CT 1. No acute intracranial abnormalities. CERVICAL CT 1. No fracture or acute skeletal abnormality. 2. Right neck base soft tissue edema. Please refer to the current chest CT. Electronically Signed   By: Lajean Manes M.D.   On: 08/29/2022 14:48   DG Chest Portable 1 View  Result Date: 08/29/2022 CLINICAL DATA:  Fall EXAM: PORTABLE CHEST 1 VIEW COMPARISON:  Chest x-ray dated August 07, 2022 FINDINGS: Unchanged cardiomegaly. Right hemithorax volume loss, similar to prior. Moderate right pleural effusion and right lower lung opacities, unchanged when compared with prior exam. No evidence of pneumothorax. IMPRESSION: Moderate right pleural effusion and right lower lung opacities, unchanged when compared with the prior Electronically Signed   By: Yetta Glassman M.D.   On: 08/29/2022 14:34        Scheduled Meds:  furosemide  40 mg Intravenous BID   gabapentin  300 mg Oral TID   levothyroxine  175 mcg Oral QAC breakfast   midodrine  5 mg Oral TID WC   pantoprazole  40 mg Oral Daily   potassium chloride  40 mEq Oral BID   rivaroxaban  15 mg Oral Q supper   rosuvastatin  10 mg Oral QHS   sertraline  150 mg Oral Daily   spironolactone  25 mg Oral Daily   tiotropium  18 mcg Inhalation Daily   Continuous Infusions:  ampicillin-sulbactam (UNASYN) IV 3 g (08/31/22 0926)     LOS: 2 days      Sidney Ace, MD [facility]   If 7PM-7AM, please contact night-coverage  08/31/2022, 11:46 AM

## 2022-08-31 NOTE — TOC Progression Note (Signed)
Transition of Care [facility]) - Progression Note    Patient Details  Name: RHYDIAN BALDI MRN: [id number] Date of Birth: 1941/01/24  Transition [facility] Care [facility]) CM/SW Contact  Laurena Slimmer, RN Phone Number: 08/31/2022, 1:10 PM  Clinical Narrative:    Case reviewed for needs and disposition. Patient is from [place].        Expected Discharge Plan and Services                                               Social Determinants of Health (SDOH) Interventions SDOH Screenings   Food Insecurity: No Food Insecurity (08/30/2022)  Housing: Low Risk  (08/30/2022)  Transportation Needs: No Transportation Needs (08/30/2022)  Utilities: Not At Risk (08/30/2022)  Tobacco Use: Medium Risk (08/30/2022)    Readmission Risk Interventions     No data to display

## 2022-08-31 NOTE — Plan of Care (Signed)

## 2022-08-31 NOTE — TOC Progression Note (Signed)
Transition of Care [facility]) - Progression Note    Patient Details  Name: Louis Sanchez MRN: [id number] Date of Birth: [birth year]  [facility]) CM/SW Contact  Laurena Slimmer, RN Phone Number: 08/31/2022, 3:08 PM  Clinical Narrative:    Per therapy patient will need PT at discharge. Referral sent to [place] at [facility] as they are contracted with [facility].         Expected Discharge Plan and Services                                               Social Determinants of Health (SDOH) Interventions SDOH Screenings   Food Insecurity: No Food Insecurity (08/30/2022)  Housing: Low Risk  (08/30/2022)  Transportation Needs: No Transportation Needs (08/30/2022)  Utilities: Not At Risk (08/30/2022)  Tobacco Use: Medium Risk (08/30/2022)    Readmission Risk Interventions     No data to display

## 2022-08-31 NOTE — Plan of Care (Signed)

## 2022-09-01 DIAGNOSIS — A419 Sepsis, unspecified organism: Secondary | ICD-10-CM | POA: Diagnosis not present

## 2022-09-01 DIAGNOSIS — R6520 Severe sepsis without septic shock: Secondary | ICD-10-CM | POA: Diagnosis not present

## 2022-09-01 MED ORDER — FUROSEMIDE 40 MG PO TABS
40.0000 mg | ORAL_TABLET | Freq: Two times a day (BID) | ORAL | Status: DC
  Administered 2022-09-02 – 2022-09-03 (×3): 40 mg via ORAL
  Filled 2022-09-01 (×3): qty 1

## 2022-09-01 MED ORDER — FUROSEMIDE 40 MG PO TABS: 40.0000 mg | ORAL_TABLET | Freq: Every day | ORAL | Status: DC

## 2022-09-01 NOTE — Progress Notes (Signed)
   09/01/22 0330  Assess: MEWS Score  Temp 99.9 F (37.7 C)  BP 98/66  MAP (mmHg) 75  Pulse Rate 86  Resp 12  Level of Consciousness Alert  O2 Device HFNC  O2 Flow Rate (L/min) 3 L/min  Assess: MEWS Score  MEWS Temp 0  MEWS Systolic 1  MEWS Pulse 0  MEWS RR 1  MEWS LOC 0  MEWS Score 2  MEWS Score Color Yellow  Assess: if the MEWS score is Yellow or Red  Were vital signs taken at a resting state? Yes  Focused Assessment No change from prior assessment  Does the patient meet 2 or more of the SIRS criteria? No  Does the patient have a confirmed or suspected source of infection? Yes  Provider and Rapid Response Notified? No  MEWS guidelines implemented  No, vital signs rechecked  Notify: Charge Nurse/RN  Name of Charge Nurse/RN Notified Tiffany, RN  Assess: SIRS CRITERIA  SIRS Temperature  0  SIRS Pulse 0  SIRS Respirations  0  SIRS WBC 0  SIRS Score Sum  0

## 2022-09-01 NOTE — TOC Progression Note (Signed)
Transition of Care [facility]) - Progression Note    Patient Details  Name: WORLEY RADERMACHER MRN: [id number] Date of Birth: [birth year]  Transition of Care [facility]) CM/SW [place], [place] Phone Number: 09/01/2022, 9:31 AM  Clinical Narrative:     CSW notes patient potential for dc tomorrow back to [facility]. CSW has updated [place] with [place] and called [place] at [phone] spoke with Merleen Nicely who requests updated MD note be faxed to   Fax: [phone]  She requests at [facility] tomorrow please fax new fl2 and dc summary to fax number above. Also reports to call main number to request transport which they can provide.         Expected Discharge Plan and Services                                               Social Determinants of Health (SDOH) Interventions SDOH Screenings   Food Insecurity: No Food Insecurity (08/30/2022)  Housing: Low Risk  (08/30/2022)  Transportation Needs: No Transportation Needs (08/30/2022)  Utilities: Not At Risk (08/30/2022)  Tobacco Use: Medium Risk (08/30/2022)    Readmission Risk Interventions     No data to display

## 2022-09-01 NOTE — Progress Notes (Signed)
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   Patient presented to the emergency room on 2/5 from his assisted living facility with increasing dyspnea on exertion.  In the emergency room, patient found to have a BNP of almost 1500, acute kidney injury with a creatinine of 1.6, initial hypoxia requiring 6 L and lactic acid level of 2.4.  CT scan of the chest which noted new consolidation right lower lobe concerning for pneumonia versus aspiration pneumonitis as well as CHF.  Patient given IV antibiotics and Lasix and admitted to the hospitalist service for severe sepsis secondary to aspiration pneumonia as well as acute on chronic diastolic heart failure.   2/6.  Patient have right-sided pneumonia and right pleural effusion.  Spoke with patient about possible thoracentesis.  Patient also being treated for congestive heart failure with IV Lasix.  Patient on oxygen this morning and normally does not wear oxygen.  Patient has chronic wounds going on for years.  2/8: No fluid pocket amenable to thoracentesis.  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  Respiratory panel negative.  On empiric Unasyn.  So far blood cultures are negative. Plan: Continue Unasyn for now Anticipate transition to p.o. Augmentin tomorrow Monitor vitals and fever curve   Aspiration pneumonia ([place]) Started on Unasyn.  Has chronic right pleural effusion.  Per ultrasound there is an insufficient fluid to tap   Acute respiratory failure with hypoxia (HCC) Initially required 6 L and pulse ox was 91%.  Patient tapered down to 2 L.  Continue weaning down as tolerated  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 heart failure) ([place]) Status improved.  Discontinue IV Lasix.  Start p.o. Lasix 40 mg twice daily tomorrow   AKI (acute kidney injury) ([place]) Watch closely with diuresis.   Chronic atrial fibrillation (HCC) Remains rate controlled.  Still in atrial fibrillation.  Renally dose adjust Xarelto.   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   Hypothyroidism Continue Synthroid   Hypokalemia Replace potassium orally   Wound of foot Wounds of bilateral feet top of the foot and medial right ankle.  See pictures below, present on admission.  Appreciate wound care consultation.    Pleural effusion on right Chronic.  Insufficient fluid for tap   DVT prophylaxis: Xarelto Code Status: Full Family Communication: None Disposition Plan: Status is: Inpatient Remains inpatient appropriate  because: Sepsis/pneumonia on IV antibiotics.  Patient remained stable anticipate transition to p.o. antibiotics on discharge 2/9   Level of care: Progressive  Consultants:  None  Procedures:   None  Antimicrobials: Unasyn   Subjective: Seen and examined.  Appears comfortable.  Symptomatically improved.  Objective: Vitals:   09/01/22 0330 09/01/22 0401 09/01/22 0759 09/01/22 0855  BP: 98/66 91/62 104/71   Pulse: 86 82 79   Resp: 12 18 15    Temp: 99.9 F (37.7 C) 98 F (36.7 C) 98.2 F (36.8 C)   TempSrc: Axillary Axillary Oral   SpO2:  100% 100%   Weight:    70.4 kg  Height:        Intake/Output Summary (Last 24 hours) at 09/01/2022 1149 Last data filed at 09/01/2022 1143 Gross per 24 hour  Intake 1030 ml  Output 2550 ml  Net -1520 ml   Filed Weights   08/29/22 2026 08/30/22 0734 09/01/22 0855  Weight: 73.5 kg 73.5 kg 70.4 kg    Examination:  General exam: NAD.  Appears frail Respiratory system: Scattered crackles bilaterally.  Normal work of breathing.  1 L Cardiovascular system: S1-S2, RRR, no murmurs, no pedal edema Gastrointestinal system: Soft, NT/ND, normal bowel sounds Central nervous system: Alert and oriented. No focal neurological deficits. Extremities: Symmetric 5 x 5 power. Skin: No rashes, lesions or ulcers Psychiatry: Judgement and insight appear normal. Mood & affect appropriate.     Data Reviewed: I have personally reviewed following labs and imaging studies  CBC: Recent Labs  Lab 08/29/22 1316 08/30/22 0505 08/31/22 0508  WBC 7.0 5.8 9.2  NEUTROABS 6.1  --   --   HGB 11.4* 9.9* 10.6*  HCT 37.5* 32.8* 32.8*  MCV 95.2 95.6 93.2  PLT 194 159 627   Basic Metabolic Panel: Recent Labs  Lab 08/29/22 1316 08/30/22 0505 08/31/22 0508  NA 134* 138 137  K 3.6 3.0* 3.7  CL 93* 100 100  CO2 25 30 29   GLUCOSE 148* 94 122*  BUN 47* 43* 38*  CREATININE 1.64* 1.56* 1.41*  CALCIUM 8.6* 8.0* 7.9*   GFR: Estimated Creatinine Clearance: 37.7 mL/min (A) (by C-G formula based on SCr of 1.41 mg/dL (H)). Liver Function Tests: Recent Labs  Lab 08/29/22 1316  AST 150*  ALT 87*  ALKPHOS 184*  BILITOT 1.7*  PROT 7.3  ALBUMIN 3.1*    No results for input(s): "LIPASE", "AMYLASE" in the last 168 hours. No results for input(s): "AMMONIA" in the last 168 hours. Coagulation Profile: No results for input(s): "INR", "PROTIME" in the last 168 hours. Cardiac Enzymes: No results for input(s): "CKTOTAL", "CKMB", "CKMBINDEX", "TROPONINI" in the last 168 hours. BNP (last 3 results) No results for input(s): "PROBNP" in the last 8760 hours. HbA1C: No results for input(s): "HGBA1C" in the last 72 hours. CBG: No results for input(s): "GLUCAP" in the last 168 hours. Lipid Profile: No results for input(s): "CHOL", "HDL", "LDLCALC", "TRIG", "CHOLHDL", "LDLDIRECT" in the last 72 hours. Thyroid Function Tests: No results for input(s): "TSH", "T4TOTAL", "FREET4", "T3FREE", "THYROIDAB" in the last 72 hours. Anemia Panel: No results for input(s): "VITAMINB12", "FOLATE", "FERRITIN", "TIBC", "IRON", "RETICCTPCT" in the last 72 hours. Sepsis Labs: Recent Labs  Lab 08/29/22 1530 08/29/22 1829 08/29/22 2048 08/30/22 0505  PROCALCITON  --   --  0.58  --   LATICACIDVEN 2.4* 2.1* 1.6  1.7 1.5    Recent Results (from the past 240 hour(s))  Blood Culture (routine x 2)     Status: None (  Preliminary result)   Collection Time: 08/29/22  3:30 PM   Specimen: BLOOD  Result Value Ref Range Status   Specimen Description BLOOD BLOOD LEFT ARM  Final   Special Requests   Final    BOTTLES DRAWN AEROBIC AND ANAEROBIC Blood Culture results may not be optimal due to an excessive volume of blood received in culture bottles   Culture   Final    NO GROWTH 3 DAYS Performed at [facility], [address], [place], [place] [address]    Report Status PENDING  Incomplete  Blood Culture (routine x 2)     Status: None (Preliminary result)   Collection Time: 08/29/22  3:45 PM   Specimen: BLOOD  Result Value Ref Range Status   Specimen Description BLOOD BLOOD LEFT FOREARM  Final   Special Requests   Final    BOTTLES DRAWN AEROBIC AND ANAEROBIC  Blood Culture results may not be optimal due to an excessive volume of blood received in culture bottles   Culture   Final    NO GROWTH 3 DAYS Performed at [facility], [address] [place], [place] [address]    Report Status PENDING  Incomplete  Respiratory (~20 pathogens) panel by PCR     Status: None   Collection Time: 08/29/22  8:48 PM   Specimen: Nasopharyngeal Swab; Respiratory  Result Value Ref Range Status   Adenovirus NOT DETECTED NOT DETECTED Final   Coronavirus 229E NOT DETECTED NOT DETECTED Final    Comment: (NOTE) The Coronavirus on the Respiratory Panel, DOES NOT test for the novel  Coronavirus (2019 nCoV)    Coronavirus HKU1 NOT DETECTED NOT DETECTED Final   Coronavirus NL63 NOT DETECTED NOT DETECTED Final   Coronavirus OC43 NOT DETECTED NOT DETECTED Final   Metapneumovirus NOT DETECTED NOT DETECTED Final   Rhinovirus / Enterovirus NOT DETECTED NOT DETECTED Final   Influenza A NOT DETECTED NOT DETECTED Final   Influenza B NOT DETECTED NOT DETECTED Final   Parainfluenza Virus 1 NOT DETECTED NOT DETECTED Final   Parainfluenza Virus 2 NOT DETECTED NOT DETECTED Final   Parainfluenza Virus 3 NOT DETECTED NOT DETECTED Final   Parainfluenza Virus 4 NOT DETECTED NOT DETECTED Final   Respiratory Syncytial Virus NOT DETECTED NOT DETECTED Final   Bordetella pertussis NOT DETECTED NOT DETECTED Final   Bordetella Parapertussis NOT DETECTED NOT DETECTED Final   Chlamydophila pneumoniae NOT DETECTED NOT DETECTED Final   Mycoplasma pneumoniae NOT DETECTED NOT DETECTED Final    Comment: Performed at [facility], [place] [address], [place],  [address]         Radiology Studies: No results found.      Scheduled Meds:  furosemide  40 mg Intravenous BID   gabapentin  300 mg Oral TID   levothyroxine  175 mcg Oral QAC breakfast   midodrine  5 mg Oral TID WC   pantoprazole  40 mg Oral Daily   potassium chloride  40 mEq Oral BID   rivaroxaban  15 mg  Oral Q supper   rosuvastatin  10 mg Oral QHS   sertraline  150 mg Oral Daily   spironolactone  25 mg Oral Daily   tiotropium  18 mcg Inhalation Daily   Continuous Infusions:  ampicillin-sulbactam (UNASYN) IV 3 g (09/01/22 0907)     LOS: 3 days      Sidney Ace, MD [facility]   If 7PM-7AM, please contact night-coverage  09/01/2022, 11:49 AM

## 2022-09-01 NOTE — Progress Notes (Signed)
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  Patient now presents to the emergency room on 2/5 from his assisted living facility with increasing dyspnea on exertion, PNA.    PT Comments    Pt continues to be motivated to be active, needing less O2 today but still display increased work of breathing and does have SpO2 drops with activity.  Pt struggled more with bed mobility today but was able to do longer bouts of ambulation on less O2 today.  4L then 2L for 50ft bouts, O2 generally in the low 90s with activity but dropping to mid 80s on second attempt on less O2.  Pt fatigues with the effort but again good motivation.  Pt had consistent low grade unsteadiness with shuffling/choppy steps and need for support from AD but poor awareness and use of it.    Recommendations for follow up therapy are one component of a multi-disciplinary discharge planning process, led by the attending physician.  Recommendations may be updated based on patient status, additional functional criteria and insurance authorization.  Follow Up Recommendations  Home health PT     Assistance Recommended at Discharge Intermittent Supervision/Assistance  Patient can return home with the following A little help with walking and/or transfers;A little help with bathing/dressing/bathroom;Assist for transportation;Assistance with cooking/housework   Equipment Recommendations  None recommended by PT (has walker and cane, just needs to actually use)    Recommendations for Other Services       Precautions /  Restrictions Precautions Precautions: Fall Restrictions Weight Bearing Restrictions: No     Mobility  Bed Mobility Overal bed mobility: Needs Assistance Bed Mobility: Supine to Sit     Supine to sit: Mod assist     General bed mobility comments: Pt needed more assist today getting up to sitting and scooting toward EOB    Transfers Overall transfer level: Needs assistance Equipment used: Rolling walker (2 wheels) Transfers: Sit to/from Stand Sit to Stand: From elevated surface, Min assist           General transfer comment: unable to rise with min/mod assist from standard height bed.  elevated ~3", heavy cuing for UEs/set up and min assist to rise    Ambulation/Gait Ambulation/Gait assistance: Min assist Gait Distance (Feet): 70 Feet Assistive device: Rolling walker (2 wheels)         General Gait Details: 2 bouts of 70 ft with seated rest break and focused breating cues.  First bout on 4L with O2 dropping to low 90s, 2L on second attempt with SpO2 to 86%.  Pt with consistent self selected short/halting steps and high reliance on the walker. Consistently getting himself outside the walker during turns needing direct assist to manipulate walker to safe position.  Able to briefly increase step length and cadence but reverts to shuffling quickly w/o constant cuing.  No LOBs, but consistent unsteadiness.   Stairs             Wheelchair Mobility    Modified Rankin (Stroke Patients Only)       Balance Overall balance assessment: Needs assistance Sitting-balance support: Single extremity supported Sitting balance-Leahy Scale: Fair  Standing balance support: During functional activity Standing balance-Leahy Scale: Fair Standing balance comment: heavy use of UEs to maintain balance, again not overly comfortable/confident with the AD but necessary with his consistent unsteadiness, poor postural awareness and fatigue with activity                             Cognition Arousal/Alertness: Awake/alert (sleeping on arrival, intermittently appearing quite drowsy) Behavior During Therapy: WFL for tasks assessed/performed Overall Cognitive Status: No family/caregiver present to determine baseline cognitive functioning                                 General Comments: Similar presentation as eval with some general awareness but difficulty with multi step or sustained cuing, processing        Exercises      General Comments General comments (skin integrity, edema, etc.): Pt was sleeping on arrival with SpO2 ~90% on 2L, would occasionally get up to high 90s but also had moments of SOB with O2 dropping to the mid 80s. 2-4L O2 t/o the session      Pertinent Vitals/Pain Pain Assessment Pain Assessment: No/denies pain    Home Living                          Prior Function            PT Goals (current goals can now be found in the care plan section) Progress towards PT goals: Progressing toward goals    Frequency    Min 2X/week      PT Plan Current plan remains appropriate    Co-evaluation              AM-PAC PT "6 Clicks" Mobility   Outcome Measure  Help needed turning from your back to your side while in a flat bed without using bedrails?: A Little Help needed moving from lying on your back to sitting on the side of a flat bed without using bedrails?: A Little Help needed moving to and from a bed to a chair (including a wheelchair)?: A Little Help needed standing up from a chair using your arms (e.g., wheelchair or bedside chair)?: A Little Help needed to walk in hospital room?: A Little Help needed climbing 3-5 steps with a railing? : A Lot 6 Click Score: 17    End of Session Equipment Utilized During Treatment: Gait belt;Oxygen Activity Tolerance: Patient limited by fatigue Patient left: with chair alarm set;with call bell/phone within reach;with nursing/sitter in room Nurse  Communication: Mobility status PT Visit Diagnosis: Muscle weakness (generalized) (M62.81);Difficulty in walking, not elsewhere classified (R26.2);Unsteadiness on feet (R26.81)     Time: 1610-9604 PT Time Calculation (min) (ACUTE ONLY): 28 min  Charges:  $Gait Training: 8-22 mins $Therapeutic Activity: 8-22 mins                     Kreg Shropshire, DPT 09/01/2022, 5:12 PM

## 2022-09-01 NOTE — Progress Notes (Signed)
   09/01/22 0012  Assess: MEWS Score  Temp 98.6 F (37 C)  BP 95/65  MAP (mmHg) 73  Pulse Rate 78  Resp 12  SpO2 99 %  O2 Device HFNC  O2 Flow Rate (L/min) 2 L/min  Assess: MEWS Score  MEWS Temp 0  MEWS Systolic 1  MEWS Pulse 0  MEWS RR 1  MEWS LOC 0  MEWS Score 2  MEWS Score Color Yellow  Assess: if the MEWS score is Yellow or Red  Were vital signs taken at a resting state? Yes  Focused Assessment No change from prior assessment  Does the patient meet 2 or more of the SIRS criteria? No  MEWS guidelines implemented  No, vital signs rechecked  Notify: Charge Nurse/RN  Name of Charge Nurse/RN Notified Tiffany, RN  Assess: SIRS CRITERIA  SIRS Temperature  0  SIRS Pulse 0  SIRS Respirations  0  SIRS WBC 0  SIRS Score Sum  0

## 2022-09-02 DIAGNOSIS — A419 Sepsis, unspecified organism: Secondary | ICD-10-CM | POA: Diagnosis not present

## 2022-09-02 DIAGNOSIS — R6520 Severe sepsis without septic shock: Secondary | ICD-10-CM | POA: Diagnosis not present

## 2022-09-02 LAB — BLOOD GAS, VENOUS
Acid-Base Excess: 7 mmol/L — ABNORMAL HIGH (ref 0.0–2.0)
Bicarbonate: 35.3 mmol/L — ABNORMAL HIGH (ref 20.0–28.0)
O2 Saturation: 42.2 %
Patient temperature: 37
pCO2, Ven: 67 mmHg — ABNORMAL HIGH (ref 44–60)
pH, Ven: 7.33 (ref 7.25–7.43)
pO2, Ven: 31 mmHg — CL (ref 32–45)

## 2022-09-02 LAB — GLUCOSE, CAPILLARY: Glucose-Capillary: 111 mg/dL — ABNORMAL HIGH (ref 70–99)

## 2022-09-02 NOTE — Care Management Important Message (Signed)
Important Message  Patient Details  Name: Louis Sanchez MRN: [id number] Date of Birth: November 12, 1940   Medicare Important Message Given:  Yes     Dannette Barbara 09/02/2022, 2:18 PM

## 2022-09-02 NOTE — Progress Notes (Signed)
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   Patient presented to the emergency room on 2/5 from his assisted living facility with increasing dyspnea on exertion.  In the emergency room, patient found to have a BNP of almost 1500, acute kidney injury with a creatinine of 1.6, initial hypoxia requiring 6 L and lactic acid level of 2.4.  CT scan of the chest which noted new consolidation right lower lobe concerning for pneumonia versus aspiration pneumonitis as well as CHF.  Patient given IV antibiotics and Lasix and admitted to the hospitalist service for severe sepsis secondary to aspiration pneumonia as well as acute on chronic diastolic heart failure.   2/6.  Patient have right-sided pneumonia and right pleural effusion.  Spoke with patient about possible thoracentesis.  Patient also being treated for congestive heart failure with IV Lasix.  Patient on oxygen this morning and normally does not wear oxygen.  Patient has chronic wounds going on for years.  2/8: No fluid pocket amenable to thoracentesis.  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  Respiratory panel negative.  On empiric Unasyn.  So far blood cultures are negative. Plan: Continue IV Unasyn, last dose today Monitor vitals and fever curve   Aspiration pneumonia ([place]) Started on Unasyn.  Has chronic right pleural effusion.  Per ultrasound there is an insufficient fluid to tap.  Will complete 5-day course of Unasyn today 2/9   Acute respiratory failure with hypoxia (HCC) Initially required 6 L and pulse ox was 91%.  Patient tapered down to 2 L.  Unable to wean further at this time.  Patient with rapid desaturation when sleeping down into the 70s.  Suspect untreated underlying obstructive sleep apnea.  Overnight oximetry will be ordered tonight.  Discharge home anticipated tomorrow 2/10  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 heart failure) ([place]) On p.o. Lasix   AKI (acute kidney injury) ([place]) Watch closely with diuresis.   Chronic atrial fibrillation (HCC) Remains rate controlled.  Still in atrial fibrillation.  Renally dose adjust Xarelto.   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   Hypothyroidism Continue Synthroid   Hypokalemia Replace potassium orally   Wound of foot Wounds of bilateral feet top of the foot and medial right ankle.  See pictures below, present on admission.  Appreciate wound care consultation.    Pleural effusion on  right Chronic.  Insufficient fluid for tap   DVT prophylaxis: Xarelto Code Status: Full Family Communication: None Disposition Plan: Status is: Inpatient Remains inpatient appropriate because: Sepsis/pneumonia on IV antibiotics.  Patient  overall improving.  Unable to wean from oxygen.  Rapid desaturations noted.  If stabilized and overnight oximetry reassuring anticipate discharge home with home health 2/10   Level of care: Progressive  Consultants:  None  Procedures:  None  Antimicrobials: Unasyn   Subjective: Seen and examined.  Appears comfortable.  Symptomatically improved.  Objective: Vitals:   09/02/22 1150 09/02/22 1200 09/02/22 1210 09/02/22 1238  BP:    101/66  Pulse: 79 76 77 79  Resp:    16  Temp:    97.8 F (36.6 C)  TempSrc:    Oral  SpO2: 94% 91% 98% 100%  Weight:      Height:        Intake/Output Summary (Last 24 hours) at 09/02/2022 1430 Last data filed at 09/02/2022 1135 Gross per 24 hour  Intake 2556.67 ml  Output 1150 ml  Net 1406.67 ml   Filed Weights   08/30/22 0734 09/01/22 0855 09/02/22 0832  Weight: 73.5 kg 70.4 kg 69.1 kg    Examination:  General exam: No acute distress.  Appears comfortable Respiratory system: Scattered crackles bilaterally.  Normal work of breathing.  2 L Cardiovascular system: S1-S2, RRR, no murmurs, no pedal edema Gastrointestinal system: Soft, NT/ND, normal bowel sounds Central nervous system: Alert and oriented. No focal neurological deficits. Extremities: Symmetric 5 x 5 power. Skin: No rashes, lesions or ulcers Psychiatry: Judgement and insight appear normal. Mood & affect appropriate.     Data Reviewed: I have personally reviewed following labs and imaging studies  CBC: Recent Labs  Lab 08/29/22 1316 08/30/22 0505 08/31/22 0508  WBC 7.0 5.8 9.2  NEUTROABS 6.1  --   --   HGB 11.4* 9.9* 10.6*  HCT 37.5* 32.8* 32.8*  MCV 95.2 95.6 93.2  PLT 194 159 378   Basic Metabolic Panel: Recent Labs  Lab 08/29/22 1316 08/30/22 0505 08/31/22 0508  NA 134* 138 137  K 3.6 3.0* 3.7  CL 93* 100 100  CO2 25 30 29   GLUCOSE 148* 94 122*  BUN 47* 43* 38*  CREATININE 1.64* 1.56* 1.41*  CALCIUM 8.6* 8.0* 7.9*   GFR: Estimated Creatinine  Clearance: 37.7 mL/min (A) (by C-G formula based on SCr of 1.41 mg/dL (H)). Liver Function Tests: Recent Labs  Lab 08/29/22 1316  AST 150*  ALT 87*  ALKPHOS 184*  BILITOT 1.7*  PROT 7.3  ALBUMIN 3.1*   No results for input(s): "LIPASE", "AMYLASE" in the last 168 hours. No results for input(s): "AMMONIA" in the last 168 hours. Coagulation Profile: No results for input(s): "INR", "PROTIME" in the last 168 hours. Cardiac Enzymes: No results for input(s): "CKTOTAL", "CKMB", "CKMBINDEX", "TROPONINI" in the last 168 hours. BNP (last 3 results) No results for input(s): "PROBNP" in the last 8760 hours. HbA1C: No results for input(s): "HGBA1C" in the last 72 hours. CBG: Recent Labs  Lab 09/02/22 0828  GLUCAP 111*   Lipid Profile: No results for input(s): "CHOL", "HDL", "LDLCALC", "TRIG", "CHOLHDL", "LDLDIRECT" in the last 72 hours. Thyroid Function Tests: No results for input(s): "TSH", "T4TOTAL", "FREET4", "T3FREE", "THYROIDAB" in the last 72 hours. Anemia Panel: No results for input(s): "VITAMINB12", "FOLATE", "FERRITIN", "TIBC", "IRON", "RETICCTPCT" in the last 72 hours. Sepsis Labs: Recent Labs  Lab 08/29/22 1530 08/29/22 1829 08/29/22 2048 08/30/22 0505  PROCALCITON  --   --  0.58  --   LATICACIDVEN 2.4* 2.1* 1.6  1.7 1.5    Recent Results (from the past 240 hour(s))  Blood Culture (routine x 2)     Status: None (Preliminary result)   Collection Time: 08/29/22  3:30 PM   Specimen: BLOOD  Result Value Ref Range Status   Specimen Description BLOOD BLOOD LEFT ARM  Final   Special Requests   Final    BOTTLES DRAWN AEROBIC AND ANAEROBIC Blood Culture results may not be optimal due to an excessive volume of blood received in culture bottles   Culture   Final    NO GROWTH 4 DAYS Performed at [facility], [address] [place], [place] [address]    Report Status PENDING  Incomplete  Blood Culture (routine x 2)     Status: None (Preliminary result)    Collection Time: 08/29/22  3:45 PM   Specimen: BLOOD  Result Value Ref Range Status   Specimen Description BLOOD BLOOD LEFT FOREARM  Final   Special Requests   Final    BOTTLES DRAWN AEROBIC AND ANAEROBIC Blood Culture results may not be optimal due to an excessive volume of blood received in culture bottles   Culture   Final    NO GROWTH 4 DAYS Performed at [facility], [address] [place], [place] [address]    Report Status PENDING  Incomplete  Respiratory (~20 pathogens) panel by PCR     Status: None   Collection Time: 08/29/22  8:48 PM   Specimen: Nasopharyngeal Swab; Respiratory  Result Value Ref Range Status   Adenovirus NOT DETECTED NOT DETECTED Final   Coronavirus 229E NOT DETECTED NOT DETECTED Final    Comment: (NOTE) The Coronavirus on the Respiratory Panel, DOES NOT test for the novel  Coronavirus (2019 nCoV)    Coronavirus HKU1 NOT DETECTED NOT DETECTED Final   Coronavirus NL63 NOT DETECTED NOT DETECTED Final   Coronavirus OC43 NOT DETECTED NOT DETECTED Final   Metapneumovirus NOT DETECTED NOT DETECTED Final   Rhinovirus / Enterovirus NOT DETECTED NOT DETECTED Final   Influenza A NOT DETECTED NOT DETECTED Final   Influenza B NOT DETECTED NOT DETECTED Final   Parainfluenza Virus 1 NOT DETECTED NOT DETECTED Final   Parainfluenza Virus 2 NOT DETECTED NOT DETECTED Final   Parainfluenza Virus 3 NOT DETECTED NOT DETECTED Final   Parainfluenza Virus 4 NOT DETECTED NOT DETECTED Final   Respiratory Syncytial Virus NOT DETECTED NOT DETECTED Final   Bordetella pertussis NOT DETECTED NOT DETECTED Final   Bordetella Parapertussis NOT DETECTED NOT DETECTED Final   Chlamydophila pneumoniae NOT DETECTED NOT DETECTED Final   Mycoplasma pneumoniae NOT DETECTED NOT DETECTED Final    Comment: Performed at [facility], [place] [address] [place], [place] [address]         Radiology Studies: No results found.      Scheduled Meds:  furosemide  40 mg Oral  BID   gabapentin  300 mg Oral TID   levothyroxine  175 mcg Oral QAC breakfast   midodrine  5 mg Oral TID WC   pantoprazole  40 mg Oral Daily   potassium chloride  40 mEq Oral BID   rivaroxaban  15 mg Oral Q supper   rosuvastatin  10 mg Oral QHS   sertraline  150 mg Oral Daily   spironolactone  25 mg Oral Daily   tiotropium  18 mcg Inhalation Daily   Continuous Infusions:  ampicillin-sulbactam (UNASYN) IV Stopped (09/02/22 1033)  LOS: 4 days      Sidney Ace, MD [facility]   If 7PM-7AM, please contact night-coverage  09/02/2022, 2:30 PM

## 2022-09-03 DIAGNOSIS — A419 Sepsis, unspecified organism: Secondary | ICD-10-CM | POA: Diagnosis not present

## 2022-09-03 DIAGNOSIS — R6520 Severe sepsis without septic shock: Secondary | ICD-10-CM | POA: Diagnosis not present

## 2022-09-03 LAB — CULTURE, BLOOD (ROUTINE X 2)
Culture: NO GROWTH
Culture: NO GROWTH

## 2022-09-03 NOTE — TOC Transition Note (Signed)
Transition of Care [facility]) - CM/SW Discharge Note   Patient Details  Name: Louis Sanchez MRN: [id number] Date of Birth: 05/29/1941  [facility] CM/SW Contact:  Izola Price, RN Phone Number: 09/03/2022, 5:04 PM   Clinical Narrative:  2/10: 505 pm. [facility] non emergency transport called to transport to [place]. Report was called earlier by Unit RN, EMS paperwork printed to unit in AVS packet. Son aware of transport today. Simmie Davies RN CM      Final next level of care: Assisted Living Barriers to Discharge: Barriers Resolved   Patient Goals and CMS Choice      Discharge Placement                         Discharge Plan and Services Additional resources added to the After Visit Summary for                    DME Agency: [facility] (Had prior oxygen at home/ALF.)       HH Arranged: PT, OT HH Agency: [place] Date [place]:  (Confimred by prior CM on 09/01/22)      Social Determinants of Health (SDOH) Interventions SDOH Screenings   Food Insecurity: No Food Insecurity (08/30/2022)  Housing: Low Risk  (08/30/2022)  Transportation Needs: No Transportation Needs (08/30/2022)  Utilities: Not At Risk (08/30/2022)  Tobacco Use: Medium Risk (08/30/2022)     Readmission Risk Interventions     No data to display

## 2022-09-03 NOTE — Progress Notes (Addendum)
Report called to [place] and given to Leavy Cella, [facility] and Sinclair Grooms, med tech for this patient returning.  They were informed of what he came in, how he was treated, and that he now requires 2L [place] to be worn at all times and to bleed in 2L into his CPAP at night.  Instructed that patient is on a Dysphagia 3 diet, thin liquids, and should take pills whole in apple sauce.  They had no further questions.  When oxygen set up with [employer] is completed, EMS will be scheduled to send patient back to this facility.

## 2022-09-03 NOTE — NC FL2 (Signed)
[place] LEVEL OF CARE FORM     IDENTIFICATION  Patient Name: Louis Sanchez Birthdate: 02-20-1941 Sex: male Admission Date (Current Location): 08/29/2022  [place] and [place] Number:  Engineering geologist and Address:  [facility] [address], [place], [place] [address]      Provider Number: [id number]  Attending Physician Name and Address:  Sidney Ace, MD  Relative Name and Phone Number:  Clotilde Dieter [phone]    Current Level of Care: Hospital Recommended Level of Care: [place] Prior Approval Number:    Date Approved/Denied:   PASRR Number:    Discharge Plan: Other (Comment) ([facility] [place], [place])    Current Diagnoses: Patient Active Problem List   Diagnosis Date Noted   Severe sepsis ([place]) 08/29/2022   Acute respiratory failure with hypoxia ([place]) 08/29/2022   Aspiration pneumonia ([place]) 08/29/2022   AKI (acute kidney injury) ([place]) 08/29/2022   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 heart failure) ([place]) 08/10/2022   Permanent atrial fibrillation ([place]) 08/07/2022   Hypokalemia 08/07/2022   Pleural effusion due to CHF (congestive heart failure) ([place]) 04/11/2022   Iron deficiency anemia 04/11/2022   Anemia of chronic disease 02/18/2022   Essential hypertension 02/18/2022   Hypothyroidism 02/18/2022   Dyslipidemia 02/18/2022   CAP (community acquired pneumonia) 02/18/2022   Wound of foot 01/12/2021   Right ventricular dysfunction 10/27/2020   Acquired thrombophilia ([place]) 19/37/9024   Chronic systolic CHF (congestive heart failure) ([place]) 10/07/2020   Pleural effusion on right 04/08/2020   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/18/2019   Fusion of spine of cervical region 11/18/2019   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/18/2019   Neck pain 11/18/2019   Personal history of skin cancer 05/21/2019   Chronic anticoagulation 12/11/2016   Former tobacco  use 12/11/2016   Malignant neoplasm of lower lobe of right lung ([place]) 11/28/2016   Aortic atherosclerosis ([place]) 10/07/2016   Hypothyroidism (acquired) 10/07/2016   Obstructive sleep apnea syndrome 10/07/2016   Thrombocytopenia ([place]) 10/07/2016   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[place]) 10/03/2016   Mitral regurgitation 09/20/2016   Acute bronchitis 09/13/2016   Leukocytosis 09/13/2016   Venous stasis dermatitis 09/13/2016   LFT elevation    Alcoholic gastritis    Atrial fibrillation with RVR ([place]) 09/11/2016   Anxiety, mild 05/23/2016   Hematochezia 05/23/2016   History of hypothyroidism 05/23/2016   Chronic atrial fibrillation ([place]) 05/03/2016   Ruptured varicose vein 01/25/2016   Varicose veins of both lower extremities with complications 09/73/5329   Mitral valve insufficiency 08/25/2014   Inguinal hernia 02/18/2013   Actinic keratosis 01/21/2010   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   Benign neoplasm of colon 04/04/2008    [place] RESPIRATION BLADDER Height & Weight     Self, Time, Situation, Place  O2 (2L/[place] continuously and 2L to CPAP at night.) Continent, Incontinent, External catheter Weight: 69.1 kg Height:  5\' 6"  (167.6 cm)  BEHAVIORAL SYMPTOMS/MOOD NEUROLOGICAL BOWEL NUTRITION STATUS   (N/A)   Continent Diet  AMBULATORY STATUS COMMUNICATION OF NEEDS Skin   Limited Assist Verbally Other (Comment), PU Stage and Appropriate Care (Please see nursing flow sheets under skin assessment: Ankle wound, Righ and Left anterior feet status ulcers, and deep tissue pressure injury, purple, on coccyx noted 08/30/22)                       Personal Care Assistance Level of Assistance  Bathing,  Dressing Bathing Assistance: Limited assistance   Dressing Assistance: Limited assistance     Functional Limitations Info             SPECIAL CARE FACTORS FREQUENCY  PT (By licensed PT), OT (By licensed OT) ([facility]ia [facility], See orders in place for PT/OT)                     Contractures Contractures Info: Not present    Additional Factors Info  Code Status, Allergies Code Status Info: FULL CODE Allergies Info: Atorvastatin, Levofloxacin; Check chart for any updates           Current Medications (09/03/2022):  This is the current hospital active medication list Current Facility-Administered Medications  Medication Dose Route Frequency Provider Last Rate Last Admin   acetaminophen (TYLENOL) tablet 650 mg  650 mg Oral Q6H PRN Annita Brod, MD       Or   acetaminophen (TYLENOL) suppository 650 mg  650 mg Rectal Q6H PRN Annita Brod, MD       furosemide (LASIX) tablet 40 mg  40 mg Oral BID Ralene Muskrat B, MD   40 mg at 09/03/22 9163   gabapentin (NEURONTIN) capsule 300 mg  300 mg Oral TID Ralene Muskrat B, MD   300 mg at 09/03/22 8466   levothyroxine (SYNTHROID) tablet 175 mcg  175 mcg Oral QAC breakfast Annita Brod, MD   175 mcg at 09/03/22 0639   midodrine (PROAMATINE) tablet 5 mg  5 mg Oral TID WC Loletha Grayer, MD   5 mg at 09/03/22 1206   ondansetron (ZOFRAN) tablet 4 mg  4 mg Oral Q6H PRN Annita Brod, MD       Or   ondansetron [facility]) injection 4 mg  4 mg Intravenous Q6H PRN Annita Brod, MD       pantoprazole (PROTONIX) EC tablet 40 mg  40 mg Oral Daily Loletha Grayer, MD   40 mg at 09/03/22 5993   polyethylene glycol (MIRALAX / GLYCOLAX) packet 17 g  17 g Oral Daily PRN Annita Brod, MD       potassium chloride SA (KLOR-CON M) CR tablet 40 mEq  40 mEq Oral BID Loletha Grayer, MD   40 mEq at 09/03/22 5701   Rivaroxaban (XARELTO) tablet 15 mg  15 mg Oral Q supper Noralee Space, RPH   15 mg at 09/02/22 1730   rosuvastatin (CRESTOR) tablet 10 mg  10 mg Oral QHS Annita Brod, MD   10 mg at 09/02/22 2037   sertraline (ZOLOFT) tablet 150 mg  150 mg Oral Daily Annita Brod, MD   150 mg at 09/03/22 7793   spironolactone (ALDACTONE) tablet 25 mg  25 mg Oral Daily Ralene Muskrat  B, MD   25 mg at 09/03/22 0952   tiotropium [facility]) inhalation capsule ([facility] use ONLY) 18 mcg  18 mcg Inhalation Daily Annita Brod, MD   18 mcg at 09/03/22 9030     Discharge Medications: Please see discharge summary for a list of discharge medications.  Relevant Imaging Results:  Relevant Lab Results:   Additional Information Patient SS# [id number]  Izola Price, RN

## 2022-09-03 NOTE — Progress Notes (Signed)
SATURATION QUALIFICATIONS: (This note is used to comply with regulatory documentation for home oxygen)  Patient Saturations on Room Air at Rest = 84%  Patient Saturations on Room Air while Ambulating = n/a%  Patient Saturations on n/a Liters of oxygen while Ambulating = n/a%  Please briefly explain why patient needs home oxygen:  Pt. Cannot maintain oxygen saturation at rest on room air.

## 2022-09-03 NOTE — Discharge Summary (Signed)
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   Patient presented to the emergency room on 2/5 from his assisted living facility with increasing dyspnea on exertion.  In the emergency room, patient found to have a BNP of almost 1500, acute kidney injury with a creatinine of 1.6, initial hypoxia requiring 6 L and lactic acid level of 2.4.  CT scan of the chest which noted new consolidation right lower lobe concerning for pneumonia versus aspiration pneumonitis as well as CHF.  Patient given IV antibiotics and Lasix and admitted to the hospitalist service for severe sepsis secondary to aspiration pneumonia as well as acute on chronic diastolic heart failure.   2/6.  Patient have right-sided pneumonia and right pleural effusion.  Spoke with patient about possible thoracentesis.  Patient also being treated for congestive heart failure with IV Lasix.  Patient on oxygen this morning and normally does not wear oxygen.  Patient has chronic wounds going on for years.   2/8: No fluid pocket amenable to thoracentesis.  Patient overall improving 2/10.  Patient back to baseline.  Remains dependent on 2 L nasal cannula.  Will discharge with  DME oxygen for 2 L.  CPAP at night.  No further need for antibiotics.    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  Respiratory panel negative.  On empiric Unasyn.  So far blood cultures are negative. Plan: Completed 5-day course of Unasyn in house.  No further antibiotics indicated.  Afebrile at time of discharge   Aspiration pneumonia ([place]) Started on Unasyn.  Has chronic right pleural effusion.  Per ultrasound there is an insufficient fluid to tap.  Completed 5-day course of Unasyn as above   Acute respiratory failure with hypoxia (HCC) Initially required 6 L and pulse ox was 91%.  Patient tapered down to 2 L.  Unable to wean further at this time.  Patient with rapid desaturation when sleeping down into the 70s.  Patient will resume home CPAP at time of discharge and we will prescribe 2 L home oxygen   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 heart failure) ([place]) On p.o. Lasix   AKI (acute kidney injury) ([place]) Watch closely with diuresis.  Resolved   Chronic atrial fibrillation (HCC) Remains rate controlled.  Still in atrial fibrillation.  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  See pictures below, present on admission.  Appreciate wound care consultation.  Discharge wound recommendations placed in chart   Pleural effusion on right Chronic.  Insufficient fluid for tap   Discharge Instructions  Discharge Instructions     Diet - low sodium heart healthy   Complete by: As directed    Discharge wound care:   Complete by: As directed    Wound care  Daily      Comments: Cleanse bilat foot wounds with NS, then top with a piece of calcium alginate Kellie Simmering # [phone]) and cover with an ABD pads and Kerlix.   Increase activity slowly   Complete by: As directed       Allergies as of 09/03/2022       Reactions   Atorvastatin    Other reaction(s): Muscle Pain Other reaction(s): Muscle Pain Other reaction(s): Muscle Pain Other reaction(s): Muscle Pain Other reaction(s): Muscle Pain   Levofloxacin Rash        Medication List     TAKE these medications    furosemide 40 MG tablet Commonly known as: LASIX Take 1 tablet (40 mg total) by mouth 2 (two) times daily.   gabapentin 300 MG capsule Commonly known as: NEURONTIN Take 1 capsule (300 mg total) by mouth 3 (three) times daily.   IRON PO Take 325 mg by mouth daily.   levothyroxine 175 MCG tablet Commonly known as: SYNTHROID Take 175 mcg by mouth daily before breakfast. Every day but Sunday   metoprolol succinate 25 MG 24 hr tablet Commonly known as: Toprol XL Take 0.5 tablets (12.5 mg total) by mouth at bedtime. What changed: how much to take   multivitamin tablet Take 1 tablet by mouth daily.   nitroGLYCERIN 0.4 MG SL tablet Commonly known as: NITROSTAT Place under the tongue.   rivaroxaban 20 MG Tabs tablet Commonly known as: XARELTO Take 20 mg by  mouth daily with supper.   rosuvastatin 10 MG tablet Commonly known as: CRESTOR Take 10 mg by mouth daily.   sertraline 100 MG tablet Commonly known as: ZOLOFT Take 1.5 tablets by mouth daily.   spironolactone 25 MG tablet Commonly known as: ALDACTONE Take 1 tablet (25 mg total) by mouth daily.   tiotropium 18 MCG inhalation capsule Commonly known as: SPIRIVA Place 18 mcg into inhaler and inhale daily.               Durable Medical Equipment  (From admission, onward)           Start     Ordered   09/03/22 0909  For home use only DME oxygen  Once       Question Answer Comment  Length of Need 6 Months   Mode or (Route) Nasal cannula   Liters per Minute 2   Frequency Continuous (stationary and portable oxygen unit needed)   Oxygen conserving device Yes   Oxygen delivery system Gas      02 /10/24 0908  Discharge Care Instructions  (From admission, onward)           Start     Ordered   09/03/22 0000  Discharge wound care:       Comments: Wound care  Daily      Comments: Cleanse bilat foot wounds with NS, then top with a piece of calcium alginate Kellie Simmering # [phone]) and cover with an ABD pads and Kerlix.   09/03/22 1115            Allergies  Allergen Reactions   Atorvastatin     Other reaction(s): Muscle Pain Other reaction(s): Muscle Pain Other reaction(s): Muscle Pain Other reaction(s): Muscle Pain Other reaction(s): Muscle Pain    Levofloxacin Rash    Consultations: None   Procedures/Studies: [place] CHEST (PLEURAL EFFUSION)  Result Date: 08/30/2022 CLINICAL DATA:  Right pleural effusion EXAM: CHEST ULTRASOUND COMPARISON:  None Available. FINDINGS: Focused sonographic exam of the right chest was performed for fluid assessment. Images demonstrate small amount of heterogeneous fluid in the right posterior pleural space, not amenable to image guided thoracentesis. IMPRESSION: Small amount of heterogeneous fluid in the right  posterior pleural space, not amenable for image guided thoracentesis. No intervention performed. This fluid has been described as a chronic loculated effusion on recent cross-sectional imaging. Electronically Signed   By: Albin Felling M.D.   On: 08/30/2022 12:41   CT CHEST WO CONTRAST  Result Date: 08/29/2022 CLINICAL DATA:  Blunt trauma.  Multiple falls. EXAM: CT CHEST WITHOUT CONTRAST TECHNIQUE: Multidetector CT imaging of the chest was performed following the standard protocol without IV contrast. RADIATION DOSE REDUCTION: This exam was performed according to the departmental dose-optimization program which includes automated exposure control, adjustment of the mA and/or kV according to patient size and/or use of iterative reconstruction technique. COMPARISON:  CT chest 12/02/2021 FINDINGS: Cardiovascular: Heart is enlarged. The LEFT and RIGHT atrium are markedly enlarged similar to prior. No pericardial fluid. Coronary calcifications present. Mediastinum/Nodes: No mediastinal hilar adenopathy. Multiple small RIGHT supraclavicular nodes. Lungs/Pleura: Chronic loculated pleural fluid in the RIGHT lower lobe with thickened rim. Findings unchanged from prior. There is consolidation in the RIGHT lower lobe (image 99/4) which is new from prior. Small LEFT effusion.  No pneumothorax. Upper Abdomen: Limited view of the liver, kidneys, pancreas are unremarkable. Normal adrenal glands. Small amount of intraperitoneal free fluid along the margin liver which simple fluid attenuation. Musculoskeletal: No evidence of rib fracture scapular fracture. No sternal fracture IMPRESSION: 1. Chronic loculated RIGHT pleural effusion. 2. New consolidation in the RIGHT lower lobe is concerning for pneumonia or aspiration pneumonitis. 3. No evidence of thoracic trauma.  No fracture or pneumothorax. 4. Small amount of free fluid in the upper abdomen. 5. Anasarca of the soft tissues suggest volume overload. Markedly enlarged cardiac  atria. 6. Small RIGHT supraclavicular nodes are favored reactive. Electronically Signed   By: Suzy Bouchard M.D.   On: 08/29/2022 14:50   CT HEAD WO CONTRAST (5MM)  Result Date: 08/29/2022 CLINICAL DATA:  Multiple falls since Friday. Right chest bruising. Short of breath. Slurred speech. EXAM: CT HEAD WITHOUT CONTRAST CT CERVICAL SPINE WITHOUT CONTRAST TECHNIQUE: Multidetector CT imaging of the head and cervical spine was performed following the standard protocol without intravenous contrast. Multiplanar CT image reconstructions of the cervical spine were also generated. RADIATION DOSE REDUCTION: This exam was performed according to the departmental dose-optimization program which includes automated exposure control, adjustment of the mA and/or kV according to patient size and/or use of iterative reconstruction  technique. COMPARISON:  10/02/2021 FINDINGS: CT HEAD FINDINGS Brain: No evidence of acute infarction, hemorrhage, hydrocephalus, extra-axial collection or mass lesion/mass effect. Vascular: No hyperdense vessel or unexpected calcification. Skull: Normal. Negative for fracture or focal lesion. Sinuses/Orbits: Globes and orbits are unremarkable. Visualized sinuses are clear. Other: None. CT CERVICAL SPINE FINDINGS Alignment: Normal. Skull base and vertebrae: No acute fracture. No primary bone lesion or focal pathologic process. Soft tissues and spinal canal: No prevertebral fluid or swelling. No visible canal hematoma. Disc levels: Status post anterior cervical fusion at C6-C7. Screws and anterior fixation plate are well seated and unchanged. There is mature bone graft material extending across the disc interspace. Mild loss of disc height at C2-C3. Moderate loss of disc height from C3-C4 through C6-C7. Disc bulging and endplate spurring at these levels. No convincing disc herniation. Facet degenerative change most evident on the left at C3-C4. Upper chest: Right upper chest soft tissue edema. Other:  None. IMPRESSION: HEAD CT 1. No acute intracranial abnormalities. CERVICAL CT 1. No fracture or acute skeletal abnormality. 2. Right neck base soft tissue edema. Please refer to the current chest CT. Electronically Signed   By: Lajean Manes M.D.   On: 08/29/2022 14:48   CT Cervical Spine Wo Contrast  Result Date: 08/29/2022 CLINICAL DATA:  Multiple falls since Friday. Right chest bruising. Short of breath. Slurred speech. EXAM: CT HEAD WITHOUT CONTRAST CT CERVICAL SPINE WITHOUT CONTRAST TECHNIQUE: Multidetector CT imaging of the head and cervical spine was performed following the standard protocol without intravenous contrast. Multiplanar CT image reconstructions of the cervical spine were also generated. RADIATION DOSE REDUCTION: This exam was performed according to the departmental dose-optimization program which includes automated exposure control, adjustment of the mA and/or kV according to patient size and/or use of iterative reconstruction technique. COMPARISON:  10/02/2021 FINDINGS: CT HEAD FINDINGS Brain: No evidence of acute infarction, hemorrhage, hydrocephalus, extra-axial collection or mass lesion/mass effect. Vascular: No hyperdense vessel or unexpected calcification. Skull: Normal. Negative for fracture or focal lesion. Sinuses/Orbits: Globes and orbits are unremarkable. Visualized sinuses are clear. Other: None. CT CERVICAL SPINE FINDINGS Alignment: Normal. Skull base and vertebrae: No acute fracture. No primary bone lesion or focal pathologic process. Soft tissues and spinal canal: No prevertebral fluid or swelling. No visible canal hematoma. Disc levels: Status post anterior cervical fusion at C6-C7. Screws and anterior fixation plate are well seated and unchanged. There is mature bone graft material extending across the disc interspace. Mild loss of disc height at C2-C3. Moderate loss of disc height from C3-C4 through C6-C7. Disc bulging and endplate spurring at these levels. No convincing disc  herniation. Facet degenerative change most evident on the left at C3-C4. Upper chest: Right upper chest soft tissue edema. Other: None. IMPRESSION: HEAD CT 1. No acute intracranial abnormalities. CERVICAL CT 1. No fracture or acute skeletal abnormality. 2. Right neck base soft tissue edema. Please refer to the current chest CT. Electronically Signed   By: Lajean Manes M.D.   On: 08/29/2022 14:48   DG Chest Portable 1 View  Result Date: 08/29/2022 CLINICAL DATA:  Fall EXAM: PORTABLE CHEST 1 VIEW COMPARISON:  Chest x-ray dated August 07, 2022 FINDINGS: Unchanged cardiomegaly. Right hemithorax volume loss, similar to prior. Moderate right pleural effusion and right lower lung opacities, unchanged when compared with prior exam. No evidence of pneumothorax. IMPRESSION: Moderate right pleural effusion and right lower lung opacities, unchanged when compared with the prior Electronically Signed   By: Yetta Glassman M.D.   On:  08/29/2022 14:34   DG Chest 2 View  Result Date: 08/07/2022 CLINICAL DATA:  CHF EXAM: CHEST - 2 VIEW COMPARISON:  April 10, 2022, October 02, 2021 FINDINGS: The cardiomediastinal silhouette is unchanged and enlarged in contour. Moderate RIGHT pleural effusion with a small loculated component, similar in comparison to prior but increased since March 2023. Decreased LEFT pleural effusion in comparison to prior. No pneumothorax. Persistent RIGHT basilar airspace opacity with reticular markings. Overall decrease of bilateral interstitial markings in comparison to prior. Visualized abdomen is unremarkable. Status post ACDF. IMPRESSION: 1. Moderate RIGHT pleural effusion with a small loculated component, similar in comparison to prior but increased since March 2023. 2. Decreased LEFT pleural effusion in comparison to prior. 3. Improved pulmonary edema comparison to prior from September. There may be a degree of residual mild underlying pulmonary edema. Electronically Signed   By: Valentino Saxon M.D.   On: 08/07/2022 13:01      Subjective: Seen and examined on the day of discharge.  Sitting up in chair.  Stable no distress.  Discharge Exam: Vitals:   09/03/22 0952 09/03/22 1000  BP:    Pulse: (!) 103 (!) 108  Resp:    Temp:    SpO2: 92% 98%   Vitals:   09/03/22 0915 09/03/22 0951 09/03/22 0952 09/03/22 1000  BP:      Pulse: 94 (!) 106 (!) 103 (!) 108  Resp:      Temp:      TempSrc:      SpO2: 98% (!) 84% 92% 98%  Weight:      Height:        General: Pt is alert, awake, not in acute distress Cardiovascular: RRR, S1/S2 +, no rubs, no gallops Respiratory: CTA bilaterally, no wheezing, no rhonchi Abdominal: Soft, NT, ND, bowel sounds + Extremities: no edema, no cyanosis    The results of significant diagnostics from this hospitalization (including imaging, microbiology, ancillary and laboratory) are listed below for reference.     Microbiology: Recent Results (from the past 240 hour(s))  Blood Culture (routine x 2)     Status: None   Collection Time: 08/29/22  3:30 PM   Specimen: BLOOD  Result Value Ref Range Status   Specimen Description BLOOD BLOOD LEFT ARM  Final   Special Requests   Final    BOTTLES DRAWN AEROBIC AND ANAEROBIC Blood Culture results may not be optimal due to an excessive volume of blood received in culture bottles   Culture   Final    NO GROWTH 5 DAYS Performed at [facility], [address] [place], [place] [address]    Report Status 09/03/2022 FINAL  Final  Blood Culture (routine x 2)     Status: None   Collection Time: 08/29/22  3:45 PM   Specimen: BLOOD  Result Value Ref Range Status   Specimen Description BLOOD BLOOD LEFT FOREARM  Final   Special Requests   Final    BOTTLES DRAWN AEROBIC AND ANAEROBIC Blood Culture results may not be optimal due to an excessive volume of blood received in culture bottles   Culture   Final    NO GROWTH 5 DAYS Performed at [facility], [address]  [place], [place] [address]    Report Status 09/03/2022 FINAL  Final  Respiratory (~20 pathogens) panel by PCR     Status: None   Collection Time: 08/29/22  8:48 PM   Specimen: Nasopharyngeal Swab; Respiratory  Result Value Ref Range Status  Adenovirus NOT DETECTED NOT DETECTED Final   Coronavirus 229E NOT DETECTED NOT DETECTED Final    Comment: (NOTE) The Coronavirus on the Respiratory Panel, DOES NOT test for the novel  Coronavirus (2019 nCoV)    Coronavirus HKU1 NOT DETECTED NOT DETECTED Final   Coronavirus NL63 NOT DETECTED NOT DETECTED Final   Coronavirus OC43 NOT DETECTED NOT DETECTED Final   Metapneumovirus NOT DETECTED NOT DETECTED Final   Rhinovirus / Enterovirus NOT DETECTED NOT DETECTED Final   Influenza A NOT DETECTED NOT DETECTED Final   Influenza B NOT DETECTED NOT DETECTED Final   Parainfluenza Virus 1 NOT DETECTED NOT DETECTED Final   Parainfluenza Virus 2 NOT DETECTED NOT DETECTED Final   Parainfluenza Virus 3 NOT DETECTED NOT DETECTED Final   Parainfluenza Virus 4 NOT DETECTED NOT DETECTED Final   Respiratory Syncytial Virus NOT DETECTED NOT DETECTED Final   Bordetella pertussis NOT DETECTED NOT DETECTED Final   Bordetella Parapertussis NOT DETECTED NOT DETECTED Final   Chlamydophila pneumoniae NOT DETECTED NOT DETECTED Final   Mycoplasma pneumoniae NOT DETECTED NOT DETECTED Final    Comment: Performed at [facility], [place] [address], [place], [place] [address]     Labs: BNP (last 3 results) Recent Labs    04/11/22 0416 08/07/22 1211 08/29/22 1340  BNP 1,033.9* 651.0* 4,403.4*   Basic Metabolic Panel: Recent Labs  Lab 08/29/22 1316 08/30/22 0505 08/31/22 0508  NA 134* 138 137  K 3.6 3.0* 3.7  CL 93* 100 100  CO2 25 30 29   GLUCOSE 148* 94 122*  BUN 47* 43* 38*  CREATININE 1.64* 1.56* 1.41*  CALCIUM 8.6* 8.0* 7.9*   Liver Function Tests: Recent Labs  Lab 08/29/22 1316  AST 150*  ALT 87*  ALKPHOS 184*  BILITOT 1.7*  PROT 7.3  ALBUMIN  3.1*   No results for input(s): "LIPASE", "AMYLASE" in the last 168 hours. No results for input(s): "AMMONIA" in the last 168 hours. CBC: Recent Labs  Lab 08/29/22 1316 08/30/22 0505 08/31/22 0508  WBC 7.0 5.8 9.2  NEUTROABS 6.1  --   --   HGB 11.4* 9.9* 10.6*  HCT 37.5* 32.8* 32.8*  MCV 95.2 95.6 93.2  PLT 194 159 153   Cardiac Enzymes: No results for input(s): "CKTOTAL", "CKMB", "CKMBINDEX", "TROPONINI" in the last 168 hours. BNP: Invalid input(s): "POCBNP" CBG: Recent Labs  Lab 09/02/22 0828  GLUCAP 111*   D-Dimer No results for input(s): "DDIMER" in the last 72 hours. Hgb A1c No results for input(s): "HGBA1C" in the last 72 hours. Lipid Profile No results for input(s): "CHOL", "HDL", "LDLCALC", "TRIG", "CHOLHDL", "LDLDIRECT" in the last 72 hours. Thyroid function studies No results for input(s): "TSH", "T4TOTAL", "T3FREE", "THYROIDAB" in the last 72 hours.  Invalid input(s): "FREET3" Anemia work up No results for input(s): "VITAMINB12", "FOLATE", "FERRITIN", "TIBC", "IRON", "RETICCTPCT" in the last 72 hours. Urinalysis    Component Value Date/Time   COLORURINE AMBER (A) 09/11/2016 1441   APPEARANCEUR CLEAR (A) 09/11/2016 1441   LABSPEC 1.021 09/11/2016 1441   PHURINE 5.0 09/11/2016 1441   GLUCOSEU NEGATIVE 09/11/2016 1441   HGBUR NEGATIVE 09/11/2016 1441   BILIRUBINUR NEGATIVE 09/11/2016 1441   KETONESUR NEGATIVE 09/11/2016 1441   PROTEINUR NEGATIVE 09/11/2016 1441   NITRITE NEGATIVE 09/11/2016 1441   LEUKOCYTESUR NEGATIVE 09/11/2016 1441   Sepsis Labs Recent Labs  Lab 08/29/22 1316 08/30/22 0505 08/31/22 0508  WBC 7.0 5.8 9.2   Microbiology Recent Results (from the past 240 hour(s))  Blood Culture (routine  x 2)     Status: None   Collection Time: 08/29/22  3:30 PM   Specimen: BLOOD  Result Value Ref Range Status   Specimen Description BLOOD BLOOD LEFT ARM  Final   Special Requests   Final    BOTTLES DRAWN AEROBIC AND ANAEROBIC Blood Culture  results may not be optimal due to an excessive volume of blood received in culture bottles   Culture   Final    NO GROWTH 5 DAYS Performed at [facility], [address] [place], [place] [address]    Report Status 09/03/2022 FINAL  Final  Blood Culture (routine x 2)     Status: None   Collection Time: 08/29/22  3:45 PM   Specimen: BLOOD  Result Value Ref Range Status   Specimen Description BLOOD BLOOD LEFT FOREARM  Final   Special Requests   Final    BOTTLES DRAWN AEROBIC AND ANAEROBIC Blood Culture results may not be optimal due to an excessive volume of blood received in culture bottles   Culture   Final    NO GROWTH 5 DAYS Performed at [facility], [address] [place], [place] [address]    Report Status 09/03/2022 FINAL  Final  Respiratory (~20 pathogens) panel by PCR     Status: None   Collection Time: 08/29/22  8:48 PM   Specimen: Nasopharyngeal Swab; Respiratory  Result Value Ref Range Status   Adenovirus NOT DETECTED NOT DETECTED Final   Coronavirus 229E NOT DETECTED NOT DETECTED Final    Comment: (NOTE) The Coronavirus on the Respiratory Panel, DOES NOT test for the novel  Coronavirus (2019 nCoV)    Coronavirus HKU1 NOT DETECTED NOT DETECTED Final   Coronavirus NL63 NOT DETECTED NOT DETECTED Final   Coronavirus OC43 NOT DETECTED NOT DETECTED Final   Metapneumovirus NOT DETECTED NOT DETECTED Final   Rhinovirus / Enterovirus NOT DETECTED NOT DETECTED Final   Influenza A NOT DETECTED NOT DETECTED Final   Influenza B NOT DETECTED NOT DETECTED Final   Parainfluenza Virus 1 NOT DETECTED NOT DETECTED Final   Parainfluenza Virus 2 NOT DETECTED NOT DETECTED Final   Parainfluenza Virus 3 NOT DETECTED NOT DETECTED Final   Parainfluenza Virus 4 NOT DETECTED NOT DETECTED Final   Respiratory Syncytial Virus NOT DETECTED NOT DETECTED Final   Bordetella pertussis NOT DETECTED NOT DETECTED Final   Bordetella Parapertussis NOT DETECTED NOT DETECTED Final    Chlamydophila pneumoniae NOT DETECTED NOT DETECTED Final   Mycoplasma pneumoniae NOT DETECTED NOT DETECTED Final    Comment: Performed at [facility], [place] [address] [place], [place] [address]     Time coordinating discharge: Over 30 minutes  SIGNED:   Sidney Ace, MD  [facility] 09/03/2022, 11:16 AM Pager   If 7PM-7AM, please contact night-coverage

## 2022-09-03 NOTE — TOC Progression Note (Addendum)
Transition of Care [facility]) - Progression Note    Patient Details  Name: Louis Sanchez MRN: [id number] Date of Birth: [birth year]  Transition of Care [facility]) CM/SW Contact  Izola Price, RN Phone Number: 09/03/2022, 9:53 AM  Clinical Narrative:   09/13/22: Contacted main number and was transferred to Exie Parody, at [facility] [phone] as Merleen Nicely is not on today. Updated Alia pending situation and that facilty can accept patient back today, but no transportation is provided on weekends. Family or EMS will need to transport back to ALF. Patient pending any new DME oxygen and CPAP orders and DC Summary. New FL2 and DC summary will be faxed to [phone] when ready. Simmie Davies RN CM    Pending delivery of DME oxygen for continuous and HS use with CPAP via [employer]. DC Summary and FL2 faxed to [phone]. Will need EMS transport per son at time all is ready. Simmie Davies RN CM   DME oxygen pending delivery at 330. EMS forms printed to unit along with non emergent [facility] in case set up runs late into the day. Simmie Davies RN CM    Barriers to Discharge: Barriers Resolved  Expected Discharge Plan and Services                           DME Agency: [employer] (Had prior oxygen at home/ALF.)       HH Arranged: PT, OT HH Agency: [place] Date [place]:  (Confimred by prior CM on 09/01/22)       Social Determinants of Health (SDOH) Interventions SDOH Screenings   Food Insecurity: No Food Insecurity (08/30/2022)  Housing: Low Risk  (08/30/2022)  Transportation Needs: No Transportation Needs (08/30/2022)  Utilities: Not At Risk (08/30/2022)  Tobacco Use: Medium Risk (08/30/2022)    Readmission Risk Interventions     No data to display

## 2022-09-03 NOTE — Progress Notes (Signed)
Overnight oximetry initiated on cpap with 4L O2 [place].  Initial O2 sat 91-92.

## 2022-09-03 NOTE — Progress Notes (Signed)
EMS helped to load patient onto stretcher on 2L [place].  Patient left with a belonging bag and cell phone.

## 2022-09-06 ENCOUNTER — Other Ambulatory Visit
Admission: RE | Admit: 2022-09-06 | Discharge: 2022-09-06 | Disposition: A | Discharge status: Home / Self Care | Payer: Medicare Other | Source: Ambulatory Visit | Attending: Physician Assistant | Admitting: Physician Assistant

## 2022-09-06 DIAGNOSIS — R042 Hemoptysis: Secondary | ICD-10-CM | POA: Diagnosis not present

## 2022-09-06 DIAGNOSIS — I5022 Chronic systolic (congestive) heart failure: Secondary | ICD-10-CM | POA: Insufficient documentation

## 2022-09-06 DIAGNOSIS — I110 Hypertensive heart disease with heart failure: Secondary | ICD-10-CM | POA: Diagnosis not present

## 2022-09-06 LAB — BRAIN NATRIURETIC PEPTIDE: B Natriuretic Peptide: 1432.6 pg/mL — ABNORMAL HIGH (ref 0.0–100.0)

## 2022-09-07 ENCOUNTER — Emergency Department: Payer: Medicare Other

## 2022-09-07 ENCOUNTER — Inpatient Hospital Stay
Admission: EM | Admit: 2022-09-07 | Discharge: 2022-09-16 | DRG: 291 | Disposition: A | Discharge status: Skilled Nursing Facility | Payer: Medicare Other | Source: Skilled Nursing Facility | Attending: Internal Medicine | Admitting: Internal Medicine

## 2022-09-07 ENCOUNTER — Other Ambulatory Visit: Payer: Self-pay

## 2022-09-07 ENCOUNTER — Observation Stay: Payer: Medicare Other

## 2022-09-07 DIAGNOSIS — E039 Hypothyroidism, unspecified: Secondary | ICD-10-CM | POA: Diagnosis present

## 2022-09-07 DIAGNOSIS — J9611 Chronic respiratory failure with hypoxia: Secondary | ICD-10-CM | POA: Diagnosis not present

## 2022-09-07 DIAGNOSIS — Z66 Do not resuscitate: Secondary | ICD-10-CM | POA: Diagnosis not present

## 2022-09-07 DIAGNOSIS — I4821 Permanent atrial fibrillation: Secondary | ICD-10-CM | POA: Diagnosis present

## 2022-09-07 DIAGNOSIS — I959 Hypotension, unspecified: Secondary | ICD-10-CM | POA: Diagnosis not present

## 2022-09-07 DIAGNOSIS — Z902 Acquired absence of lung [part of]: Secondary | ICD-10-CM

## 2022-09-07 DIAGNOSIS — K7460 Unspecified cirrhosis of liver: Secondary | ICD-10-CM | POA: Diagnosis present

## 2022-09-07 DIAGNOSIS — J90 Pleural effusion, not elsewhere classified: Secondary | ICD-10-CM | POA: Diagnosis present

## 2022-09-07 DIAGNOSIS — K219 Gastro-esophageal reflux disease without esophagitis: Secondary | ICD-10-CM | POA: Diagnosis present

## 2022-09-07 DIAGNOSIS — Z7901 Long term (current) use of anticoagulants: Secondary | ICD-10-CM

## 2022-09-07 DIAGNOSIS — H5789 Other specified disorders of eye and adnexa: Secondary | ICD-10-CM | POA: Diagnosis not present

## 2022-09-07 DIAGNOSIS — I110 Hypertensive heart disease with heart failure: Principal | ICD-10-CM | POA: Diagnosis present

## 2022-09-07 DIAGNOSIS — Z79890 Hormone replacement therapy (postmenopausal): Secondary | ICD-10-CM

## 2022-09-07 DIAGNOSIS — I5022 Chronic systolic (congestive) heart failure: Secondary | ICD-10-CM | POA: Diagnosis present

## 2022-09-07 DIAGNOSIS — Z86010 Personal history of colonic polyps: Secondary | ICD-10-CM

## 2022-09-07 DIAGNOSIS — R791 Abnormal coagulation profile: Secondary | ICD-10-CM | POA: Diagnosis present

## 2022-09-07 DIAGNOSIS — Z79899 Other long term (current) drug therapy: Secondary | ICD-10-CM

## 2022-09-07 DIAGNOSIS — L8990 Pressure ulcer of unspecified site, unspecified stage: Secondary | ICD-10-CM | POA: Diagnosis present

## 2022-09-07 DIAGNOSIS — R590 Localized enlarged lymph nodes: Secondary | ICD-10-CM | POA: Diagnosis present

## 2022-09-07 DIAGNOSIS — I482 Chronic atrial fibrillation, unspecified: Secondary | ICD-10-CM | POA: Diagnosis not present

## 2022-09-07 DIAGNOSIS — C7B8 Other secondary neuroendocrine tumors: Secondary | ICD-10-CM | POA: Diagnosis present

## 2022-09-07 DIAGNOSIS — R042 Hemoptysis: Secondary | ICD-10-CM | POA: Diagnosis present

## 2022-09-07 DIAGNOSIS — F419 Anxiety disorder, unspecified: Secondary | ICD-10-CM | POA: Diagnosis present

## 2022-09-07 DIAGNOSIS — Z1152 Encounter for screening for COVID-19: Secondary | ICD-10-CM

## 2022-09-07 DIAGNOSIS — S91302A Unspecified open wound, left foot, initial encounter: Secondary | ICD-10-CM | POA: Diagnosis present

## 2022-09-07 DIAGNOSIS — M1990 Unspecified osteoarthritis, unspecified site: Secondary | ICD-10-CM | POA: Diagnosis present

## 2022-09-07 DIAGNOSIS — R188 Other ascites: Secondary | ICD-10-CM | POA: Diagnosis present

## 2022-09-07 DIAGNOSIS — I878 Other specified disorders of veins: Secondary | ICD-10-CM | POA: Diagnosis present

## 2022-09-07 DIAGNOSIS — E876 Hypokalemia: Secondary | ICD-10-CM | POA: Diagnosis present

## 2022-09-07 DIAGNOSIS — I5033 Acute on chronic diastolic (congestive) heart failure: Secondary | ICD-10-CM | POA: Diagnosis present

## 2022-09-07 DIAGNOSIS — Z9981 Dependence on supplemental oxygen: Secondary | ICD-10-CM

## 2022-09-07 DIAGNOSIS — E785 Hyperlipidemia, unspecified: Secondary | ICD-10-CM | POA: Diagnosis present

## 2022-09-07 DIAGNOSIS — S91301A Unspecified open wound, right foot, initial encounter: Secondary | ICD-10-CM | POA: Diagnosis present

## 2022-09-07 DIAGNOSIS — Z85118 Personal history of other malignant neoplasm of bronchus and lung: Secondary | ICD-10-CM

## 2022-09-07 DIAGNOSIS — I9589 Other hypotension: Secondary | ICD-10-CM | POA: Diagnosis present

## 2022-09-07 DIAGNOSIS — J690 Pneumonitis due to inhalation of food and vomit: Principal | ICD-10-CM

## 2022-09-07 DIAGNOSIS — L89156 Pressure-induced deep tissue damage of sacral region: Secondary | ICD-10-CM | POA: Diagnosis present

## 2022-09-07 DIAGNOSIS — G4730 Sleep apnea, unspecified: Secondary | ICD-10-CM | POA: Diagnosis present

## 2022-09-07 DIAGNOSIS — Z87891 Personal history of nicotine dependence: Secondary | ICD-10-CM

## 2022-09-07 DIAGNOSIS — Z881 Allergy status to other antibiotic agents status: Secondary | ICD-10-CM

## 2022-09-07 DIAGNOSIS — J9621 Acute and chronic respiratory failure with hypoxia: Secondary | ICD-10-CM | POA: Diagnosis present

## 2022-09-07 LAB — CBC
HCT: 35.7 % — ABNORMAL LOW (ref 39.0–52.0)
HCT: 33.7 % — ABNORMAL LOW (ref 39.0–52.0)
Hemoglobin: 10.9 g/dL — ABNORMAL LOW (ref 13.0–17.0)
Hemoglobin: 10.3 g/dL — ABNORMAL LOW (ref 13.0–17.0)
MCH: 28.2 pg (ref 26.0–34.0)
MCH: 28.3 pg (ref 26.0–34.0)
MCHC: 30.5 g/dL (ref 30.0–36.0)
MCHC: 30.6 g/dL (ref 30.0–36.0)
MCV: 92.5 fL (ref 80.0–100.0)
MCV: 92.6 fL (ref 80.0–100.0)
Platelets: 164 10*3/uL (ref 150–400)
Platelets: 149 10*3/uL — ABNORMAL LOW (ref 150–400)
RBC: 3.86 MIL/uL — ABNORMAL LOW (ref 4.22–5.81)
RBC: 3.64 MIL/uL — ABNORMAL LOW (ref 4.22–5.81)
RDW: 18.6 % — ABNORMAL HIGH (ref 11.5–15.5)
RDW: 18.6 % — ABNORMAL HIGH (ref 11.5–15.5)
WBC: 10.1 10*3/uL (ref 4.0–10.5)
WBC: 8.3 10*3/uL (ref 4.0–10.5)
nRBC: 0.2 % (ref 0.0–0.2)
nRBC: 0 % (ref 0.0–0.2)

## 2022-09-07 LAB — BASIC METABOLIC PANEL
Anion gap: 10 (ref 5–15)
BUN: 28 mg/dL — ABNORMAL HIGH (ref 8–23)
CO2: 33 mmol/L — ABNORMAL HIGH (ref 22–32)
Calcium: 8.1 mg/dL — ABNORMAL LOW (ref 8.9–10.3)
Chloride: 93 mmol/L — ABNORMAL LOW (ref 98–111)
Creatinine, Ser: 1.3 mg/dL — ABNORMAL HIGH (ref 0.61–1.24)
GFR, Estimated: 56 mL/min — ABNORMAL LOW (ref >60)
Glucose, Bld: 111 mg/dL — ABNORMAL HIGH (ref 70–99)
Potassium: 3.6 mmol/L (ref 3.5–5.1)
Sodium: 136 mmol/L (ref 135–145)

## 2022-09-07 LAB — PROTIME-INR
INR: 3.4 — ABNORMAL HIGH (ref 0.8–1.2)
Prothrombin Time: 34.3 seconds — ABNORMAL HIGH (ref 11.4–15.2)

## 2022-09-07 LAB — TYPE AND SCREEN
ABO/RH(D): O NEG
Antibody Screen: NEGATIVE

## 2022-09-07 LAB — BRAIN NATRIURETIC PEPTIDE: B Natriuretic Peptide: 1220.8 pg/mL — ABNORMAL HIGH (ref 0.0–100.0)

## 2022-09-07 LAB — LACTIC ACID, PLASMA: Lactic Acid, Venous: 1.5 mmol/L (ref 0.5–1.9)

## 2022-09-07 MED ORDER — ACETAMINOPHEN 650 MG RE SUPP: 650.0000 mg | Freq: Four times a day (QID) | RECTAL | Status: DC | PRN

## 2022-09-07 MED ORDER — ROSUVASTATIN CALCIUM 10 MG PO TABS
10.0000 mg | ORAL_TABLET | Freq: Every day | ORAL | Status: DC
  Administered 2022-09-08 – 2022-09-15 (×8): 10 mg via ORAL
  Filled 2022-09-07 (×9): qty 1

## 2022-09-07 MED ORDER — FERROUS SULFATE 325 (65 FE) MG PO TABS
325.0000 mg | ORAL_TABLET | Freq: Every day | ORAL | Status: DC
  Administered 2022-09-08 – 2022-09-15 (×8): 325 mg via ORAL
  Filled 2022-09-07 (×8): qty 1

## 2022-09-07 MED ORDER — GABAPENTIN 300 MG PO CAPS
300.0000 mg | ORAL_CAPSULE | Freq: Three times a day (TID) | ORAL | Status: DC
  Administered 2022-09-07 – 2022-09-16 (×26): 300 mg via ORAL
  Filled 2022-09-07 (×26): qty 1

## 2022-09-07 MED ORDER — RIVAROXABAN 15 MG PO TABS
15.0000 mg | ORAL_TABLET | Freq: Every day | ORAL | Status: DC
  Administered 2022-09-08 – 2022-09-14 (×7): 15 mg via ORAL
  Filled 2022-09-07 (×8): qty 1

## 2022-09-07 MED ORDER — TIOTROPIUM BROMIDE MONOHYDRATE 18 MCG IN CAPS
18.0000 ug | ORAL_CAPSULE | Freq: Every day | RESPIRATORY_TRACT | Status: DC
  Administered 2022-09-09 – 2022-09-16 (×8): 18 ug via RESPIRATORY_TRACT
  Filled 2022-09-07 (×3): qty 5

## 2022-09-07 MED ORDER — METOPROLOL SUCCINATE ER 25 MG PO TB24: 12.5000 mg | ORAL_TABLET | Freq: Every day | ORAL | Status: DC

## 2022-09-07 MED ORDER — LEVOTHYROXINE SODIUM 175 MCG PO TABS
175.0000 ug | ORAL_TABLET | ORAL | Status: DC
  Administered 2022-09-09 – 2022-09-16 (×7): 175 ug via ORAL
  Filled 2022-09-07 (×8): qty 1

## 2022-09-07 MED ORDER — ACETAMINOPHEN 325 MG PO TABS: 650.0000 mg | ORAL_TABLET | Freq: Four times a day (QID) | ORAL | Status: DC | PRN

## 2022-09-07 MED ORDER — TRANEXAMIC ACID FOR INHALATION
500.0000 mg | Freq: Three times a day (TID) | RESPIRATORY_TRACT | Status: AC
  Administered 2022-09-07: 500 mg via RESPIRATORY_TRACT
  Filled 2022-09-07: qty 10

## 2022-09-07 MED ORDER — ONDANSETRON HCL 4 MG PO TABS: 4.0000 mg | ORAL_TABLET | Freq: Four times a day (QID) | ORAL | Status: DC | PRN

## 2022-09-07 MED ORDER — METOPROLOL SUCCINATE ER 25 MG PO TB24
12.5000 mg | ORAL_TABLET | Freq: Every day | ORAL | Status: DC
  Administered 2022-09-09 – 2022-09-15 (×6): 12.5 mg via ORAL
  Filled 2022-09-07 (×6): qty 1

## 2022-09-07 MED ORDER — SODIUM CHLORIDE 0.9 % IV SOLN
1.0000 g | INTRAVENOUS | Status: DC
  Administered 2022-09-07: 1 g via INTRAVENOUS
  Filled 2022-09-07 (×2): qty 10

## 2022-09-07 MED ORDER — MIDODRINE HCL 5 MG PO TABS
5.0000 mg | ORAL_TABLET | Freq: Three times a day (TID) | ORAL | Status: DC
  Administered 2022-09-07 – 2022-09-16 (×26): 5 mg via ORAL
  Filled 2022-09-07 (×26): qty 1

## 2022-09-07 MED ORDER — RIVAROXABAN 15 MG PO TABS: 15.0000 mg | ORAL_TABLET | Freq: Every day | ORAL | 0 refills | Status: DC

## 2022-09-07 MED ORDER — FUROSEMIDE 10 MG/ML IJ SOLN
40.0000 mg | Freq: Once | INTRAMUSCULAR | Status: AC
  Administered 2022-09-07: 40 mg via INTRAVENOUS
  Filled 2022-09-07: qty 4

## 2022-09-07 MED ORDER — LEVOTHYROXINE SODIUM 50 MCG PO TABS: 175.0000 ug | ORAL_TABLET | Freq: Every day | ORAL | Status: DC

## 2022-09-07 MED ORDER — POLYETHYLENE GLYCOL 3350 17 G PO PACK: 17.0000 g | PACK | Freq: Every day | ORAL | Status: DC | PRN

## 2022-09-07 MED ORDER — PIPERACILLIN-TAZOBACTAM 3.375 G IVPB 30 MIN
3.3750 g | Freq: Once | INTRAVENOUS | Status: DC
  Administered 2022-09-07: 3.375 g via INTRAVENOUS
  Filled 2022-09-07: qty 50

## 2022-09-07 MED ORDER — SODIUM CHLORIDE 0.9 % IV BOLUS
1000.0000 mL | Freq: Once | INTRAVENOUS | Status: AC
  Administered 2022-09-07: 1000 mL via INTRAVENOUS

## 2022-09-07 MED ORDER — SODIUM CHLORIDE 0.9 % IV SOLN
500.0000 mg | INTRAVENOUS | Status: DC
  Administered 2022-09-07: 500 mg via INTRAVENOUS
  Filled 2022-09-07 (×2): qty 5

## 2022-09-07 MED ORDER — ONDANSETRON HCL 4 MG/2ML IJ SOLN: 4.0000 mg | Freq: Four times a day (QID) | INTRAMUSCULAR | Status: DC | PRN

## 2022-09-07 MED ORDER — SERTRALINE HCL 50 MG PO TABS
150.0000 mg | ORAL_TABLET | Freq: Every day | ORAL | Status: DC
  Administered 2022-09-08 – 2022-09-16 (×9): 150 mg via ORAL
  Filled 2022-09-07 (×9): qty 3

## 2022-09-07 NOTE — ED Triage Notes (Signed)
[facility] reports pt coming from [facility] for vomiting blood. Pt states he is just coughing it up some. EMS reports pt has been wearing o2 since his DC from hospital recently. Pt denies any pain or discomfort.

## 2022-09-07 NOTE — Assessment & Plan Note (Signed)
Patient appears slightly hypovolemic on examination and this is reflected on his BNP today that is lower compared to 2 days prior.  He has received 1 L in the ED so far.    - Hold further fluids at this time

## 2022-09-07 NOTE — Progress Notes (Addendum)
  Interval Update Note  Patient evaluated at bedside due to increasing oxygen requirements.  On evaluation, patient is awake and alert, declines any concerns at this time including shortness of breath.  Difficulty obtaining a good pleth with pulse oximetry, however RN was able to obtain on right earlobe.  At that time, oxygen saturation was 85% on 4 L.  Patient was subsequently switched to nonrebreather with improvement of oxygen saturation to 90%.  RT was contacted for placement on heated high flow.  # Acute on chronic hypoxic respiratory failure Likely secondary to acute pulmonary edema as patient has received a total of 1.35 L today.  Symptoms began shortly after receiving IV antibiotics, at which time he received 350 cc.  Unfortunately patient has both heart failure and decompensated cirrhosis, both placing him at high risk of hypervolemia.  Updated patient's step-son, Robert, regarding change in status. Herbie [place] expressed understanding and will gather the family tomorrow to have a goals of care discussion with patient.   - IV Lasix 40 mg IV once - Transfer to progressive unit - Telemetry monitoring - Strict in and out - Continue to HFNC.  Wean as tolerated - Consider cardiology +/- palliative consult    Author: Jose Persia, MD 09/07/2022 7:07 PM  For on call review [email or web address].

## 2022-09-07 NOTE — ED Notes (Signed)
Nurse in room to change patient linens and brief. Louis Sanchez, Louis increased to 4L. Patient not oxygenating well. Patient Louis saturation increased to 93%. Dr. Charleen Kirks notified.

## 2022-09-07 NOTE — Assessment & Plan Note (Addendum)
Back to baseline.   He was previously on oxygen via heated humidified HFNC. Now tolerating 2 L/min oxygen via  which is his baseline

## 2022-09-07 NOTE — Assessment & Plan Note (Addendum)
Continue home midodrine.

## 2022-09-07 NOTE — Consult Note (Signed)
PHARMACY -  BRIEF ANTIBIOTIC NOTE   Pharmacy has received consult(s) for Zosyn dosing for aspiration pneumonia from an ED provider.  The patient's profile has been reviewed for ht/wt/allergies/indication/available labs.    One time order(s) placed for Zosyn 3.375 g IV x 1  Further antibiotics/pharmacy consults should be ordered by admitting physician if indicated.                       Thank you, Lorin Picket, PharmD 09/07/2022  12:32 PM

## 2022-09-07 NOTE — ED Provider Notes (Signed)
[facility] Provider Note    Event Date/Time   First MD Initiated Contact with Patient 09/07/22 [phone]     (approximate)   History   Hematemesis  HPI  Louis Sanchez is a 82 y.o. male reports that he began having a small amount of just slight spitting up with a little bit of blood in the since morning  Reports nothing to eat since yesterday.  Reports he is not short of breath no trouble breathing.  No abdominal pain.  He has not been vomiting.  He just been seeing a small amount of spit up as he describes it with a little bit of blood in his mouth.  Nothing else.  No bloody stool.  No stomach pain.  Currently living at home place.  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      Excl. in [place]?    Vitals:   09/08/22 0900 09/08/22 1037  BP: (!) 93/57 (!) 95/55  Pulse: 70 76  Resp: 17 20  Temp:  97.7 F (36.5 C)  SpO2: 97% 97%     Most recent vital signs: Vitals:   09/08/22 0900 09/08/22 1037  BP: (!) 93/57 (!) 95/55  Pulse: 70 76  Resp: 17 20  Temp:  97.7 F (36.5 C)  SpO2: 97% 97%     General: Awake, no distress.  Oriented and pleasant. CV:  Good peripheral perfusion.  Normal tones slightly irregularity in rate Resp:  Normal effort.  Clear bilaterally.  Normal work of breathing.  Denies dyspnea.  Oxygen saturation 98%  on room air.  Evidently recently had been started on home oxygen, but at present no hypoxia even on room air.Abd:  No distention.  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  Very mild edema in the feet bilaterally.  Unwrapped his wet-to-dry dressings, and he has a slight serous fluid coming from what appears to be healing and superficial sores or ulcers overlying the dorsal surface of both feet.  There is no surrounding erythema or redness.  There is no purulent drainage.   ED Results / Procedures / Treatments   Labs (all labs ordered are listed, but only abnormal results are displayed) Labs Reviewed  CBC - Abnormal; Notable for the following components:      Result Value   RBC 3.86 (*)    Hemoglobin 10.9 (*)    HCT 35.7 (*)    RDW 18.6 (*)    All other components within normal limits  BASIC METABOLIC PANEL - Abnormal; Notable for the  following components:   Chloride 93 (*)    CO2 33 (*)    Glucose, Bld 111 (*)    BUN 28 (*)    Creatinine, Ser 1.30 (*)    Calcium 8.1 (*)    GFR, Estimated 56 (*)    All other components within normal limits  PROTIME-INR - Abnormal; Notable for the following components:   Prothrombin Time 34.3 (*)    INR 3.4 (*)    All other components within normal limits  CBC - Abnormal; Notable for the following components:   RBC 3.64 (*)    Hemoglobin 10.3 (*)    HCT 33.7 (*)    RDW 18.6 (*)    Platelets 149 (*)    All other components within normal limits  BRAIN NATRIURETIC PEPTIDE - Abnormal; Notable for the following components:   B Natriuretic Peptide 1,220.8 (*)    All other components within normal limits  PROTIME-INR - Abnormal; Notable for the following components:   Prothrombin Time 23.1 (*)    INR 2.1 (*)    All other components within normal limits  CBC - Abnormal; Notable for the following components:   RBC 3.46 (*)    Hemoglobin 9.7 (*)    HCT 32.5 (*)    MCHC 29.8 (*)    RDW 18.7 (*)    Platelets 136  (*)    All other components within normal limits  BASIC METABOLIC PANEL - Abnormal; Notable for the following components:   Potassium 2.8 (*)    Chloride 95 (*)    BUN 24 (*)    Calcium 7.6 (*)    All other components within normal limits  CULTURE, BLOOD (ROUTINE X 2)  CULTURE, BLOOD (ROUTINE X 2)  RESP PANEL BY RT-PCR (RSV, FLU A&B, COVID)  RVPGX2  EXPECTORATED SPUTUM ASSESSMENT W GRAM STAIN, RFLX TO RESP C  RESPIRATORY PANEL BY PCR  MRSA NEXT GEN BY PCR, NASAL  CULTURE, RESPIRATORY W GRAM STAIN  ACID FAST SMEAR (AFB, MYCOBACTERIA)  ACID FAST CULTURE WITH REFLEXED SENSITIVITIES (MYCOBACTERIA)  LACTIC ACID, PLASMA  MAGNESIUM  PHOSPHORUS  STREP PNEUMONIAE URINARY ANTIGEN  C-REACTIVE PROTEIN  TYPE AND SCREEN     EKG  Interval me at 9:45 AM heart rate 70 QRS 90 QTc 600 Atrial fibrillation is rhythm, no evidence of frank ischemia.  Rate is controlled.  Mild nonspecific T wave flattening throughout   RADIOLOGY     CT Chest Wo Contrast  Result Date: 09/07/2022 CLINICAL DATA:  Aspiration. Coughing. Hemoptysis. History of lung cancer. EXAM: CT CHEST WITHOUT CONTRAST TECHNIQUE: Multidetector CT imaging of the chest was performed following the standard protocol without IV contrast. RADIATION DOSE REDUCTION: This exam was performed according to the departmental dose-optimization program which includes automated exposure control, adjustment of the mA and/or kV according to patient size and/or use of iterative reconstruction technique. COMPARISON:  Chest x-ray 09/07/2022 earlier. Old CT scan 08/29/2022 and older FINDINGS: Cardiovascular: Heart is large with significant multifocal chamber enlargement. Stable pericardial effusion, small. Coronary artery calcifications are seen. On this non IV contrast exam, the aorta has a normal course and caliber overall with scattered vascular calcifications. Only mild ectasia of the ascending aorta approaching 4 cm. Mediastinum/Nodes: No specific abnormal  lymph node enlargement identified in the axillary regions, hilum. There is some prominent mediastinal nodes once again identified. Example left of the arch on series 2, image 44 measuring 16 by 11 mm, unchanged. Normal caliber thoracic esophagus. Small thyroid. Prominent right supraclavicular nodes are  again seen. Lungs/Pleura: Motion. Increasing size of the small left pleural effusion. Adjacent parenchymal lung opacities are increasing at the left lower lobe. Acute infiltrates possible. There are increasing areas of patchy confluent ground-glass in the left upper lobe as well superiorly both anterior and posterior. Acute infiltrates possible. The opacity seen previously in the right lower posterior lung is also increasing and becoming more confluent with air bronchograms. Also persistent interstitial changes thickening of the right upper lung. Underlying emphysematous changes. There is a loculated right-sided pleural effusion posteroinferior with pleural thickening and calcification which is stable. Surgical changes along the right lung from right lower lobectomy Upper Abdomen: Ascites in the upper abdomen. Mesenteric stranding. There is significant artifact from the arms being scanned at the patient's side. There is some nodular thickening in the area of the left adrenal gland, unchanged from prior. Please correlate with any prior workup. Musculoskeletal: Anasarca. Motion. Curvature and degenerative changes along the spine. Fixation hardware along the lower cervical spine. IMPRESSION: Compared to the most recent chest CT scan there are increasing areas of parenchymal opacity bilaterally particularly left lower and upper lobe but also areas in the right lung. Acute infiltrates possible. Recommend follow-up. Increasing small left pleural effusion. Stable loculated right effusion with pleural thickening. Markedly enlarged heart. Increasing anasarca. Component of lung edema are not excluded. Stable prominent to mildly  enlarged supraclavicular right-sided nodes and mediastinal nodes. Attention on follow-up Aortic Atherosclerosis (ICD10-I70.0) and Emphysema (ICD10-J43.9). Electronically Signed   By: Jill Side M.D.   On: 09/07/2022 11:38   DG Chest 2 View  Result Date: 09/07/2022 CLINICAL DATA:  Hemoptysis versus upper GI bleeding EXAM: CHEST - 2 VIEW COMPARISON:  CT chest dated 08/29/2022 FINDINGS: Status post right lower lobectomy. Patchy/masslike right lower lung opacity with additional patchy left lower lobe opacities, better evaluated on recent CT, favoring multifocal pneumonia, possibly on the basis of aspiration. Small to moderate loculated right pleural effusion, chronic. Small left pleural effusion. No frank interstitial edema.  No pneumothorax. Cardiomegaly. Cervical spine fixation hardware. IMPRESSION: Status post right lower lobectomy. Multifocal patchy lower lung opacities, right greater than left, favoring multifocal pneumonia, possibly on the basis of aspiration. This is better evaluated on recent CT. Follow-up chest radiographs are suggested in 4-6 weeks to document clearance. Small to moderate loculated right pleural effusion, chronic. Small left pleural effusion. No frank interstitial edema. Electronically Signed   By: Julian Hy M.D.   On: 09/07/2022 10:45     PROCEDURES:  Critical Care performed: No  Procedures   MEDICATIONS ORDERED IN ED: Medications  acetaminophen (TYLENOL) tablet 650 mg (has no administration in time range)    Or  acetaminophen (TYLENOL) suppository 650 mg (has no administration in time range)  polyethylene glycol (MIRALAX / GLYCOLAX) packet 17 g (has no administration in time range)  ondansetron (ZOFRAN) tablet 4 mg (has no administration in time range)    Or  ondansetron (ZOFRAN) injection 4 mg (has no administration in time range)  midodrine (PROAMATINE) tablet 5 mg (5 mg Oral Given 09/08/22 1042)  gabapentin (NEURONTIN) capsule 300 mg (300 mg Oral Given  09/08/22 1042)  ferrous sulfate tablet 325 mg (325 mg Oral Not Given 09/08/22 0049)  Rivaroxaban (XARELTO) tablet 15 mg (0 mg Oral Hold 09/07/22 1529)  rosuvastatin (CRESTOR) tablet 10 mg (10 mg Oral Not Given 09/08/22 0049)  sertraline (ZOLOFT) tablet 150 mg (150 mg Oral Given 09/08/22 1042)  tiotropium (SPIRIVA) inhalation capsule ([facility] use ONLY) 18 mcg (18 mcg Inhalation Not Given  09/08/22 1000)  levothyroxine (SYNTHROID) tablet 175 mcg (175 mcg Oral Not Given 09/08/22 0508)  cefTRIAXone (ROCEPHIN) 1 g in sodium chloride 0.9 % 100 mL IVPB (0 g Intravenous Stopped 09/07/22 1919)  azithromycin (ZITHROMAX) 500 mg in sodium chloride 0.9 % 250 mL IVPB (0 mg Intravenous Stopped 09/07/22 1852)  metoprolol succinate (TOPROL-XL) 24 hr tablet 12.5 mg (has no administration in time range)  potassium chloride SA (KLOR-CON M) CR tablet 40 mEq (40 mEq Oral Given 09/08/22 1042)  sodium chloride 0.9 % bolus 1,000 mL (0 mLs Intravenous Stopped 09/07/22 1340)  tranexamic acid (CYKLOKAPRON) 1000 MG/10ML nebulizer solution 500 mg (500 mg Nebulization Given 09/07/22 1734)  furosemide (LASIX) injection 40 mg (40 mg Intravenous Given 09/07/22 2004)     IMPRESSION / MDM / ASSESSMENT AND PLAN / ED COURSE  I reviewed the triage vital signs and the nursing notes.                              Differential diagnosis includes, but is not limited to, possible hemoptysis, esophagitis,  Patient's presentation is most consistent with acute complicated illness / injury requiring diagnostic workup.   The patient is on the cardiac monitor to evaluate for evidence of arrhythmia and/or significant heart rate changes.  Differential diagnosis is somewhat broad but given the patient's recent admission, review of his previous discharge is possible the patient may have small amount of pulmonary hemorrhage, pulmonary edema, pneumonia, etc. these are high on the differential.  Will check labs including CBC etc.  INR is somewhat unexpectedly  elevated, he is on anticoagulation but INR greater than 3.  Discussed with our pharmacist and we did noted that the patient dose of anticoagulant is slightly high for his renal clearance.       ----------------------------------------- 12:48 PM on 09/07/2022 ----------------------------------------- Patient had a brief drop in blood pressure to 82 systolic, however on awakening the patient from rest his blood pressure is 92/67 and he alerts to voice follows commands, tells me he feels fine and does not have any complaints.  Denies feeling short of breath.  He is hypotensive though in the setting of his recent pneumonia and review of CT findings I am concerned about potential worsening infection, sepsis, pulmonary edema, pulmonary hemorrhage, etc.  I have consulted with and the patient is currently being seen by Dr. Charleen Kirks. Paged pulmonary for consult, no return page yet.  Initiating fluid bolus, also initiating Zosyn for antibiotic coverage.  At this juncture that I am not certain of sepsis as the necessary cause, but I would also consider his differential but CHF, pulmonary hemorrhage, or other causation's may be at play.  Given his history of documented essential hypertension and now presenting with significant hypotension I do believe a fluid bolus is warranted and has been written for.  Will continue to follow the patient closely and anticipate admission to the hospitalist service.  I have requested pulmonary consult, but still awaiting page back from physician.      FINAL CLINICAL IMPRESSION(S) / ED DIAGNOSES   Final diagnoses:  Aspiration pneumonia of both lungs, unspecified aspiration pneumonia type, unspecified part of lung ([place])     Rx / DC Orders   ED Discharge Orders          Ordered    rivaroxaban (XARELTO) 15 MG TABS tablet  Daily with supper        09/07/22 1059  Note:  This document was prepared using Dragon voice recognition software and may  include unintentional dictation errors.   Delman Kitten, MD 09/08/22 1323

## 2022-09-07 NOTE — Assessment & Plan Note (Addendum)
Compensated. Continue Lasix and Aldactone

## 2022-09-07 NOTE — Discharge Instructions (Signed)
After reviewing, it appears that you should be on a slightly decreased dose of rivaroxaban.  I have called in a prescription for you for a lower dose at 15 mg daily.  Please discontinue the 20 mg rivaroxaban tablet.  You will need to follow-up with your facility or primary care doctor to see to receive additional prescription for this new dose adjustment.

## 2022-09-07 NOTE — Assessment & Plan Note (Addendum)
Patient presenting with a couple day history of bright red small specks mixed in sputum intermittently.  This is in the setting of increased cough over the last several days.  CT chest obtained in the ED also demonstrates increasing opacities.   Resolved. Seen by pulmonology.  Attributed to having nebulized TXA prior to occurrence. No further workup recommended.   Influenza, RSV and COVID tests were negative.  Briefly treated with antibiotics for ?pneumonia which is felt unlikely.  Pt stable off antibiotics and having undergone diuresis

## 2022-09-07 NOTE — H&P (Addendum)
History and Physical    Patient: Louis Sanchez EYC:[id number] DOB: 1940/10/30 DOA: 09/07/2022 DOS: the patient was seen and examined on 09/07/2022 PCP: Adin Hector, MD  Patient coming from: SNF  Chief Complaint:  Chief Complaint  Patient presents with   Hematemesis   HPI: Louis Sanchez is a 82 y.o. 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  Louis Sanchez states that over the last 1 to 2 days, he has noticed little specks of bright red blood in his sputum.  It does not occur every time he coughs.  He denies any frank bloody sputum.  He denies any shortness of breath and states that his breathing is at its baseline.  He notes that his cough has been mild and present for the last several days.  He denies any nausea, vomiting, diarrhea or abdominal pain.  He denies any fever, chills, chest pain, palpitations.  ED course: On arrival to the ED, patient's blood pressure was 106/44 with heart rate of 66.  He was saturating at 100% on his home 2 L.  Initial workup notable for hemoglobin of 10.3, platelets of 149, INR of 3.4, lactic acid of 1.5, BNP of 1220, creatinine 1.30, and GFR 56. CT of the chest was obtained that demonstrated increasing areas of parenchymal opacity bilaterally that could possibly be acute.  Increasing small left pleural effusion stable loculated right effusion, in addition to increasing anasarca.  Due to hemoptysis and hypotension, [facility] contacted for admission.  Review of Systems: As mentioned in the history of present illness. All other systems reviewed and are negative.  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 His smoking use included cigarettes. He has never used smokeless tobacco. He reports that he does not currently use alcohol after a past usage of about 15.0 standard drinks of alcohol per week. He reports that he does not use drugs.  Allergies  Allergen Reactions   Atorvastatin     Other reaction(s): Muscle Pain Other reaction(s): Muscle Pain Other reaction(s): Muscle Pain Other reaction(s): Muscle Pain Other reaction(s): Muscle Pain    Levofloxacin Rash    Family History  Problem Relation Age of Onset   Alzheimer's disease Mother     Prior to Admission medications   Medication Sig Start Date End Date Taking? Authorizing Provider  Ferrous Sulfate (IRON PO) Take 325 mg by mouth daily.    [provider]  furosemide (LASIX) 40 MG tablet Take 1 tablet (40 mg total) by mouth 2 (two) times daily. 08/10/22   Loletha Grayer, MD  gabapentin (NEURONTIN) 300 MG capsule Take 1 capsule (300 mg total) by mouth 3 (three) times daily. 08/10/22   Loletha Grayer, MD  levothyroxine (SYNTHROID) 175 MCG tablet Take 175 mcg by mouth daily before breakfast. Every day but Sunday 12/30/20   [provider]  metoprolol succinate (TOPROL XL) 25 MG 24 hr tablet Take 0.5 tablets (12.5 mg total) by mouth at bedtime. Patient  taking differently: Take 25 mg by mouth at bedtime. 08/10/22   Loletha Grayer, MD  Multiple Vitamin (MULTIVITAMIN) tablet Take 1 tablet by mouth daily.    [provider]  nitroGLYCERIN (NITROSTAT) 0.4 MG SL tablet Place under the tongue. 09/01/20   [provider]  rivaroxaban (XARELTO) 15 MG TABS tablet Take 1 tablet (15 mg total) by mouth daily with supper. 09/07/22   Delman Kitten, MD  rosuvastatin (CRESTOR) 10 MG tablet Take 10 mg by mouth daily.    [provider]  sertraline (ZOLOFT) 100 MG tablet Take 1.5 tablets by mouth daily. 01/21/13   [provider]  spironolactone (ALDACTONE) 25 MG tablet Take 1 tablet (25 mg total) by mouth daily. 08/11/22   Loletha Grayer, MD  tiotropium (SPIRIVA) 18 MCG inhalation capsule Place 18 mcg into inhaler and inhale daily.    [provider]    Physical Exam: Vitals:   09/07/22 1341 09/07/22 1342 09/07/22 1343 09/07/22 1345  BP:    (!) 90/57  Pulse: 67  (!) 54 60  Resp: 12 11 12 12   Temp:      TempSrc:      SpO2: 100%  97% 97%   Physical Exam Vitals and nursing note reviewed.  Constitutional:      General: He is not in acute distress.    Appearance: He is normal weight. He is not toxic-appearing.  HENT:     Head: Normocephalic and atraumatic.     Mouth/Throat:     Mouth: Mucous membranes are dry.     Pharynx: Oropharynx is clear.  Eyes:     General: No scleral icterus.    Conjunctiva/sclera: Conjunctivae normal.     Pupils: Pupils are equal, round, and reactive to light.  Cardiovascular:     Rate and Rhythm: Normal rate and regular rhythm.     Heart sounds: No murmur heard.    No gallop.  Pulmonary:     Effort: Pulmonary effort is normal. No respiratory distress.     Breath sounds: Rales (Bibasilar and up to mid lung fields bilaterally) present. No wheezing or rhonchi.  Abdominal:     General: Bowel sounds are normal.     Palpations: Abdomen is soft.  Musculoskeletal:     Cervical back:  Neck supple.     Right lower leg: No edema.     Left lower leg: No edema.  Skin:    General: Skin is warm and dry.  Neurological:     Mental Status: He is alert and oriented to person, place, and time.     Motor: No weakness.     Comments: Lethargic but wakes easily to voice.   Psychiatric:        Mood and Affect: Mood normal.        Behavior: Behavior normal.    Data Reviewed: CBC with  WBC of 10.1, hemoglobin of 10.9, and platelets of 164 Repeat CBC with WBC of 8.3, hemoglobin of 10.3 and platelets of 149 BMP with sodium of 136, potassium 3.6, chloride 93, bicarb 33, glucose 111, BUN 28, creatinine 1.30 and GFR 56 BNP increased at 1220 Lactic acid within normal limits at 1.5 INR elevated at 3.4  EKG personally reviewed.  Atrial fibrillation with rate of 73.  QTc prolongation noted.  Compared to EKG obtained on 08/29/2022, QT is more prolonged, however question the accuracy as waves are difficult to visualize.  CT Chest Wo Contrast  Result Date: 09/07/2022 CLINICAL DATA:  Aspiration. Coughing. Hemoptysis. History of lung cancer. EXAM: CT CHEST WITHOUT CONTRAST TECHNIQUE: Multidetector CT imaging of the chest was performed following the standard protocol without IV contrast. RADIATION DOSE REDUCTION: This exam was performed according to the departmental dose-optimization program which includes automated exposure control, adjustment of the mA and/or kV according to patient size and/or use of iterative reconstruction technique. COMPARISON:  Chest x-ray 09/07/2022 earlier. Old CT scan 08/29/2022 and older FINDINGS: Cardiovascular: Heart is large with significant multifocal chamber enlargement. Stable pericardial effusion, small. Coronary artery calcifications are seen. On this non IV contrast exam, the aorta has a normal course and caliber overall with scattered vascular calcifications. Only mild ectasia of the ascending aorta approaching 4 cm. Mediastinum/Nodes: No specific abnormal lymph node  enlargement identified in the axillary regions, hilum. There is some prominent mediastinal nodes once again identified. Example left of the arch on series 2, image 44 measuring 16 by 11 mm, unchanged. Normal caliber thoracic esophagus. Small thyroid. Prominent right supraclavicular nodes are again seen. Lungs/Pleura: Motion. Increasing size of the small left pleural effusion. Adjacent parenchymal lung opacities are increasing at the left lower lobe. Acute infiltrates possible. There are increasing areas of patchy confluent ground-glass in the left upper lobe as well superiorly both anterior and posterior. Acute infiltrates possible. The opacity seen previously in the right lower posterior lung is also increasing and becoming more confluent with air bronchograms. Also persistent interstitial changes thickening of the right upper lung. Underlying emphysematous changes. There is a loculated right-sided pleural effusion posteroinferior with pleural thickening and calcification which is stable. Surgical changes along the right lung from right lower lobectomy Upper Abdomen: Ascites in the upper abdomen. Mesenteric stranding. There is significant artifact from the arms being scanned at the patient's side. There is some nodular thickening in the area of the left adrenal gland, unchanged from prior. Please correlate with any prior workup. Musculoskeletal: Anasarca. Motion. Curvature and degenerative changes along the spine. Fixation hardware along the lower cervical spine. IMPRESSION: Compared to the most recent chest CT scan there are increasing areas of parenchymal opacity bilaterally particularly left lower and upper lobe but also areas in the right lung. Acute infiltrates possible. Recommend follow-up. Increasing small left pleural effusion. Stable loculated right effusion with pleural thickening. Markedly enlarged heart. Increasing anasarca. Component of lung edema are not excluded. Stable prominent to mildly enlarged  supraclavicular right-sided nodes and mediastinal nodes. Attention on follow-up Aortic Atherosclerosis (ICD10-I70.0) and Emphysema (ICD10-J43.9). Electronically Signed   By: Jill Side M.D.   On: 09/07/2022 11:38   DG Chest 2 View  Result Date: 09/07/2022 CLINICAL DATA:  Hemoptysis versus upper GI bleeding EXAM: CHEST - 2 VIEW COMPARISON:  CT chest dated 08/29/2022 FINDINGS: Status post right lower lobectomy. Patchy/masslike right lower lung opacity with additional patchy left lower lobe opacities, better evaluated on recent CT, favoring multifocal pneumonia, possibly on the basis  of aspiration. Small to moderate loculated right pleural effusion, chronic. Small left pleural effusion. No frank interstitial edema.  No pneumothorax. Cardiomegaly. Cervical spine fixation hardware. IMPRESSION: Status post right lower lobectomy. Multifocal patchy lower lung opacities, right greater than left, favoring multifocal pneumonia, possibly on the basis of aspiration. This is better evaluated on recent CT. Follow-up chest radiographs are suggested in 4-6 weeks to document clearance. Small to moderate loculated right pleural effusion, chronic. Small left pleural effusion. No frank interstitial edema. Electronically Signed   By: Julian Hy M.D.   On: 09/07/2022 10:45    There are no new results to review at this time.  Assessment and Plan:  * Hemoptysis Patient presenting with a couple day history of bright red small specks mixed in sputum intermittently.  This is in the setting of increased cough over the last several days.  CT chest obtained in the ED also demonstrates increasing opacities.  I suspect this may be the natural evolution of CT changes after pneumonia, however will consult with pulmonology.  Differential includes bronchial irritation in the setting of cough.  - Pulmonology consulted; appreciate their recommendations - Monitor sputum while admitted - Monitor CBC while admitted - Guaifenesin and  Tessalon Perles as needed  Hypotension On initial presentation, patient's blood pressure was approximately 106/44 but subsequently decreased to as low as 83/52 before gradually improving.  He received 1 L bolus, however blood pressure did not respond significantly.  He does appear mildly hypovolemic, however I suspect hypotension is in the setting of underlying cirrhosis and continued antihypertensive use.  During most recent hospitalization, patient was requiring midodrine due to hypotension but he was not discharged with this.  - Restart midodrine 5 mg, 3 times daily - Hold home spironolactone and Lasix for today - Continue decreased home metoprolol 12.5 mg - Hold further fluids at this time.  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  CT imaging with evidence of anasarca and ascites.  -Recommend outpatient follow-up with GI  Chronic systolic CHF (congestive heart failure) ([place]) Patient appears slightly hypovolemic on examination and this is reflected on his BNP today that is lower compared to 2 days prior.  He has received 1 L in the ED so far.    - Hold further fluids at this time  Chronic atrial fibrillation [facility]) Patient currently in atrial fibrillation, however rates ranging between low 60s and 70s.  Given hypotension, will decrease home metoprolol.  - Decrease home metoprolol to 12.5 mg daily - Due to decreased GFR, decrease home Xarelto to 15 mg daily  Advance Care Planning:   Code Status: Full Code verified by patient  Consults: Pulmonology  Family Communication: No family at bedside, will update via telephone  Severity of Illness: The appropriate patient status for this  patient is OBSERVATION. Observation status is judged to be reasonable and necessary in order to provide the required intensity of service to ensure the patient's safety. The patient's presenting symptoms, physical exam findings, and initial radiographic and laboratory data in the context of their medical condition is felt to place them at decreased risk for further clinical deterioration. Furthermore, it is anticipated that the patient will be medically stable for discharge from the hospital within 2  midnights of admission.   Author: Jose Persia, MD 09/07/2022 2:08 PM  For on call review [email or web address].

## 2022-09-07 NOTE — Consult Note (Signed)
PULMONOLOGY         Date: 09/07/2022,   MRN# [id number] Louis Sanchez 03-16-1941     Admission                  Current   Referring provider: Dr Charleen Kirks   CHIEF COMPLAINT:   Nonmassive hemoptysis   HISTORY OF PRESENT ILLNESS   This is a 82 year old male with a history of anxiety disorder, arrhythmias, osteoarthritis, benign neoplasm of the colon with history of tubular adenoma, CHF with reduced EF, essential hypertension, malignant neuroendocrine tumor of the liver, thyroid dysfunction with hypothyroidism and sleep apnea who came in with not massive hemoptysis.  In the ED he had CT chest without contrast with findings of loculated right pleural effusion with pleural thickening suggestive of possible empyema as well as acute infiltrates bilaterally and small left pleural effusion.  Additional findings include mediastinal lymphadenopathy including station 4L which is 16 mm in size and right supraclavicular lymphadenopathy.  Pulmonary consultation placed for additional evaluation management.   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 [facility])    Other and unspecified hyperlipidemia 1997   Other specified disorder of male genital organs(608.89) 08/2012   Sleep disturbance, unspecified    Thyroid disease    Unspecified hypothyroidism 2005     SURGICAL HISTORY   Past Surgical History:  Procedure Laterality Date   APPENDECTOMY  1953   colon polyps  2009   [place]   Right   INGUINAL HERNIA REPAIR Right 2014   SPINE SURGERY  1997   neck   WRIST SURGERY        FAMILY HISTORY   Family History  Problem Relation Age of Onset   Alzheimer's disease Mother      SOCIAL HISTORY   Social History   Tobacco Use   Smoking status: Former    Years: 25.00    Types: Cigarettes   Smokeless tobacco: Never  Vaping Use   Vaping Use: Never used  Substance Use Topics   Alcohol use: Not Currently    Alcohol/week: 15.0 standard drinks of alcohol    Types: 15 Standard drinks or equivalent per week   Drug use: No     MEDICATIONS    Home Medication:  Current Outpatient Rx   Order #: [id number] Class: Historical Med   Order #: [id number] Class: Normal   Order #: [id number] Class: Normal   Order #: [id number] Class: Historical Med   Order #: [id number] Class: Normal   Order #: [id number] Class: Historical Med   Order #: [id number] Class: Historical Med   Order #: [id number] Class: Historical Med   Order #: [id number] Class: Historical Med   Order #: [id number] Class: Normal   Order #: [id number] Class: Historical Med   Order #: [id number] Class: Normal    Current Medication:  Current Facility-Administered Medications:    acetaminophen (TYLENOL) tablet 650 mg, 650 mg, Oral, Q6H PRN **OR** acetaminophen (TYLENOL) suppository 650 mg, 650 mg, Rectal, Q6H PRN, Jose Persia, MD   ferrous sulfate tablet 325  mg, 325 mg, Oral, QHS, Jose Persia, MD   gabapentin (NEURONTIN) capsule 300 mg, 300 mg, Oral, TID, Jose Persia, MD, 300 mg at 09/07/22 1510   [START ON 09/08/2022] levothyroxine (SYNTHROID) tablet 175 mcg, 175 mcg, Oral, Once per day on Mon Tue Wed Thu Fri Sat, [place], Kristin A, [facility]   metoprolol succinate (TOPROL-XL) 24 hr tablet 12.5 mg, 12.5 mg, Oral, QHS, Jose Persia, MD   midodrine (PROAMATINE) tablet 5 mg, 5 mg, Oral, TID WC, Jose Persia, MD, 5 mg at 09/07/22 1510   ondansetron (ZOFRAN) tablet 4 mg, 4 mg, Oral, Q6H PRN **OR** ondansetron (ZOFRAN) injection 4 mg, 4 mg, Intravenous, Q6H PRN, Jose Persia, MD   polyethylene glycol (MIRALAX /  GLYCOLAX) packet 17 g, 17 g, Oral, Daily PRN, Jose Persia, MD   Rivaroxaban (XARELTO) tablet 15 mg, 15 mg, Oral, Q supper, Jose Persia, MD   rosuvastatin (CRESTOR) tablet 10 mg, 10 mg, Oral, QHS, Jose Persia, MD   [START ON 09/08/2022] sertraline (ZOLOFT) tablet 150 mg, 150 mg, Oral, Daily, Jose Persia, MD   [START ON 09/08/2022] tiotropium (SPIRIVA) inhalation capsule ([facility] use ONLY) 18 mcg, 18 mcg, Inhalation, Daily, Jose Persia, MD  Current Outpatient Medications:    ferrous sulfate 325 (65 FE) MG tablet, Take 325 mg by mouth at bedtime., Disp: , Rfl:    furosemide (LASIX) 40 MG tablet, Take 1 tablet (40 mg total) by mouth 2 (two) times daily., Disp: 60 tablet, Rfl: 0   gabapentin (NEURONTIN) 300 MG capsule, Take 1 capsule (300 mg total) by mouth 3 (three) times daily., Disp: 90 capsule, Rfl: 0   levothyroxine (SYNTHROID) 175 MCG tablet, Take 175 mcg by mouth See admin instructions. Take 1 tablet (128mcg) by mouth every Monday, Tuesday, Wednesday, Thursday, Friday and Saturday morning before breakfast, Disp: , Rfl:    metoprolol succinate (TOPROL XL) 25 MG 24 hr tablet, Take 0.5 tablets (12.5 mg total) by mouth at bedtime., Disp: 15 tablet, Rfl: 0   Multiple Vitamin (MULTIVITAMIN) tablet, Take 1 tablet by mouth daily., Disp: , Rfl:    nitroGLYCERIN (NITROSTAT) 0.4 MG SL tablet, Place 0.4 mg under the tongue every 5 (five) minutes as needed for chest pain., Disp: , Rfl:    rosuvastatin (CRESTOR) 10 MG tablet, Take 10 mg by mouth at bedtime., Disp: , Rfl:    sertraline (ZOLOFT) 100 MG tablet, Take 150 mg by mouth daily., Disp: , Rfl:    spironolactone (ALDACTONE) 25 MG tablet, Take 1 tablet (25 mg total) by mouth daily., Disp: 30 tablet, Rfl: 0   tiotropium (SPIRIVA) 18 MCG inhalation capsule, Place 18 mcg into inhaler and inhale daily., Disp: , Rfl:    rivaroxaban (XARELTO) 15 MG TABS tablet, Take 1 tablet (15 mg total) by mouth daily with supper., Disp: 30 tablet, Rfl:  0    ALLERGIES   Atorvastatin and Levofloxacin     REVIEW OF SYSTEMS    Review of Systems:  Gen:  Denies  fever, sweats, chills weigh loss  HEENT: Denies blurred vision, double vision, ear pain, eye pain, hearing loss, nose bleeds, sore throat Cardiac:  No dizziness, chest pain or heaviness, chest tightness,edema Resp:   reports dyspnea chronically  Gi: Denies swallowing difficulty, stomach pain, nausea or vomiting, diarrhea, constipation, bowel incontinence Gu:  Denies bladder incontinence, burning urine Ext:   Denies Joint pain, stiffness or swelling Skin: Denies  skin rash, easy bruising or bleeding or hives Endoc:  Denies polyuria, polydipsia , polyphagia or weight change Psych:  Denies depression, insomnia or hallucinations   Other:  All other systems negative   VS: BP 107/65   Pulse (!) 50   Temp 97.9 F (36.6 C)   Resp (!) 21   SpO2 (!) 83%      PHYSICAL EXAM    GENERAL:NAD, no fevers, chills, no weakness no fatigue HEAD: Normocephalic, atraumatic.  EYES: Pupils equal, round, reactive to light. Extraocular muscles intact. No scleral icterus.  MOUTH: Moist mucosal membrane. Dentition intact. No abscess noted.  EAR, NOSE, THROAT: Clear without exudates. No external lesions.  NECK: Supple. No thyromegaly. No nodules. No JVD.  PULMONARY: decreased breath sounds worse on the right CARDIOVASCULAR: S1 and S2. Regular rate and rhythm. No murmurs, rubs, or gallops. No edema. Pedal pulses 2+ bilaterally.  GASTROINTESTINAL: Soft, nontender, nondistended. No masses. Positive bowel sounds. No hepatosplenomegaly.  MUSCULOSKELETAL: No swelling, clubbing, or edema. Range of motion full in all extremities.  NEUROLOGIC: Cranial nerves II through XII are intact. No gross focal neurological deficits. Sensation intact. Reflexes intact.  SKIN: No ulceration, lesions, rashes, or cyanosis. Skin warm and dry. Turgor intact.  PSYCHIATRIC: Mood, affect within normal limits. The  patient is awake, alert and oriented x 3. Insight, judgment intact.       IMAGING     ASSESSMENT/PLAN   Acute hypoxemic respiratory failure - present on admission  - COVID19 /RSV/infulenza panel ordered   - supplemental O2 during my evaluation -room air  -BNP is elevated suggestive of acute on chronic CHF exacerbation  -Respiratory viral panel -Nasal MRSA screen  - empiric Rocephin Zithromax for CAP coverage  -strep pneumoniae ur AG -sputum resp cultures -AFB sputum expectorated specimen -sputum cytology  -reviewed pertinent imaging with patient today - ESR/CRP -PT/OT -chest physiotherapy with flutter and IS -please encourage patient to use incentive spirometer few times each hour while hospitalized.     Loculated right pleural effusion with pleural thickening  - diagnostic and theraputic throacentesis per IR if possible   - continue rocephin , zithromax  Non massive hemoptysis    Nebulized tXA x 1 dose    Acute on chronic systolic CHF     - patient is hypotensive, may be difficult to diurese.  Patient takes midodrine at home    - consider cardiology consult      Multiple comorbid conditions    Consider palliative evaluation , patient is full code     Thank you for allowing me to participate in the care of this patient.   Patient/Family are satisfied with care plan and all questions have been answered.    Provider disclosure: Patient with at least one acute or chronic illness or injury that poses a threat to life or bodily function and is being managed actively during this encounter.  All of the below services have been performed independently by signing provider:  review of prior documentation from internal and or external health records.  Review of previous and current lab results.  Interview and comprehensive assessment during patient visit today. Review of current and previous chest radiographs/CT scans. Discussion of management and test interpretation with  health care team and patient/family.   This document was prepared using Dragon voice recognition software and may include unintentional dictation errors.     Ottie Glazier, M.D.  Division of Pulmonary & Critical Care Medicine

## 2022-09-07 NOTE — Assessment & Plan Note (Signed)
Patient currently in atrial fibrillation, however rates ranging between low 60s and 70s.  Given hypotension, will decrease home metoprolol.  - Decrease home metoprolol to 12.5 mg daily - Due to decreased GFR, decrease home Xarelto to 15 mg daily

## 2022-09-08 ENCOUNTER — Encounter: Payer: Self-pay | Admitting: Internal Medicine

## 2022-09-08 DIAGNOSIS — K7460 Unspecified cirrhosis of liver: Secondary | ICD-10-CM | POA: Diagnosis present

## 2022-09-08 DIAGNOSIS — H5789 Other specified disorders of eye and adnexa: Secondary | ICD-10-CM | POA: Diagnosis not present

## 2022-09-08 DIAGNOSIS — I110 Hypertensive heart disease with heart failure: Secondary | ICD-10-CM | POA: Diagnosis present

## 2022-09-08 DIAGNOSIS — G4730 Sleep apnea, unspecified: Secondary | ICD-10-CM | POA: Diagnosis present

## 2022-09-08 DIAGNOSIS — L89156 Pressure-induced deep tissue damage of sacral region: Secondary | ICD-10-CM | POA: Diagnosis present

## 2022-09-08 DIAGNOSIS — I9589 Other hypotension: Secondary | ICD-10-CM | POA: Diagnosis present

## 2022-09-08 DIAGNOSIS — J9621 Acute and chronic respiratory failure with hypoxia: Secondary | ICD-10-CM

## 2022-09-08 DIAGNOSIS — I482 Chronic atrial fibrillation, unspecified: Secondary | ICD-10-CM | POA: Diagnosis not present

## 2022-09-08 DIAGNOSIS — I5033 Acute on chronic diastolic (congestive) heart failure: Secondary | ICD-10-CM | POA: Diagnosis present

## 2022-09-08 DIAGNOSIS — E785 Hyperlipidemia, unspecified: Secondary | ICD-10-CM | POA: Diagnosis present

## 2022-09-08 DIAGNOSIS — E876 Hypokalemia: Secondary | ICD-10-CM | POA: Diagnosis not present

## 2022-09-08 DIAGNOSIS — M1990 Unspecified osteoarthritis, unspecified site: Secondary | ICD-10-CM | POA: Diagnosis present

## 2022-09-08 DIAGNOSIS — Z1152 Encounter for screening for COVID-19: Secondary | ICD-10-CM | POA: Diagnosis not present

## 2022-09-08 DIAGNOSIS — K219 Gastro-esophageal reflux disease without esophagitis: Secondary | ICD-10-CM | POA: Diagnosis present

## 2022-09-08 DIAGNOSIS — Z66 Do not resuscitate: Secondary | ICD-10-CM | POA: Diagnosis not present

## 2022-09-08 DIAGNOSIS — S91302A Unspecified open wound, left foot, initial encounter: Secondary | ICD-10-CM | POA: Diagnosis present

## 2022-09-08 DIAGNOSIS — J90 Pleural effusion, not elsewhere classified: Secondary | ICD-10-CM | POA: Diagnosis present

## 2022-09-08 DIAGNOSIS — R042 Hemoptysis: Secondary | ICD-10-CM | POA: Diagnosis present

## 2022-09-08 DIAGNOSIS — I4821 Permanent atrial fibrillation: Secondary | ICD-10-CM | POA: Diagnosis present

## 2022-09-08 DIAGNOSIS — R188 Other ascites: Secondary | ICD-10-CM | POA: Diagnosis present

## 2022-09-08 DIAGNOSIS — S91301A Unspecified open wound, right foot, initial encounter: Secondary | ICD-10-CM | POA: Diagnosis present

## 2022-09-08 DIAGNOSIS — C7B8 Other secondary neuroendocrine tumors: Secondary | ICD-10-CM | POA: Diagnosis present

## 2022-09-08 DIAGNOSIS — I878 Other specified disorders of veins: Secondary | ICD-10-CM | POA: Diagnosis present

## 2022-09-08 DIAGNOSIS — E039 Hypothyroidism, unspecified: Secondary | ICD-10-CM | POA: Diagnosis present

## 2022-09-08 DIAGNOSIS — R791 Abnormal coagulation profile: Secondary | ICD-10-CM | POA: Diagnosis present

## 2022-09-08 LAB — GLUCOSE, CAPILLARY: Glucose-Capillary: 149 mg/dL — ABNORMAL HIGH (ref 70–99)

## 2022-09-08 LAB — CBC
HCT: 32.5 % — ABNORMAL LOW (ref 39.0–52.0)
Hemoglobin: 9.7 g/dL — ABNORMAL LOW (ref 13.0–17.0)
MCH: 28 pg (ref 26.0–34.0)
MCHC: 29.8 g/dL — ABNORMAL LOW (ref 30.0–36.0)
MCV: 93.9 fL (ref 80.0–100.0)
Platelets: 136 10*3/uL — ABNORMAL LOW (ref 150–400)
RBC: 3.46 MIL/uL — ABNORMAL LOW (ref 4.22–5.81)
RDW: 18.7 % — ABNORMAL HIGH (ref 11.5–15.5)
WBC: 6.3 10*3/uL (ref 4.0–10.5)
nRBC: 0 % (ref 0.0–0.2)

## 2022-09-08 LAB — C-REACTIVE PROTEIN: CRP: 8.6 mg/dL — ABNORMAL HIGH (ref <1.0)

## 2022-09-08 LAB — BASIC METABOLIC PANEL
Anion gap: 11 (ref 5–15)
BUN: 24 mg/dL — ABNORMAL HIGH (ref 8–23)
CO2: 31 mmol/L (ref 22–32)
Calcium: 7.6 mg/dL — ABNORMAL LOW (ref 8.9–10.3)
Chloride: 95 mmol/L — ABNORMAL LOW (ref 98–111)
Creatinine, Ser: 1.2 mg/dL (ref 0.61–1.24)
GFR, Estimated: >60 mL/min (ref >60)
Glucose, Bld: 94 mg/dL (ref 70–99)
Potassium: 2.8 mmol/L — ABNORMAL LOW (ref 3.5–5.1)
Sodium: 137 mmol/L (ref 135–145)

## 2022-09-08 LAB — MAGNESIUM: Magnesium: 2.1 mg/dL (ref 1.7–2.4)

## 2022-09-08 LAB — PROTIME-INR
INR: 2.1 — ABNORMAL HIGH (ref 0.8–1.2)
Prothrombin Time: 23.1 seconds — ABNORMAL HIGH (ref 11.4–15.2)

## 2022-09-08 LAB — PHOSPHORUS: Phosphorus: 3.2 mg/dL (ref 2.5–4.6)

## 2022-09-08 LAB — MRSA NEXT GEN BY PCR, NASAL: MRSA by PCR Next Gen: NOT DETECTED

## 2022-09-08 MED ORDER — SPIRONOLACTONE 25 MG PO TABS
25.0000 mg | ORAL_TABLET | Freq: Every day | ORAL | Status: DC
  Administered 2022-09-08 – 2022-09-16 (×9): 25 mg via ORAL
  Filled 2022-09-08 (×9): qty 1

## 2022-09-08 MED ORDER — FUROSEMIDE 40 MG PO TABS
40.0000 mg | ORAL_TABLET | Freq: Two times a day (BID) | ORAL | Status: DC
  Administered 2022-09-08 – 2022-09-16 (×15): 40 mg via ORAL
  Filled 2022-09-08 (×16): qty 1

## 2022-09-08 MED ORDER — POTASSIUM CHLORIDE CRYS ER 20 MEQ PO TBCR
40.0000 meq | EXTENDED_RELEASE_TABLET | ORAL | Status: AC
  Administered 2022-09-08: 40 meq via ORAL
  Filled 2022-09-08: qty 2

## 2022-09-08 NOTE — ED Notes (Signed)
Pt taken off HFNC by RT. Pt currently on 2L  and sats 95-98%.

## 2022-09-08 NOTE — Progress Notes (Addendum)
Progress Note    Louis Sanchez  ZOX:[id number] DOB: [birth year]  DOA: 09/07/2022 PCP: Adin Hector, MD      Brief Narrative:    Medical records reviewed and are as summarized below:  Louis Sanchez is a 82 y.o. 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       Assessment/Plan:   Principal Problem:   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 heart failure) (HCC) Active Problems:   Hemoptysis   Hypotension   Chronic atrial fibrillation (HCC)   Cirrhosis (HCC)   Acute on chronic respiratory failure with hypoxia (HCC)    There is no height or weight on file to calculate BMI.   Hemoptysis: Resolved.  No further workup from pulmonologist.   Hypotension: Improved after IV fluids in the emergency department.  Continue midodrine.    Acute on chronic hypoxic respiratory failure: He was previously on oxygen via heated humidified HFNC.  He has been weaned down to 2 L/min oxygen via [place].   Hypokalemia: Potassium is down to 2.8.  Replete potassium and monitor levels   Chronic right pleural effusion: Chest imaging reviewed by interventional radiologist.  Right effusion is felt to be chronic and not amenable to thoracentesis.   Liver cirrhosis: Continue Lasix and Aldactone   Acute on chronic diastolic CHF: He was given IV Lasix in the emergency department.  Restart home Lasix and Aldactone.  Infiltrates on CT chest likely due to edema.  Discontinue IV antibiotics. 2D echo in September 2023 showed EF estimated at 60 to 65%, normal LV diastolic parameters, moderate TR, moderate MR, mild to moderate TR   Permanent atrial fibrillation: Continue Xarelto   Patient wishes to continue with full CODE STATUS.   Diet Order             Diet 2 gram sodium Room service appropriate? Yes;  Fluid consistency: Thin  Diet effective now                            Consultants: Pulmonologist  Procedures: None    Medications:    ferrous sulfate  325 mg Oral QHS   furosemide  40 mg Oral BID   gabapentin  300 mg Oral TID   levothyroxine  175 mcg Oral Once per day on Mon Tue Wed Thu Fri Sat   metoprolol succinate  12.5 mg Oral QHS   midodrine  5 mg Oral TID WC   potassium chloride  40 mEq Oral Q4H   Rivaroxaban  15 mg Oral Q supper   rosuvastatin  10 mg Oral QHS   sertraline  150 mg Oral Daily   spironolactone  25 mg Oral Daily   tiotropium  18 mcg Inhalation Daily   Continuous Infusions:     Anti-infectives (From admission, onward)    Start     Dose/Rate Route Frequency Ordered Stop   09/07/22 1800  cefTRIAXone (ROCEPHIN) 1 g in sodium chloride 0.9 % 100 mL IVPB  Status:  Discontinued        1 g 200 mL/hr over 30 Minutes Intravenous Every 24 hours 09/07/22 1709 09/08/22 1456   09/07/22 1800  azithromycin (ZITHROMAX) 500 mg in sodium chloride 0.9 % 250 mL IVPB  Status:  Discontinued  500 mg 250 mL/hr over 60 Minutes Intravenous Every 24 hours 09/07/22 1709 09/08/22 1456   09/07/22 1245  piperacillin-tazobactam (ZOSYN) IVPB 3.375 g  Status:  Discontinued        3.375 g 100 mL/hr over 30 Minutes Intravenous  Once 09/07/22 1231 09/07/22 1301              Family Communication/Anticipated D/C date and plan/Code Status   DVT prophylaxis:  Rivaroxaban (XARELTO) tablet 15 mg     Code Status: Full Code  Family Communication: None Disposition Plan: Plan to discharge home tomorrow   Status is: Observation The patient will require care spanning > 2 midnights and should be moved to inpatient because: Monitor electrolytes and oxygen saturation       Subjective:   Interval events noted.  He feels better today.  No shortness of breath or chest pain.  Objective:    Vitals:   09/08/22 1037 09/08/22 1300 09/08/22 1350 09/08/22  1431  BP: (!) 95/55 99/69  100/66  Pulse: 76 76  80  Resp: 20 14  17   Temp: 97.7 F (36.5 C)   (!) 97.5 F (36.4 C)  TempSrc: Oral     SpO2: 97% 98% 95% 97%   No data found.   Intake/Output Summary (Last 24 hours) at 09/08/2022 1459 Last data filed at 09/08/2022 1038 Gross per 24 hour  Intake --  Output 2300 ml  Net -2300 ml   There were no vitals filed for this visit.  Exam:  GEN: NAD SKIN: No rash EYES: EOMI ENT: MMM CV: RRR PULM: CTA B ABD: soft, ND, NT, +BS CNS: AAO x 3, non focal EXT: No edema or tenderness.  Chronic wounds on dorsal aspect of bilateral feet          Pressure Injury 08/30/22 Coccyx Mid Deep Tissue Pressure Injury - Purple or maroon localized area of discolored intact skin or blood-filled blister due to damage of underlying soft tissue from pressure and/or shear. intact purple wound (Active)  08/30/22 1635  Location: Coccyx  Location Orientation: Mid  Staging: Deep Tissue Pressure Injury - Purple or maroon localized area of discolored intact skin or blood-filled blister due to damage of underlying soft tissue from pressure and/or shear.  Wound Description (Comments): intact purple wound  Present on Admission: Yes  Dressing Type Foam - Lift dressing to assess site every shift 09/03/22 5053     Data Reviewed:   I have personally reviewed following labs and imaging studies:  Labs: Labs show the following:   Basic Metabolic Panel: Recent Labs  Lab 09/07/22 0932 09/08/22 0440 09/08/22 0727  NA 136 137  --   K 3.6 2.8*  --   CL 93* 95*  --   CO2 33* 31  --   GLUCOSE 111* 94  --   BUN 28* 24*  --   CREATININE 1.30* 1.20  --   CALCIUM 8.1* 7.6*  --   MG  --   --  2.1  PHOS  --   --  3.2   GFR Estimated Creatinine Clearance: 44.3 mL/min (by C-G formula based on SCr of 1.2 mg/dL). Liver Function Tests: No results for input(s): "AST", "ALT", "ALKPHOS", "BILITOT", "PROT", "ALBUMIN" in the last 168 hours. No results for input(s):  "LIPASE", "AMYLASE" in the last 168 hours. No results for input(s): "AMMONIA" in the last 168 hours. Coagulation profile Recent Labs  Lab 09/07/22 0932 09/08/22 0440  INR 3.4* 2.1*    CBC: Recent Labs  Lab 09/07/22 0932 09/07/22 1221 09/08/22 0440  WBC 10.1 8.3 6.3  HGB 10.9* 10.3* 9.7*  HCT 35.7* 33.7* 32.5*  MCV 92.5 92.6 93.9  PLT 164 149* 136*   Cardiac Enzymes: No results for input(s): "CKTOTAL", "CKMB", "CKMBINDEX", "TROPONINI" in the last 168 hours. BNP (last 3 results) No results for input(s): "PROBNP" in the last 8760 hours. CBG: Recent Labs  Lab 09/02/22 0828  GLUCAP 111*   D-Dimer: No results for input(s): "DDIMER" in the last 72 hours. Hgb A1c: No results for input(s): "HGBA1C" in the last 72 hours. Lipid Profile: No results for input(s): "CHOL", "HDL", "LDLCALC", "TRIG", "CHOLHDL", "LDLDIRECT" in the last 72 hours. Thyroid function studies: No results for input(s): "TSH", "T4TOTAL", "T3FREE", "THYROIDAB" in the last 72 hours.  Invalid input(s): "FREET3" Anemia work up: No results for input(s): "VITAMINB12", "FOLATE", "FERRITIN", "TIBC", "IRON", "RETICCTPCT" in the last 72 hours. Sepsis Labs: Recent Labs  Lab 09/07/22 0932 09/07/22 1221 09/07/22 1240 09/08/22 0440  WBC 10.1 8.3  --  6.3  LATICACIDVEN  --   --  1.5  --     Microbiology Recent Results (from the past 240 hour(s))  Blood Culture (routine x 2)     Status: None   Collection Time: 08/29/22  3:30 PM   Specimen: BLOOD  Result Value Ref Range Status   Specimen Description BLOOD BLOOD LEFT ARM  Final   Special Requests   Final    BOTTLES DRAWN AEROBIC AND ANAEROBIC Blood Culture results may not be optimal due to an excessive volume of blood received in culture bottles   Culture   Final    NO GROWTH 5 DAYS Performed at [facility], [address] [place], [place] [address]    Report Status 09/03/2022 FINAL  Final  Blood Culture (routine x 2)     Status: None    Collection Time: 08/29/22  3:45 PM   Specimen: BLOOD  Result Value Ref Range Status   Specimen Description BLOOD BLOOD LEFT FOREARM  Final   Special Requests   Final    BOTTLES DRAWN AEROBIC AND ANAEROBIC Blood Culture results may not be optimal due to an excessive volume of blood received in culture bottles   Culture   Final    NO GROWTH 5 DAYS Performed at [facility], [address] [place], [place] [address]    Report Status 09/03/2022 FINAL  Final  Respiratory (~20 pathogens) panel by PCR     Status: None   Collection Time: 08/29/22  8:48 PM   Specimen: Nasopharyngeal Swab; Respiratory  Result Value Ref Range Status   Adenovirus NOT DETECTED NOT DETECTED Final   Coronavirus 229E NOT DETECTED NOT DETECTED Final    Comment: (NOTE) The Coronavirus on the Respiratory Panel, DOES NOT test for the novel  Coronavirus (2019 nCoV)    Coronavirus HKU1 NOT DETECTED NOT DETECTED Final   Coronavirus NL63 NOT DETECTED NOT DETECTED Final   Coronavirus OC43 NOT DETECTED NOT DETECTED Final   Metapneumovirus NOT DETECTED NOT DETECTED Final   Rhinovirus / Enterovirus NOT DETECTED NOT DETECTED Final   Influenza A NOT DETECTED NOT DETECTED Final   Influenza B NOT DETECTED NOT DETECTED Final   Parainfluenza Virus 1 NOT DETECTED NOT DETECTED Final   Parainfluenza Virus 2 NOT DETECTED NOT DETECTED Final   Parainfluenza Virus 3 NOT DETECTED NOT DETECTED Final   Parainfluenza Virus 4 NOT DETECTED NOT DETECTED Final   Respiratory Syncytial Virus NOT DETECTED NOT DETECTED Final  Bordetella pertussis NOT DETECTED NOT DETECTED Final   Bordetella Parapertussis NOT DETECTED NOT DETECTED Final   Chlamydophila pneumoniae NOT DETECTED NOT DETECTED Final   Mycoplasma pneumoniae NOT DETECTED NOT DETECTED Final    Comment: Performed at [facility], [place] [address], [place], [place] [address]  Blood culture (routine x 2)     Status: None (Preliminary result)   Collection Time: 09/07/22 12:40  PM   Specimen: BLOOD  Result Value Ref Range Status   Specimen Description BLOOD RIGHT ARM  Final   Special Requests   Final    BOTTLES DRAWN AEROBIC AND ANAEROBIC Blood Culture results may not be optimal due to an excessive volume of blood received in culture bottles   Culture   Final    NO GROWTH < 24 HOURS Performed at [facility], [address] [place], [place] [address]    Report Status PENDING  Incomplete    Procedures and diagnostic studies:  DG Chest 1 View  Result Date: 09/07/2022 CLINICAL DATA:  Short of breath.  History of lung cancer EXAM: CHEST  1 VIEW COMPARISON:  09/07/2022 FINDINGS: Anterior cervical fusion. Stable enlarged cardiac silhouette. Bilateral pleural effusions with low lung volumes. Consolidation at the RIGHT lung base unchanged. No pneumothorax. IMPRESSION: 1. No significant interval change. 2. Bilateral pleural effusions and RIGHT basilar consolidation. 3. Cardiomegaly. Electronically Signed   By: Suzy Bouchard M.D.   On: 09/07/2022 19:26   CT Chest Wo Contrast  Result Date: 09/07/2022 CLINICAL DATA:  Aspiration. Coughing. Hemoptysis. History of lung cancer. EXAM: CT CHEST WITHOUT CONTRAST TECHNIQUE: Multidetector CT imaging of the chest was performed following the standard protocol without IV contrast. RADIATION DOSE REDUCTION: This exam was performed according to the departmental dose-optimization program which includes automated exposure control, adjustment of the mA and/or kV according to patient size and/or use of iterative reconstruction technique. COMPARISON:  Chest x-ray 09/07/2022 earlier. Old CT scan 08/29/2022 and older FINDINGS: Cardiovascular: Heart is large with significant multifocal chamber enlargement. Stable pericardial effusion, small. Coronary artery calcifications are seen. On this non IV contrast exam, the aorta has a normal course and caliber overall with scattered vascular calcifications. Only mild ectasia of the ascending  aorta approaching 4 cm. Mediastinum/Nodes: No specific abnormal lymph node enlargement identified in the axillary regions, hilum. There is some prominent mediastinal nodes once again identified. Example left of the arch on series 2, image 44 measuring 16 by 11 mm, unchanged. Normal caliber thoracic esophagus. Small thyroid. Prominent right supraclavicular nodes are again seen. Lungs/Pleura: Motion. Increasing size of the small left pleural effusion. Adjacent parenchymal lung opacities are increasing at the left lower lobe. Acute infiltrates possible. There are increasing areas of patchy confluent ground-glass in the left upper lobe as well superiorly both anterior and posterior. Acute infiltrates possible. The opacity seen previously in the right lower posterior lung is also increasing and becoming more confluent with air bronchograms. Also persistent interstitial changes thickening of the right upper lung. Underlying emphysematous changes. There is a loculated right-sided pleural effusion posteroinferior with pleural thickening and calcification which is stable. Surgical changes along the right lung from right lower lobectomy Upper Abdomen: Ascites in the upper abdomen. Mesenteric stranding. There is significant artifact from the arms being scanned at the patient's side. There is some nodular thickening in the area of the left adrenal gland, unchanged from prior. Please correlate with any prior workup. Musculoskeletal: Anasarca. Motion. Curvature and degenerative changes along the spine. Fixation hardware along the lower  cervical spine. IMPRESSION: Compared to the most recent chest CT scan there are increasing areas of parenchymal opacity bilaterally particularly left lower and upper lobe but also areas in the right lung. Acute infiltrates possible. Recommend follow-up. Increasing small left pleural effusion. Stable loculated right effusion with pleural thickening. Markedly enlarged heart. Increasing anasarca.  Component of lung edema are not excluded. Stable prominent to mildly enlarged supraclavicular right-sided nodes and mediastinal nodes. Attention on follow-up Aortic Atherosclerosis (ICD10-I70.0) and Emphysema (ICD10-J43.9). Electronically Signed   By: Jill Side M.D.   On: 09/07/2022 11:38   DG Chest 2 View  Result Date: 09/07/2022 CLINICAL DATA:  Hemoptysis versus upper GI bleeding EXAM: CHEST - 2 VIEW COMPARISON:  CT chest dated 08/29/2022 FINDINGS: Status post right lower lobectomy. Patchy/masslike right lower lung opacity with additional patchy left lower lobe opacities, better evaluated on recent CT, favoring multifocal pneumonia, possibly on the basis of aspiration. Small to moderate loculated right pleural effusion, chronic. Small left pleural effusion. No frank interstitial edema.  No pneumothorax. Cardiomegaly. Cervical spine fixation hardware. IMPRESSION: Status post right lower lobectomy. Multifocal patchy lower lung opacities, right greater than left, favoring multifocal pneumonia, possibly on the basis of aspiration. This is better evaluated on recent CT. Follow-up chest radiographs are suggested in 4-6 weeks to document clearance. Small to moderate loculated right pleural effusion, chronic. Small left pleural effusion. No frank interstitial edema. Electronically Signed   By: Julian Hy M.D.   On: 09/07/2022 10:45               LOS: 0 days   Louis Sanchez  [facility]   Pager on [email or web address]. If 7PM-7AM, please contact night-coverage at [email or web address]     09/08/2022, 2:59 PM

## 2022-09-08 NOTE — ED Notes (Signed)
Notified Dr. Ernestina Columbia of patient's potassium of 2.8. Spoke with lab about why this was not reported to me with a phone call, and they stated that their criteria of a critical value of potassium is 2.7 or less.

## 2022-09-08 NOTE — ED Notes (Signed)
Informed RN bed assigned 

## 2022-09-08 NOTE — Progress Notes (Signed)
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  In the ED he had CT chest without contrast with findings of loculated right pleural effusion with pleural thickening suggestive of possible empyema as well as acute infiltrates bilaterally and small left pleural effusion.  Additional findings include mediastinal lymphadenopathy including station 4L which is 16 mm in size and right supraclavicular lymphadenopathy.  Pulmonary consultation placed for additional evaluation management.  09/08/22 - patient had resolution of hemoptysis post nebulized TXA.  He improved but then seemed to still have hypoxemia and was actually advanced to HFNC at 40%.  He had IR evaluation but they are not able to help with effusion due to chronicity.   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)    Other and unspecified hyperlipidemia 1997   Other specified disorder of male genital organs(608.89) 08/2012   Sleep disturbance, unspecified    Thyroid disease    Unspecified hypothyroidism  2005     SURGICAL HISTORY   Past Surgical History:  Procedure Laterality Date   APPENDECTOMY  1953   colon polyps  2009   [place]   Right   INGUINAL HERNIA REPAIR Right 2014   SPINE SURGERY  1997   neck   WRIST SURGERY       FAMILY HISTORY   Family History  Problem Relation Age of Onset   Alzheimer's disease Mother      SOCIAL HISTORY   Social History   Tobacco Use   Smoking status: Former    Years: 25.00    Types: Cigarettes   Smokeless tobacco: Never  Vaping Use   Vaping Use: Never used  Substance Use Topics   Alcohol use: Not Currently    Alcohol/week: 15.0 standard drinks of alcohol    Types: 15 Standard drinks or equivalent per week   Drug use: No     MEDICATIONS    Home Medication:  Current Outpatient Rx   Order #: [id number] Class: Historical Med   Order #: [id number] Class: Normal   Order #: [id number] Class: Normal   Order #: [id number] Class: Historical Med   Order #: [id number] Class: Normal   Order #: [id number] Class: Historical Med   Order #: [id number] Class: Historical Med   Order #: [id number] Class: Historical Med   Order #: [id number] Class: Historical Med   Order #: [id number] Class: Normal   Order #: [id number] Class: Historical Med  Order #: [id number] Class: Normal    Current Medication:  Current Facility-Administered Medications:    acetaminophen (TYLENOL) tablet 650 mg, 650 mg, Oral, Q6H PRN **OR** acetaminophen (TYLENOL) suppository 650 mg, 650 mg, Rectal, Q6H PRN, Jose Persia, MD   azithromycin (ZITHROMAX) 500 mg in sodium chloride 0.9 % 250 mL IVPB, 500 mg, Intravenous, Q24H, Jose Persia, MD, Stopped at 09/07/22 1852   cefTRIAXone (ROCEPHIN) 1 g in sodium chloride 0.9 % 100 mL IVPB, 1 g, Intravenous, Q24H, Jose Persia, MD, Stopped at 09/07/22 1919   ferrous sulfate tablet 325 mg, 325 mg, Oral, QHS, Jose Persia, MD   gabapentin (NEURONTIN) capsule 300 mg, 300 mg, Oral, TID, Jose Persia, MD, 300 mg at 09/07/22  1510   levothyroxine (SYNTHROID) tablet 175 mcg, 175 mcg, Oral, Once per day on Mon Tue Wed Thu Fri Sat, Merrill, Kristin A, RPH   metoprolol succinate (TOPROL-XL) 24 hr tablet 12.5 mg, 12.5 mg, Oral, QHS, Jose Persia, MD   midodrine (PROAMATINE) tablet 5 mg, 5 mg, Oral, TID WC, Jose Persia, MD, 5 mg at 09/07/22 1510   ondansetron (ZOFRAN) tablet 4 mg, 4 mg, Oral, Q6H PRN **OR** ondansetron (ZOFRAN) injection 4 mg, 4 mg, Intravenous, Q6H PRN, Jose Persia, MD   polyethylene glycol (MIRALAX / GLYCOLAX) packet 17 g, 17 g, Oral, Daily PRN, Jose Persia, MD   potassium chloride SA (KLOR-CON M) CR tablet 40 mEq, 40 mEq, Oral, Q4H, Jennye Boroughs, MD   Rivaroxaban (XARELTO) tablet 15 mg, 15 mg, Oral, Q supper, Jose Persia, MD   rosuvastatin (CRESTOR) tablet 10 mg, 10 mg, Oral, QHS, Jose Persia, MD   sertraline (ZOLOFT) tablet 150 mg, 150 mg, Oral, Daily, Jose Persia, MD   tiotropium (SPIRIVA) inhalation capsule ([facility] use ONLY) 18 mcg, 18 mcg, Inhalation, Daily, Jose Persia, MD  Current Outpatient Medications:    ferrous sulfate 325 (65 FE) MG tablet, Take 325 mg by mouth at bedtime., Disp: , Rfl:    furosemide (LASIX) 40 MG tablet, Take 1 tablet (40 mg total) by mouth 2 (two) times daily., Disp: 60 tablet, Rfl: 0   gabapentin (NEURONTIN) 300 MG capsule, Take 1 capsule (300 mg total) by mouth 3 (three) times daily., Disp: 90 capsule, Rfl: 0   levothyroxine (SYNTHROID) 175 MCG tablet, Take 175 mcg by mouth See admin instructions. Take 1 tablet (146mcg) by mouth every Monday, Tuesday, Wednesday, Thursday, Friday and Saturday morning before breakfast, Disp: , Rfl:    metoprolol succinate (TOPROL XL) 25 MG 24 hr tablet, Take 0.5 tablets (12.5 mg total) by mouth at bedtime., Disp: 15 tablet, Rfl: 0   Multiple Vitamin (MULTIVITAMIN) tablet, Take 1 tablet by mouth daily., Disp: , Rfl:    nitroGLYCERIN (NITROSTAT) 0.4 MG SL tablet, Place 0.4 mg under the tongue every 5 (five)  minutes as needed for chest pain., Disp: , Rfl:    rosuvastatin (CRESTOR) 10 MG tablet, Take 10 mg by mouth at bedtime., Disp: , Rfl:    sertraline (ZOLOFT) 100 MG tablet, Take 150 mg by mouth daily., Disp: , Rfl:    spironolactone (ALDACTONE) 25 MG tablet, Take 1 tablet (25 mg total) by mouth daily., Disp: 30 tablet, Rfl: 0   tiotropium (SPIRIVA) 18 MCG inhalation capsule, Place 18 mcg into inhaler and inhale daily., Disp: , Rfl:    rivaroxaban (XARELTO) 15 MG TABS tablet, Take 1 tablet (15 mg total) by mouth daily with supper., Disp: 30 tablet, Rfl: 0    ALLERGIES   Atorvastatin and Levofloxacin  REVIEW OF SYSTEMS    Review of Systems:  Gen:  Denies  fever, sweats, chills weigh loss  HEENT: Denies blurred vision, double vision, ear pain, eye pain, hearing loss, nose bleeds, sore throat Cardiac:  No dizziness, chest pain or heaviness, chest tightness,edema Resp:   reports dyspnea chronically  Gi: Denies swallowing difficulty, stomach pain, nausea or vomiting, diarrhea, constipation, bowel incontinence Gu:  Denies bladder incontinence, burning urine Ext:   Denies Joint pain, stiffness or swelling Skin: Denies  skin rash, easy bruising or bleeding or hives Endoc:  Denies polyuria, polydipsia , polyphagia or weight change Psych:   Denies depression, insomnia or hallucinations   Other:  All other systems negative   VS: BP (!) 93/57   Pulse 70   Temp 97.7 F (36.5 C) (Oral)   Resp 17   SpO2 97%      PHYSICAL EXAM    GENERAL:NAD, no fevers, chills, no weakness no fatigue HEAD: Normocephalic, atraumatic.  EYES: Pupils equal, round, reactive to light. Extraocular muscles intact. No scleral icterus.  MOUTH: Moist mucosal membrane. Dentition intact. No abscess noted.  EAR, NOSE, THROAT: Clear without exudates. No external lesions.  NECK: Supple. No thyromegaly. No nodules. No JVD.  PULMONARY: decreased breath sounds worse on the right CARDIOVASCULAR: S1 and S2. Regular  rate and rhythm. No murmurs, rubs, or gallops. No edema. Pedal pulses 2+ bilaterally.  GASTROINTESTINAL: Soft, nontender, nondistended. No masses. Positive bowel sounds. No hepatosplenomegaly.  MUSCULOSKELETAL: No swelling, clubbing, or edema. Range of motion full in all extremities.  NEUROLOGIC: Cranial nerves II through XII are intact. No gross focal neurological deficits. Sensation intact. Reflexes intact.  SKIN: No ulceration, lesions, rashes, or cyanosis. Skin warm and dry. Turgor intact.  PSYCHIATRIC: Mood, affect within normal limits. The patient is awake, alert and oriented x 3. Insight, judgment intact.       IMAGING     ASSESSMENT/PLAN   Acute hypoxemic respiratory failure - present on admission  - COVID19 /RSV/infulenza panel ordered   - supplemental O2 during my evaluation -room air  -BNP is elevated suggestive of acute on chronic CHF exacerbation  -Respiratory viral panel -Nasal MRSA screen  - empiric Rocephin Zithromax for CAP coverage  -strep pneumoniae ur AG -sputum resp cultures -AFB sputum expectorated specimen -sputum cytology  -reviewed pertinent imaging with patient today - ESR/CRP -PT/OT -chest physiotherapy with flutter and IS -please encourage patient to use incentive spirometer few times each hour while hospitalized.     Loculated right pleural effusion with pleural thickening  - diagnostic and theraputic throacentesis per IR if possible   - continue rocephin , zithromax  Non massive hemoptysis    Nebulized tXA x 1 dose    Acute on chronic systolic CHF     - patient is hypotensive, may be difficult to diurese.  Patient takes midodrine at home    - consider cardiology consult      Multiple comorbid conditions    Consider palliative evaluation , patient is full code     Thank you for allowing me to participate in the care of this patient.   Patient/Family are satisfied with care plan and all questions have been answered.    Provider  disclosure: Patient with at least one acute or chronic illness or injury that poses a threat to life or bodily function and is being managed actively during this encounter.  All of the below services have been performed independently by signing provider:  review of prior documentation from internal  and or external health records.  Review of previous and current lab results.  Interview and comprehensive assessment during patient visit today. Review of current and previous chest radiographs/CT scans. Discussion of management and test interpretation with health care team and patient/family.   This document was prepared using Dragon voice recognition software and may include unintentional dictation errors.     Ottie Glazier, M.D.  Division of Pulmonary & Critical Care Medicine

## 2022-09-08 NOTE — Progress Notes (Signed)
IR received request for right sided thoracentesis, imaging and case reviewed with my attending, Dr. Denna Haggard who was involved in this patient's care 2/6 for attempted but cancelled right thoracentesis. Right effusion is chronic and unchanged x 1 year and not amendable to chest tube or thoracentesis. Ordering provider was made aware of this.  Hedy Jacob, PA-C 09/08/2022, 10:53 AM

## 2022-09-09 DIAGNOSIS — E876 Hypokalemia: Secondary | ICD-10-CM | POA: Diagnosis not present

## 2022-09-09 DIAGNOSIS — I5033 Acute on chronic diastolic (congestive) heart failure: Secondary | ICD-10-CM | POA: Diagnosis not present

## 2022-09-09 DIAGNOSIS — R042 Hemoptysis: Secondary | ICD-10-CM | POA: Diagnosis not present

## 2022-09-09 DIAGNOSIS — J9621 Acute and chronic respiratory failure with hypoxia: Secondary | ICD-10-CM | POA: Diagnosis not present

## 2022-09-09 LAB — BASIC METABOLIC PANEL
Anion gap: 9 (ref 5–15)
BUN: 21 mg/dL (ref 8–23)
CO2: 35 mmol/L — ABNORMAL HIGH (ref 22–32)
Calcium: 7.9 mg/dL — ABNORMAL LOW (ref 8.9–10.3)
Chloride: 91 mmol/L — ABNORMAL LOW (ref 98–111)
Creatinine, Ser: 1.14 mg/dL (ref 0.61–1.24)
GFR, Estimated: >60 mL/min (ref >60)
Glucose, Bld: 114 mg/dL — ABNORMAL HIGH (ref 70–99)
Potassium: 3 mmol/L — ABNORMAL LOW (ref 3.5–5.1)
Sodium: 135 mmol/L (ref 135–145)

## 2022-09-09 LAB — CBC WITH DIFFERENTIAL/PLATELET
Abs Immature Granulocytes: 0.02 10*3/uL (ref 0.00–0.07)
Basophils Absolute: 0 10*3/uL (ref 0.0–0.1)
Basophils Relative: 1 %
Eosinophils Absolute: 0 10*3/uL (ref 0.0–0.5)
Eosinophils Relative: 1 %
HCT: 35.3 % — ABNORMAL LOW (ref 39.0–52.0)
Hemoglobin: 10.6 g/dL — ABNORMAL LOW (ref 13.0–17.0)
Immature Granulocytes: 0 %
Lymphocytes Relative: 7 %
Lymphs Abs: 0.5 10*3/uL — ABNORMAL LOW (ref 0.7–4.0)
MCH: 28 pg (ref 26.0–34.0)
MCHC: 30 g/dL (ref 30.0–36.0)
MCV: 93.4 fL (ref 80.0–100.0)
Monocytes Absolute: 0.6 10*3/uL (ref 0.1–1.0)
Monocytes Relative: 9 %
Neutro Abs: 5.4 10*3/uL (ref 1.7–7.7)
Neutrophils Relative %: 82 %
Platelets: 153 10*3/uL (ref 150–400)
RBC: 3.78 MIL/uL — ABNORMAL LOW (ref 4.22–5.81)
RDW: 18.6 % — ABNORMAL HIGH (ref 11.5–15.5)
WBC: 6.6 10*3/uL (ref 4.0–10.5)
nRBC: 0 % (ref 0.0–0.2)

## 2022-09-09 LAB — RESPIRATORY PANEL BY PCR

## 2022-09-09 LAB — RESP PANEL BY RT-PCR (RSV, FLU A&B, COVID)  RVPGX2
Influenza A by PCR: NEGATIVE
Influenza B by PCR: NEGATIVE
Resp Syncytial Virus by PCR: NEGATIVE
SARS Coronavirus 2 by RT PCR: NEGATIVE

## 2022-09-09 LAB — POTASSIUM: Potassium: 3.4 mmol/L — ABNORMAL LOW (ref 3.5–5.1)

## 2022-09-09 MED ORDER — POTASSIUM CHLORIDE CRYS ER 20 MEQ PO TBCR
40.0000 meq | EXTENDED_RELEASE_TABLET | ORAL | Status: AC
  Administered 2022-09-09 (×2): 40 meq via ORAL
  Filled 2022-09-09 (×2): qty 2

## 2022-09-09 MED ORDER — MIDODRINE HCL 5 MG PO TABS: 5.0000 mg | ORAL_TABLET | Freq: Three times a day (TID) | ORAL | Status: AC

## 2022-09-09 MED ORDER — POTASSIUM CHLORIDE CRYS ER 20 MEQ PO TBCR: 20.0000 meq | EXTENDED_RELEASE_TABLET | Freq: Every day | ORAL | 0 refills | Status: DC

## 2022-09-09 MED ORDER — POTASSIUM CHLORIDE CRYS ER 20 MEQ PO TBCR
40.0000 meq | EXTENDED_RELEASE_TABLET | Freq: Once | ORAL | Status: AC
  Administered 2022-09-09: 40 meq via ORAL
  Filled 2022-09-09: qty 2

## 2022-09-09 MED ORDER — RIVAROXABAN 20 MG PO TABS: 20.0000 mg | ORAL_TABLET | Freq: Every day | ORAL | Status: DC

## 2022-09-09 NOTE — Consult Note (Addendum)
WOC Nurse Consult Note: Reason for Consult: Consult requested for right shoulder Stage 1, left back Stage 1, Sacrum Deep tissue pressure injury, redness to scrotum, and weeping areas to bilat feet.  Performed remotely after review of progress notes. Pt is familiar to [facility] team from recent admission on 2/6.  Pressure Injury POA: Yes; according to bedside nursing flow sheet, patient has a Deep tissue pressure injury to the sacrum which was present on admission.  According to recent assessment on 2/6, Pt has several full thickness wounds to bilat anterior feet and ankle/heel which are pale red and moist, ankle with white macerated skin surrounding. Anterior feet with dry yellow scabbed skin surrounding the wounds.  Topical treatment orders provided for bedside nurses to perform as follows to absorb drainage and promote healing: 1. Cleanse bilat foot wounds with NS, then top with a piece of calcium alginate Kellie Simmering # [phone]) and cover with an ABD pads and Kerlix. 2. Apply barrier cream and antifungal powder to scrotum with each turning and cleaning session 3. Foam dressing to sacrum, right posterior shoulder, and left back.  Change Q 3 days or PRN soiling Please re-consult if further assistance is needed.  Thank-you,  Julien Girt MSN, [place], [place], [place], [place]

## 2022-09-09 NOTE — Evaluation (Signed)
Physical Therapy Evaluation Patient Details Name: Louis Sanchez MRN: [id number] DOB: 1941-04-16 Today's Date: 09/09/2022  History of Present Illness  Louis Sanchez is a 82 y.o. 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     Mr. Sartin states that over the last 1 to 2 days, he has noticed little specks of bright red blood in his sputum.  Clinical Impression  Patient received sitting on side of bed. He is agreeable to PT assessment. Patient required mod A to stand from bed. He ambulated about 30 feet with gait belt assist. Patient is very unsteady and resistant to using RW. States that he doesn't need it and me holding him is throwing him off. Patient with decreased safety awareness. His O2 sats dropped with ambulation on room air, and improved on 2 liters of O2. Difficult to get reading on him. Poor pleth. Patient will continue to benefit from skilled PT to improve safety, strength and independence.         Recommendations for follow up therapy are one component of a multi-disciplinary discharge planning process, led by the attending physician.  Recommendations may be updated based on patient status, additional functional criteria and insurance authorization.  Follow Up Recommendations Skilled nursing-short term rehab (<3 hours/day) Can patient physically be transported by private vehicle: Yes    Assistance Recommended at Discharge Frequent or constant Supervision/Assistance  Patient can return home with the following  A lot of help with walking and/or transfers;A little help with bathing/dressing/bathroom;Assist for transportation;Direct supervision/assist for medications management    Equipment Recommendations None recommended by PT  Recommendations for Other Services       Functional Status Assessment  Patient has had a recent decline in their functional status and demonstrates the ability to make significant improvements in function in a reasonable and predictable amount of time.     Precautions / Restrictions Precautions Precautions: Fall Restrictions Weight Bearing Restrictions: No      Mobility  Bed Mobility               General bed mobility comments: Patient received sitting up on side of bed attempting to brush his teeth.    Transfers Overall transfer level: Needs assistance Equipment used: 1 person hand held assist Transfers: Sit to/from Stand Sit to Stand: Mod assist           General transfer comment: Min/Mod assist to power up to standing from edge of bed. Reporting "I'm exhausted already just trying to scoot to edge of bed."    Ambulation/Gait Ambulation/Gait assistance: Mod assist Gait Distance (Feet): 30 Feet Assistive device: 1 person hand held assist Gait Pattern/deviations: Decreased step length - right, Decreased step length - left, Decreased stride length, Trunk flexed, Staggering right, Staggering left Gait velocity: decr     General Gait Details: patient very unsteady with ambulation. States he doesn't need walker and that I am throwing him off by holding onto him. (However if I were not holding on I believe he would have fallen.)  Stairs            Wheelchair Mobility    Modified Rankin (Stroke Patients Only)       Balance Overall balance assessment: Needs assistance, History of Falls Sitting-balance support: Feet supported Sitting balance-Leahy Scale: Fair     Standing balance support: Single extremity supported, During functional activity  Standing balance-Leahy Scale: Poor Standing balance comment: Resistant to assistance, but definitely needs assistance to prevent falls. Would benefit from RW. Dont know that he would use on his own.                             Pertinent Vitals/Pain Pain Assessment Pain  Assessment: Faces Faces Pain Scale: Hurts little more Pain Location: tops of feet, not new Pain Descriptors / Indicators: Discomfort Pain Intervention(s): Monitored during session    Home Living Family/patient expects to be discharged to:: Assisted living Living Arrangements: Spouse/significant other               Home Equipment: Conservation officer, nature (2 wheels);Rollator (4 wheels);Shower seat;Grab bars - tub/shower;Grab bars - toilet Additional Comments: states he does not use AD at baseline    Prior Function Prior Level of Function : Independent/Modified Independent             Mobility Comments: independent at baseline ADLs Comments: independent per his report     Hand Dominance   Dominant Hand: Right    Extremity/Trunk Assessment   Upper Extremity Assessment Upper Extremity Assessment: Generalized weakness    Lower Extremity Assessment Lower Extremity Assessment: Generalized weakness    Cervical / Trunk Assessment Cervical / Trunk Assessment: Kyphotic  Communication   Communication: No difficulties  Cognition Arousal/Alertness: Awake/alert Behavior During Therapy: WFL for tasks assessed/performed Overall Cognitive Status: No family/caregiver present to determine baseline cognitive functioning                                 General Comments: Patient reported he does not use O2 at home, but per H & P he does wear O2 at home. Poor safety awareness.        General Comments      Exercises     Assessment/Plan    PT Assessment Patient needs continued PT services  PT Problem List Decreased activity tolerance;Decreased strength;Decreased range of motion;Decreased balance;Decreased mobility;Decreased knowledge of use of DME;Decreased safety awareness;Cardiopulmonary status limiting activity;Decreased cognition;Decreased skin integrity       PT Treatment Interventions DME instruction;Gait training;Functional mobility training;Therapeutic  activities;Therapeutic exercise;Balance training;Patient/family education;Cognitive remediation    PT Goals (Current goals can be found in the Care Plan section)  Acute Rehab PT Goals Patient Stated Goal: none stated PT Goal Formulation: With patient Time For Goal Achievement: 09/22/22    Frequency Min 2X/week     Co-evaluation               AM-PAC PT "6 Clicks" Mobility  Outcome Measure Help needed turning from your back to your side while in a flat bed without using bedrails?: None Help needed moving from lying on your back to sitting on the side of a flat bed without using bedrails?: None Help needed moving to and from a bed to a chair (including a wheelchair)?: A Lot Help needed standing up from a chair using your arms (e.g., wheelchair or bedside chair)?: A Lot Help needed to walk in hospital room?: A Lot Help needed climbing 3-5 steps with a railing? : Total 6 Click Score: 15    End of Session Equipment Utilized During Treatment: Gait belt;Oxygen Activity Tolerance: Other (comment);Patient limited by fatigue (Decreased O2 sats with mobility) Patient left: in chair;with chair alarm set;with call bell/phone within reach Nurse Communication: Mobility status PT Visit Diagnosis: Muscle weakness (generalized) (M62.81);Difficulty  in walking, not elsewhere classified (R26.2);Unsteadiness on feet (R26.81);History of falling (Z91.81)    Time: 1140-1210 PT Time Calculation (min) (ACUTE ONLY): 30 min   Charges:   PT Evaluation $PT Eval Moderate Complexity: 1 Mod PT Treatments $Gait Training: 8-22 mins        Sharian Delia, PT, GCS 09/09/22,12:31 PM

## 2022-09-09 NOTE — Progress Notes (Signed)
Mobility Specialist - Progress Note   09/09/22 1500  Mobility  Activity Transferred from chair to bed  Level of Assistance Moderate assist, patient does 50-74%  Assistive Device Other (Comment);Four point cane (HHA)  Distance Ambulated (ft) 2 ft  Activity Response Tolerated well  $Mobility charge 1 Mobility     Pt transferred chair-bed. MaxA STS from recliner; modA to SPT without AD. Pt left in bed with alarm set, needs in reach.    Kathee Delton Mobility Specialist 09/09/22, 3:47 PM

## 2022-09-09 NOTE — TOC Initial Note (Addendum)
Transition of Care [facility]) - Initial/Assessment Note    Patient Details  Name: Louis Sanchez MRN: [id number] Date of Birth: [birth year]  Transition of Care [facility]) CM/SW Contact:    Louis Bash, LCSW Phone Number: 09/09/2022, 9:22 AM  Clinical Narrative:                  Update: spoke with patient regarding PT recommendation of SNF, he reports he doesn't need that and can walk himself. He would like to go home to [facility]. CSW spoke with Louis Sanchez executive director with [facility] who reports they will need to come assess patient Monday morning around 11am to ensure he can return to their level of care. CSW has updated patient, MD, and TOC supervisor.        CSW notes patient is from [facility] assisted living, is active with [facility] and has home O2 via [employer].  TOC will follow for discharge planning needs.   Expected Discharge Plan: Assisted Living Barriers to Discharge: Continued Medical Work up   Patient Goals and CMS Choice Patient states their goals for this hospitalization and ongoing recovery are:: to go home CMS Medicare.gov Compare Post Acute Care list provided to:: Patient Choice offered to / list presented to : Patient      Expected Discharge Plan and Services       Living arrangements for the past 2 months: [place]                                      Prior Living Arrangements/Services Living arrangements for the past 2 months: [place] Lives with:: Facility Resident                   Activities of Daily Living Home Assistive Devices/Equipment: Eyeglasses, Oxygen ADL Screening (condition at time of admission) Patient's cognitive ability adequate to safely complete daily activities?: Yes Is the patient deaf or have difficulty hearing?: No Does the patient have difficulty seeing, even when wearing glasses/contacts?: No Does the patient have difficulty concentrating,  remembering, or making decisions?: Yes Patient able to express need for assistance with ADLs?: Yes Does the patient have difficulty dressing or bathing?: Yes Independently performs ADLs?: No Communication: Independent Dressing (OT): Needs assistance Grooming: Needs assistance Feeding: Independent Bathing: Needs assistance Is this a change from baseline?: Pre-admission baseline Toileting: Needs assistance Is this a change from baseline?: Pre-admission baseline In/Out Bed: Needs assistance Is this a change from baseline?: Pre-admission baseline Walks in Home: Needs assistance Is this a change from baseline?: Change from baseline, expected to last <3 days Does the patient have difficulty walking or climbing stairs?: Yes Weakness of Legs: Both Weakness of Arms/Hands: None  Permission Sought/Granted                  Emotional Assessment              Admission diagnosis:  Hemoptysis [R04.2] Aspiration pneumonia of both lungs, unspecified aspiration pneumonia type, unspecified part of lung ([place]) [J69.0] 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 heart failure) ([place]) [I50.33] Patient Active Problem List   Diagnosis Date Noted   Hemoptysis 09/07/2022   Hypotension 09/07/2022   Cirrhosis ([place]) 09/07/2022   Acute on chronic respiratory failure with hypoxia ([place]) 09/07/2022   Severe sepsis ([place]) 08/29/2022   Acute respiratory failure with hypoxia ([place]) 08/29/2022   Aspiration  pneumonia ([place]) 08/29/2022   AKI (acute kidney injury) ([place]) 08/29/2022   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 heart failure) ([place]) 08/10/2022   Permanent atrial fibrillation ([place]) 08/07/2022   Hypokalemia 08/07/2022   Pleural effusion due to CHF (congestive heart failure) ([place]) 04/11/2022   Iron deficiency anemia 04/11/2022   Anemia of chronic disease 02/18/2022   Essential hypertension 02/18/2022   Hypothyroidism 02/18/2022   Dyslipidemia 02/18/2022   CAP (community acquired pneumonia)  02/18/2022   Wound of foot 01/12/2021   Right ventricular dysfunction 10/27/2020   Acquired thrombophilia (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/18/2019   Neck pain 11/18/2019   Personal history of skin cancer 05/21/2019   Chronic anticoagulation 12/11/2016   Former tobacco use 12/11/2016   Malignant neoplasm of lower lobe of right lung ([place]) 11/28/2016   Aortic atherosclerosis ([place]) 10/07/2016   Hypothyroidism (acquired) 10/07/2016   Obstructive sleep apnea syndrome 10/07/2016   Thrombocytopenia ([place]) 10/07/2016   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   Mitral regurgitation 09/20/2016   Acute bronchitis 09/13/2016   Leukocytosis 09/13/2016   Venous stasis dermatitis 09/13/2016   LFT elevation    Alcoholic gastritis    Atrial fibrillation with RVR ([place]) 09/11/2016   Anxiety, mild 05/23/2016   Hematochezia 05/23/2016   History of hypothyroidism 05/23/2016   Chronic atrial fibrillation ([place]) 05/03/2016   Ruptured varicose vein 01/25/2016   Varicose veins of both lower extremities with complications 81/07/7508   Mitral valve insufficiency 08/25/2014   Inguinal hernia 02/18/2013   Actinic keratosis 01/21/2010   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 [address] [address] Phone: [phone] Fax: [phone]     Social Determinants of Health (SDOH) Social History: [place]: No Food Insecurity (09/08/2022)  Housing: Low Risk  (09/08/2022)  Transportation Needs: No Transportation Needs (09/08/2022)  Utilities: Not At Risk (09/08/2022)  Tobacco Use: Medium Risk (09/08/2022)   SDOH Interventions:     Readmission  Risk Interventions     No data to display

## 2022-09-09 NOTE — Progress Notes (Addendum)
Progress Note    Louis Sanchez  JJK:[id number] DOB: 04-23-1941  DOA: 09/07/2022 PCP: Adin Hector, MD      Brief Narrative:    Medical records reviewed and are as summarized below:  Louis Sanchez is a 82 y.o. 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       Assessment/Plan:   Principal Problem:   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 heart failure) (HCC) Active Problems:   Hemoptysis   Hypotension   Chronic atrial fibrillation (HCC)   Hypokalemia   Cirrhosis (HCC)   Acute on chronic respiratory failure with hypoxia (HCC)    Body mass index is 24.37 kg/m.   Hemoptysis: Resolved.  No further workup from pulmonologist.  Case discussed with Dr. Lanney Gins, pulmonologist, who recommended discontinuing sputum AFB, sputum culture tests.  Influenza, RSV and COVID tests were negative   Hypotension: Improved.  Continue midodrine     Acute on chronic hypoxic respiratory failure: He was previously on oxygen via heated humidified HFNC.  He is tolerating 2 L/min oxygen via [place] which is his baseline   Hypokalemia: Potassium improved to 3.4.  Continue potassium repletion.      Chronic right pleural effusion: Chest imaging reviewed by interventional radiologist.  Right effusion is felt to be chronic and not amenable to thoracentesis.   Liver cirrhosis: Continue Lasix and Aldactone   Acute on chronic diastolic CHF: He was given IV Lasix in the emergency department.  Continue Lasix and Aldactone.  Infiltrates on CT chest likely due to edema.   2D echo in September 2023 showed EF estimated at 60 to 65%, normal LV diastolic parameters, moderate TR, moderate MR, mild to moderate TR   Permanent atrial fibrillation: Continue Xarelto     Diet Order             Diet - low  sodium heart healthy           Diet 2 gram sodium Room service appropriate? Yes; Fluid consistency: Thin  Diet effective now                            Consultants: Pulmonologist  Procedures: None    Medications:    ferrous sulfate  325 mg Oral QHS   furosemide  40 mg Oral BID   gabapentin  300 mg Oral TID   levothyroxine  175 mcg Oral Once per day on Mon Tue Wed Thu Fri Sat   metoprolol succinate  12.5 mg Oral QHS   midodrine  5 mg Oral TID WC   potassium chloride  40 mEq Oral Once   Rivaroxaban  15 mg Oral Q supper   rosuvastatin  10 mg Oral QHS   sertraline  150 mg Oral Daily   spironolactone  25 mg Oral Daily   tiotropium  18 mcg Inhalation Daily   Continuous Infusions:     Anti-infectives (From admission, onward)    Start     Dose/Rate Route Frequency Ordered Stop   09/07/22 1800  cefTRIAXone (ROCEPHIN) 1 g in sodium chloride 0.9 % 100 mL IVPB  Status:  Discontinued        1 g 200 mL/hr over 30 Minutes Intravenous Every 24 hours 09/07/22 1709 09/08/22 1456   09/07/22 1800  azithromycin (ZITHROMAX)  500 mg in sodium chloride 0.9 % 250 mL IVPB  Status:  Discontinued        500 mg 250 mL/hr over 60 Minutes Intravenous Every 24 hours 09/07/22 1709 09/08/22 1456   09/07/22 1245  piperacillin-tazobactam (ZOSYN) IVPB 3.375 g  Status:  Discontinued        3.375 g 100 mL/hr over 30 Minutes Intravenous  Once 09/07/22 1231 09/07/22 1301              Family Communication/Anticipated D/C date and plan/Code Status   DVT prophylaxis:  Rivaroxaban (XARELTO) tablet 15 mg     Code Status: Full Code  Family Communication: I called Herbie [place], son, and 09/09/2022 at 2:50 PM  but there was no response Disposition Plan: Plan to discharge to ALF on 09/12/2022   Status is: Inpatient Remains inpatient appropriate because: Awaiting placement         Subjective:   Interval events noted.  He feels better today.  No shortness of breath or chest  pain.  Objective:    Vitals:   09/08/22 2349 09/09/22 0345 09/09/22 0827 09/09/22 1152  BP: 104/64 93/79  108/85  Pulse: 77 80  93  Resp: 20 18  19   Temp: 97.7 F (36.5 C) 97.8 F (36.6 C)  (!) 97.4 F (36.3 C)  TempSrc:  Oral  Oral  SpO2: 100% 98%    Weight:   68.5 kg    No data found.   Intake/Output Summary (Last 24 hours) at 09/09/2022 1634 Last data filed at 09/09/2022 1407 Gross per 24 hour  Intake 480 ml  Output 1450 ml  Net -970 ml   Filed Weights   09/09/22 0827  Weight: 68.5 kg    Exam:   GEN: NAD SKIN: Warm and dry.  Skin tear on right shoulder/arm.  Deep tissue injury sacrum, erythema of scrotum EYES: No pallor or icterus ENT: MMM CV: RRR PULM: CTA B ABD: soft, ND, NT, +BS CNS: AAO x 3, non focal EXT: Chronic wounds on the dorsal aspect of bilateral feet        Pressure Injury 08/30/22 Coccyx Mid Deep Tissue Pressure Injury - Purple or maroon localized area of discolored intact skin or blood-filled blister due to damage of underlying soft tissue from pressure and/or shear. intact purple wound (Active)  08/30/22 1635  Location: Coccyx  Location Orientation: Mid  Staging: Deep Tissue Pressure Injury - Purple or maroon localized area of discolored intact skin or blood-filled blister due to damage of underlying soft tissue from pressure and/or shear.  Wound Description (Comments): intact purple wound  Present on Admission: Yes  Dressing Type Foam - Lift dressing to assess site every shift 09/03/22 4132     Data Reviewed:   I have personally reviewed following labs and imaging studies:  Labs: Labs show the following:   Basic Metabolic Panel: Recent Labs  Lab 09/07/22 0932 09/08/22 0440 09/08/22 0727 09/09/22 0515 09/09/22 1322  NA 136 137  --  135  --   K 3.6 2.8*  --  3.0* 3.4*  CL 93* 95*  --  91*  --   CO2 33* 31  --  35*  --   GLUCOSE 111* 94  --  114*  --   BUN 28* 24*  --  21  --   CREATININE 1.30* 1.20  --  1.14  --    CALCIUM 8.1* 7.6*  --  7.9*  --   MG  --   --  2.1  --   --  PHOS  --   --  3.2  --   --    GFR Estimated Creatinine Clearance: 46.6 mL/min (by C-G formula based on SCr of 1.14 mg/dL). Liver Function Tests: No results for input(s): "AST", "ALT", "ALKPHOS", "BILITOT", "PROT", "ALBUMIN" in the last 168 hours. No results for input(s): "LIPASE", "AMYLASE" in the last 168 hours. No results for input(s): "AMMONIA" in the last 168 hours. Coagulation profile Recent Labs  Lab 09/07/22 0932 09/08/22 0440  INR 3.4* 2.1*    CBC: Recent Labs  Lab 09/07/22 0932 09/07/22 1221 09/08/22 0440 09/09/22 0515  WBC 10.1 8.3 6.3 6.6  NEUTROABS  --   --   --  5.4  HGB 10.9* 10.3* 9.7* 10.6*  HCT 35.7* 33.7* 32.5* 35.3*  MCV 92.5 92.6 93.9 93.4  PLT 164 149* 136* 153   Cardiac Enzymes: No results for input(s): "CKTOTAL", "CKMB", "CKMBINDEX", "TROPONINI" in the last 168 hours. BNP (last 3 results) No results for input(s): "PROBNP" in the last 8760 hours. CBG: Recent Labs  Lab 09/08/22 2006  GLUCAP 149*   D-Dimer: No results for input(s): "DDIMER" in the last 72 hours. Hgb A1c: No results for input(s): "HGBA1C" in the last 72 hours. Lipid Profile: No results for input(s): "CHOL", "HDL", "LDLCALC", "TRIG", "CHOLHDL", "LDLDIRECT" in the last 72 hours. Thyroid function studies: No results for input(s): "TSH", "T4TOTAL", "T3FREE", "THYROIDAB" in the last 72 hours.  Invalid input(s): "FREET3" Anemia work up: No results for input(s): "VITAMINB12", "FOLATE", "FERRITIN", "TIBC", "IRON", "RETICCTPCT" in the last 72 hours. Sepsis Labs: Recent Labs  Lab 09/07/22 0932 09/07/22 1221 09/07/22 1240 09/08/22 0440 09/09/22 0515  WBC 10.1 8.3  --  6.3 6.6  LATICACIDVEN  --   --  1.5  --   --     Microbiology Recent Results (from the past 240 hour(s))  Blood culture (routine x 2)     Status: None (Preliminary result)   Collection Time: 09/07/22 12:40 PM   Specimen: BLOOD  Result Value Ref  Range Status   Specimen Description BLOOD RIGHT ARM  Final   Special Requests   Final    BOTTLES DRAWN AEROBIC AND ANAEROBIC Blood Culture results may not be optimal due to an excessive volume of blood received in culture bottles   Culture   Final    NO GROWTH 2 DAYS Performed at [facility], [address] [place], [place] [address]    Report Status PENDING  Incomplete  Blood culture (routine x 2)     Status: None (Preliminary result)   Collection Time: 09/07/22  3:03 PM   Specimen: BLOOD  Result Value Ref Range Status   Specimen Description BLOOD LEFT ANTECUBITAL  Final   Special Requests   Final    BOTTLES DRAWN AEROBIC AND ANAEROBIC Blood Culture adequate volume   Culture   Final    NO GROWTH < 12 HOURS Performed at [facility], [address] [place], [place] [address]    Report Status PENDING  Incomplete  MRSA Next Gen by PCR, Nasal     Status: None   Collection Time: 09/08/22  9:10 PM   Specimen: Anterior Nasal Swab  Result Value Ref Range Status   MRSA by PCR Next Gen NOT DETECTED NOT DETECTED Final    Comment: (NOTE) The GeneXpert MRSA Assay (FDA approved for NASAL specimens only), is one component of a comprehensive MRSA colonization surveillance program. It is not intended to diagnose MRSA infection nor to guide or monitor treatment for  MRSA infections. Test performance is not FDA approved in patients less than 53 years old. Performed at [facility], [address] [place], [place] [address]   Resp panel by RT-PCR (RSV, Flu A&B, Covid) Anterior Nasal Swab     Status: None   Collection Time: 09/09/22  9:40 AM   Specimen: Anterior Nasal Swab  Result Value Ref Range Status   SARS Coronavirus 2 by RT PCR NEGATIVE NEGATIVE Final    Comment: (NOTE) SARS-CoV-2 target nucleic acids are NOT DETECTED.  The SARS-CoV-2 RNA is generally detectable in upper respiratory specimens during the acute phase of infection. The lowest concentration  of SARS-CoV-2 viral copies this assay can detect is 138 copies/mL. A negative result does not preclude SARS-Cov-2 infection and should not be used as the sole basis for treatment or other patient management decisions. A negative result may occur with  improper specimen collection/handling, submission of specimen other than nasopharyngeal swab, presence of viral mutation(s) within the areas targeted by this assay, and inadequate number of viral copies(<138 copies/mL). A negative result must be combined with clinical observations, patient history, and epidemiological information. The expected result is Negative.  Fact Sheet for Patients:  EntrepreneurPulse.com.au  Fact Sheet for Healthcare Providers:  IncredibleEmployment.be  This test is no t yet approved or cleared by the [place] FDA and  has been authorized for detection and/or diagnosis of SARS-CoV-2 by FDA under an Emergency Use Authorization (EUA). This EUA will remain  in effect (meaning this test can be used) for the duration of the COVID-19 declaration under Section 564(b)(1) of the Act, 21 U.S.C.section 360bbb-3(b)(1), unless the authorization is terminated  or revoked sooner.       Influenza A by PCR NEGATIVE NEGATIVE Final   Influenza B by PCR NEGATIVE NEGATIVE Final    Comment: (NOTE) The Xpert Xpress SARS-CoV-2/FLU/RSV plus assay is intended as an aid in the diagnosis of influenza from Nasopharyngeal swab specimens and should not be used as a sole basis for treatment. Nasal washings and aspirates are unacceptable for Xpert Xpress SARS-CoV-2/FLU/RSV testing.  Fact Sheet for Patients: EntrepreneurPulse.com.au  Fact Sheet for Healthcare Providers: IncredibleEmployment.be  This test is not yet approved or cleared by the [place] FDA and has been authorized for detection and/or diagnosis of SARS-CoV-2 by FDA under an Emergency Use  Authorization (EUA). This EUA will remain in effect (meaning this test can be used) for the duration of the COVID-19 declaration under Section 564(b)(1) of the Act, 21 U.S.C. section 360bbb-3(b)(1), unless the authorization is terminated or revoked.     Resp Syncytial Virus by PCR NEGATIVE NEGATIVE Final    Comment: (NOTE) Fact Sheet for Patients: EntrepreneurPulse.com.au  Fact Sheet for Healthcare Providers: IncredibleEmployment.be  This test is not yet approved or cleared by the [place] FDA and has been authorized for detection and/or diagnosis of SARS-CoV-2 by FDA under an Emergency Use Authorization (EUA). This EUA will remain in effect (meaning this test can be used) for the duration of the COVID-19 declaration under Section 564(b)(1) of the Act, 21 U.S.C. section 360bbb-3(b)(1), unless the authorization is terminated or revoked.  Performed at [facility], [address] [place], [place] [address]     Procedures and diagnostic studies:  DG Chest 1 View  Result Date: 09/07/2022 CLINICAL DATA:  Short of breath.  History of lung cancer EXAM: CHEST  1 VIEW COMPARISON:  09/07/2022 FINDINGS: Anterior cervical fusion. Stable enlarged cardiac silhouette. Bilateral pleural effusions with low lung volumes. Consolidation at  the RIGHT lung base unchanged. No pneumothorax. IMPRESSION: 1. No significant interval change. 2. Bilateral pleural effusions and RIGHT basilar consolidation. 3. Cardiomegaly. Electronically Signed   By: Suzy Bouchard M.D.   On: 09/07/2022 19:26               LOS: 1 day   Alya Smaltz  [facility]   Pager on [email or web address]. If 7PM-7AM, please contact night-coverage at [email or web address]     09/09/2022, 4:34 PM

## 2022-09-10 DIAGNOSIS — I5033 Acute on chronic diastolic (congestive) heart failure: Secondary | ICD-10-CM | POA: Diagnosis not present

## 2022-09-10 DIAGNOSIS — J9621 Acute and chronic respiratory failure with hypoxia: Secondary | ICD-10-CM | POA: Diagnosis not present

## 2022-09-10 NOTE — Plan of Care (Signed)

## 2022-09-10 NOTE — Progress Notes (Addendum)
Progress Note    Louis Sanchez  QBH:[id number] DOB: February 14, 1941  DOA: 09/07/2022 PCP: Louis Hector, MD      Brief Narrative:    Medical records reviewed and are as summarized below:  Louis Sanchez is a 82 y.o. 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       Assessment/Plan:   Principal Problem:   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 heart failure) (HCC) Active Problems:   Hemoptysis   Hypotension   Chronic atrial fibrillation (HCC)   Hypokalemia   Cirrhosis (HCC)   Acute on chronic respiratory failure with hypoxia (HCC)    Body mass index is 23.44 kg/m.   Hemoptysis: Resolved.  No further workup from pulmonologist.   Influenza, RSV and COVID tests were negative   Hypotension: Improved.  Continue midodrine     Acute on chronic hypoxic respiratory failure: He was previously on oxygen via heated humidified HFNC.  He is tolerating 2 L/min oxygen via [place] which is his baseline   Hypokalemia: Potassium improved to 3.4.  Check potassium level tomorrow   Chronic right pleural effusion: Chest imaging reviewed by interventional radiologist.  Right effusion is felt to be chronic and not amenable to thoracentesis.   Liver cirrhosis: Continue Aldactone and Lasix   Acute on chronic diastolic CHF: He was given IV Lasix in the emergency department.  Continue Aldactone and Lasix.  Infiltrates on CT chest likely due to edema.   2D echo in September 2023 showed EF estimated at 60 to 65%, normal LV diastolic parameters, moderate TR, moderate MR, mild to moderate TR   Permanent atrial fibrillation: Continue Xarelto     Diet Order             Diet - low sodium heart healthy           Diet 2 gram sodium Room service appropriate? Yes; Fluid consistency: Thin  Diet  effective now                            Consultants: Pulmonologist  Procedures: None    Medications:    ferrous sulfate  325 mg Oral QHS   furosemide  40 mg Oral BID   gabapentin  300 mg Oral TID   levothyroxine  175 mcg Oral Once per day on Mon Tue Wed Thu Fri Sat   metoprolol succinate  12.5 mg Oral QHS   midodrine  5 mg Oral TID WC   Rivaroxaban  15 mg Oral Q supper   rosuvastatin  10 mg Oral QHS   sertraline  150 mg Oral Daily   spironolactone  25 mg Oral Daily   tiotropium  18 mcg Inhalation Daily   Continuous Infusions:     Anti-infectives (From admission, onward)    Start     Dose/Rate Route Frequency Ordered Stop   09/07/22 1800  cefTRIAXone (ROCEPHIN) 1 g in sodium chloride 0.9 % 100 mL IVPB  Status:  Discontinued        1 g 200 mL/hr over 30 Minutes Intravenous Every 24 hours 09/07/22 1709 09/08/22 1456   09/07/22 1800  azithromycin (ZITHROMAX) 500 mg in sodium chloride 0.9 % 250 mL IVPB  Status:  Discontinued        500 mg 250 mL/hr  over 60 Minutes Intravenous Every 24 hours 09/07/22 1709 09/08/22 1456   09/07/22 1245  piperacillin-tazobactam (ZOSYN) IVPB 3.375 g  Status:  Discontinued        3.375 g 100 mL/hr over 30 Minutes Intravenous  Once 09/07/22 1231 09/07/22 1301              Family Communication/Anticipated D/C date and plan/Code Status   DVT prophylaxis:  Rivaroxaban (XARELTO) tablet 15 mg     Code Status: DNR chart review showed that patient has a DNR form that was completed on 04/12/2022.  Discussed CODE STATUS with the patient and he confirmed that he is DNR.  CODE STATUS has been changed from full code to DNR per patient's wishes.  Family Communication: I called Herbie [place], son, and 09/09/2022 at 2:50 PM  but there was no response Disposition Plan: Plan to discharge to ALF on 09/12/2022   Status is: Inpatient Remains inpatient appropriate because: Awaiting placement         Subjective:   Interval events  noted.  He has no complaints.  He is disappointed that he is not able to go to ALF until Monday, 09/12/2022.  Objective:    Vitals:   09/10/22 0459 09/10/22 0749 09/10/22 0929 09/10/22 1314  BP: (!) 104/59 99/63  104/64  Pulse: 83 78  71  Resp: 20 18  18   Temp: 97.8 F (36.6 C) 97.7 F (36.5 C)  (!) 97.4 F (36.3 C)  TempSrc: Oral Oral    SpO2: 99% 100%  100%  Weight:   65.9 kg    No data found.   Intake/Output Summary (Last 24 hours) at 09/10/2022 1527 Last data filed at 09/10/2022 1312 Gross per 24 hour  Intake --  Output 2450 ml  Net -2450 ml   Filed Weights   09/09/22 0827 09/10/22 0929  Weight: 68.5 kg 65.9 kg    Exam:   GEN: NAD SKIN: Warm and dry. EYES: EOMI ENT: MMM CV: RRR PULM: CTA B ABD: soft, ND, NT, +BS CNS: AAO x 3, non focal EXT: No edema or tenderness.  Chronic wounds on dorsal aspect of bilateral feet      Pressure Injury 08/30/22 Coccyx Mid Deep Tissue Pressure Injury - Purple or maroon localized area of discolored intact skin or blood-filled blister due to damage of underlying soft tissue from pressure and/or shear. intact purple wound (Active)  08/30/22 1635  Location: Coccyx  Location Orientation: Mid  Staging: Deep Tissue Pressure Injury - Purple or maroon localized area of discolored intact skin or blood-filled blister due to damage of underlying soft tissue from pressure and/or shear.  Wound Description (Comments): intact purple wound  Present on Admission: Yes  Dressing Type Foam - Lift dressing to assess site every shift 09/10/22 0758     Data Reviewed:   I have personally reviewed following labs and imaging studies:  Labs: Labs show the following:   Basic Metabolic Panel: Recent Labs  Lab 09/07/22 0932 09/08/22 0440 09/08/22 0727 09/09/22 0515 09/09/22 1322  NA 136 137  --  135  --   K 3.6 2.8*  --  3.0* 3.4*  CL 93* 95*  --  91*  --   CO2 33* 31  --  35*  --   GLUCOSE 111* 94  --  114*  --   BUN 28* 24*  --  21   --   CREATININE 1.30* 1.20  --  1.14  --   CALCIUM 8.1* 7.6*  --  7.9*  --   MG  --   --  2.1  --   --   PHOS  --   --  3.2  --   --    GFR Estimated Creatinine Clearance: 46.6 mL/min (by C-G formula based on SCr of 1.14 mg/dL). Liver Function Tests: No results for input(s): "AST", "ALT", "ALKPHOS", "BILITOT", "PROT", "ALBUMIN" in the last 168 hours. No results for input(s): "LIPASE", "AMYLASE" in the last 168 hours. No results for input(s): "AMMONIA" in the last 168 hours. Coagulation profile Recent Labs  Lab 09/07/22 0932 09/08/22 0440  INR 3.4* 2.1*    CBC: Recent Labs  Lab 09/07/22 0932 09/07/22 1221 09/08/22 0440 09/09/22 0515  WBC 10.1 8.3 6.3 6.6  NEUTROABS  --   --   --  5.4  HGB 10.9* 10.3* 9.7* 10.6*  HCT 35.7* 33.7* 32.5* 35.3*  MCV 92.5 92.6 93.9 93.4  PLT 164 149* 136* 153   Cardiac Enzymes: No results for input(s): "CKTOTAL", "CKMB", "CKMBINDEX", "TROPONINI" in the last 168 hours. BNP (last 3 results) No results for input(s): "PROBNP" in the last 8760 hours. CBG: Recent Labs  Lab 09/08/22 2006  GLUCAP 149*   D-Dimer: No results for input(s): "DDIMER" in the last 72 hours. Hgb A1c: No results for input(s): "HGBA1C" in the last 72 hours. Lipid Profile: No results for input(s): "CHOL", "HDL", "LDLCALC", "TRIG", "CHOLHDL", "LDLDIRECT" in the last 72 hours. Thyroid function studies: No results for input(s): "TSH", "T4TOTAL", "T3FREE", "THYROIDAB" in the last 72 hours.  Invalid input(s): "FREET3" Anemia work up: No results for input(s): "VITAMINB12", "FOLATE", "FERRITIN", "TIBC", "IRON", "RETICCTPCT" in the last 72 hours. Sepsis Labs: Recent Labs  Lab 09/07/22 0932 09/07/22 1221 09/07/22 1240 09/08/22 0440 09/09/22 0515  WBC 10.1 8.3  --  6.3 6.6  LATICACIDVEN  --   --  1.5  --   --     Microbiology Recent Results (from the past 240 hour(s))  Blood culture (routine x 2)     Status: None (Preliminary result)   Collection Time: 09/07/22  12:40 PM   Specimen: BLOOD  Result Value Ref Range Status   Specimen Description BLOOD RIGHT ARM  Final   Special Requests   Final    BOTTLES DRAWN AEROBIC AND ANAEROBIC Blood Culture results may not be optimal due to an excessive volume of blood received in culture bottles   Culture   Final    NO GROWTH 3 DAYS Performed at [facility], [address], [place], [place] [address]    Report Status PENDING  Incomplete  Blood culture (routine x 2)     Status: None (Preliminary result)   Collection Time: 09/07/22  3:03 PM   Specimen: BLOOD  Result Value Ref Range Status   Specimen Description BLOOD LEFT ANTECUBITAL  Final   Special Requests   Final    BOTTLES DRAWN AEROBIC AND ANAEROBIC Blood Culture adequate volume   Culture   Final    NO GROWTH 2 DAYS Performed at [facility], [address], [place], [place] [address]    Report Status PENDING  Incomplete  MRSA Next Gen by PCR, Nasal     Status: None   Collection Time: 09/08/22  9:10 PM   Specimen: Anterior Nasal Swab  Result Value Ref Range Status   MRSA by PCR Next Gen NOT DETECTED NOT DETECTED Final    Comment: (NOTE) The GeneXpert MRSA Assay (FDA approved for NASAL specimens only), is one component of a comprehensive  MRSA colonization surveillance program. It is not intended to diagnose MRSA infection nor to guide or monitor treatment for MRSA infections. Test performance is not FDA approved in patients less than 26 years old. Performed at [facility], [address] [place], [place] [address]   Resp panel by RT-PCR (RSV, Flu A&B, Covid) Anterior Nasal Swab     Status: None   Collection Time: 09/09/22  9:40 AM   Specimen: Anterior Nasal Swab  Result Value Ref Range Status   SARS Coronavirus 2 by RT PCR NEGATIVE NEGATIVE Final    Comment: (NOTE) SARS-CoV-2 target nucleic acids are NOT DETECTED.  The SARS-CoV-2 RNA is generally detectable in upper respiratory specimens during the acute  phase of infection. The lowest concentration of SARS-CoV-2 viral copies this assay can detect is 138 copies/mL. A negative result does not preclude SARS-Cov-2 infection and should not be used as the sole basis for treatment or other patient management decisions. A negative result may occur with  improper specimen collection/handling, submission of specimen other than nasopharyngeal swab, presence of viral mutation(s) within the areas targeted by this assay, and inadequate number of viral copies(<138 copies/mL). A negative result must be combined with clinical observations, patient history, and epidemiological information. The expected result is Negative.  Fact Sheet for Patients:  EntrepreneurPulse.com.au  Fact Sheet for Healthcare Providers:  IncredibleEmployment.be  This test is no t yet approved or cleared by the [place] FDA and  has been authorized for detection and/or diagnosis of SARS-CoV-2 by FDA under an Emergency Use Authorization (EUA). This EUA will remain  in effect (meaning this test can be used) for the duration of the COVID-19 declaration under Section 564(b)(1) of the Act, 21 U.S.C.section 360bbb-3(b)(1), unless the authorization is terminated  or revoked sooner.       Influenza A by PCR NEGATIVE NEGATIVE Final   Influenza B by PCR NEGATIVE NEGATIVE Final    Comment: (NOTE) The Xpert Xpress SARS-CoV-2/FLU/RSV plus assay is intended as an aid in the diagnosis of influenza from Nasopharyngeal swab specimens and should not be used as a sole basis for treatment. Nasal washings and aspirates are unacceptable for Xpert Xpress SARS-CoV-2/FLU/RSV testing.  Fact Sheet for Patients: EntrepreneurPulse.com.au  Fact Sheet for Healthcare Providers: IncredibleEmployment.be  This test is not yet approved or cleared by the [place] FDA and has been authorized for detection and/or diagnosis of  SARS-CoV-2 by FDA under an Emergency Use Authorization (EUA). This EUA will remain in effect (meaning this test can be used) for the duration of the COVID-19 declaration under Section 564(b)(1) of the Act, 21 U.S.C. section 360bbb-3(b)(1), unless the authorization is terminated or revoked.     Resp Syncytial Virus by PCR NEGATIVE NEGATIVE Final    Comment: (NOTE) Fact Sheet for Patients: EntrepreneurPulse.com.au  Fact Sheet for Healthcare Providers: IncredibleEmployment.be  This test is not yet approved or cleared by the [place] FDA and has been authorized for detection and/or diagnosis of SARS-CoV-2 by FDA under an Emergency Use Authorization (EUA). This EUA will remain in effect (meaning this test can be used) for the duration of the COVID-19 declaration under Section 564(b)(1) of the Act, 21 U.S.C. section 360bbb-3(b)(1), unless the authorization is terminated or revoked.  Performed at [facility], [place], [place] [address]   Respiratory (~20 pathogens) panel by PCR     Status: None   Collection Time: 09/09/22  9:40 AM   Specimen: Anterior Nasal Swab; Respiratory  Result Value Ref Range  Status   Adenovirus NOT DETECTED NOT DETECTED Final   Coronavirus 229E NOT DETECTED NOT DETECTED Final    Comment: (NOTE) The Coronavirus on the Respiratory Panel, DOES NOT test for the novel  Coronavirus (2019 nCoV)    Coronavirus HKU1 NOT DETECTED NOT DETECTED Final   Coronavirus NL63 NOT DETECTED NOT DETECTED Final   Coronavirus OC43 NOT DETECTED NOT DETECTED Final   Metapneumovirus NOT DETECTED NOT DETECTED Final   Rhinovirus / Enterovirus NOT DETECTED NOT DETECTED Final   Influenza A NOT DETECTED NOT DETECTED Final   Influenza B NOT DETECTED NOT DETECTED Final   Parainfluenza Virus 1 NOT DETECTED NOT DETECTED Final   Parainfluenza Virus 2 NOT DETECTED NOT DETECTED Final   Parainfluenza Virus 3 NOT DETECTED NOT  DETECTED Final   Parainfluenza Virus 4 NOT DETECTED NOT DETECTED Final   Respiratory Syncytial Virus NOT DETECTED NOT DETECTED Final   Bordetella pertussis NOT DETECTED NOT DETECTED Final   Bordetella Parapertussis NOT DETECTED NOT DETECTED Final   Chlamydophila pneumoniae NOT DETECTED NOT DETECTED Final   Mycoplasma pneumoniae NOT DETECTED NOT DETECTED Final    Comment: Performed at [facility], [place] [address], [place], [place] [address]    Procedures and diagnostic studies:  No results found.             LOS: 2 days   Onetha Gaffey  [facility]   Pager on [email or web address]. If 7PM-7AM, please contact night-coverage at [email or web address]     09/10/2022, 3:27 PM

## 2022-09-11 DIAGNOSIS — J9621 Acute and chronic respiratory failure with hypoxia: Secondary | ICD-10-CM | POA: Diagnosis not present

## 2022-09-11 DIAGNOSIS — I5033 Acute on chronic diastolic (congestive) heart failure: Secondary | ICD-10-CM | POA: Diagnosis not present

## 2022-09-11 LAB — BASIC METABOLIC PANEL
Anion gap: 4 — ABNORMAL LOW (ref 5–15)
BUN: 17 mg/dL (ref 8–23)
CO2: 42 mmol/L — ABNORMAL HIGH (ref 22–32)
Calcium: 7.9 mg/dL — ABNORMAL LOW (ref 8.9–10.3)
Chloride: 92 mmol/L — ABNORMAL LOW (ref 98–111)
Creatinine, Ser: 1.06 mg/dL (ref 0.61–1.24)
GFR, Estimated: >60 mL/min (ref >60)
Glucose, Bld: 108 mg/dL — ABNORMAL HIGH (ref 70–99)
Potassium: 3.6 mmol/L (ref 3.5–5.1)
Sodium: 138 mmol/L (ref 135–145)

## 2022-09-11 LAB — MAGNESIUM: Magnesium: 2.2 mg/dL (ref 1.7–2.4)

## 2022-09-11 MED ORDER — POTASSIUM CHLORIDE CRYS ER 20 MEQ PO TBCR
40.0000 meq | EXTENDED_RELEASE_TABLET | Freq: Once | ORAL | Status: AC
  Administered 2022-09-11: 40 meq via ORAL
  Filled 2022-09-11: qty 2

## 2022-09-11 NOTE — Progress Notes (Signed)
Progress Note    Louis Sanchez  GYF:[id number] DOB: 1941/02/27  DOA: 09/07/2022 PCP: Adin Hector, MD      Brief Narrative:    Medical records reviewed and are as summarized below:  Louis Sanchez is a 82 y.o. 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       Assessment/Plan:   Principal Problem:   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 heart failure) (HCC) Active Problems:   Hemoptysis   Hypotension   Chronic atrial fibrillation (HCC)   Hypokalemia   Cirrhosis (HCC)   Acute on chronic respiratory failure with hypoxia (HCC)    Body mass index is 22.11 kg/m.   Hemoptysis: Resolved.  No further workup from pulmonologist.   Influenza, RSV and COVID tests were negative   Chronic hypotension: Continue midodrine   Acute on chronic hypoxic respiratory failure: He was previously on oxygen via heated humidified HFNC.  He is tolerating 2 L/min oxygen via Short which is his baseline   Hypokalemia: Improved.  Continue potassium repletion.   Chronic right pleural effusion: Chest imaging reviewed by interventional radiologist.  Right effusion is felt to be chronic and not amenable to thoracentesis.   Liver cirrhosis: Continue Aldactone and Lasix   Acute on chronic diastolic CHF: He was given IV Lasix in the emergency department.  Continue Aldactone and Lasix.  Infiltrates on CT chest likely due to edema.   2D echo in September 2023 showed EF estimated at 60 to 65%, normal LV diastolic parameters, moderate TR, moderate MR, mild to moderate TR   Permanent atrial fibrillation: Continue Xarelto     Diet Order             Diet - low sodium heart healthy           Diet 2 gram sodium Room service appropriate? Yes; Fluid consistency: Thin  Diet effective now                             Consultants: Pulmonologist  Procedures: None    Medications:    ferrous sulfate  325 mg Oral QHS   furosemide  40 mg Oral BID   gabapentin  300 mg Oral TID   levothyroxine  175 mcg Oral Once per day on Mon Tue Wed Thu Fri Sat   metoprolol succinate  12.5 mg Oral QHS   midodrine  5 mg Oral TID WC   Rivaroxaban  15 mg Oral Q supper   rosuvastatin  10 mg Oral QHS   sertraline  150 mg Oral Daily   spironolactone  25 mg Oral Daily   tiotropium  18 mcg Inhalation Daily   Continuous Infusions:     Anti-infectives (From admission, onward)    Start     Dose/Rate Route Frequency Ordered Stop   09/07/22 1800  cefTRIAXone (ROCEPHIN) 1 g in sodium chloride 0.9 % 100 mL IVPB  Status:  Discontinued        1 g 200 mL/hr over 30 Minutes Intravenous Every 24 hours 09/07/22 1709 09/08/22 1456   09/07/22 1800  azithromycin (ZITHROMAX) 500 mg in sodium chloride 0.9 % 250 mL IVPB  Status:  Discontinued        500 mg 250 mL/hr over 60 Minutes Intravenous Every 24 hours  09/07/22 1709 09/08/22 1456   09/07/22 1245  piperacillin-tazobactam (ZOSYN) IVPB 3.375 g  Status:  Discontinued        3.375 g 100 mL/hr over 30 Minutes Intravenous  Once 09/07/22 1231 09/07/22 1301              Family Communication/Anticipated D/C date and plan/Code Status   DVT prophylaxis:  Rivaroxaban (XARELTO) tablet 15 mg     Code Status: DNR chart review showed that patient has a DNR form that was completed on 04/12/2022.  Discussed CODE STATUS with the patient and he confirmed that he is DNR.  CODE STATUS has been changed from full code to DNR per patient's wishes.  Family Communication: I called Herbie Baltimore, son, and 09/09/2022 at 2:50 PM  but there was no response Disposition Plan: Plan to discharge to ALF on 09/12/2022   Status is: Inpatient Remains inpatient appropriate because: Awaiting placement         Subjective:   He has no complaints.  No shortness of breath,  cough or chest pain.  He feels well.  Objective:    Vitals:   09/11/22 0439 09/11/22 0748 09/11/22 0941 09/11/22 1151  BP: 93/60 (!) 94/55  (!) 98/58  Pulse: 77 75  75  Resp: 16 14  18   Temp: 98.2 F (36.8 C) (!) 97.5 F (36.4 C)  98 F (36.7 C)  TempSrc: Oral Oral  Oral  SpO2: 100% 100%  97%  Weight:   62.1 kg    No data found.   Intake/Output Summary (Last 24 hours) at 09/11/2022 1452 Last data filed at 09/11/2022 0453 Gross per 24 hour  Intake 240 ml  Output 1800 ml  Net -1560 ml   Filed Weights   09/09/22 0827 09/10/22 0929 09/11/22 0941  Weight: 68.5 kg 65.9 kg 62.1 kg    Exam:   GEN: NAD SKIN: Warm and dry.  Chronic wounds on dorsal aspect of bilateral feet EYES: Anicteric ENT: MMM CV: RRR PULM: CTA B ABD: soft, ND, NT, +BS CNS: AAO x 3, non focal EXT: No edema or tenderness     Pressure Injury 08/30/22 Coccyx Mid Deep Tissue Pressure Injury - Purple or maroon localized area of discolored intact skin or blood-filled blister due to damage of underlying soft tissue from pressure and/or shear. intact purple wound (Active)  08/30/22 1635  Location: Coccyx  Location Orientation: Mid  Staging: Deep Tissue Pressure Injury - Purple or maroon localized area of discolored intact skin or blood-filled blister due to damage of underlying soft tissue from pressure and/or shear.  Wound Description (Comments): intact purple wound  Present on Admission: Yes  Dressing Type Foam - Lift dressing to assess site every shift 09/11/22 0830     Data Reviewed:   I have personally reviewed following labs and imaging studies:  Labs: Labs show the following:   Basic Metabolic Panel: Recent Labs  Lab 09/07/22 0932 09/08/22 0440 09/08/22 0727 09/09/22 0515 09/09/22 1322 09/11/22 0456  NA 136 137  --  135  --  138  K 3.6 2.8*  --  3.0* 3.4* 3.6  CL 93* 95*  --  91*  --  92*  CO2 33* 31  --  35*  --  42*  GLUCOSE 111* 94  --  114*  --  108*  BUN 28* 24*  --  21  --   17  CREATININE 1.30* 1.20  --  1.14  --  1.06  CALCIUM 8.1* 7.6*  --  7.9*  --  7.9*  MG  --   --  2.1  --   --  2.2  PHOS  --   --  3.2  --   --   --    GFR Estimated Creatinine Clearance: 48.8 mL/min (by C-G formula based on SCr of 1.06 mg/dL). Liver Function Tests: No results for input(s): "AST", "ALT", "ALKPHOS", "BILITOT", "PROT", "ALBUMIN" in the last 168 hours. No results for input(s): "LIPASE", "AMYLASE" in the last 168 hours. No results for input(s): "AMMONIA" in the last 168 hours. Coagulation profile Recent Labs  Lab 09/07/22 0932 09/08/22 0440  INR 3.4* 2.1*    CBC: Recent Labs  Lab 09/07/22 0932 09/07/22 1221 09/08/22 0440 09/09/22 0515  WBC 10.1 8.3 6.3 6.6  NEUTROABS  --   --   --  5.4  HGB 10.9* 10.3* 9.7* 10.6*  HCT 35.7* 33.7* 32.5* 35.3*  MCV 92.5 92.6 93.9 93.4  PLT 164 149* 136* 153   Cardiac Enzymes: No results for input(s): "CKTOTAL", "CKMB", "CKMBINDEX", "TROPONINI" in the last 168 hours. BNP (last 3 results) No results for input(s): "PROBNP" in the last 8760 hours. CBG: Recent Labs  Lab 09/08/22 2006  GLUCAP 149*   D-Dimer: No results for input(s): "DDIMER" in the last 72 hours. Hgb A1c: No results for input(s): "HGBA1C" in the last 72 hours. Lipid Profile: No results for input(s): "CHOL", "HDL", "LDLCALC", "TRIG", "CHOLHDL", "LDLDIRECT" in the last 72 hours. Thyroid function studies: No results for input(s): "TSH", "T4TOTAL", "T3FREE", "THYROIDAB" in the last 72 hours.  Invalid input(s): "FREET3" Anemia work up: No results for input(s): "VITAMINB12", "FOLATE", "FERRITIN", "TIBC", "IRON", "RETICCTPCT" in the last 72 hours. Sepsis Labs: Recent Labs  Lab 09/07/22 0932 09/07/22 1221 09/07/22 1240 09/08/22 0440 09/09/22 0515  WBC 10.1 8.3  --  6.3 6.6  LATICACIDVEN  --   --  1.5  --   --     Microbiology Recent Results (from the past 240 hour(s))  Blood culture (routine x 2)     Status: None (Preliminary result)   Collection  Time: 09/07/22 12:40 PM   Specimen: BLOOD  Result Value Ref Range Status   Specimen Description BLOOD RIGHT ARM  Final   Special Requests   Final    BOTTLES DRAWN AEROBIC AND ANAEROBIC Blood Culture results may not be optimal due to an excessive volume of blood received in culture bottles   Culture   Final    NO GROWTH 4 DAYS Performed at [facility], [address] [place], [place] [address]    Report Status PENDING  Incomplete  Blood culture (routine x 2)     Status: None (Preliminary result)   Collection Time: 09/07/22  3:03 PM   Specimen: BLOOD  Result Value Ref Range Status   Specimen Description BLOOD LEFT ANTECUBITAL  Final   Special Requests   Final    BOTTLES DRAWN AEROBIC AND ANAEROBIC Blood Culture adequate volume   Culture   Final    NO GROWTH 3 DAYS Performed at [facility], [address] [place], [place] [address]    Report Status PENDING  Incomplete  MRSA Next Gen by PCR, Nasal     Status: None   Collection Time: 09/08/22  9:10 PM   Specimen: Anterior Nasal Swab  Result Value Ref Range Status   MRSA by PCR Next Gen NOT DETECTED NOT DETECTED Final    Comment: (NOTE) The GeneXpert MRSA Assay (FDA approved for NASAL specimens only), is  one component of a comprehensive MRSA colonization surveillance program. It is not intended to diagnose MRSA infection nor to guide or monitor treatment for MRSA infections. Test performance is not FDA approved in patients less than 44 years old. Performed at [facility], [address] [place], [place] [address]   Resp panel by RT-PCR (RSV, Flu A&B, Covid) Anterior Nasal Swab     Status: None   Collection Time: 09/09/22  9:40 AM   Specimen: Anterior Nasal Swab  Result Value Ref Range Status   SARS Coronavirus 2 by RT PCR NEGATIVE NEGATIVE Final    Comment: (NOTE) SARS-CoV-2 target nucleic acids are NOT DETECTED.  The SARS-CoV-2 RNA is generally detectable in upper respiratory specimens  during the acute phase of infection. The lowest concentration of SARS-CoV-2 viral copies this assay can detect is 138 copies/mL. A negative result does not preclude SARS-Cov-2 infection and should not be used as the sole basis for treatment or other patient management decisions. A negative result may occur with  improper specimen collection/handling, submission of specimen other than nasopharyngeal swab, presence of viral mutation(s) within the areas targeted by this assay, and inadequate number of viral copies(<138 copies/mL). A negative result must be combined with clinical observations, patient history, and epidemiological information. The expected result is Negative.  Fact Sheet for Patients:  EntrepreneurPulse.com.au  Fact Sheet for Healthcare Providers:  IncredibleEmployment.be  This test is no t yet approved or cleared by the [place] FDA and  has been authorized for detection and/or diagnosis of SARS-CoV-2 by FDA under an Emergency Use Authorization (EUA). This EUA will remain  in effect (meaning this test can be used) for the duration of the COVID-19 declaration under Section 564(b)(1) of the Act, 21 U.S.C.section 360bbb-3(b)(1), unless the authorization is terminated  or revoked sooner.       Influenza A by PCR NEGATIVE NEGATIVE Final   Influenza B by PCR NEGATIVE NEGATIVE Final    Comment: (NOTE) The Xpert Xpress SARS-CoV-2/FLU/RSV plus assay is intended as an aid in the diagnosis of influenza from Nasopharyngeal swab specimens and should not be used as a sole basis for treatment. Nasal washings and aspirates are unacceptable for Xpert Xpress SARS-CoV-2/FLU/RSV testing.  Fact Sheet for Patients: EntrepreneurPulse.com.au  Fact Sheet for Healthcare Providers: IncredibleEmployment.be  This test is not yet approved or cleared by the [place] FDA and has been authorized for detection  and/or diagnosis of SARS-CoV-2 by FDA under an Emergency Use Authorization (EUA). This EUA will remain in effect (meaning this test can be used) for the duration of the COVID-19 declaration under Section 564(b)(1) of the Act, 21 U.S.C. section 360bbb-3(b)(1), unless the authorization is terminated or revoked.     Resp Syncytial Virus by PCR NEGATIVE NEGATIVE Final    Comment: (NOTE) Fact Sheet for Patients: EntrepreneurPulse.com.au  Fact Sheet for Healthcare Providers: IncredibleEmployment.be  This test is not yet approved or cleared by the [place] FDA and has been authorized for detection and/or diagnosis of SARS-CoV-2 by FDA under an Emergency Use Authorization (EUA). This EUA will remain in effect (meaning this test can be used) for the duration of the COVID-19 declaration under Section 564(b)(1) of the Act, 21 U.S.C. section 360bbb-3(b)(1), unless the authorization is terminated or revoked.  Performed at [facility], [place], [place] [address]   Respiratory (~20 pathogens) panel by PCR     Status: None   Collection Time: 09/09/22  9:40 AM   Specimen: Anterior Nasal Swab; Respiratory  Result Value Ref Range Status   Adenovirus NOT DETECTED NOT DETECTED Final   Coronavirus 229E NOT DETECTED NOT DETECTED Final    Comment: (NOTE) The Coronavirus on the Respiratory Panel, DOES NOT test for the novel  Coronavirus (2019 nCoV)    Coronavirus HKU1 NOT DETECTED NOT DETECTED Final   Coronavirus NL63 NOT DETECTED NOT DETECTED Final   Coronavirus OC43 NOT DETECTED NOT DETECTED Final   Metapneumovirus NOT DETECTED NOT DETECTED Final   Rhinovirus / Enterovirus NOT DETECTED NOT DETECTED Final   Influenza A NOT DETECTED NOT DETECTED Final   Influenza B NOT DETECTED NOT DETECTED Final   Parainfluenza Virus 1 NOT DETECTED NOT DETECTED Final   Parainfluenza Virus 2 NOT DETECTED NOT DETECTED Final   Parainfluenza Virus 3  NOT DETECTED NOT DETECTED Final   Parainfluenza Virus 4 NOT DETECTED NOT DETECTED Final   Respiratory Syncytial Virus NOT DETECTED NOT DETECTED Final   Bordetella pertussis NOT DETECTED NOT DETECTED Final   Bordetella Parapertussis NOT DETECTED NOT DETECTED Final   Chlamydophila pneumoniae NOT DETECTED NOT DETECTED Final   Mycoplasma pneumoniae NOT DETECTED NOT DETECTED Final    Comment: Performed at [facility], [place] [address], [place], [place] [address]    Procedures and diagnostic studies:  No results found.             LOS: 3 days   Lindsey Demonte  [facility]   Pager on [email or web address]. If 7PM-7AM, please contact night-coverage at [email or web address]     09/11/2022, 2:52 PM

## 2022-09-11 NOTE — Plan of Care (Signed)

## 2022-09-12 DIAGNOSIS — I5033 Acute on chronic diastolic (congestive) heart failure: Secondary | ICD-10-CM | POA: Diagnosis not present

## 2022-09-12 LAB — CULTURE, BLOOD (ROUTINE X 2): Culture: NO GROWTH

## 2022-09-12 NOTE — TOC Progression Note (Addendum)
Transition of Care [facility]) - Progression Note    Patient Details  Name: Louis Sanchez MRN: [id number] Date of Birth: 1940-10-16  Transition of Care [facility]) CM/SW [place], [place] Phone Number: 09/12/2022, 9:28 AM  Clinical Narrative:      Update 2: Son called back to report that he believes he can get patient on board with SNF, preferences for [facility] and [facility] (patient has hx of [facility] [employer], family hx of [facility]). He would like to see if patient qualifies for CIR first as patient has been to [facility] in the past after a stroke.CSW has reached out to PT to inquire.    Update: Sarah from home [facility] reports assessment was completed, she feels patient needs snf and discussed this with patient's son at bedside. Son not at bedside when this CSW attempted to see them, CSW informed Judson Roch that patient is oriented nd able to make his decisions at this time he is refusing snf as he wants to return to home [facility] with his wife. CSW called patient's son pending call back to discuss discharge planning.    CSW spoke with Judson Roch with [facility] who reports she will come this morning to assess patient to see if he is abel to return to their facility at discharge, as patient is currently refusing snf. Pending assessment.  Expected Discharge Plan: Assisted Living Barriers to Discharge: Continued Medical Work up  Expected Discharge Plan and Services       Living arrangements for the past 2 months: [place] Expected Discharge Date: 09/09/22                                     Social Determinants of Health (SDOH) Interventions SDOH Screenings   Food Insecurity: No Food Insecurity (09/08/2022)  Housing: Low Risk  (09/08/2022)  Transportation Needs: No Transportation Needs (09/08/2022)  Utilities: Not At Risk (09/08/2022)  Tobacco Use: Medium Risk (09/08/2022)    Readmission Risk Interventions     No data to display

## 2022-09-12 NOTE — Care Management Important Message (Signed)
Important Message  Patient Details  Name: Louis Sanchez MRN: [id number] Date of Birth: [birth year]   Medicare Important Message Given:  Yes     Dannette Barbara 09/12/2022, 10:17 AM

## 2022-09-12 NOTE — Progress Notes (Signed)
Progress Note    Louis Sanchez  JOI:[id number] DOB: 01-27-1941  DOA: 09/07/2022 PCP: Adin Hector, MD      Brief Narrative:    Medical records reviewed and are as summarized below:  Louis Sanchez is a 82 y.o. 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       Assessment/Plan:   Principal Problem:   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 heart failure) (HCC) Active Problems:   Hemoptysis   Hypotension   Chronic atrial fibrillation (HCC)   Hypokalemia   Cirrhosis (HCC)   Acute on chronic respiratory failure with hypoxia (HCC)    Body mass index is 22.84 kg/m.   Hemoptysis: Resolved.  No further workup from pulmonologist.   Influenza, RSV and COVID tests were negative   Chronic hypotension: Continue midodrine   Acute on chronic hypoxic respiratory failure: He was previously on oxygen via heated humidified HFNC.  He is tolerating 2 L/min oxygen via [place] which is his baseline   Hypokalemia: Improved.  Continue potassium repletion.   Chronic right pleural effusion: Chest imaging reviewed by interventional radiologist.  Right effusion is felt to be chronic and not amenable to thoracentesis.   Liver cirrhosis: Continue Aldactone and Lasix   Acute on chronic diastolic CHF: Improved.  Continue Aldactone and Lasix..  Infiltrates on CT chest likely due to edema.   2D echo in September 2023 showed EF estimated at 60 to 65%, normal LV diastolic parameters, moderate TR, moderate MR, mild to moderate TR   Permanent atrial fibrillation: Continue Xarelto     Diet Order             Diet - low sodium heart healthy           Diet 2 gram sodium Room service appropriate? Yes; Fluid consistency: Thin  Diet effective now                             Consultants: Pulmonologist  Procedures: None    Medications:    ferrous sulfate  325 mg Oral QHS   furosemide  40 mg Oral BID   gabapentin  300 mg Oral TID   levothyroxine  175 mcg Oral Once per day on Mon Tue Wed Thu Fri Sat   metoprolol succinate  12.5 mg Oral QHS   midodrine  5 mg Oral TID WC   Rivaroxaban  15 mg Oral Q supper   rosuvastatin  10 mg Oral QHS   sertraline  150 mg Oral Daily   spironolactone  25 mg Oral Daily   tiotropium  18 mcg Inhalation Daily   Continuous Infusions:     Anti-infectives (From admission, onward)    Start     Dose/Rate Route Frequency Ordered Stop   09/07/22 1800  cefTRIAXone (ROCEPHIN) 1 g in sodium chloride 0.9 % 100 mL IVPB  Status:  Discontinued        1 g 200 mL/hr over 30 Minutes Intravenous Every 24 hours 09/07/22 1709 09/08/22 1456   09/07/22 1800  azithromycin (ZITHROMAX) 500 mg in sodium chloride 0.9 % 250 mL IVPB  Status:  Discontinued        500 mg 250 mL/hr over 60 Minutes Intravenous Every 24 hours 09/07/22 1709 09/08/22 1456   09/07/22 1245  piperacillin-tazobactam (ZOSYN) IVPB 3.375 g  Status:  Discontinued        3.375 g 100 mL/hr over 30 Minutes Intravenous  Once 09/07/22 1231 09/07/22 1301              Family Communication/Anticipated D/C date and plan/Code Status   DVT prophylaxis:  Rivaroxaban (XARELTO) tablet 15 mg     Code Status: DNR   Family Communication: None Disposition Plan: Plan to discharge to ALF on 09/12/2022   Status is: Inpatient Remains inpatient appropriate because: Awaiting placement         Subjective:   Interval events noted.  No shortness of breath or chest pain.  Objective:    Vitals:   09/12/22 0811 09/12/22 0819 09/12/22 0842 09/12/22 1352  BP: (!) 87/54 99/64  100/61  Pulse: 84 86  65  Resp:      Temp: 98.9 F (37.2 C)   98 F (36.7 C)  TempSrc: Oral   Oral  SpO2: (!) 85% 97%  100%  Weight:   64.2 kg    No data  found.   Intake/Output Summary (Last 24 hours) at 09/12/2022 1401 Last data filed at 09/12/2022 0900 Gross per 24 hour  Intake 530 ml  Output 2725 ml  Net -2195 ml   Filed Weights   09/10/22 0929 09/11/22 0941 09/12/22 0842  Weight: 65.9 kg 62.1 kg 64.2 kg    Exam:   GEN: NAD SKIN: Warm and dry EYES: EOMI ENT: MMM CV: RRR PULM: CTA B ABD: soft, ND, NT, +BS CNS: AAO x 3, non focal EXT: No edema or tenderness     Pressure Injury 08/30/22 Coccyx Mid Deep Tissue Pressure Injury - Purple or maroon localized area of discolored intact skin or blood-filled blister due to damage of underlying soft tissue from pressure and/or shear. intact purple wound (Active)  08/30/22 1635  Location: Coccyx  Location Orientation: Mid  Staging: Deep Tissue Pressure Injury - Purple or maroon localized area of discolored intact skin or blood-filled blister due to damage of underlying soft tissue from pressure and/or shear.  Wound Description (Comments): intact purple wound  Present on Admission: Yes  Dressing Type Foam - Lift dressing to assess site every shift 09/12/22 0800     Data Reviewed:   I have personally reviewed following labs and imaging studies:  Labs: Labs show the following:   Basic Metabolic Panel: Recent Labs  Lab 09/07/22 0932 09/08/22 0440 09/08/22 0727 09/09/22 0515 09/09/22 1322 09/11/22 0456  NA 136 137  --  135  --  138  K 3.6 2.8*  --  3.0* 3.4* 3.6  CL 93* 95*  --  91*  --  92*  CO2 33* 31  --  35*  --  42*  GLUCOSE 111* 94  --  114*  --  108*  BUN 28* 24*  --  21  --  17  CREATININE 1.30* 1.20  --  1.14  --  1.06  CALCIUM 8.1* 7.6*  --  7.9*  --  7.9*  MG  --   --  2.1  --   --  2.2  PHOS  --   --  3.2  --   --   --    GFR Estimated Creatinine Clearance: 50.2 mL/min (by C-G formula based on SCr of 1.06 mg/dL). Liver Function Tests: No results for input(s): "AST", "ALT", "ALKPHOS", "BILITOT", "PROT", "ALBUMIN" in the last 168 hours. No results for  input(s): "LIPASE", "AMYLASE" in the last  168 hours. No results for input(s): "AMMONIA" in the last 168 hours. Coagulation profile Recent Labs  Lab 09/07/22 0932 09/08/22 0440  INR 3.4* 2.1*    CBC: Recent Labs  Lab 09/07/22 0932 09/07/22 1221 09/08/22 0440 09/09/22 0515  WBC 10.1 8.3 6.3 6.6  NEUTROABS  --   --   --  5.4  HGB 10.9* 10.3* 9.7* 10.6*  HCT 35.7* 33.7* 32.5* 35.3*  MCV 92.5 92.6 93.9 93.4  PLT 164 149* 136* 153   Cardiac Enzymes: No results for input(s): "CKTOTAL", "CKMB", "CKMBINDEX", "TROPONINI" in the last 168 hours. BNP (last 3 results) No results for input(s): "PROBNP" in the last 8760 hours. CBG: Recent Labs  Lab 09/08/22 2006  GLUCAP 149*   D-Dimer: No results for input(s): "DDIMER" in the last 72 hours. Hgb A1c: No results for input(s): "HGBA1C" in the last 72 hours. Lipid Profile: No results for input(s): "CHOL", "HDL", "LDLCALC", "TRIG", "CHOLHDL", "LDLDIRECT" in the last 72 hours. Thyroid function studies: No results for input(s): "TSH", "T4TOTAL", "T3FREE", "THYROIDAB" in the last 72 hours.  Invalid input(s): "FREET3" Anemia work up: No results for input(s): "VITAMINB12", "FOLATE", "FERRITIN", "TIBC", "IRON", "RETICCTPCT" in the last 72 hours. Sepsis Labs: Recent Labs  Lab 09/07/22 0932 09/07/22 1221 09/07/22 1240 09/08/22 0440 09/09/22 0515  WBC 10.1 8.3  --  6.3 6.6  LATICACIDVEN  --   --  1.5  --   --     Microbiology Recent Results (from the past 240 hour(s))  Blood culture (routine x 2)     Status: None   Collection Time: 09/07/22 12:40 PM   Specimen: BLOOD  Result Value Ref Range Status   Specimen Description BLOOD RIGHT ARM  Final   Special Requests   Final    BOTTLES DRAWN AEROBIC AND ANAEROBIC Blood Culture results may not be optimal due to an excessive volume of blood received in culture bottles   Culture   Final    NO GROWTH 5 DAYS Performed at [facility], [address] [place], [place] [address]     Report Status 09/12/2022 FINAL  Final  Blood culture (routine x 2)     Status: None (Preliminary result)   Collection Time: 09/07/22  3:03 PM   Specimen: BLOOD  Result Value Ref Range Status   Specimen Description BLOOD LEFT ANTECUBITAL  Final   Special Requests   Final    BOTTLES DRAWN AEROBIC AND ANAEROBIC Blood Culture adequate volume   Culture   Final    NO GROWTH 4 DAYS Performed at [facility], [address] [place], [place] [address]    Report Status PENDING  Incomplete  MRSA Next Gen by PCR, Nasal     Status: None   Collection Time: 09/08/22  9:10 PM   Specimen: Anterior Nasal Swab  Result Value Ref Range Status   MRSA by PCR Next Gen NOT DETECTED NOT DETECTED Final    Comment: (NOTE) The GeneXpert MRSA Assay (FDA approved for NASAL specimens only), is one component of a comprehensive MRSA colonization surveillance program. It is not intended to diagnose MRSA infection nor to guide or monitor treatment for MRSA infections. Test performance is not FDA approved in patients less than 81 years old. Performed at [facility], [address] [place], [place] [address]   Resp panel by RT-PCR (RSV, Flu A&B, Covid) Anterior Nasal Swab     Status: None   Collection Time: 09/09/22  9:40 AM   Specimen: Anterior Nasal Swab  Result Value Ref Range Status   SARS Coronavirus 2 by RT PCR NEGATIVE NEGATIVE Final    Comment: (NOTE) SARS-CoV-2 target nucleic acids are NOT DETECTED.  The SARS-CoV-2 RNA is generally detectable in upper respiratory specimens during the acute phase of infection. The lowest concentration of SARS-CoV-2 viral copies this assay can detect is 138 copies/mL. A negative result does not preclude SARS-Cov-2 infection and should not be used as the sole basis for treatment or other patient management decisions. A negative result may occur with  improper specimen collection/handling, submission of specimen other than nasopharyngeal swab,  presence of viral mutation(s) within the areas targeted by this assay, and inadequate number of viral copies(<138 copies/mL). A negative result must be combined with clinical observations, patient history, and epidemiological information. The expected result is Negative.  Fact Sheet for Patients:  EntrepreneurPulse.com.au  Fact Sheet for Healthcare Providers:  IncredibleEmployment.be  This test is no t yet approved or cleared by the [place] FDA and  has been authorized for detection and/or diagnosis of SARS-CoV-2 by FDA under an Emergency Use Authorization (EUA). This EUA will remain  in effect (meaning this test can be used) for the duration of the COVID-19 declaration under Section 564(b)(1) of the Act, 21 U.S.C.section 360bbb-3(b)(1), unless the authorization is terminated  or revoked sooner.       Influenza A by PCR NEGATIVE NEGATIVE Final   Influenza B by PCR NEGATIVE NEGATIVE Final    Comment: (NOTE) The Xpert Xpress SARS-CoV-2/FLU/RSV plus assay is intended as an aid in the diagnosis of influenza from Nasopharyngeal swab specimens and should not be used as a sole basis for treatment. Nasal washings and aspirates are unacceptable for Xpert Xpress SARS-CoV-2/FLU/RSV testing.  Fact Sheet for Patients: EntrepreneurPulse.com.au  Fact Sheet for Healthcare Providers: IncredibleEmployment.be  This test is not yet approved or cleared by the [place] FDA and has been authorized for detection and/or diagnosis of SARS-CoV-2 by FDA under an Emergency Use Authorization (EUA). This EUA will remain in effect (meaning this test can be used) for the duration of the COVID-19 declaration under Section 564(b)(1) of the Act, 21 U.S.C. section 360bbb-3(b)(1), unless the authorization is terminated or revoked.     Resp Syncytial Virus by PCR NEGATIVE NEGATIVE Final    Comment: (NOTE) Fact Sheet for  Patients: EntrepreneurPulse.com.au  Fact Sheet for Healthcare Providers: IncredibleEmployment.be  This test is not yet approved or cleared by the [place] FDA and has been authorized for detection and/or diagnosis of SARS-CoV-2 by FDA under an Emergency Use Authorization (EUA). This EUA will remain in effect (meaning this test can be used) for the duration of the COVID-19 declaration under Section 564(b)(1) of the Act, 21 U.S.C. section 360bbb-3(b)(1), unless the authorization is terminated or revoked.  Performed at [facility], [place], [place] [address]   Respiratory (~20 pathogens) panel by PCR     Status: None   Collection Time: 09/09/22  9:40 AM   Specimen: Anterior Nasal Swab; Respiratory  Result Value Ref Range Status   Adenovirus NOT DETECTED NOT DETECTED Final   Coronavirus 229E NOT DETECTED NOT DETECTED Final    Comment: (NOTE) The Coronavirus on the Respiratory Panel, DOES NOT test for the novel  Coronavirus (2019 nCoV)    Coronavirus HKU1 NOT DETECTED NOT DETECTED Final   Coronavirus NL63 NOT DETECTED NOT DETECTED Final   Coronavirus OC43 NOT DETECTED NOT DETECTED Final   Metapneumovirus NOT DETECTED NOT DETECTED Final   Rhinovirus / Enterovirus  NOT DETECTED NOT DETECTED Final   Influenza A NOT DETECTED NOT DETECTED Final   Influenza B NOT DETECTED NOT DETECTED Final   Parainfluenza Virus 1 NOT DETECTED NOT DETECTED Final   Parainfluenza Virus 2 NOT DETECTED NOT DETECTED Final   Parainfluenza Virus 3 NOT DETECTED NOT DETECTED Final   Parainfluenza Virus 4 NOT DETECTED NOT DETECTED Final   Respiratory Syncytial Virus NOT DETECTED NOT DETECTED Final   Bordetella pertussis NOT DETECTED NOT DETECTED Final   Bordetella Parapertussis NOT DETECTED NOT DETECTED Final   Chlamydophila pneumoniae NOT DETECTED NOT DETECTED Final   Mycoplasma pneumoniae NOT DETECTED NOT DETECTED Final    Comment: Performed at  [facility], [place] [address] [place], [place] [address]    Procedures and diagnostic studies:  No results found.             LOS: 4 days   Cathy Crounse  [facility]   Pager on [email or web address]. If 7PM-7AM, please contact night-coverage at [email or web address]     09/12/2022, 2:01 PM

## 2022-09-12 NOTE — NC FL2 (Signed)
[place] LEVEL OF CARE FORM     IDENTIFICATION  Patient Name: Louis Sanchez Birthdate: [birth year] Sex: male Admission Date (Current Location): 09/07/2022  [facility] and [place] Number:      Facility and Address:  [facility], [address] [place], [place] [address]      Provider Number: [id number]  Attending Physician Name and Address:  Jennye Boroughs, MD  Relative Name and Phone Number:  Clotilde Dieter [phone]    Current Level of Care: Hospital Recommended Level of Care: [place] Prior Approval Number:    Date Approved/Denied:   PASRR Number: [id number]  Discharge Plan: SNF    Current Diagnoses: Patient Active Problem List   Diagnosis Date Noted   Hemoptysis 09/07/2022   Hypotension 09/07/2022   Cirrhosis ([place]) 09/07/2022   Acute on chronic respiratory failure with hypoxia ([place]) 09/07/2022   Severe sepsis ([place]) 08/29/2022   Acute respiratory failure with hypoxia ([place]) 08/29/2022   Aspiration pneumonia ([place]) 08/29/2022   AKI (acute kidney injury) ([place]) 08/29/2022   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 heart failure) ([place]) 08/10/2022   Permanent atrial fibrillation ([place]) 08/07/2022   Hypokalemia 08/07/2022   Pleural effusion due to CHF (congestive heart failure) ([place]) 04/11/2022   Iron deficiency anemia 04/11/2022   Anemia of chronic disease 02/18/2022   Essential hypertension 02/18/2022   Hypothyroidism 02/18/2022   Dyslipidemia 02/18/2022   CAP (community acquired pneumonia) 02/18/2022   Wound of foot 01/12/2021   Right ventricular dysfunction 10/27/2020   Acquired thrombophilia ([place]) 10/07/2020   Pleural effusion on right 04/08/2020   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/18/2019   Fusion of spine of cervical region 11/18/2019   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/18/2019   Neck pain 11/18/2019   Personal history of skin cancer 05/21/2019   Chronic  anticoagulation 12/11/2016   Former tobacco use 12/11/2016   Malignant neoplasm of lower lobe of right lung ([place]) 11/28/2016   Aortic atherosclerosis ([place]) 10/07/2016   Hypothyroidism (acquired) 10/07/2016   Obstructive sleep apnea syndrome 10/07/2016   Thrombocytopenia ([place]) 10/07/2016   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[place]) 10/03/2016   Mitral regurgitation 09/20/2016   Acute bronchitis 09/13/2016   Leukocytosis 09/13/2016   Venous stasis dermatitis 09/13/2016   LFT elevation    Alcoholic gastritis    Atrial fibrillation with RVR ([place]) 09/11/2016   Anxiety, mild 05/23/2016   Hematochezia 05/23/2016   History of hypothyroidism 05/23/2016   Chronic atrial fibrillation ([place]) 05/03/2016   Ruptured varicose vein 01/25/2016   Varicose veins of both lower extremities with complications 67/61/9509   Mitral valve insufficiency 08/25/2014   Inguinal hernia 02/18/2013   Actinic keratosis 01/21/2010   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   Benign neoplasm of colon 04/04/2008    Orientation RESPIRATION BLADDER Height & Weight     Self, Time, Situation, Place  O2 (2L nasal cannula) Incontinent, External catheter Weight: 141 lb 8.6 oz (64.2 kg) Height:     BEHAVIORAL SYMPTOMS/MOOD NEUROLOGICAL BOWEL NUTRITION STATUS      Continent    AMBULATORY STATUS COMMUNICATION OF NEEDS Skin   Limited Assist Verbally Other (Comment) (abrasion right back and shoulder, weeping right leg, Pressure Injury Coccyx, open wound ankle, venous stacis ulcer right and left foot)                       Personal Care Assistance Level of Assistance  Bathing, Feeding, Dressing, Total care Bathing Assistance: Limited  assistance Feeding assistance: Independent Dressing Assistance: Limited assistance Total Care Assistance: Limited assistance   Functional Limitations Info  Sight, Hearing, Speech Sight Info: Impaired Hearing Info: Impaired Speech Info: Adequate    SPECIAL CARE FACTORS FREQUENCY  PT  (By licensed PT), OT (By licensed OT)     PT Frequency: min 4x weekly OT Frequency: min 4x weekly            Contractures Contractures Info: Not present    Additional Factors Info  Code Status, Allergies Code Status Info: full Allergies Info: Atorvastatin  Levofloxacin           Current Medications (09/12/2022):  This is the current hospital active medication list Current Facility-Administered Medications  Medication Dose Route Frequency Provider Last Rate Last Admin   acetaminophen (TYLENOL) tablet 650 mg  650 mg Oral Q6H PRN Jose Persia, MD       Or   acetaminophen (TYLENOL) suppository 650 mg  650 mg Rectal Q6H PRN Jose Persia, MD       ferrous sulfate tablet 325 mg  325 mg Oral QHS Jose Persia, MD   325 mg at 09/11/22 2112   furosemide (LASIX) tablet 40 mg  40 mg Oral BID Jennye Boroughs, MD   40 mg at 09/12/22 0815   gabapentin (NEURONTIN) capsule 300 mg  300 mg Oral TID Jose Persia, MD   300 mg at 09/12/22 0815   levothyroxine (SYNTHROID) tablet 175 mcg  175 mcg Oral Once per day on Mon Tue Wed Thu Fri Sat Noralee Space, RPH   175 mcg at 09/12/22 0505   metoprolol succinate (TOPROL-XL) 24 hr tablet 12.5 mg  12.5 mg Oral QHS Jose Persia, MD   12.5 mg at 09/11/22 2112   midodrine (PROAMATINE) tablet 5 mg  5 mg Oral TID WC Jose Persia, MD   5 mg at 09/12/22 1347   ondansetron (ZOFRAN) tablet 4 mg  4 mg Oral Q6H PRN Jose Persia, MD       Or   ondansetron (ZOFRAN) injection 4 mg  4 mg Intravenous Q6H PRN Jose Persia, MD       polyethylene glycol (MIRALAX / GLYCOLAX) packet 17 g  17 g Oral Daily PRN Jose Persia, MD       Rivaroxaban (XARELTO) tablet 15 mg  15 mg Oral Q supper Jose Persia, MD   15 mg at 09/11/22 1749   rosuvastatin (CRESTOR) tablet 10 mg  10 mg Oral QHS Jose Persia, MD   10 mg at 09/11/22 2112   sertraline (ZOLOFT) tablet 150 mg  150 mg Oral Daily Jose Persia, MD   150 mg at 09/12/22 0815   spironolactone  (ALDACTONE) tablet 25 mg  25 mg Oral Daily Jennye Boroughs, MD   25 mg at 09/12/22 1133   tiotropium (SPIRIVA) inhalation capsule ([facility] use ONLY) 18 mcg  18 mcg Inhalation Daily Jose Persia, MD   18 mcg at 09/12/22 0820     Discharge Medications: Please see discharge summary for a list of discharge medications.  Relevant Imaging Results:  Relevant Lab Results:   Additional Information SSN: [id number]  Tiburcio Bash, LCSW

## 2022-09-12 NOTE — Progress Notes (Signed)
Physical Therapy Treatment Patient Details Name: Louis Sanchez MRN: [id number] DOB: 06/21/1941 Today's Date: 09/12/2022   History of Present Illness Louis Sanchez is a 82 y.o. 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     [name] states that over the last 1 to 2 days, he has noticed little specks of bright red blood in his sputum.    PT Comments    Patient was agreeable to PT. He continues to have decreased awareness of need for physical assistance with mobility. Assistance is required for bed mobility and transfers with significant posterior lean while standing. Unable to safely progress walking today due to poor standing balance. Very limited activity tolerance and patient fatigued with minimal activity. He reports he wants to go to rehab close to his wife so that he can visit her. Continue to recommend SNF at this time.    Recommendations for follow up therapy are one component of a multi-disciplinary discharge planning process, led by the attending physician.  Recommendations may be updated based on patient status, additional functional criteria and insurance authorization.  Follow Up Recommendations  Skilled nursing-short term rehab (<3 hours/day) Can patient physically be transported by private vehicle: No   Assistance Recommended at Discharge Frequent or constant Supervision/Assistance  Patient can return home with the following A lot of help with walking and/or transfers;A little help with bathing/dressing/bathroom;Assist for transportation;Help with stairs or ramp for entrance;Assistance with cooking/housework   Equipment Recommendations  None recommended by PT    Recommendations for Other Services       Precautions / Restrictions Precautions Precautions: Fall Restrictions Weight Bearing  Restrictions: No     Mobility  Bed Mobility Overal bed mobility: Needs Assistance Bed Mobility: Supine to Sit     Supine to sit: Mod assist     General bed mobility comments: assistance for trunk support and assistance provided for scooting towards edge of bed in preparation for standing.    Transfers Overall transfer level: Needs assistance Equipment used: 1 person hand held assist, Rolling walker (2 wheels) Transfers: Sit to/from Stand, Bed to chair/wheelchair/BSC Sit to Stand: Max assist   Step pivot transfers: Mod assist       General transfer comment: patient required verbal cues for anterior weight shifting to facilitate standing. unable to stand without physical assistance from therapist, although patient requested to stand without help. decreased awareness of deficits and need for phyhsical assistance    Ambulation/Gait               General Gait Details: unable to safely progress walking due to poor standing balance that required at least moderate assistance to maintain midline.   Stairs             Wheelchair Mobility    Modified Rankin (Stroke Patients Only)       Balance Overall balance assessment: Needs assistance Sitting-balance support: Feet supported Sitting balance-Leahy Scale: Fair   Postural control: Posterior lean Standing balance support: Single extremity supported, Bilateral upper extremity supported Standing balance-Leahy Scale: Poor Standing balance comment: Mod A - Max A required to maintain midline with significant posterior biasis while standing.                            Cognition Arousal/Alertness: Awake/alert Behavior During Therapy: WFL for tasks  assessed/performed Overall Cognitive Status: No family/caregiver present to determine baseline cognitive functioning                                 General Comments: increased time required for following commands. some confusion about recent  events        Exercises      General Comments General comments (skin integrity, edema, etc.): 02 removed briefly as patient reports he only uses it PRN at baseline. Sp02 86% after standing with room air. cues for breathing techniques with Sp02 increasing to 92% quickly after replacing the 02.      Pertinent Vitals/Pain Pain Assessment Pain Assessment: No/denies pain    Home Living                          Prior Function            PT Goals (current goals can now be found in the care plan section) Acute Rehab PT Goals Patient Stated Goal: to return home to Home Place PT Goal Formulation: With patient Time For Goal Achievement: 09/22/22 Potential to Achieve Goals: Good Progress towards PT goals: Progressing toward goals    Frequency    Min 2X/week      PT Plan Current plan remains appropriate    Co-evaluation              AM-PAC PT "6 Clicks" Mobility   Outcome Measure  Help needed turning from your back to your side while in a flat bed without using bedrails?: None Help needed moving from lying on your back to sitting on the side of a flat bed without using bedrails?: A Lot Help needed moving to and from a bed to a chair (including a wheelchair)?: A Lot Help needed standing up from a chair using your arms (e.g., wheelchair or bedside chair)?: A Lot Help needed to walk in hospital room?: Total Help needed climbing 3-5 steps with a railing? : Total 6 Click Score: 12    End of Session Equipment Utilized During Treatment: Oxygen Activity Tolerance: Patient limited by fatigue Patient left: in chair;with call bell/phone within reach;with chair alarm set   PT Visit Diagnosis: Muscle weakness (generalized) (M62.81);Difficulty in walking, not elsewhere classified (R26.2);Unsteadiness on feet (R26.81);History of falling (Z91.81)     Time: 0355-9741 PT Time Calculation (min) (ACUTE ONLY): 20 min  Charges:  $Therapeutic Activity: 8-22 mins                     Minna Merritts, PT, MPT    Percell Locus 09/12/2022, 2:54 PM

## 2022-09-13 ENCOUNTER — Encounter: Payer: Medicare Other | Admitting: Family

## 2022-09-13 DIAGNOSIS — I5033 Acute on chronic diastolic (congestive) heart failure: Secondary | ICD-10-CM | POA: Diagnosis not present

## 2022-09-13 LAB — CULTURE, BLOOD (ROUTINE X 2)
Culture: NO GROWTH
Special Requests: ADEQUATE

## 2022-09-13 MED ORDER — POLYVINYL ALCOHOL 1.4 % OP SOLN
1.0000 [drp] | OPHTHALMIC | Status: DC | PRN
  Administered 2022-09-14 – 2022-09-15 (×2): 1 [drp] via OPHTHALMIC
  Filled 2022-09-13: qty 15

## 2022-09-13 NOTE — Progress Notes (Signed)
Progress Note    Louis Sanchez  TGY:[id number] DOB: [birth year]  DOA: 09/07/2022 PCP: Adin Hector, MD      Brief Narrative:    Medical records reviewed and are as summarized below:  Louis Sanchez is a 82 y.o. 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       Assessment/Plan:   Principal Problem:   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 heart failure) (HCC) Active Problems:   Hemoptysis   Hypotension   Chronic atrial fibrillation (HCC)   Hypokalemia   Cirrhosis (HCC)   Acute on chronic respiratory failure with hypoxia (HCC)    Body mass index is 21.45 kg/m.   Hemoptysis: Resolved.  No further workup from pulmonologist.   Influenza, RSV and COVID tests were negative   Chronic hypotension: Continue midodrine   Acute on chronic hypoxic respiratory failure: He was previously on oxygen via heated humidified HFNC.  He is tolerating 2 L/min oxygen via [place] which is his baseline   Hypokalemia: Improved.  Continue potassium repletion.   Chronic right pleural effusion: Chest imaging reviewed by interventional radiologist.  Right effusion is felt to be chronic and not amenable to thoracentesis.   Liver cirrhosis: Compensated.  Continue Lasix and Aldactone   Acute on chronic diastolic CHF: Improved.  Continue Aldactone and Lasix..  Infiltrates on CT chest likely due to edema.   2D echo in September 2023 showed EF estimated at 60 to 65%, normal LV diastolic parameters, moderate TR, moderate MR, mild to moderate TR   Permanent atrial fibrillation: Continue Xarelto     Diet Order             Diet - low sodium heart healthy           Diet 2 gram sodium Room service appropriate? Yes; Fluid consistency: Thin  Diet effective now                             Consultants: Pulmonologist  Procedures: None    Medications:    ferrous sulfate  325 mg Oral QHS   furosemide  40 mg Oral BID   gabapentin  300 mg Oral TID   levothyroxine  175 mcg Oral Once per day on Mon Tue Wed Thu Fri Sat   metoprolol succinate  12.5 mg Oral QHS   midodrine  5 mg Oral TID WC   Rivaroxaban  15 mg Oral Q supper   rosuvastatin  10 mg Oral QHS   sertraline  150 mg Oral Daily   spironolactone  25 mg Oral Daily   tiotropium  18 mcg Inhalation Daily   Continuous Infusions:     Anti-infectives (From admission, onward)    Start     Dose/Rate Route Frequency Ordered Stop   09/07/22 1800  cefTRIAXone (ROCEPHIN) 1 g in sodium chloride 0.9 % 100 mL IVPB  Status:  Discontinued        1 g 200 mL/hr over 30 Minutes Intravenous Every 24 hours 09/07/22 1709 09/08/22 1456   09/07/22 1800  azithromycin (ZITHROMAX) 500 mg in sodium chloride 0.9 % 250 mL IVPB  Status:  Discontinued        500 mg 250 mL/hr over 60 Minutes Intravenous Every 24 hours 09/07/22 1709 09/08/22 1456  09/07/22 1245  piperacillin-tazobactam (ZOSYN) IVPB 3.375 g  Status:  Discontinued        3.375 g 100 mL/hr over 30 Minutes Intravenous  Once 09/07/22 1231 09/07/22 1301              Family Communication/Anticipated D/C date and plan/Code Status   DVT prophylaxis:  Rivaroxaban (XARELTO) tablet 15 mg     Code Status: DNR   Family Communication: None Disposition Plan: Plan to discharge to ALF on 09/12/2022   Status is: Inpatient Remains inpatient appropriate because: Awaiting placement         Subjective:   He has no complaints.  He feels well and ready for discharge.  No shortness of breath, cough or chest pain  Objective:    Vitals:   09/12/22 2325 09/13/22 0341 09/13/22 0830 09/13/22 1431  BP: 96/64 (!) 89/60 (!) 88/64 (!) 99/57  Pulse: 62 63 62 64  Resp: 18 20 18 18   Temp: 98.9 F (37.2 C) 98 F (36.7 C) 97.7 F (36.5 C) 97.6 F (36.4  C)  TempSrc:   Oral Oral  SpO2: 100% 100%  99%  Weight:   60.3 kg    No data found.   Intake/Output Summary (Last 24 hours) at 09/13/2022 1543 Last data filed at 09/13/2022 1438 Gross per 24 hour  Intake 628 ml  Output 1950 ml  Net -1322 ml   Filed Weights   09/11/22 0941 09/12/22 0842 09/13/22 0830  Weight: 62.1 kg 64.2 kg 60.3 kg    Exam:   GEN: NAD SKIN: Warm and dry EYES: EOMI ENT: MMM CV: RRR PULM: CTA B ABD: soft, ND, NT, +BS CNS: AAO x 3, non focal EXT: No edema or tenderness    Pressure Injury 08/30/22 Coccyx Mid Deep Tissue Pressure Injury - Purple or maroon localized area of discolored intact skin or blood-filled blister due to damage of underlying soft tissue from pressure and/or shear. intact purple wound (Active)  08/30/22 1635  Location: Coccyx  Location Orientation: Mid  Staging: Deep Tissue Pressure Injury - Purple or maroon localized area of discolored intact skin or blood-filled blister due to damage of underlying soft tissue from pressure and/or shear.  Wound Description (Comments): intact purple wound  Present on Admission: Yes  Dressing Type Foam - Lift dressing to assess site every shift 09/13/22 0800     Data Reviewed:   I have personally reviewed following labs and imaging studies:  Labs: Labs show the following:   Basic Metabolic Panel: Recent Labs  Lab 09/07/22 0932 09/08/22 0440 09/08/22 0727 09/09/22 0515 09/09/22 1322 09/11/22 0456  NA 136 137  --  135  --  138  K 3.6 2.8*  --  3.0* 3.4* 3.6  CL 93* 95*  --  91*  --  92*  CO2 33* 31  --  35*  --  42*  GLUCOSE 111* 94  --  114*  --  108*  BUN 28* 24*  --  21  --  17  CREATININE 1.30* 1.20  --  1.14  --  1.06  CALCIUM 8.1* 7.6*  --  7.9*  --  7.9*  MG  --   --  2.1  --   --  2.2  PHOS  --   --  3.2  --   --   --    GFR Estimated Creatinine Clearance: 47.4 mL/min (by C-G formula based on SCr of 1.06 mg/dL). Liver Function Tests: No results for input(s): "AST", "ALT",  "  ALKPHOS", "BILITOT", "PROT", "ALBUMIN" in the last 168 hours. No results for input(s): "LIPASE", "AMYLASE" in the last 168 hours. No results for input(s): "AMMONIA" in the last 168 hours. Coagulation profile Recent Labs  Lab 09/07/22 0932 09/08/22 0440  INR 3.4* 2.1*    CBC: Recent Labs  Lab 09/07/22 0932 09/07/22 1221 09/08/22 0440 09/09/22 0515  WBC 10.1 8.3 6.3 6.6  NEUTROABS  --   --   --  5.4  HGB 10.9* 10.3* 9.7* 10.6*  HCT 35.7* 33.7* 32.5* 35.3*  MCV 92.5 92.6 93.9 93.4  PLT 164 149* 136* 153   Cardiac Enzymes: No results for input(s): "CKTOTAL", "CKMB", "CKMBINDEX", "TROPONINI" in the last 168 hours. BNP (last 3 results) No results for input(s): "PROBNP" in the last 8760 hours. CBG: Recent Labs  Lab 09/08/22 2006  GLUCAP 149*   D-Dimer: No results for input(s): "DDIMER" in the last 72 hours. Hgb A1c: No results for input(s): "HGBA1C" in the last 72 hours. Lipid Profile: No results for input(s): "CHOL", "HDL", "LDLCALC", "TRIG", "CHOLHDL", "LDLDIRECT" in the last 72 hours. Thyroid function studies: No results for input(s): "TSH", "T4TOTAL", "T3FREE", "THYROIDAB" in the last 72 hours.  Invalid input(s): "FREET3" Anemia work up: No results for input(s): "VITAMINB12", "FOLATE", "FERRITIN", "TIBC", "IRON", "RETICCTPCT" in the last 72 hours. Sepsis Labs: Recent Labs  Lab 09/07/22 0932 09/07/22 1221 09/07/22 1240 09/08/22 0440 09/09/22 0515  WBC 10.1 8.3  --  6.3 6.6  LATICACIDVEN  --   --  1.5  --   --     Microbiology Recent Results (from the past 240 hour(s))  Blood culture (routine x 2)     Status: None   Collection Time: 09/07/22 12:40 PM   Specimen: BLOOD  Result Value Ref Range Status   Specimen Description BLOOD RIGHT ARM  Final   Special Requests   Final    BOTTLES DRAWN AEROBIC AND ANAEROBIC Blood Culture results may not be optimal due to an excessive volume of blood received in culture bottles   Culture   Final    NO GROWTH 5  DAYS Performed at [facility], [address] [place], [place] [address]    Report Status 09/12/2022 FINAL  Final  Blood culture (routine x 2)     Status: None   Collection Time: 09/07/22  3:03 PM   Specimen: BLOOD  Result Value Ref Range Status   Specimen Description BLOOD LEFT ANTECUBITAL  Final   Special Requests   Final    BOTTLES DRAWN AEROBIC AND ANAEROBIC Blood Culture adequate volume   Culture   Final    NO GROWTH 5 DAYS Performed at [facility], [address] [place], [place] [address]    Report Status 09/13/2022 FINAL  Final  MRSA Next Gen by PCR, Nasal     Status: None   Collection Time: 09/08/22  9:10 PM   Specimen: Anterior Nasal Swab  Result Value Ref Range Status   MRSA by PCR Next Gen NOT DETECTED NOT DETECTED Final    Comment: (NOTE) The GeneXpert MRSA Assay (FDA approved for NASAL specimens only), is one component of a comprehensive MRSA colonization surveillance program. It is not intended to diagnose MRSA infection nor to guide or monitor treatment for MRSA infections. Test performance is not FDA approved in patients less than 62 years old. Performed at [facility], [address] [place], [place] [address]   Resp panel by RT-PCR (RSV, Flu A&B, Covid) Anterior Nasal Swab  Status: None   Collection Time: 09/09/22  9:40 AM   Specimen: Anterior Nasal Swab  Result Value Ref Range Status   SARS Coronavirus 2 by RT PCR NEGATIVE NEGATIVE Final    Comment: (NOTE) SARS-CoV-2 target nucleic acids are NOT DETECTED.  The SARS-CoV-2 RNA is generally detectable in upper respiratory specimens during the acute phase of infection. The lowest concentration of SARS-CoV-2 viral copies this assay can detect is 138 copies/mL. A negative result does not preclude SARS-Cov-2 infection and should not be used as the sole basis for treatment or other patient management decisions. A negative result may occur with  improper specimen  collection/handling, submission of specimen other than nasopharyngeal swab, presence of viral mutation(s) within the areas targeted by this assay, and inadequate number of viral copies(<138 copies/mL). A negative result must be combined with clinical observations, patient history, and epidemiological information. The expected result is Negative.  Fact Sheet for Patients:  EntrepreneurPulse.com.au  Fact Sheet for Healthcare Providers:  IncredibleEmployment.be  This test is no t yet approved or cleared by the [place] FDA and  has been authorized for detection and/or diagnosis of SARS-CoV-2 by FDA under an Emergency Use Authorization (EUA). This EUA will remain  in effect (meaning this test can be used) for the duration of the COVID-19 declaration under Section 564(b)(1) of the Act, 21 U.S.C.section 360bbb-3(b)(1), unless the authorization is terminated  or revoked sooner.       Influenza A by PCR NEGATIVE NEGATIVE Final   Influenza B by PCR NEGATIVE NEGATIVE Final    Comment: (NOTE) The Xpert Xpress SARS-CoV-2/FLU/RSV plus assay is intended as an aid in the diagnosis of influenza from Nasopharyngeal swab specimens and should not be used as a sole basis for treatment. Nasal washings and aspirates are unacceptable for Xpert Xpress SARS-CoV-2/FLU/RSV testing.  Fact Sheet for Patients: EntrepreneurPulse.com.au  Fact Sheet for Healthcare Providers: IncredibleEmployment.be  This test is not yet approved or cleared by the [place] FDA and has been authorized for detection and/or diagnosis of SARS-CoV-2 by FDA under an Emergency Use Authorization (EUA). This EUA will remain in effect (meaning this test can be used) for the duration of the COVID-19 declaration under Section 564(b)(1) of the Act, 21 U.S.C. section 360bbb-3(b)(1), unless the authorization is terminated or revoked.     Resp Syncytial  Virus by PCR NEGATIVE NEGATIVE Final    Comment: (NOTE) Fact Sheet for Patients: EntrepreneurPulse.com.au  Fact Sheet for Healthcare Providers: IncredibleEmployment.be  This test is not yet approved or cleared by the [place] FDA and has been authorized for detection and/or diagnosis of SARS-CoV-2 by FDA under an Emergency Use Authorization (EUA). This EUA will remain in effect (meaning this test can be used) for the duration of the COVID-19 declaration under Section 564(b)(1) of the Act, 21 U.S.C. section 360bbb-3(b)(1), unless the authorization is terminated or revoked.  Performed at [facility], [place], [place] [address]   Respiratory (~20 pathogens) panel by PCR     Status: None   Collection Time: 09/09/22  9:40 AM   Specimen: Anterior Nasal Swab; Respiratory  Result Value Ref Range Status   Adenovirus NOT DETECTED NOT DETECTED Final   Coronavirus 229E NOT DETECTED NOT DETECTED Final    Comment: (NOTE) The Coronavirus on the Respiratory Panel, DOES NOT test for the novel  Coronavirus (2019 nCoV)    Coronavirus HKU1 NOT DETECTED NOT DETECTED Final   Coronavirus NL63 NOT DETECTED NOT DETECTED Final   Coronavirus OC43 NOT  DETECTED NOT DETECTED Final   Metapneumovirus NOT DETECTED NOT DETECTED Final   Rhinovirus / Enterovirus NOT DETECTED NOT DETECTED Final   Influenza A NOT DETECTED NOT DETECTED Final   Influenza B NOT DETECTED NOT DETECTED Final   Parainfluenza Virus 1 NOT DETECTED NOT DETECTED Final   Parainfluenza Virus 2 NOT DETECTED NOT DETECTED Final   Parainfluenza Virus 3 NOT DETECTED NOT DETECTED Final   Parainfluenza Virus 4 NOT DETECTED NOT DETECTED Final   Respiratory Syncytial Virus NOT DETECTED NOT DETECTED Final   Bordetella pertussis NOT DETECTED NOT DETECTED Final   Bordetella Parapertussis NOT DETECTED NOT DETECTED Final   Chlamydophila pneumoniae NOT DETECTED NOT DETECTED Final    Mycoplasma pneumoniae NOT DETECTED NOT DETECTED Final    Comment: Performed at [facility], [place] [address], [place], [place] [address]    Procedures and diagnostic studies:  No results found.             LOS: 5 days   Cumi Sanagustin  [facility]   Pager on [email or web address]. If 7PM-7AM, please contact night-coverage at [email or web address]     09/13/2022, 3:43 PM

## 2022-09-14 DIAGNOSIS — I5033 Acute on chronic diastolic (congestive) heart failure: Secondary | ICD-10-CM | POA: Diagnosis not present

## 2022-09-14 LAB — POTASSIUM: Potassium: 3.4 mmol/L — ABNORMAL LOW (ref 3.5–5.1)

## 2022-09-14 MED ORDER — POTASSIUM CHLORIDE CRYS ER 20 MEQ PO TBCR
40.0000 meq | EXTENDED_RELEASE_TABLET | ORAL | Status: AC
  Administered 2022-09-14 (×2): 40 meq via ORAL
  Filled 2022-09-14 (×2): qty 2

## 2022-09-14 MED ORDER — SALINE SPRAY 0.65 % NA SOLN
1.0000 | NASAL | Status: DC | PRN
  Filled 2022-09-14: qty 44

## 2022-09-14 NOTE — Progress Notes (Signed)
Progress Note    Louis Sanchez  LNL:[id number] DOB: 1941/06/12  DOA: 09/07/2022 PCP: Adin Hector, MD      Brief Narrative:    Medical records reviewed and are as summarized below:  Louis Sanchez is a 82 y.o. 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       Assessment/Plan:   Principal Problem:   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 heart failure) (HCC) Active Problems:   Hemoptysis   Hypotension   Chronic atrial fibrillation (HCC)   Hypokalemia   Cirrhosis (HCC)   Acute on chronic respiratory failure with hypoxia (HCC)    Body mass index is 21.45 kg/m.   Hemoptysis: Resolved. Seen by pulmonology.  Attributed to having had nebulized TXA prior to occurrence. No further workup recommended.  Influenza, RSV and COVID tests were negative.  Briefly treated with antibiotics for ?pneumonia which is felt unlikely.  Pt stable off antibiotics and having undergone diuresis.  Hypokalemia - recurrent.  K 3.4 today, replacing Monitor BMP   Chronic hypotension: Continue midodrine   Acute on chronic hypoxic respiratory failure: He was previously on oxygen via heated humidified HFNC.  He is tolerating 2 L/min oxygen via [place] which is his baseline   Hypokalemia: Improved.  Continue potassium repletion.   Chronic right pleural effusion: Chest imaging reviewed by interventional radiologist.  Right effusion is felt to be chronic and not amenable to thoracentesis.   Liver cirrhosis: Compensated.  Continue Lasix and Aldactone   Acute on chronic diastolic CHF: Improved.  Continue Aldactone and Lasix..  Infiltrates on CT chest likely due to edema.   2D echo in September 2023 showed EF estimated at 60 to 65%, normal LV diastolic parameters, moderate TR, moderate MR, mild to moderate  TR   Permanent atrial fibrillation: Continue Xarelto     Diet Order             Diet - low sodium heart healthy           Diet 2 gram sodium Room service appropriate? Yes; Fluid consistency: Thin  Diet effective now                   Consultants: Pulmonologist  Procedures: None    Medications:    ferrous sulfate  325 mg Oral QHS   furosemide  40 mg Oral BID   gabapentin  300 mg Oral TID   levothyroxine  175 mcg Oral Once per day on Mon Tue Wed Thu Fri Sat   metoprolol succinate  12.5 mg Oral QHS   midodrine  5 mg Oral TID WC   Rivaroxaban  15 mg Oral Q supper   rosuvastatin  10 mg Oral QHS   sertraline  150 mg Oral Daily   spironolactone  25 mg Oral Daily   tiotropium  18 mcg Inhalation Daily   Continuous Infusions:     Anti-infectives (From admission, onward)    Start     Dose/Rate Route Frequency Ordered Stop   09/07/22 1800  cefTRIAXone (ROCEPHIN) 1 g in sodium chloride 0.9 % 100 mL IVPB  Status:  Discontinued        1 g 200 mL/hr over 30 Minutes Intravenous Every 24 hours 09/07/22 1709 09/08/22 1456   09/07/22 1800  azithromycin (ZITHROMAX) 500 mg in sodium chloride 0.9 %  250 mL IVPB  Status:  Discontinued        500 mg 250 mL/hr over 60 Minutes Intravenous Every 24 hours 09/07/22 1709 09/08/22 1456   09/07/22 1245  piperacillin-tazobactam (ZOSYN) IVPB 3.375 g  Status:  Discontinued        3.375 g 100 mL/hr over 30 Minutes Intravenous  Once 09/07/22 1231 09/07/22 1301              Family Communication/Anticipated D/C date and plan/Code Status   DVT prophylaxis:  Rivaroxaban (XARELTO) tablet 15 mg     Code Status: DNR   Family Communication: None Disposition Plan: Plan to discharge to ALF on 09/15/2022   Status is: Inpatient Remains inpatient appropriate because: Patient declines SNF/rehab as recommended.  Anticipate home with [facility] therapy tomorrow     Subjective:   Pt was seen this AM, eating breakfast. Reports feeling well  overall.  Reports increased watery itchy eyes, improved with lubricating eye drops.  No other complaints.  Denies fever/chills, feeling sick or run down.  Reports no further hemoptysis.   Objective:    Vitals:   09/12/22 2325 09/13/22 0341 09/13/22 0830 09/13/22 1431  BP: 96/64 (!) 89/60 (!) 88/64 (!) 99/57  Pulse: 62 63 62 64  Resp: 18 20 18 18   Temp: 98.9 F (37.2 C) 98 F (36.7 C) 97.7 F (36.5 C) 97.6 F (36.4 C)  TempSrc:   Oral Oral  SpO2: 100% 100%  99%  Weight:   60.3 kg    No data found.   Intake/Output Summary (Last 24 hours) at 09/13/2022 1543 Last data filed at 09/13/2022 1438 Gross per 24 hour  Intake 628 ml  Output 1950 ml  Net -1322 ml   Filed Weights   09/11/22 0941 09/12/22 0842 09/13/22 0830  Weight: 62.1 kg 64.2 kg 60.3 kg    Exam:   General exam: awake, alert, no acute distress HEENT: watery eyes with R>L conjunctival injection, moist mucus membranes, hearing grossly normal  Respiratory system: CTAB but diminished bases, no wheezes, normal respiratory effort. Cardiovascular system: normal S1/S2, RRR, resolved lower extremity edema.   Gastrointestinal system: soft, NT, ND Central nervous system: A&O x4. no gross focal neurologic deficits, normal speech Extremities: BLE venous stasis, resolved BLE edema edema, normal tone Skin: dry, intact, normal temperature Psychiatry: normal mood, congruent affect, judgement and insight appear normal     Pressure Injury 08/30/22 Coccyx Mid Deep Tissue Pressure Injury - Purple or maroon localized area of discolored intact skin or blood-filled blister due to damage of underlying soft tissue from pressure and/or shear. intact purple wound (Active)  08/30/22 1635  Location: Coccyx  Location Orientation: Mid  Staging: Deep Tissue Pressure Injury - Purple or maroon localized area of discolored intact skin or blood-filled blister due to damage of underlying soft tissue from pressure and/or shear.  Wound Description  (Comments): intact purple wound  Present on Admission: Yes  Dressing Type Foam - Lift dressing to assess site every shift 09/13/22 0800     Labs & Data Reviewed:   Notable labs ----  K 3.4. No other new labs today.    LOS: 5 days   Louis Sanchez  [facility]   Pager on [email or web address]. If 7PM-7AM, please contact night-coverage at [email or web address]     09/13/2022, 3:43 PM

## 2022-09-14 NOTE — Progress Notes (Addendum)
Physical Therapy Treatment Patient Details Name: Louis Sanchez MRN: [id number] DOB: 20-Mar-1941 Today's Date: 09/14/2022   History of Present Illness Louis Sanchez is a 82 y.o. 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     Louis Sanchez states that over the last 1 to 2 days, he has noticed little specks of bright red blood in his sputum.    PT Comments    Patient is agreeable to PT. He continues to required physical assistance for standing. Posterior lean is present with standing and ambulation with facilitation required for anterior weight shifting. Mod A required for walking a short distance with cues for improved balance and gait pattern. The patient would not be able to discharge home without physical assistance. Continue to recommend SNF for short term rehab with discharged.    Recommendations for follow up therapy are one component of a multi-disciplinary discharge planning process, led by the attending physician.  Recommendations may be updated based on patient status, additional functional criteria and insurance authorization.  Follow Up Recommendations  Skilled nursing-short term rehab (<3 hours/day) Can patient physically be transported by private vehicle: No   Assistance Recommended at Discharge Frequent or constant Supervision/Assistance  Patient can return home with the following A lot of help with walking and/or transfers;A little help with bathing/dressing/bathroom;Assist for transportation;Help with stairs or ramp for entrance;Assistance with cooking/housework   Equipment Recommendations  None recommended by PT    Recommendations for Other Services       Precautions / Restrictions Precautions Precautions: Fall Restrictions Weight Bearing Restrictions: No     Mobility  Bed  Mobility Overal bed mobility: Needs Assistance Bed Mobility: Supine to Sit, Sit to Supine     Supine to sit: Min assist Sit to supine: Mod assist   General bed mobility comments: verbal cues for technique    Transfers Overall transfer level: Needs assistance Equipment used: 1 person hand held assist Transfers: Sit to/from Stand Sit to Stand: Mod assist   Step pivot transfers: Mod assist       General transfer comment: verbal cues for technique and for anterior weight shifting as patient has posterior biasis    Ambulation/Gait Ambulation/Gait assistance: Mod assist Gait Distance (Feet): 20 Feet Assistive device: 1 person hand held assist Gait Pattern/deviations: Step-to pattern, Decreased stride length Gait velocity: decreased     General Gait Details: posterior lean with standing. faciliation for anterior weight shifting with safety cues provided, including to increase step length bilaterally. patient is a high fall risk without physical assistance and has decreased awareness of deficits   Stairs             Wheelchair Mobility    Modified Rankin (Stroke Patients Only)       Balance Overall balance assessment: Needs assistance Sitting-balance support: Feet supported Sitting balance-Leahy Scale: Fair   Postural control: Posterior lean Standing balance support: Single extremity supported, Bilateral upper extremity supported Standing balance-Leahy Scale: Poor Standing balance comment: Mod A - Max A required to maintain midline with significant posterior biasis while standing.                            Cognition Arousal/Alertness: Awake/alert Behavior During Therapy: WFL for tasks assessed/performed Overall Cognitive Status: No family/caregiver present to determine baseline cognitive functioning  Exercises      General Comments        Pertinent Vitals/Pain Pain Assessment Pain  Assessment: No/denies pain    Home Living                          Prior Function            PT Goals (current goals can now be found in the care plan section) Acute Rehab PT Goals Patient Stated Goal: to return home to Home Place PT Goal Formulation: With patient Time For Goal Achievement: 09/22/22 Potential to Achieve Goals: Good Progress towards PT goals: Progressing toward goals    Frequency    Min 2X/week      PT Plan Current plan remains appropriate    Co-evaluation              AM-PAC PT "6 Clicks" Mobility   Outcome Measure  Help needed turning from your back to your side while in a flat bed without using bedrails?: None Help needed moving from lying on your back to sitting on the side of a flat bed without using bedrails?: A Lot Help needed moving to and from a bed to a chair (including a wheelchair)?: A Lot Help needed standing up from a chair using your arms (e.g., wheelchair or bedside chair)?: A Lot Help needed to walk in hospital room?: Total Help needed climbing 3-5 steps with a railing? : Total 6 Click Score: 12    End of Session Equipment Utilized During Treatment: Oxygen Activity Tolerance: Patient limited by fatigue Patient left: in bed;with call bell/phone within reach;with bed alarm set Nurse Communication: Mobility status PT Visit Diagnosis: Muscle weakness (generalized) (M62.81);Difficulty in walking, not elsewhere classified (R26.2);Unsteadiness on feet (R26.81);History of falling (Z91.81)     Time: 6384-6659 PT Time Calculation (min) (ACUTE ONLY): 19 min  Charges:  $Therapeutic Activity: 8-22 mins                     Minna Merritts, PT, MPT    Percell Locus 09/14/2022, 3:01 PM

## 2022-09-15 DIAGNOSIS — I5033 Acute on chronic diastolic (congestive) heart failure: Secondary | ICD-10-CM | POA: Diagnosis not present

## 2022-09-15 DIAGNOSIS — L8990 Pressure ulcer of unspecified site, unspecified stage: Secondary | ICD-10-CM | POA: Diagnosis present

## 2022-09-15 LAB — BASIC METABOLIC PANEL
Anion gap: 7 (ref 5–15)
BUN: 15 mg/dL (ref 8–23)
CO2: 38 mmol/L — ABNORMAL HIGH (ref 22–32)
Calcium: 7.7 mg/dL — ABNORMAL LOW (ref 8.9–10.3)
Chloride: 89 mmol/L — ABNORMAL LOW (ref 98–111)
Creatinine, Ser: 0.92 mg/dL (ref 0.61–1.24)
GFR, Estimated: >60 mL/min (ref >60)
Glucose, Bld: 97 mg/dL (ref 70–99)
Potassium: 3.9 mmol/L (ref 3.5–5.1)
Sodium: 134 mmol/L — ABNORMAL LOW (ref 135–145)

## 2022-09-15 MED ORDER — SALINE SPRAY 0.65 % NA SOLN: 1.0000 | NASAL | 0 refills | Status: AC | PRN

## 2022-09-15 MED ORDER — RIVAROXABAN 20 MG PO TABS
20.0000 mg | ORAL_TABLET | Freq: Every day | ORAL | Status: DC
  Administered 2022-09-15: 20 mg via ORAL
  Filled 2022-09-15: qty 1

## 2022-09-15 MED ORDER — POTASSIUM CHLORIDE CRYS ER 20 MEQ PO TBCR: 20.0000 meq | EXTENDED_RELEASE_TABLET | Freq: Every day | ORAL | 0 refills | Status: AC

## 2022-09-15 MED ORDER — POLYVINYL ALCOHOL 1.4 % OP SOLN: 1.0000 [drp] | OPHTHALMIC | 0 refills | Status: AC | PRN

## 2022-09-15 MED ORDER — POTASSIUM CHLORIDE CRYS ER 20 MEQ PO TBCR
40.0000 meq | EXTENDED_RELEASE_TABLET | Freq: Every day | ORAL | Status: DC
  Administered 2022-09-15 – 2022-09-16 (×2): 40 meq via ORAL
  Filled 2022-09-15 (×2): qty 2

## 2022-09-15 NOTE — Assessment & Plan Note (Signed)
POA.  I agree with the wound descriptions as outlined below. Patient seen by Wound Care RN. Wound care recommended as follows: 1. Cleanse bilateral foot wounds with normal saline, then top with a piece of calcium alginate Kellie Simmering # [phone]) and cover with an ABD pads and Kerlix. 2. Apply barrier cream and antifungal powder to scrotum with each turning and cleaning session 3. Foam dressing to sacrum, right posterior shoulder, and left back.  Change every 3 days or as needed for any soiling  Pressure Injury 08/30/22 Coccyx Mid Deep Tissue Pressure Injury - Purple or maroon localized area of discolored intact skin or blood-filled blister due to damage of underlying soft tissue from pressure and/or shear. intact purple wound (Active)  08/30/22 1635  Location: Coccyx  Location Orientation: Mid  Staging: Deep Tissue Pressure Injury - Purple or maroon localized area of discolored intact skin or blood-filled blister due to damage of underlying soft tissue from pressure and/or shear.  Wound Description (Comments): intact purple wound  Present on Admission: Yes    Home Health RN ordered to follow after discharge and closely monitor.

## 2022-09-15 NOTE — Progress Notes (Signed)
Physical Therapy Treatment Patient Details Name: Louis Sanchez MRN: [id number] DOB: 20-May-1941 Today's Date: 09/15/2022   History of Present Illness Louis Sanchez is a 82 y.o. 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     Louis Sanchez states that over the last 1 to 2 days, he has noticed little specks of bright red blood in his sputum.    PT Comments    The patient was agreeable to PT. He continues to require assistance for bed mobility and transfers. The patient is unable to to stand without physical assistance despite maximal effort. He consistently has a posterior lean with transfers and while standing, that is exacerbated with challenges of head turns and having no UE support. Encouraged patient to have assistance with mobility for fall prevention. Recommend SNF for short term rehab, however patient reports wanting to return to previous living arrangements. PT will continue to follow while in the hospital.    Recommendations for follow up therapy are one component of a multi-disciplinary discharge planning process, led by the attending physician.  Recommendations may be updated based on patient status, additional functional criteria and insurance authorization.  Follow Up Recommendations  Skilled nursing-short term rehab (<3 hours/day) Can patient physically be transported by private vehicle: No   Assistance Recommended at Discharge Frequent or constant Supervision/Assistance  Patient can return home with the following A lot of help with walking and/or transfers;A little help with bathing/dressing/bathroom;Assist for transportation;Help with stairs or ramp for entrance;Assistance with cooking/housework   Equipment Recommendations  None recommended by PT    Recommendations for Other Services        Precautions / Restrictions Precautions Precautions: Fall Restrictions Weight Bearing Restrictions: No     Mobility  Bed Mobility Overal bed mobility: Needs Assistance Bed Mobility: Supine to Sit, Sit to Supine     Supine to sit: Min assist Sit to supine: Min assist   General bed mobility comments: assistance for scooting hips forward in preparation for standing. assistance also for RLE support to return to bed. verbal cues for technique    Transfers Overall transfer level: Needs assistance Equipment used: Rolling walker (2 wheels), None Transfers: Sit to/from Stand Sit to Stand: Mod assist           General transfer comment: Mod A required for standing from the bed with cues for anterior weight shifting, foot placement, hand placement, and technique to facilitate independence with standing. he is unable to stand without physical assistance despite maximal effort and cues for technique    Ambulation/Gait               General Gait Details: focused on standing balance and dynamic standing activity this session. the patient will need assistance of a person to walk at home given significant posterior lean and loss of balance   Stairs             Wheelchair Mobility    Modified Rankin (Stroke Patients Only)       Balance Overall balance assessment: Needs assistance Sitting-balance support: Feet supported Sitting balance-Leahy Scale: Fair   Postural control: Posterior lean Standing balance support: Bilateral upper extremity supported, No upper extremity supported, During functional activity Standing balance-Leahy Scale: Poor Standing balance comment: patient challenged with rolling walker support, without UE support, with head turns. the patient has consistent posterior lean with emphasis  on anterior weight shifting to maintain midline. the patient is unable maintain midline without physical assistance                            Cognition  Arousal/Alertness: Awake/alert Behavior During Therapy: WFL for tasks assessed/performed Overall Cognitive Status: No family/caregiver present to determine baseline cognitive functioning                                          Exercises      General Comments        Pertinent Vitals/Pain Pain Assessment Pain Assessment: No/denies pain    Home Living                          Prior Function            PT Goals (current goals can now be found in the care plan section) Acute Rehab PT Goals Patient Stated Goal: to return home to Home Place PT Goal Formulation: With patient Time For Goal Achievement: 09/22/22 Potential to Achieve Goals: Good Progress towards PT goals: Progressing toward goals    Frequency    Min 2X/week      PT Plan Current plan remains appropriate    Co-evaluation              AM-PAC PT "6 Clicks" Mobility   Outcome Measure  Help needed turning from your back to your side while in a flat bed without using bedrails?: None Help needed moving from lying on your back to sitting on the side of a flat bed without using bedrails?: A Lot Help needed moving to and from a bed to a chair (including a wheelchair)?: A Lot Help needed standing up from a chair using your arms (e.g., wheelchair or bedside chair)?: A Lot Help needed to walk in hospital room?: Total Help needed climbing 3-5 steps with a railing? : Total 6 Click Score: 12    End of Session Equipment Utilized During Treatment: Oxygen Activity Tolerance: Patient limited by fatigue Patient left: in bed;with call bell/phone within reach;with bed alarm set Nurse Communication: Mobility status (patient requesting eye drops) PT Visit Diagnosis: Muscle weakness (generalized) (M62.81);Difficulty in walking, not elsewhere classified (R26.2);Unsteadiness on feet (R26.81);History of falling (Z91.81)     Time: 6144-3154 PT Time Calculation (min) (ACUTE ONLY): 23  min  Charges:  $Therapeutic Activity: 23-37 mins                     Minna Merritts, PT, MPT    Percell Locus 09/15/2022, 11:29 AM

## 2022-09-15 NOTE — Assessment & Plan Note (Signed)
Continue levothyroxine 

## 2022-09-15 NOTE — Discharge Summary (Addendum)
Physician Discharge Summary   Patient: Louis Sanchez MRN: [id number] DOB: [birth year]  Admit date:     09/07/2022  Discharge date: 09/16/2022  Discharge Physician: Ezekiel Slocumb   PCP: Adin Hector, MD   Recommendations at discharge:   Follow up with Cardiology Follow up with Primary Care Continue wound care as outlined below Repeat CBC, BMP, Mg in 1-2 weeks   2/22 PM ADDENDUM -- DISCHARGE CANCELLED Patient unable to return to ILF due to profound generalized weakness and inadequate assistance at home.   2/23 ADDENDUM -- Pt remains stable for discharge today and has now agreed to rehab.   Has a bed at [facility].  Discharge today.   Discharge Diagnoses: Principal Problem:   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 heart failure) (HCC) Active Problems:   Hemoptysis   Hypotension   Dyslipidemia   Hypothyroidism   Anxiety, mild   Chronic atrial fibrillation (HCC)   Pleural effusion on right   Permanent atrial fibrillation (HCC)   Hypokalemia   Cirrhosis (HCC)   Acute on chronic respiratory failure with hypoxia (HCC)   Pressure injury of skin  Resolved Problems:   * No resolved hospital problems. *  Hospital Course:  Louis Sanchez is a 82 y.o. 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   Hospital course complicated by mildly decompensated heart failure requiring diuresis, electrolyte derangements. Patient has profound and worsening generalized weakness, needing SNF/rehab prior to return home.  He has been refusing this, plan was to return home with [facility], but now far too weak to be safe at home as he is unable to even do transfers without physical assistance.   Assessment and Plan: * 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 heart failure) (HCC) Improved.   Continue  Aldactone and Lasix. Continue Daily Weights  Infiltrates on CT chest likely due to edema.   2D echo in September 2023 showed EF estimated at 60 to 65%, normal LV diastolic parameters, moderate TR, moderate MR, mild to moderate TR  Hemoptysis Patient presenting with a couple day history of bright red small specks mixed in sputum intermittently.  This is in the setting of increased cough over the last several days.  CT chest obtained in the ED also demonstrates increasing opacities.   Resolved. Seen by pulmonology.  Attributed to having nebulized TXA prior to occurrence. No further workup recommended.   Influenza, RSV and COVID tests were negative.  Briefly treated with antibiotics for ?pneumonia which is felt unlikely.  Pt stable off antibiotics and having undergone diuresis  Hypotension Continue home midodrine.  Dyslipidemia Continue Crestor  Hypothyroidism Continue levothyroxine  Pressure injury of skin POA.  I agree with the wound descriptions as outlined below. Patient seen by Wound Care RN. Wound care recommended as follows: 1. Cleanse bilateral foot wounds with normal saline, then top with a piece of calcium alginate Kellie Simmering # [id number]) and cover with an ABD pads and Kerlix. 2. Apply barrier cream and antifungal powder to scrotum with each turning and cleaning session 3. Foam dressing to sacrum, right posterior shoulder, and left back.  Change every 3 days or as needed for any soiling  Pressure Injury 08/30/22 Coccyx Mid Deep Tissue Pressure Injury - Purple or maroon localized area of discolored intact skin or blood-filled blister due to damage of underlying soft tissue from  pressure and/or shear. intact purple wound (Active)  08/30/22 1635  Location: Coccyx  Location Orientation: Mid  Staging: Deep Tissue Pressure Injury - Purple or maroon localized area of discolored intact skin or blood-filled blister due to damage of underlying soft tissue from pressure and/or shear.  Wound  Description (Comments): intact purple wound  Present on Admission: Yes    Home Health RN ordered to follow after discharge and closely monitor.  Acute on chronic respiratory failure with hypoxia (HCC) Back to baseline.   He was previously on oxygen via heated humidified HFNC. Now tolerating 2 L/min oxygen via [place] which is his baseline   Cirrhosis ([place]) Compensated. Continue Lasix and Aldactone   Hypokalemia K has been replaced multiple times. Will start on low dose daily K supplement. Monitor Bmp at follow up, daily while admitted.  Permanent atrial fibrillation (HCC) Continue Xarelto and low dose Toprol  Pleural effusion on right Chronic.   Chest imaging reviewed by interventional radiologist.  Right effusion is felt to be chronic and not amenable to thoracentesis.   Chronic atrial fibrillation (HCC) Patient currently in atrial fibrillation, however rates ranging between low 60s and 70s.  Given hypotension, will decrease home metoprolol.  - Decrease home metoprolol to 12.5 mg daily - Due to decreased GFR, decrease home Xarelto to 15 mg daily  Anxiety, mild Continue Zoloft          Consultants: None Procedures performed: None  Disposition: Skilled nursing facility (planned [facility] but too weak)  Diet recommendation:  Discharge Diet Orders (From admission, onward)     Start     Ordered   09/09/22 0000  Diet - low sodium heart healthy        09/09/22 1429            DISCHARGE MEDICATION: Allergies as of 09/15/2022       Reactions   Atorvastatin    Other reaction(s): Muscle Pain Other reaction(s): Muscle Pain Other reaction(s): Muscle Pain Other reaction(s): Muscle Pain Other reaction(s): Muscle Pain   Levofloxacin Rash        Medication List     TAKE these medications    ferrous sulfate 325 (65 FE) MG tablet Take 325 mg by mouth at bedtime.   furosemide 40 MG tablet Commonly known as: LASIX Take 1 tablet (40 mg total) by mouth 2 (two) times  daily.   gabapentin 300 MG capsule Commonly known as: NEURONTIN Take 1 capsule (300 mg total) by mouth 3 (three) times daily.   levothyroxine 175 MCG tablet Commonly known as: SYNTHROID Take 175 mcg by mouth See admin instructions. Take 1 tablet (134mg) by mouth every Monday, Tuesday, Wednesday, Thursday, Friday and Saturday morning before breakfast   metoprolol succinate 25 MG 24 hr tablet Commonly known as: Toprol XL Take 0.5 tablets (12.5 mg total) by mouth at bedtime.   midodrine 5 MG tablet Commonly known as: PROAMATINE Take 1 tablet (5 mg total) by mouth 3 (three) times daily with meals.   multivitamin tablet Take 1 tablet by mouth daily.   nitroGLYCERIN 0.4 MG SL tablet Commonly known as: NITROSTAT Place 0.4 mg under the tongue every 5 (five) minutes as needed for chest pain.   polyvinyl alcohol 1.4 % ophthalmic solution Commonly known as: LIQUIFILM TEARS Place 1 drop into both eyes as needed for dry eyes.   potassium chloride SA 20 MEQ tablet Commonly known as: KLOR-CON M Take 1 tablet (20 mEq total) by mouth daily. Start taking on: September 16, 2022  rivaroxaban 20 MG Tabs tablet Commonly known as: XARELTO Take 1 tablet (20 mg total) by mouth daily with supper.   rosuvastatin 10 MG tablet Commonly known as: CRESTOR Take 10 mg by mouth at bedtime.   sertraline 100 MG tablet Commonly known as: ZOLOFT Take 150 mg by mouth daily.   sodium chloride 0.65 % Soln nasal spray Commonly known as: OCEAN Place 1 spray into both nostrils as needed for congestion.   spironolactone 25 MG tablet Commonly known as: ALDACTONE Take 1 tablet (25 mg total) by mouth daily.   tiotropium 18 MCG inhalation capsule Commonly known as: SPIRIVA Place 18 mcg into inhaler and inhale daily.               Discharge Care Instructions  (From admission, onward)           Start     Ordered   09/15/22 0000  Discharge wound care:       Comments: 1. Cleanse bilat foot  wounds with NS, then top with a piece of calcium alginate and cover with an ABD pads and Kerlix. 2. Apply barrier cream and antifungal powder to scrotum with each turning and cleaning session 3. Foam dressing to sacrum, right posterior shoulder, and left back.  Change Q 3 days or PRN soiling   09/15/22 1110   09/09/22 0000  Discharge wound care:       Comments: 1. Cleanse bilat foot wounds with NS, then top with a piece of calcium alginate Kellie Simmering # [phone]) and cover with an ABD pads and Kerlix. 2. Apply barrier cream and antifungal powder to scrotum with each turning and cleaning session 3. Foam dressing to sacrum, right posterior shoulder, and left back.  Change Q 3 days or PRN soiling   09/09/22 1429            Discharge Exam: Filed Weights   09/13/22 0830 09/14/22 0823 09/15/22 0500  Weight: 60.3 kg 61.2 kg 59.9 kg   General exam: awake, drowsy appearing, no acute distress, underweight, frail, chronically ill appearing HEENT: atraumatic, clear conjunctiva, anicteric sclera, moist mucus membranes, hearing grossly normal  Respiratory system: CTAB diminished bases R>L, no wheezes, rales or rhonchi, normal respiratory effort. Cardiovascular system: normal S1/S2, RRR, no pedal edema.   Gastrointestinal system: soft, NT, ND, no HSM felt, +bowel sounds. Central nervous system: A&O x3. no gross focal neurologic deficits, normal speech Extremities: lower extremity venous stasis, no edema, normal tone Skin: dry, intact, normal temperature Psychiatry: normal mood, congruent affect, judgement and insight appear normal   Condition at discharge: stable  The results of significant diagnostics from this hospitalization (including imaging, microbiology, ancillary and laboratory) are listed below for reference.   Imaging Studies: DG Chest 1 View  Result Date: 09/07/2022 CLINICAL DATA:  Short of breath.  History of lung cancer EXAM: CHEST  1 VIEW COMPARISON:  09/07/2022 FINDINGS: Anterior  cervical fusion. Stable enlarged cardiac silhouette. Bilateral pleural effusions with low lung volumes. Consolidation at the RIGHT lung base unchanged. No pneumothorax. IMPRESSION: 1. No significant interval change. 2. Bilateral pleural effusions and RIGHT basilar consolidation. 3. Cardiomegaly. Electronically Signed   By: Suzy Bouchard M.D.   On: 09/07/2022 19:26   CT Chest Wo Contrast  Result Date: 09/07/2022 CLINICAL DATA:  Aspiration. Coughing. Hemoptysis. History of lung cancer. EXAM: CT CHEST WITHOUT CONTRAST TECHNIQUE: Multidetector CT imaging of the chest was performed following the standard protocol without IV contrast. RADIATION DOSE REDUCTION: This exam was performed according to the departmental  dose-optimization program which includes automated exposure control, adjustment of the mA and/or kV according to patient size and/or use of iterative reconstruction technique. COMPARISON:  Chest x-ray 09/07/2022 earlier. Old CT scan 08/29/2022 and older FINDINGS: Cardiovascular: Heart is large with significant multifocal chamber enlargement. Stable pericardial effusion, small. Coronary artery calcifications are seen. On this non IV contrast exam, the aorta has a normal course and caliber overall with scattered vascular calcifications. Only mild ectasia of the ascending aorta approaching 4 cm. Mediastinum/Nodes: No specific abnormal lymph node enlargement identified in the axillary regions, hilum. There is some prominent mediastinal nodes once again identified. Example left of the arch on series 2, image 44 measuring 16 by 11 mm, unchanged. Normal caliber thoracic esophagus. Small thyroid. Prominent right supraclavicular nodes are again seen. Lungs/Pleura: Motion. Increasing size of the small left pleural effusion. Adjacent parenchymal lung opacities are increasing at the left lower lobe. Acute infiltrates possible. There are increasing areas of patchy confluent ground-glass in the left upper lobe as well  superiorly both anterior and posterior. Acute infiltrates possible. The opacity seen previously in the right lower posterior lung is also increasing and becoming more confluent with air bronchograms. Also persistent interstitial changes thickening of the right upper lung. Underlying emphysematous changes. There is a loculated right-sided pleural effusion posteroinferior with pleural thickening and calcification which is stable. Surgical changes along the right lung from right lower lobectomy Upper Abdomen: Ascites in the upper abdomen. Mesenteric stranding. There is significant artifact from the arms being scanned at the patient's side. There is some nodular thickening in the area of the left adrenal gland, unchanged from prior. Please correlate with any prior workup. Musculoskeletal: Anasarca. Motion. Curvature and degenerative changes along the spine. Fixation hardware along the lower cervical spine. IMPRESSION: Compared to the most recent chest CT scan there are increasing areas of parenchymal opacity bilaterally particularly left lower and upper lobe but also areas in the right lung. Acute infiltrates possible. Recommend follow-up. Increasing small left pleural effusion. Stable loculated right effusion with pleural thickening. Markedly enlarged heart. Increasing anasarca. Component of lung edema are not excluded. Stable prominent to mildly enlarged supraclavicular right-sided nodes and mediastinal nodes. Attention on follow-up Aortic Atherosclerosis (ICD10-I70.0) and Emphysema (ICD10-J43.9). Electronically Signed   By: Jill Side M.D.   On: 09/07/2022 11:38   DG Chest 2 View  Result Date: 09/07/2022 CLINICAL DATA:  Hemoptysis versus upper GI bleeding EXAM: CHEST - 2 VIEW COMPARISON:  CT chest dated 08/29/2022 FINDINGS: Status post right lower lobectomy. Patchy/masslike right lower lung opacity with additional patchy left lower lobe opacities, better evaluated on recent CT, favoring multifocal pneumonia,  possibly on the basis of aspiration. Small to moderate loculated right pleural effusion, chronic. Small left pleural effusion. No frank interstitial edema.  No pneumothorax. Cardiomegaly. Cervical spine fixation hardware. IMPRESSION: Status post right lower lobectomy. Multifocal patchy lower lung opacities, right greater than left, favoring multifocal pneumonia, possibly on the basis of aspiration. This is better evaluated on recent CT. Follow-up chest radiographs are suggested in 4-6 weeks to document clearance. Small to moderate loculated right pleural effusion, chronic. Small left pleural effusion. No frank interstitial edema. Electronically Signed   By: Julian Hy M.D.   On: 09/07/2022 10:45   [place] CHEST (PLEURAL EFFUSION)  Result Date: 08/30/2022 CLINICAL DATA:  Right pleural effusion EXAM: CHEST ULTRASOUND COMPARISON:  None Available. FINDINGS: Focused sonographic exam of the right chest was performed for fluid assessment. Images demonstrate small amount of heterogeneous fluid in the right posterior  pleural space, not amenable to image guided thoracentesis. IMPRESSION: Small amount of heterogeneous fluid in the right posterior pleural space, not amenable for image guided thoracentesis. No intervention performed. This fluid has been described as a chronic loculated effusion on recent cross-sectional imaging. Electronically Signed   By: Albin Felling M.D.   On: 08/30/2022 12:41   CT CHEST WO CONTRAST  Result Date: 08/29/2022 CLINICAL DATA:  Blunt trauma.  Multiple falls. EXAM: CT CHEST WITHOUT CONTRAST TECHNIQUE: Multidetector CT imaging of the chest was performed following the standard protocol without IV contrast. RADIATION DOSE REDUCTION: This exam was performed according to the departmental dose-optimization program which includes automated exposure control, adjustment of the mA and/or kV according to patient size and/or use of iterative reconstruction technique. COMPARISON:  CT chest 12/02/2021  FINDINGS: Cardiovascular: Heart is enlarged. The LEFT and RIGHT atrium are markedly enlarged similar to prior. No pericardial fluid. Coronary calcifications present. Mediastinum/Nodes: No mediastinal hilar adenopathy. Multiple small RIGHT supraclavicular nodes. Lungs/Pleura: Chronic loculated pleural fluid in the RIGHT lower lobe with thickened rim. Findings unchanged from prior. There is consolidation in the RIGHT lower lobe (image 99/4) which is new from prior. Small LEFT effusion.  No pneumothorax. Upper Abdomen: Limited view of the liver, kidneys, pancreas are unremarkable. Normal adrenal glands. Small amount of intraperitoneal free fluid along the margin liver which simple fluid attenuation. Musculoskeletal: No evidence of rib fracture scapular fracture. No sternal fracture IMPRESSION: 1. Chronic loculated RIGHT pleural effusion. 2. New consolidation in the RIGHT lower lobe is concerning for pneumonia or aspiration pneumonitis. 3. No evidence of thoracic trauma.  No fracture or pneumothorax. 4. Small amount of free fluid in the upper abdomen. 5. Anasarca of the soft tissues suggest volume overload. Markedly enlarged cardiac atria. 6. Small RIGHT supraclavicular nodes are favored reactive. Electronically Signed   By: Suzy Bouchard M.D.   On: 08/29/2022 14:50   CT HEAD WO CONTRAST (5MM)  Result Date: 08/29/2022 CLINICAL DATA:  Multiple falls since Friday. Right chest bruising. Short of breath. Slurred speech. EXAM: CT HEAD WITHOUT CONTRAST CT CERVICAL SPINE WITHOUT CONTRAST TECHNIQUE: Multidetector CT imaging of the head and cervical spine was performed following the standard protocol without intravenous contrast. Multiplanar CT image reconstructions of the cervical spine were also generated. RADIATION DOSE REDUCTION: This exam was performed according to the departmental dose-optimization program which includes automated exposure control, adjustment of the mA and/or kV according to patient size and/or use  of iterative reconstruction technique. COMPARISON:  10/02/2021 FINDINGS: CT HEAD FINDINGS Brain: No evidence of acute infarction, hemorrhage, hydrocephalus, extra-axial collection or mass lesion/mass effect. Vascular: No hyperdense vessel or unexpected calcification. Skull: Normal. Negative for fracture or focal lesion. Sinuses/Orbits: Globes and orbits are unremarkable. Visualized sinuses are clear. Other: None. CT CERVICAL SPINE FINDINGS Alignment: Normal. Skull base and vertebrae: No acute fracture. No primary bone lesion or focal pathologic process. Soft tissues and spinal canal: No prevertebral fluid or swelling. No visible canal hematoma. Disc levels: Status post anterior cervical fusion at C6-C7. Screws and anterior fixation plate are well seated and unchanged. There is mature bone graft material extending across the disc interspace. Mild loss of disc height at C2-C3. Moderate loss of disc height from C3-C4 through C6-C7. Disc bulging and endplate spurring at these levels. No convincing disc herniation. Facet degenerative change most evident on the left at C3-C4. Upper chest: Right upper chest soft tissue edema. Other: None. IMPRESSION: HEAD CT 1. No acute intracranial abnormalities. CERVICAL CT 1. No fracture or acute  skeletal abnormality. 2. Right neck base soft tissue edema. Please refer to the current chest CT. Electronically Signed   By: Lajean Manes M.D.   On: 08/29/2022 14:48   CT Cervical Spine Wo Contrast  Result Date: 08/29/2022 CLINICAL DATA:  Multiple falls since Friday. Right chest bruising. Short of breath. Slurred speech. EXAM: CT HEAD WITHOUT CONTRAST CT CERVICAL SPINE WITHOUT CONTRAST TECHNIQUE: Multidetector CT imaging of the head and cervical spine was performed following the standard protocol without intravenous contrast. Multiplanar CT image reconstructions of the cervical spine were also generated. RADIATION DOSE REDUCTION: This exam was performed according to the departmental  dose-optimization program which includes automated exposure control, adjustment of the mA and/or kV according to patient size and/or use of iterative reconstruction technique. COMPARISON:  10/02/2021 FINDINGS: CT HEAD FINDINGS Brain: No evidence of acute infarction, hemorrhage, hydrocephalus, extra-axial collection or mass lesion/mass effect. Vascular: No hyperdense vessel or unexpected calcification. Skull: Normal. Negative for fracture or focal lesion. Sinuses/Orbits: Globes and orbits are unremarkable. Visualized sinuses are clear. Other: None. CT CERVICAL SPINE FINDINGS Alignment: Normal. Skull base and vertebrae: No acute fracture. No primary bone lesion or focal pathologic process. Soft tissues and spinal canal: No prevertebral fluid or swelling. No visible canal hematoma. Disc levels: Status post anterior cervical fusion at C6-C7. Screws and anterior fixation plate are well seated and unchanged. There is mature bone graft material extending across the disc interspace. Mild loss of disc height at C2-C3. Moderate loss of disc height from C3-C4 through C6-C7. Disc bulging and endplate spurring at these levels. No convincing disc herniation. Facet degenerative change most evident on the left at C3-C4. Upper chest: Right upper chest soft tissue edema. Other: None. IMPRESSION: HEAD CT 1. No acute intracranial abnormalities. CERVICAL CT 1. No fracture or acute skeletal abnormality. 2. Right neck base soft tissue edema. Please refer to the current chest CT. Electronically Signed   By: Lajean Manes M.D.   On: 08/29/2022 14:48   DG Chest Portable 1 View  Result Date: 08/29/2022 CLINICAL DATA:  Fall EXAM: PORTABLE CHEST 1 VIEW COMPARISON:  Chest x-ray dated August 07, 2022 FINDINGS: Unchanged cardiomegaly. Right hemithorax volume loss, similar to prior. Moderate right pleural effusion and right lower lung opacities, unchanged when compared with prior exam. No evidence of pneumothorax. IMPRESSION: Moderate right  pleural effusion and right lower lung opacities, unchanged when compared with the prior Electronically Signed   By: Yetta Glassman M.D.   On: 08/29/2022 14:34    Microbiology: Results for orders placed or performed during the hospital encounter of 09/07/22  Blood culture (routine x 2)     Status: None   Collection Time: 09/07/22 12:40 PM   Specimen: BLOOD  Result Value Ref Range Status   Specimen Description BLOOD RIGHT ARM  Final   Special Requests   Final    BOTTLES DRAWN AEROBIC AND ANAEROBIC Blood Culture results may not be optimal due to an excessive volume of blood received in culture bottles   Culture   Final    NO GROWTH 5 DAYS Performed at [facility], [address] [place], [place] [address]    Report Status 09/12/2022 FINAL  Final  Blood culture (routine x 2)     Status: None   Collection Time: 09/07/22  3:03 PM   Specimen: BLOOD  Result Value Ref Range Status   Specimen Description BLOOD LEFT ANTECUBITAL  Final   Special Requests   Final    BOTTLES DRAWN AEROBIC AND ANAEROBIC  Blood Culture adequate volume   Culture   Final    NO GROWTH 5 DAYS Performed at [facility], [address] [place], [place] [address]    Report Status 09/13/2022 FINAL  Final  MRSA Next Gen by PCR, Nasal     Status: None   Collection Time: 09/08/22  9:10 PM   Specimen: Anterior Nasal Swab  Result Value Ref Range Status   MRSA by PCR Next Gen NOT DETECTED NOT DETECTED Final    Comment: (NOTE) The GeneXpert MRSA Assay (FDA approved for NASAL specimens only), is one component of a comprehensive MRSA colonization surveillance program. It is not intended to diagnose MRSA infection nor to guide or monitor treatment for MRSA infections. Test performance is not FDA approved in patients less than 18 years old. Performed at [facility], [address] [place], [place] [address]   Resp panel by RT-PCR (RSV, Flu A&B, Covid) Anterior Nasal Swab     Status: None    Collection Time: 09/09/22  9:40 AM   Specimen: Anterior Nasal Swab  Result Value Ref Range Status   SARS Coronavirus 2 by RT PCR NEGATIVE NEGATIVE Final    Comment: (NOTE) SARS-CoV-2 target nucleic acids are NOT DETECTED.  The SARS-CoV-2 RNA is generally detectable in upper respiratory specimens during the acute phase of infection. The lowest concentration of SARS-CoV-2 viral copies this assay can detect is 138 copies/mL. A negative result does not preclude SARS-Cov-2 infection and should not be used as the sole basis for treatment or other patient management decisions. A negative result may occur with  improper specimen collection/handling, submission of specimen other than nasopharyngeal swab, presence of viral mutation(s) within the areas targeted by this assay, and inadequate number of viral copies(<138 copies/mL). A negative result must be combined with clinical observations, patient history, and epidemiological information. The expected result is Negative.  Fact Sheet for Patients:  EntrepreneurPulse.com.au  Fact Sheet for Healthcare Providers:  IncredibleEmployment.be  This test is no t yet approved or cleared by the [place] FDA and  has been authorized for detection and/or diagnosis of SARS-CoV-2 by FDA under an Emergency Use Authorization (EUA). This EUA will remain  in effect (meaning this test can be used) for the duration of the COVID-19 declaration under Section 564(b)(1) of the Act, 21 U.S.C.section 360bbb-3(b)(1), unless the authorization is terminated  or revoked sooner.       Influenza A by PCR NEGATIVE NEGATIVE Final   Influenza B by PCR NEGATIVE NEGATIVE Final    Comment: (NOTE) The Xpert Xpress SARS-CoV-2/FLU/RSV plus assay is intended as an aid in the diagnosis of influenza from Nasopharyngeal swab specimens and should not be used as a sole basis for treatment. Nasal washings and aspirates are unacceptable for  Xpert Xpress SARS-CoV-2/FLU/RSV testing.  Fact Sheet for Patients: EntrepreneurPulse.com.au  Fact Sheet for Healthcare Providers: IncredibleEmployment.be  This test is not yet approved or cleared by the [place] FDA and has been authorized for detection and/or diagnosis of SARS-CoV-2 by FDA under an Emergency Use Authorization (EUA). This EUA will remain in effect (meaning this test can be used) for the duration of the COVID-19 declaration under Section 564(b)(1) of the Act, 21 U.S.C. section 360bbb-3(b)(1), unless the authorization is terminated or revoked.     Resp Syncytial Virus by PCR NEGATIVE NEGATIVE Final    Comment: (NOTE) Fact Sheet for Patients: EntrepreneurPulse.com.au  Fact Sheet for Healthcare Providers: IncredibleEmployment.be  This test is not yet approved or cleared by the  [place] Architectural technologist and has been authorized for detection and/or diagnosis of SARS-CoV-2 by FDA under an Print production planner (EUA). This EUA will remain in effect (meaning this test can be used) for the duration of the COVID-19 declaration under Section 564(b)(1) of the Act, 21 U.S.C. section 360bbb-3(b)(1), unless the authorization is terminated or revoked.  Performed at [facility], [place], [place] [address]   Respiratory (~20 pathogens) panel by PCR     Status: None   Collection Time: 09/09/22  9:40 AM   Specimen: Anterior Nasal Swab; Respiratory  Result Value Ref Range Status   Adenovirus NOT DETECTED NOT DETECTED Final   Coronavirus 229E NOT DETECTED NOT DETECTED Final    Comment: (NOTE) The Coronavirus on the Respiratory Panel, DOES NOT test for the novel  Coronavirus (2019 nCoV)    Coronavirus HKU1 NOT DETECTED NOT DETECTED Final   Coronavirus NL63 NOT DETECTED NOT DETECTED Final   Coronavirus OC43 NOT DETECTED NOT DETECTED Final   Metapneumovirus NOT DETECTED NOT  DETECTED Final   Rhinovirus / Enterovirus NOT DETECTED NOT DETECTED Final   Influenza A NOT DETECTED NOT DETECTED Final   Influenza B NOT DETECTED NOT DETECTED Final   Parainfluenza Virus 1 NOT DETECTED NOT DETECTED Final   Parainfluenza Virus 2 NOT DETECTED NOT DETECTED Final   Parainfluenza Virus 3 NOT DETECTED NOT DETECTED Final   Parainfluenza Virus 4 NOT DETECTED NOT DETECTED Final   Respiratory Syncytial Virus NOT DETECTED NOT DETECTED Final   Bordetella pertussis NOT DETECTED NOT DETECTED Final   Bordetella Parapertussis NOT DETECTED NOT DETECTED Final   Chlamydophila pneumoniae NOT DETECTED NOT DETECTED Final   Mycoplasma pneumoniae NOT DETECTED NOT DETECTED Final    Comment: Performed at [facility], [place]. [address], [place], [place] [address]    Labs: CBC: Recent Labs  Lab 09/09/22 0515  WBC 6.6  NEUTROABS 5.4  HGB 10.6*  HCT 35.3*  MCV 93.4  PLT 0000000   Basic Metabolic Panel: Recent Labs  Lab 09/09/22 0515 09/09/22 1322 09/11/22 0456 09/14/22 0327 09/15/22 0416  NA 135  --  138  --  134*  K 3.0* 3.4* 3.6 3.4* 3.9  CL 91*  --  92*  --  89*  CO2 35*  --  42*  --  38*  GLUCOSE 114*  --  108*  --  97  BUN 21  --  17  --  15  CREATININE 1.14  --  1.06  --  0.92  CALCIUM 7.9*  --  7.9*  --  7.7*  MG  --   --  2.2  --   --    Liver Function Tests: No results for input(s): "AST", "ALT", "ALKPHOS", "BILITOT", "PROT", "ALBUMIN" in the last 168 hours. CBG: Recent Labs  Lab 09/08/22 2006  GLUCAP 149*    Discharge time spent: greater than 30 minutes.  Signed: Ezekiel Slocumb, DO [facility] 09/15/2022

## 2022-09-15 NOTE — Assessment & Plan Note (Signed)
Continue Zoloft 

## 2022-09-15 NOTE — Assessment & Plan Note (Signed)
K has been replaced multiple times. Will start on low dose daily K supplement. Monitor Bmp at follow up, daily while admitted.

## 2022-09-15 NOTE — Assessment & Plan Note (Signed)
Chronic.   Chest imaging reviewed by interventional radiologist.  Right effusion is felt to be chronic and not amenable to thoracentesis.

## 2022-09-15 NOTE — Assessment & Plan Note (Signed)
Continue Xarelto and low dose Toprol

## 2022-09-15 NOTE — Assessment & Plan Note (Signed)
Improved.   Continue Aldactone and Lasix. Continue Daily Weights  Infiltrates on CT chest likely due to edema.   2D echo in September 2023 showed EF estimated at 60 to 65%, normal LV diastolic parameters, moderate TR, moderate MR, mild to moderate TR

## 2022-09-15 NOTE — Care Management Important Message (Signed)
Important Message  Patient Details  Name: Louis Sanchez MRN: [id number] Date of Birth: [birth year]   Medicare Important Message Given:  Yes  Asleep upon time of visit, no family in room.  Copy of Medicare IM left in room Sanchez counter for reference.   Dannette Barbara 09/15/2022, 1:02 PM

## 2022-09-15 NOTE — Assessment & Plan Note (Signed)
Continue Crestor 

## 2022-09-16 DIAGNOSIS — I5033 Acute on chronic diastolic (congestive) heart failure: Secondary | ICD-10-CM | POA: Diagnosis not present

## 2022-09-16 LAB — BASIC METABOLIC PANEL
Anion gap: 7 (ref 5–15)
BUN: 16 mg/dL (ref 8–23)
CO2: 38 mmol/L — ABNORMAL HIGH (ref 22–32)
Calcium: 7.8 mg/dL — ABNORMAL LOW (ref 8.9–10.3)
Chloride: 87 mmol/L — ABNORMAL LOW (ref 98–111)
Creatinine, Ser: 0.98 mg/dL (ref 0.61–1.24)
GFR, Estimated: >60 mL/min (ref >60)
Glucose, Bld: 162 mg/dL — ABNORMAL HIGH (ref 70–99)
Potassium: 3.6 mmol/L (ref 3.5–5.1)
Sodium: 132 mmol/L — ABNORMAL LOW (ref 135–145)

## 2022-09-16 NOTE — TOC Transition Note (Addendum)
Transition of Care [facility]) - CM/SW Discharge Note   Patient Details  Name: Louis Sanchez MRN: [id number] Date of Birth: 10-02-1940  [facility] CM/SW Contact:  Laurena Slimmer, RN Phone Number: 09/16/2022, 11:22 AM   Clinical Narrative:    Discharge order received  Discharge summary and SNF transfer report sent to the facility in the [place].  Patient assigned to room #603 Nurse will call report to [phone] Face sheet and medical necessity forms printed to the floor to be added to the EMS packet. EMS arranged   TOC signing off     Final next level of care: Skilled Nursing Facility Barriers to Discharge: Barriers Resolved   Patient Goals and CMS Choice CMS Medicare.gov Compare Post Acute Care list provided to:: Patient Choice offered to / list presented to : Patient  Discharge Placement                Patient [facility] bed at: [facility] Patient to be transferred to facility by: [facility] Name of family member notified: Shirlean Mylar [place],[phone] Patient and family notified of of transfer: 09/16/22  Discharge Plan and Services Additional resources added to the After Visit Summary for                                       Social Determinants of Health (SDOH) Interventions SDOH Screenings   Food Insecurity: No Food Insecurity (09/08/2022)  Housing: Low Risk  (09/08/2022)  Transportation Needs: No Transportation Needs (09/08/2022)  Utilities: Not At Risk (09/08/2022)  Tobacco Use: Medium Risk (09/08/2022)     Readmission Risk Interventions     No data to display

## 2022-09-16 NOTE — Progress Notes (Signed)
Physical Therapy Treatment Patient Details Name: Louis Sanchez MRN: [id number] DOB: November 13, 1940 Today's Date: 09/16/2022   History of Present Illness Louis Sanchez is a 82 y.o. 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     Louis Sanchez states that over the last 1 to 2 days, he has noticed little specks of bright red blood in his sputum.    PT Comments    Patient continues to require physical assistance with mobility. Max A needed today to stand after trying multiple times unsuccessfully. Posterior lean present with transition from sit to stand and while standing, requiring cues and anterior weight shifting facilitation. Continue to recommend SNF placement at discharge. PT will follow while in the hospital to maximize independence and decrease caregiver burden.    Recommendations for follow up therapy are one component of a multi-disciplinary discharge planning process, led by the attending physician.  Recommendations may be updated based on patient status, additional functional criteria and insurance authorization.  Follow Up Recommendations  Skilled nursing-short term rehab (<3 hours/day) Can patient physically be transported by private vehicle: No   Assistance Recommended at Discharge Frequent or constant Supervision/Assistance  Patient can return home with the following A lot of help with walking and/or transfers;A little help with bathing/dressing/bathroom;Assist for transportation;Help with stairs or ramp for entrance;Assistance with cooking/housework   Equipment Recommendations  None recommended by PT    Recommendations for Other Services       Precautions / Restrictions Precautions Precautions: Fall Restrictions Weight Bearing Restrictions: No     Mobility  Bed Mobility Overal bed mobility:  Needs Assistance Bed Mobility: Supine to Sit, Sit to Supine     Supine to sit: Min assist Sit to supine: Min assist   General bed mobility comments: assistance for scooting hips forward in preparation for standing. assistance also for RLE support to return to bed. verbal cues for technique    Transfers Overall transfer level: Needs assistance Equipment used: Rolling walker (2 wheels) Transfers: Sit to/from Stand Sit to Stand: Max assist           General transfer comment: patient attempted multiple time with assistance and was unable to stand despite maximal cues for technique. patient continues to have posterior bias and needs faciliation for anterior weight shifting. Max A was required today to achieve upright standing position    Ambulation/Gait               General Gait Details: with moderate assistance, patient able to take 2 steps to the right with unilateral UE support and faciliation for steadying and anterior weight shift. did not progress walking further due to poor balance   Stairs             Wheelchair Mobility    Modified Rankin (Stroke Patients Only)       Balance Overall balance assessment: Needs assistance Sitting-balance support: Feet supported Sitting balance-Leahy Scale: Fair   Postural control: Posterior lean Standing balance support: Single extremity supported Standing balance-Leahy Scale: Poor Standing balance comment: at least Mod A required to maintain standing balance, anterior weight shifting assistance provided                            Cognition Arousal/Alertness: Lethargic Behavior During Therapy: WFL for tasks assessed/performed Overall Cognitive Status: No family/caregiver  present to determine baseline cognitive functioning                                 General Comments: increased time required to follow commands        Exercises      General Comments        Pertinent Vitals/Pain  Pain Assessment Pain Assessment: No/denies pain    Home Living                          Prior Function            PT Goals (current goals can now be found in the care plan section) Acute Rehab PT Goals Patient Stated Goal: to be discharged PT Goal Formulation: With patient Time For Goal Achievement: 09/22/22 Potential to Achieve Goals: Good Progress towards PT goals: Progressing toward goals    Frequency    Min 2X/week      PT Plan Current plan remains appropriate    Co-evaluation              AM-PAC PT "6 Clicks" Mobility   Outcome Measure  Help needed turning from your back to your side while in a flat bed without using bedrails?: None Help needed moving from lying on your back to sitting on the side of a flat bed without using bedrails?: A Lot Help needed moving to and from a bed to a chair (including a wheelchair)?: A Lot Help needed standing up from a chair using your arms (e.g., wheelchair or bedside chair)?: A Lot Help needed to walk in hospital room?: Total Help needed climbing 3-5 steps with a railing? : Total 6 Click Score: 12    End of Session Equipment Utilized During Treatment: Oxygen Activity Tolerance: Patient limited by fatigue Patient left: in bed;with call bell/phone within reach;with bed alarm set Nurse Communication: Mobility status (discussed with nurse tech) PT Visit Diagnosis: Muscle weakness (generalized) (M62.81);Difficulty in walking, not elsewhere classified (R26.2);Unsteadiness on feet (R26.81);History of falling (Z91.81)     Time: [id number] PT Time Calculation (min) (ACUTE ONLY): 17 min  Charges:  $Therapeutic Activity: 8-22 mins                     Minna Merritts, PT, MPT    Percell Locus 09/16/2022, 11:52 AM

## 2022-09-16 NOTE — TOC Progression Note (Signed)
Transition of Care [facility]) - Progression Note    Patient Details  Name: Louis Sanchez MRN: [id number] Date of Birth: 01-Aug-1940  [facility] CM/SW Contact  Laurena Slimmer, RN Phone Number: 09/16/2022, 10:08 AM  Clinical Narrative:    Spoke with patient's daughter, Shirlean Mylar. Gave bed offer for [facility]. She is agreeable. She stated her [place] is the POA [place]. She will try to reach him to confirm if they are all agreeable to the bed offer.  Spoke with Sharyn Lull from [facility]. Patient bed offer is confirmed and he can be accepted today.    Expected Discharge Plan: Assisted Living Barriers to Discharge: Continued Medical Work up  Expected Discharge Plan and Services       Living arrangements for the past 2 months: [place] Expected Discharge Date: 09/15/22                                     Social Determinants of Health (SDOH) Interventions SDOH Screenings   Food Insecurity: No Food Insecurity (09/08/2022)  Housing: Low Risk  (09/08/2022)  Transportation Needs: No Transportation Needs (09/08/2022)  Utilities: Not At Risk (09/08/2022)  Tobacco Use: Medium Risk (09/08/2022)    Readmission Risk Interventions     No data to display

## 2022-09-21 ENCOUNTER — Emergency Department
Admission: EM | Admit: 2022-09-21 | Discharge: 2022-09-21 | Disposition: A | Discharge status: Home / Self Care | Payer: Medicare Other | Attending: Emergency Medicine | Admitting: Emergency Medicine

## 2022-09-21 ENCOUNTER — Emergency Department: Payer: Medicare Other

## 2022-09-21 ENCOUNTER — Telehealth: Payer: Self-pay | Admitting: Family

## 2022-09-21 ENCOUNTER — Encounter: Payer: Medicare Other | Admitting: Family

## 2022-09-21 DIAGNOSIS — Z85038 Personal history of other malignant neoplasm of large intestine: Secondary | ICD-10-CM | POA: Insufficient documentation

## 2022-09-21 DIAGNOSIS — I5033 Acute on chronic diastolic (congestive) heart failure: Secondary | ICD-10-CM | POA: Insufficient documentation

## 2022-09-21 DIAGNOSIS — Z87891 Personal history of nicotine dependence: Secondary | ICD-10-CM | POA: Diagnosis not present

## 2022-09-21 DIAGNOSIS — S01311A Laceration without foreign body of right ear, initial encounter: Secondary | ICD-10-CM | POA: Insufficient documentation

## 2022-09-21 DIAGNOSIS — Z85118 Personal history of other malignant neoplasm of bronchus and lung: Secondary | ICD-10-CM | POA: Insufficient documentation

## 2022-09-21 DIAGNOSIS — E039 Hypothyroidism, unspecified: Secondary | ICD-10-CM | POA: Insufficient documentation

## 2022-09-21 DIAGNOSIS — W010XXA Fall on same level from slipping, tripping and stumbling without subsequent striking against object, initial encounter: Secondary | ICD-10-CM | POA: Insufficient documentation

## 2022-09-21 DIAGNOSIS — Z85828 Personal history of other malignant neoplasm of skin: Secondary | ICD-10-CM | POA: Diagnosis not present

## 2022-09-21 DIAGNOSIS — Z7901 Long term (current) use of anticoagulants: Secondary | ICD-10-CM | POA: Insufficient documentation

## 2022-09-21 DIAGNOSIS — S0990XA Unspecified injury of head, initial encounter: Secondary | ICD-10-CM | POA: Insufficient documentation

## 2022-09-21 DIAGNOSIS — I110 Hypertensive heart disease with heart failure: Secondary | ICD-10-CM | POA: Insufficient documentation

## 2022-09-21 DIAGNOSIS — S0991XA Unspecified injury of ear, initial encounter: Secondary | ICD-10-CM | POA: Diagnosis present

## 2022-09-21 DIAGNOSIS — W19XXXA Unspecified fall, initial encounter: Secondary | ICD-10-CM

## 2022-09-21 DIAGNOSIS — Z8505 Personal history of malignant neoplasm of liver: Secondary | ICD-10-CM | POA: Diagnosis not present

## 2022-09-21 DIAGNOSIS — Z86012 Personal history of benign carcinoid tumor: Secondary | ICD-10-CM | POA: Diagnosis not present

## 2022-09-21 LAB — CBC WITH DIFFERENTIAL/PLATELET
Abs Immature Granulocytes: 0.02 10*3/uL (ref 0.00–0.07)
Basophils Absolute: 0.1 10*3/uL (ref 0.0–0.1)
Basophils Relative: 1 %
Eosinophils Absolute: 0 10*3/uL (ref 0.0–0.5)
Eosinophils Relative: 1 %
HCT: 37.8 % — ABNORMAL LOW (ref 39.0–52.0)
Hemoglobin: 11.3 g/dL — ABNORMAL LOW (ref 13.0–17.0)
Immature Granulocytes: 0 %
Lymphocytes Relative: 9 %
Lymphs Abs: 0.6 10*3/uL — ABNORMAL LOW (ref 0.7–4.0)
MCH: 28.1 pg (ref 26.0–34.0)
MCHC: 29.9 g/dL — ABNORMAL LOW (ref 30.0–36.0)
MCV: 94 fL (ref 80.0–100.0)
Monocytes Absolute: 0.6 10*3/uL (ref 0.1–1.0)
Monocytes Relative: 9 %
Neutro Abs: 5.2 10*3/uL (ref 1.7–7.7)
Neutrophils Relative %: 80 %
Platelets: 252 10*3/uL (ref 150–400)
RBC: 4.02 MIL/uL — ABNORMAL LOW (ref 4.22–5.81)
RDW: 17.7 % — ABNORMAL HIGH (ref 11.5–15.5)
WBC: 6.6 10*3/uL (ref 4.0–10.5)
nRBC: 0 % (ref 0.0–0.2)

## 2022-09-21 LAB — COMPREHENSIVE METABOLIC PANEL
ALT: 22 U/L (ref 0–44)
AST: 52 U/L — ABNORMAL HIGH (ref 15–41)
Albumin: 2.8 g/dL — ABNORMAL LOW (ref 3.5–5.0)
Alkaline Phosphatase: 143 U/L — ABNORMAL HIGH (ref 38–126)
Anion gap: 7 (ref 5–15)
BUN: 21 mg/dL (ref 8–23)
CO2: 35 mmol/L — ABNORMAL HIGH (ref 22–32)
Calcium: 8.3 mg/dL — ABNORMAL LOW (ref 8.9–10.3)
Chloride: 93 mmol/L — ABNORMAL LOW (ref 98–111)
Creatinine, Ser: 1.16 mg/dL (ref 0.61–1.24)
GFR, Estimated: >60 mL/min (ref >60)
Glucose, Bld: 102 mg/dL — ABNORMAL HIGH (ref 70–99)
Potassium: 3.9 mmol/L (ref 3.5–5.1)
Sodium: 135 mmol/L (ref 135–145)
Total Bilirubin: 0.9 mg/dL (ref 0.3–1.2)
Total Protein: 7.7 g/dL (ref 6.5–8.1)

## 2022-09-21 LAB — AMMONIA: Ammonia: 18 umol/L (ref 9–35)

## 2022-09-21 NOTE — Discharge Instructions (Signed)
Your blood work, CT scans, and x-rays were normal.  Please follow-up with your outpatient provider.  The laceration on your ear was closed with skin glue.  Continue to keep the area clean with soap and water.  Please return for any new, worsening, or change in symptoms or other concerns.  It was a pleasure caring for you today.

## 2022-09-21 NOTE — Telephone Encounter (Signed)
I placed call to [facility] where pt is currently residing. When looking at patient's chart for check in today, I noticed that he had been to ED. I called to see if patient was going to come to appt or reschedule. Anderson Malta called me back stating that the patient was not back yet from ED but she would have the nurse call us when they got back. We will need to R/S appt from today.

## 2022-09-21 NOTE — ED Triage Notes (Signed)
Pt brought in via ems due to fall 09/20/21 at [facility]. Pt has bleeding from right ear. Bleeding controlled on arrival.

## 2022-09-21 NOTE — ED Notes (Signed)
PTAR at bedside for transport.  

## 2022-09-21 NOTE — ED Provider Notes (Signed)
[facility] Provider Note    Event Date/Time   First MD Initiated Contact with Patient 09/21/22 1146     (approximate)   History   Fall   HPI  Louis Sanchez is a 82 y.o. 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  Patient reports that he remembers falling.  He reports he lost his balance on the way to the bathroom and struck the side of his head.  He denies loss of consciousness.  He reports that he has had oozing from his right ear.  He has not had any headaches or neck pain.  He denies chest pain or shortness of breath.  He denies any preceding symptoms prior to his fall.  He denies dizziness.  He has not had fevers or chills recently.  He reports that he otherwise feels his normal self.  Patient Active Problem List   Diagnosis Date Noted   Pressure injury of skin 09/15/2022   Hemoptysis 09/07/2022   Hypotension 09/07/2022   Cirrhosis ([place]) 09/07/2022   Acute on chronic respiratory failure with hypoxia ([place]) 09/07/2022   Severe sepsis ([place]) 08/29/2022   Acute respiratory failure with hypoxia ([place]) 08/29/2022   Aspiration pneumonia ([place]) 08/29/2022   AKI (acute kidney injury) ([place]) 08/29/2022   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 heart failure) ([place]) 08/10/2022   Permanent atrial fibrillation ([place]) 08/07/2022   Hypokalemia 08/07/2022   Pleural effusion due to CHF (congestive heart failure) ([place]) 04/11/2022   Iron deficiency anemia 04/11/2022   Anemia of chronic disease 02/18/2022   Essential hypertension 02/18/2022   Hypothyroidism 02/18/2022   Dyslipidemia 02/18/2022   CAP (community acquired pneumonia) 02/18/2022   Wound of foot 01/12/2021   Right ventricular dysfunction 10/27/2020   Acquired thrombophilia ([place]) 10/07/2020    Pleural effusion on right 04/08/2020   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/18/2019   Fusion of spine of cervical region 11/18/2019   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/18/2019   Neck pain 11/18/2019   Personal history of skin cancer 05/21/2019   Chronic anticoagulation 12/11/2016   Former tobacco use 12/11/2016   Malignant neoplasm of lower lobe of right lung ([place]) 11/28/2016   Aortic atherosclerosis ([place]) 10/07/2016   Hypothyroidism (acquired) 10/07/2016   Obstructive sleep apnea syndrome 10/07/2016   Thrombocytopenia ([place]) 10/07/2016   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[place]) 10/03/2016   Mitral regurgitation 09/20/2016   Acute bronchitis 09/13/2016   Leukocytosis 09/13/2016   Venous stasis dermatitis 09/13/2016   LFT elevation    Alcoholic gastritis    Atrial fibrillation with RVR ([place]) 09/11/2016   Anxiety, mild 05/23/2016   Hematochezia 05/23/2016   History of hypothyroidism 05/23/2016   Chronic atrial fibrillation ([place]) 05/03/2016   Ruptured varicose vein 01/25/2016   Varicose veins of both lower extremities with complications [id number]   Mitral valve insufficiency 08/25/2014   Inguinal hernia 02/18/2013   Actinic keratosis 01/21/2010   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      Excl. in [place]?     Most recent vital signs: Vitals:   09/21/22 1400 09/21/22 1530  BP: (!) 99/55 (!) 95/53  Pulse:  66  Resp: 16 16  Temp:  97.8 F (36.6 C)  SpO2: 100% 100%    Physical Exam Vitals and nursing note reviewed.  Constitutional:      General: Awake and alert. No acute distress.    Appearance: Normal appearance. The patient is  normal weight.  HENT:     Head: Normocephalic and atraumatic.     Mouth: Mucous membranes are moist.  Right TM with 1 cm linear superficial laceration to the antihelix.  Wound is well-approximated.  There is no hematoma.  No cauliflower ear.  Clear canal and intact tympanic membrane Eyes:     General: PERRL. Normal EOMs        Right eye: No discharge.        Left eye: No discharge.     Conjunctiva/sclera: Conjunctivae normal.  Cardiovascular:     Rate and Rhythm: Normal rate.     Pulses: Normal pulses.  Pulmonary:     Effort: Pulmonary effort is normal. No respiratory distress.     Breath sounds: Normal breath sounds.  Abdominal:     Abdomen is soft. There is no abdominal tenderness. No rebound or guarding. No distention. Musculoskeletal:        General: No swelling. Normal range of motion.     Cervical back: Normal range of motion and neck supple.  No midline cervical spine tenderness.  Full range of motion of neck.  Negative Spurling test.  Negative Lhermitte sign.  Normal strength and sensation in bilateral upper extremities. Normal grip strength bilaterally.  Normal intrinsic muscle function of the hand bilaterally.  Normal radial pulses bilaterally. Skin:    General: Skin is warm and dry.     Capillary Refill: Capillary refill takes less than 2 seconds.     Findings: No rash.  Neurological:     Mental Status: The patient is awake and alert.  Neurological: GCS 15 alert and oriented x3 Normal speech, no expressive or receptive aphasia or dysarthria Cranial nerves II through XII intact Normal visual fields 5 out of 5 strength in all 4 extremities with intact sensation throughout No extremity drift Normal finger-to-nose testing, no limb or truncal ataxia      ED Results / Procedures / Treatments   Labs (all labs ordered are listed, but only abnormal results are displayed) Labs Reviewed  CBC WITH DIFFERENTIAL/PLATELET - Abnormal; Notable for the following components:       Result Value   RBC 4.02 (*)    Hemoglobin 11.3 (*)    HCT 37.8 (*)    MCHC 29.9 (*)    RDW 17.7 (*)    Lymphs Abs 0.6 (*)    All other components within normal limits  COMPREHENSIVE METABOLIC PANEL - Abnormal; Notable for the following components:   Chloride 93 (*)    CO2 35 (*)    Glucose, Bld 102 (*)    Calcium 8.3 (*)    Albumin 2.8 (*)    AST 52 (*)    Alkaline Phosphatase 143 (*)  All other components within normal limits  AMMONIA     EKG     RADIOLOGY     PROCEDURES:  Critical Care performed:   Procedures   MEDICATIONS ORDERED IN ED: Medications - No data to display   IMPRESSION / MDM / [place] / ED COURSE  I reviewed the triage vital signs and the nursing notes.   Differential diagnosis includes, but is not limited to, laceration, intracranial hemorrhage, fracture, dislocation, metabolic encephalopathy, electrolyte disarray.  I reviewed the patient's chart.  Patient had a recent admission, discharged on 09/15/2022 for hemoptysis.  He was found to have acute on chronic diastolic heart failure, with infiltrates noted on his CT chest.  He was discharged to a rehab facility.  Presents emergency department awake and alert, able to tell me what happened, in no acute distress.  His blood pressures are soft, though per chart review his blood pressures are often soft and were softer during his previous hospital stay.  He was placed on the cardiac monitor.  Further workup is indicated.  IV was established and labs were obtained.  His labs are overall reassuring, no anemia or electrolyte abnormalities.  CT head and neck obtained given that this was an unwitnessed fall and that he is on Xarelto.  These were negative for any acute findings.  He has a small linear well-approximated laceration to his right ear which was closed with Dermabond after cleaning with chlorhexidine and normal saline.  X-rays were obtained which were reassuring.  Patient is able to  ambulate with a steady gait, unassisted.  He reports that he feels well and would like to be discharged.  The RN discussed with his facility, and he was transported back.  We discussed return precautions and the importance of close outpatient follow-up.  Patient understands and agrees with plan.  He was discharged in stable condition.  Patient's presentation is most consistent with acute presentation with potential threat to life or bodily function.   Clinical Course as of 09/21/22 1834  Wed Sep 21, 2022  1340 [place]: 18 [HH]    Clinical Course User Index [HH] [place], [place]     FINAL CLINICAL IMPRESSION(S) / ED DIAGNOSES   Final diagnoses:  Fall, initial encounter  Laceration of antihelix of right ear, initial encounter     Rx / DC Orders   ED Discharge Orders     None        Note:  This document was prepared using Dragon voice recognition software and may include unintentional dictation errors.   Emeline Gins 09/21/22 1834    Vanessa [place], MD 09/22/22 [phone]

## 2022-09-22 ENCOUNTER — Encounter: Payer: Self-pay | Admitting: Family

## 2022-09-22 ENCOUNTER — Ambulatory Visit: Payer: Medicare Other | Attending: Family | Admitting: Family

## 2022-09-22 VITALS — BP 99/52 | HR 77 | Resp 16 | Wt 118.0 lb

## 2022-09-22 DIAGNOSIS — I482 Chronic atrial fibrillation, unspecified: Secondary | ICD-10-CM | POA: Diagnosis not present

## 2022-09-22 DIAGNOSIS — E785 Hyperlipidemia, unspecified: Secondary | ICD-10-CM | POA: Diagnosis not present

## 2022-09-22 DIAGNOSIS — I10 Essential (primary) hypertension: Secondary | ICD-10-CM

## 2022-09-22 DIAGNOSIS — I5032 Chronic diastolic (congestive) heart failure: Secondary | ICD-10-CM | POA: Insufficient documentation

## 2022-09-22 DIAGNOSIS — K219 Gastro-esophageal reflux disease without esophagitis: Secondary | ICD-10-CM | POA: Insufficient documentation

## 2022-09-22 DIAGNOSIS — Z87891 Personal history of nicotine dependence: Secondary | ICD-10-CM | POA: Diagnosis not present

## 2022-09-22 DIAGNOSIS — Z7901 Long term (current) use of anticoagulants: Secondary | ICD-10-CM | POA: Insufficient documentation

## 2022-09-22 DIAGNOSIS — G4733 Obstructive sleep apnea (adult) (pediatric): Secondary | ICD-10-CM

## 2022-09-22 DIAGNOSIS — I110 Hypertensive heart disease with heart failure: Secondary | ICD-10-CM | POA: Diagnosis not present

## 2022-09-22 DIAGNOSIS — Z8673 Personal history of transient ischemic attack (TIA), and cerebral infarction without residual deficits: Secondary | ICD-10-CM | POA: Diagnosis not present

## 2022-09-22 DIAGNOSIS — R5383 Other fatigue: Secondary | ICD-10-CM | POA: Diagnosis not present

## 2022-09-22 DIAGNOSIS — C3431 Malignant neoplasm of lower lobe, right bronchus or lung: Secondary | ICD-10-CM

## 2022-09-22 DIAGNOSIS — I4891 Unspecified atrial fibrillation: Secondary | ICD-10-CM | POA: Insufficient documentation

## 2022-09-22 DIAGNOSIS — Z79899 Other long term (current) drug therapy: Secondary | ICD-10-CM | POA: Diagnosis not present

## 2022-09-22 DIAGNOSIS — G4730 Sleep apnea, unspecified: Secondary | ICD-10-CM | POA: Diagnosis not present

## 2022-09-22 DIAGNOSIS — F419 Anxiety disorder, unspecified: Secondary | ICD-10-CM | POA: Diagnosis not present

## 2022-09-22 NOTE — Progress Notes (Signed)
Patient ID: Louis Sanchez, male    DOB: [birth year], 82 y.o.   MRN: [id number]  HPI  Louis Sanchez is a 82 y/o male with a history of atrial fibrillation, hyperlipidemia, TIA, anxiety, GERD, HTN, thyroid disease, sleep apnea, metastatic neuroendocrine tumor, previous tobacco use and chronic heart failure.   Echo report from 04/11/22 reviewed and showed an EF of 60-65% along with mild LAE and moderate Louis/TR.   Was in the ED 09/21/22 due to mechanical fall resulting in ear laceration. Admitted twice in February due to aspiration pneumonia and pleural effusions. Admitted 08/07/22 due to SOB/ edema due to a/c heart failure. Diuresed. Admitted 04/10/22 due to chest pain, cough and fever. Due to hypotension, meds were held with parameters at home. Discharged after 2 days.   He presents today for a HF follow-up visit with a chief complaint of moderate fatigue upon minimal exertion. Describes this as chronic in nature. Has associated dizziness along with this. Denies any difficulty sleeping, abdominal distention, palpitations, pedal edema, chest pain, wheezing, SOB, cough or weight gain.   Currently at [facility] for rehab. Did have a recent fall which resulted in a laceration to his ear. Says that he's not really sure what happened when he fell.   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[facility]    Other and unspecified hyperlipidemia 1997   Other specified disorder of male genital organs(608.89) 08/2012   Sleep disturbance, unspecified    Thyroid disease    Unspecified hypothyroidism 2005   Past Surgical History:  Procedure  Laterality Date   APPENDECTOMY  1953   colon polyps  2009   HERNIA REPAIR  1957   Right   INGUINAL HERNIA REPAIR Right 2014   SPINE SURGERY  1997   neck   WRIST SURGERY     Family History  Problem Relation Age of Onset   Alzheimer's disease Mother    Social History   Tobacco Use   Smoking status: Former    Years: 25.00    Types: Cigarettes   Smokeless tobacco: Never  Substance Use Topics   Alcohol use: Not Currently    Alcohol/week: 15.0 standard drinks of alcohol    Types: 15 Standard drinks or equivalent per week   Allergies  Allergen Reactions   Atorvastatin     Other reaction(s): Muscle Pain Other reaction(s): Muscle Pain Other reaction(s): Muscle Pain Other reaction(s): Muscle Pain Other reaction(s): Muscle Pain    Levofloxacin Rash   Prior to Admission medications   Medication Sig Start Date End Date Taking? Authorizing Provider  ferrous sulfate 325 (65 FE) MG tablet Take 325 mg by mouth at bedtime.   Yes [provider]  furosemide (LASIX) 40 MG tablet Take 1 tablet (40 mg total) by mouth 2 (two) times daily. 08/10/22  Yes Wieting, Richard, MD  gabapentin (NEURONTIN) 300 MG capsule Take 1 capsule (300 mg total) by mouth 3 (three) times daily. 08/10/22  Yes Wieting, Richard, MD  levothyroxine (SYNTHROID) 175 MCG tablet Take 175 mcg by mouth  See admin instructions. Take 1 tablet (128mg) by mouth every Monday, Tuesday, Wednesday, Thursday, Friday and Saturday morning before breakfast   Yes [provider]  metoprolol succinate (TOPROL XL) 25 MG 24 hr tablet Take 0.5 tablets (12.5 mg total) by mouth at bedtime. 08/10/22  Yes Wieting, Richard, MD  midodrine (PROAMATINE) 5 MG tablet Take 1 tablet (5 mg total) by mouth 3 (three) times daily with meals. 09/09/22  Yes AJennye Boroughs MD  Multiple Vitamin (MULTIVITAMIN) tablet Take 1 tablet by mouth daily.   Yes [provider]  nitroGLYCERIN (NITROSTAT) 0.4 MG SL tablet Place 0.4 mg under the tongue  every 5 (five) minutes as needed for chest pain.   Yes [provider]  polyvinyl alcohol (LIQUIFILM TEARS) 1.4 % ophthalmic solution Place 1 drop into both eyes as needed for dry eyes. 09/15/22  Yes GNicole KindredA, DO  potassium chloride SA (KLOR-CON M) 20 MEQ tablet Take 1 tablet (20 mEq total) by mouth daily. 09/16/22  Yes GEzekiel Slocumb DO  Rivaroxaban (XARELTO) 20 MG TABS tablet Take 1 tablet (20 mg total) by mouth daily with supper. 09/09/22  Yes AJennye Boroughs MD  rosuvastatin (CRESTOR) 10 MG tablet Take 10 mg by mouth at bedtime.   Yes [provider]  sertraline (ZOLOFT) 100 MG tablet Take 150 mg by mouth daily.   Yes [provider]  sodium chloride (OCEAN) 0.65 % SOLN nasal spray Place 1 spray into both nostrils as needed for congestion. 09/15/22  Yes GNicole KindredA, DO  spironolactone (ALDACTONE) 25 MG tablet Take 1 tablet (25 mg total) by mouth daily. 08/11/22  Yes Wieting, Richard, MD  tiotropium (SPIRIVA) 18 MCG inhalation capsule Place 18 mcg into inhaler and inhale daily.   Yes [provider]   Review of Systems  Constitutional:  Positive for fatigue. Negative for appetite change.  HENT:  Positive for hearing loss. Negative for congestion, sinus pressure and sore throat.   Eyes: Negative.   Respiratory:  Negative for cough, chest tightness, shortness of breath and wheezing.   Cardiovascular:  Negative for chest pain, palpitations and leg swelling.  Gastrointestinal:  Negative for abdominal distention and abdominal pain.  Endocrine: Negative.   Genitourinary: Negative.   Musculoskeletal:  Negative for back pain and neck pain.  Skin: Negative.   Allergic/Immunologic: Negative.   Neurological:  Positive for dizziness. Negative for light-headedness.  Hematological:  Negative for adenopathy. Does not bruise/bleed easily.  Psychiatric/Behavioral:  Negative for dysphoric mood and sleep disturbance (sleeping on 1 pillow with CPAP). The  patient is not nervous/anxious.    Vitals:   09/22/22 1102  BP: (!) 99/52  Pulse: 77  Resp: 16  SpO2: 98%  Weight: 118 lb (53.5 kg)   Wt Readings from Last 3 Encounters:  09/22/22 118 lb (53.5 kg)  09/16/22 131 lb 6.3 oz (59.6 kg)  09/02/22 152 lb 4.8 oz (69.1 kg)   Lab Results  Component Value Date   CREATININE 1.16 09/21/2022   CREATININE 0.98 09/16/2022   CREATININE 0.92 09/15/2022   Physical Exam Vitals and nursing note reviewed.  Constitutional:      Appearance: Normal appearance.  HENT:     Head: Normocephalic and atraumatic.  Cardiovascular:     Rate and Rhythm: Normal rate. Rhythm irregular.  Pulmonary:     Effort: Pulmonary effort is normal. No respiratory distress.     Breath sounds: No wheezing or rales.  Abdominal:     General: There is no distension.  Palpations: Abdomen is soft.     Tenderness: There is no abdominal tenderness.  Musculoskeletal:        General: No tenderness.     Cervical back: Normal range of motion and neck supple.     Right lower leg: No edema.     Left lower leg: No edema.  Skin:    General: Skin is warm and dry.  Neurological:     General: No focal deficit present.     Mental Status: He is alert and oriented to person, place, and time.  Psychiatric:        Mood and Affect: Mood normal.        Behavior: Behavior normal.        Thought Content: Thought content normal.   Assessment & Plan:  1: Chronic heart failure with preserved ejection fraction with structural changes (LAE)- - NYHA class III - euvolemic today - order written to be weighed daily and to call for an overnight weight gain of > 2 pounds or a weekly weight gain of > 5 pounds - 118 pounds today, was 148.4 in 05/2022 - not adding salt  - furosemide '40mg'$  BID - metoprolol succinate 12.'5mg'$  daily - potassium 72mq daily - spironolactone '25mg'$  daily - BP too low for entresto; could consider decreasing furosemide and if BP improves, add entresto - BNP 09/07/22 was  1220.8  2: HTN- - BP 99/52; facility BP shows 94-155/58-93 - saw PCP (Tumey) 09/06/22 - BMP 09/21/22 reviewed and showed sodium 135, potassium 3.9, creatinine 1.16 and GFR >60  3: Atrial fibrillation- - saw cardiology (Ronalee Red 10/25/21 - HR (I) today  4: Sleep apnea- - says that he's wearing his CPAP at bedtime - encouraged to also wear it if he takes a nap during the day  5: 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   Return in 2 months, sooner if needed.

## 2022-10-17 ENCOUNTER — Encounter: Payer: Medicare Other | Admitting: Family

## 2022-10-20 ENCOUNTER — Encounter: Payer: Self-pay | Admitting: Emergency Medicine

## 2022-10-20 ENCOUNTER — Other Ambulatory Visit: Payer: Self-pay

## 2022-10-20 ENCOUNTER — Emergency Department: Payer: Medicare Other

## 2022-10-20 ENCOUNTER — Emergency Department
Admission: EM | Admit: 2022-10-20 | Discharge: 2022-10-21 | Disposition: A | Discharge status: Home / Self Care | Payer: Medicare Other | Attending: Emergency Medicine | Admitting: Emergency Medicine

## 2022-10-20 DIAGNOSIS — W1830XA Fall on same level, unspecified, initial encounter: Secondary | ICD-10-CM | POA: Diagnosis not present

## 2022-10-20 DIAGNOSIS — I509 Heart failure, unspecified: Secondary | ICD-10-CM | POA: Diagnosis not present

## 2022-10-20 DIAGNOSIS — I110 Hypertensive heart disease with heart failure: Secondary | ICD-10-CM | POA: Insufficient documentation

## 2022-10-20 DIAGNOSIS — Z1152 Encounter for screening for COVID-19: Secondary | ICD-10-CM | POA: Insufficient documentation

## 2022-10-20 DIAGNOSIS — W19XXXA Unspecified fall, initial encounter: Secondary | ICD-10-CM

## 2022-10-20 DIAGNOSIS — Z7901 Long term (current) use of anticoagulants: Secondary | ICD-10-CM | POA: Diagnosis not present

## 2022-10-20 DIAGNOSIS — R55 Syncope and collapse: Secondary | ICD-10-CM | POA: Diagnosis present

## 2022-10-20 DIAGNOSIS — F0390 Unspecified dementia without behavioral disturbance: Secondary | ICD-10-CM | POA: Insufficient documentation

## 2022-10-20 DIAGNOSIS — E039 Hypothyroidism, unspecified: Secondary | ICD-10-CM | POA: Diagnosis not present

## 2022-10-20 LAB — URINALYSIS, W/ REFLEX TO CULTURE (INFECTION SUSPECTED)
Bacteria, UA: NONE SEEN
Bilirubin Urine: NEGATIVE
Glucose, UA: NEGATIVE mg/dL
Hgb urine dipstick: NEGATIVE
Ketones, ur: NEGATIVE mg/dL
Leukocytes,Ua: NEGATIVE
Nitrite: NEGATIVE
Protein, ur: NEGATIVE mg/dL
Specific Gravity, Urine: 1.015 (ref 1.005–1.030)
pH: 5 (ref 5.0–8.0)

## 2022-10-20 LAB — COMPREHENSIVE METABOLIC PANEL
ALT: 22 U/L (ref 0–44)
AST: 43 U/L — ABNORMAL HIGH (ref 15–41)
Albumin: 2.6 g/dL — ABNORMAL LOW (ref 3.5–5.0)
Alkaline Phosphatase: 93 U/L (ref 38–126)
Anion gap: 12 (ref 5–15)
BUN: 31 mg/dL — ABNORMAL HIGH (ref 8–23)
CO2: 24 mmol/L (ref 22–32)
Calcium: 7.7 mg/dL — ABNORMAL LOW (ref 8.9–10.3)
Chloride: 97 mmol/L — ABNORMAL LOW (ref 98–111)
Creatinine, Ser: 0.81 mg/dL (ref 0.61–1.24)
GFR, Estimated: >60 mL/min (ref >60)
Glucose, Bld: 89 mg/dL (ref 70–99)
Potassium: 3.3 mmol/L — ABNORMAL LOW (ref 3.5–5.1)
Sodium: 133 mmol/L — ABNORMAL LOW (ref 135–145)
Total Bilirubin: 0.8 mg/dL (ref 0.3–1.2)
Total Protein: 6.9 g/dL (ref 6.5–8.1)

## 2022-10-20 LAB — CBC WITH DIFFERENTIAL/PLATELET
Abs Immature Granulocytes: 0.03 10*3/uL (ref 0.00–0.07)
Basophils Absolute: 0 10*3/uL (ref 0.0–0.1)
Basophils Relative: 0 %
Eosinophils Absolute: 0 10*3/uL (ref 0.0–0.5)
Eosinophils Relative: 0 %
HCT: 24.8 % — ABNORMAL LOW (ref 39.0–52.0)
Hemoglobin: 7.4 g/dL — ABNORMAL LOW (ref 13.0–17.0)
Immature Granulocytes: 1 %
Lymphocytes Relative: 7 %
Lymphs Abs: 0.4 10*3/uL — ABNORMAL LOW (ref 0.7–4.0)
MCH: 28.7 pg (ref 26.0–34.0)
MCHC: 29.8 g/dL — ABNORMAL LOW (ref 30.0–36.0)
MCV: 96.1 fL (ref 80.0–100.0)
Monocytes Absolute: 0.3 10*3/uL (ref 0.1–1.0)
Monocytes Relative: 6 %
Neutro Abs: 4.8 10*3/uL (ref 1.7–7.7)
Neutrophils Relative %: 86 %
Platelets: 141 10*3/uL — ABNORMAL LOW (ref 150–400)
RBC: 2.58 MIL/uL — ABNORMAL LOW (ref 4.22–5.81)
RDW: 18.6 % — ABNORMAL HIGH (ref 11.5–15.5)
WBC: 5.5 10*3/uL (ref 4.0–10.5)
nRBC: 0 % (ref 0.0–0.2)

## 2022-10-20 LAB — SARS CORONAVIRUS 2 BY RT PCR: SARS Coronavirus 2 by RT PCR: NEGATIVE

## 2022-10-20 NOTE — ED Notes (Signed)
Called [facility] for transport to facility  

## 2022-10-20 NOTE — ED Triage Notes (Signed)
Pt ems from [facility] s/p fall. Per ems, pt slipped and fell when getting out of bed. Pt denies any pain. Hx dementia, afib, chronic O2 2LNC.Marland Kitchen

## 2022-10-20 NOTE — ED Notes (Signed)
Per [facility], [facility] will transport pt

## 2022-10-20 NOTE — ED Provider Notes (Signed)
[facility] Provider Note   Event Date/Time   First MD Initiated Contact with Patient 10/20/22 [phone]     (approximate) History  Fall  HPI DUEL GUDERIAN is a 82 y.o. male with a past medical history of chronic atrial fibrillation, hypertension, hypothyroidism, CHF, and dementia who presents from long-term care facility via EMS after a mechanical fall in which patient slipped and fell getting out of bed.  Patient denies any pain at this time however per EMS, family encouraged patient to come to emergency department to get checked out. ROS: Patient currently denies any vision changes, tinnitus, difficulty speaking, facial droop, sore throat, chest pain, shortness of breath, abdominal pain, nausea/vomiting/diarrhea, dysuria, or weakness/numbness/paresthesias in any extremity   Physical Exam  Triage Vital Signs: ED Triage Vitals  Enc Vitals Group     BP      Pulse      Resp      Temp      Temp src      SpO2      Weight      Height      Head Circumference      Peak Flow      Pain Score      Pain Loc      Pain Edu?      Excl. in [place]?    Most recent vital signs: Vitals:   10/20/22 0955  Temp: 98.3 F (36.8 C)  SpO2: 100%   General: Awake, oriented to self CV:  Good peripheral perfusion.  Resp:  Normal effort.  Abd:  No distention.  Other:  Elderly cachectic Caucasian male laying in bed in no acute distress.  IV to left [facility] ED Results / Procedures / Treatments  Labs (all labs ordered are listed, but only abnormal results are displayed) Labs Reviewed - No data to display EKG ED ECG REPORT I, Naaman Plummer, the attending physician, personally viewed and interpreted this ECG. Date: 10/20/2022 EKG Time: 0956 Rate: 143 Rhythm: normal sinus rhythm QRS Axis: normal Intervals: normal ST/T Wave abnormalities: normal Narrative Interpretation: no evidence of acute ischemia RADIOLOGY ED MD interpretation: CT of the head without contrast interpreted by me  shows no evidence of acute abnormalities including no intracerebral hemorrhage, obvious masses, or significant edema  CT of the cervical spine interpreted by me does not show any evidence of acute abnormalities including no acute fracture, malalignment, height loss, or dislocation  X-ray of the pelvis shows no evidence of acute abnormalities.  This imaging was all interpreted by me -Agree with radiology assessment Official radiology report(s): CT Cervical Spine Wo Contrast  Result Date: 10/20/2022 CLINICAL DATA:  Trauma, fall EXAM: CT CERVICAL SPINE WITHOUT CONTRAST TECHNIQUE: Multidetector CT imaging of the cervical spine was performed without intravenous contrast. Multiplanar CT image reconstructions were also generated. RADIATION DOSE REDUCTION: This exam was performed according to the departmental dose-optimization program which includes automated exposure control, adjustment of the mA and/or kV according to patient size and/or use of iterative reconstruction technique. COMPARISON:  09/21/2022 FINDINGS: Alignment: Alignment of posterior margins of vertebral bodies is within normal limits. Skull base and vertebrae: No recent fracture is seen. There are smoothly marginated calcifications adjacent to the tip of spinous process of C7 vertebra with no interval change suggesting old fracture or soft tissue calcification. There is small soft tissue calcification posterior to C4 vertebra suggesting ligament calcification from previous injury. Soft tissues and spinal canal: There is extrinsic pressure over the ventral margin of  thecal sac caused by bony spurs at multiple levels, more so at C4-C5 level with spinal stenosis. Disc levels: There is encroachment of neural foramina by bony spurs from C2 to C7 levels. There is previous anterior surgical fusion at C6-C7 level. Upper chest: No acute findings are seen. Other: None. IMPRESSION: No recent fracture is seen in cervical spine. Previous surgical fusion at C6-C7  level. 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 Electronically Signed   By: Elmer Picker M.D.   On: 10/20/2022 10:44   CT Head Wo Contrast  Result Date: 10/20/2022 CLINICAL DATA:  Trauma, fall EXAM: CT HEAD WITHOUT CONTRAST TECHNIQUE: Contiguous axial images were obtained from the base of the skull through the vertex without intravenous contrast. RADIATION DOSE REDUCTION: This exam was performed according to the departmental dose-optimization program which includes automated exposure control, adjustment of the mA and/or kV according to patient size and/or use of iterative reconstruction technique. COMPARISON:  09/21/2022 FINDINGS: Brain: No acute intracranial findings are seen. There are no signs of bleeding within the cranium. There is prominence of the third and both lateral ventricles. Cortical sulci are prominent. There is decreased density in periventricular white matter. Vascular: Unremarkable. Skull: No acute findings are seen. Sinuses/Orbits: Unremarkable. Other: No significant interval changes are noted. IMPRESSION: No acute intracranial findings are seen. Atrophy. Small-vessel disease. Electronically Signed   By: Elmer Picker M.D.   On: 10/20/2022 10:40   DG Pelvis Portable  Result Date: 10/20/2022 CLINICAL DATA:  Fall. EXAM: PORTABLE PELVIS 1-2 VIEWS COMPARISON:  September 21, 2022. FINDINGS: There is no evidence of pelvic fracture or diastasis. No pelvic bone lesions are seen. IMPRESSION: Negative. Electronically Signed   By: Marijo Conception M.D.   On: 10/20/2022 10:33   PROCEDURES: Critical Care performed: No Procedures MEDICATIONS ORDERED IN ED: Medications - No data to display IMPRESSION / MDM / [place] / ED COURSE  I reviewed the triage vital signs and the nursing notes.                             The patient is on the cardiac monitor to evaluate for evidence of arrhythmia and/or  significant heart rate changes. Patient's presentation is most consistent with acute presentation with potential threat to life or bodily function. Presenting after a fall that occurred just prior to arrival. The mechanism of injury was a mechanical ground level fall without syncope or near-syncope. The current level of pain is moderate. There was no loss of consciousness, confusion, seizure, or memory impairment. There is not a laceration associated with the injury. Denies neck pain. The patient does take blood thinner medications.  On Xarelto Denies vomiting, numbness/weakness, fever  Dispo: Discharge with PCP follow-up       FINAL CLINICAL IMPRESSION(S) / ED DIAGNOSES   Final diagnoses:  Fall, initial encounter   Rx / DC Orders   ED Discharge Orders     None      Note:  This document was prepared using Dragon voice recognition software and may include unintentional dictation errors.   Naaman Plummer, MD 10/20/22 [phone]

## 2022-10-20 NOTE — ED Notes (Signed)
Upon EMS arrival 24 G IV present in L AC. IV removed due to catheter being out of place.

## 2022-10-21 NOTE — ED Notes (Signed)
Called for update on transport. Still on the list w/ [facility] to [facility]

## 2022-10-22 ENCOUNTER — Encounter (HOSPITAL_COMMUNITY): Payer: Self-pay

## 2022-10-22 ENCOUNTER — Emergency Department (HOSPITAL_COMMUNITY)
Admission: EM | Admit: 2022-10-22 | Discharge: 2022-10-22 | Disposition: A | Discharge status: Skilled Nursing Facility | Payer: Medicare Other | Attending: Emergency Medicine | Admitting: Emergency Medicine

## 2022-10-22 ENCOUNTER — Emergency Department (HOSPITAL_COMMUNITY): Payer: Medicare Other

## 2022-10-22 DIAGNOSIS — E039 Hypothyroidism, unspecified: Secondary | ICD-10-CM | POA: Diagnosis not present

## 2022-10-22 DIAGNOSIS — W010XXA Fall on same level from slipping, tripping and stumbling without subsequent striking against object, initial encounter: Secondary | ICD-10-CM | POA: Insufficient documentation

## 2022-10-22 DIAGNOSIS — S0990XA Unspecified injury of head, initial encounter: Secondary | ICD-10-CM

## 2022-10-22 DIAGNOSIS — Z8505 Personal history of malignant neoplasm of liver: Secondary | ICD-10-CM | POA: Diagnosis not present

## 2022-10-22 DIAGNOSIS — S0001XA Abrasion of scalp, initial encounter: Secondary | ICD-10-CM

## 2022-10-22 DIAGNOSIS — J90 Pleural effusion, not elsewhere classified: Secondary | ICD-10-CM | POA: Diagnosis not present

## 2022-10-22 DIAGNOSIS — Z85118 Personal history of other malignant neoplasm of bronchus and lung: Secondary | ICD-10-CM | POA: Diagnosis not present

## 2022-10-22 DIAGNOSIS — I509 Heart failure, unspecified: Secondary | ICD-10-CM | POA: Diagnosis not present

## 2022-10-22 DIAGNOSIS — W19XXXA Unspecified fall, initial encounter: Secondary | ICD-10-CM

## 2022-10-22 DIAGNOSIS — I110 Hypertensive heart disease with heart failure: Secondary | ICD-10-CM | POA: Insufficient documentation

## 2022-10-22 NOTE — ED Notes (Signed)
[place] notified by RN of patient's discharge back to facility . [facility] notified by Network engineer .

## 2022-10-22 NOTE — Progress Notes (Signed)
   10/22/22 1841  Spiritual Encounters  Type of Visit Initial;Attempt (pt unavailable)  Care provided to: Pt not available  Conversation partners present during encounter Nurse  Referral source Trauma page  Reason for visit Trauma  OnCall Visit Yes   Chaplain responded to a trauma in the ED - fall on blood thinners. Per EMS, patient's wife was in the room at the facility with the patient and is aware of what's happening, his location, etc. No needs at this time. Spiritual care services available as needed.   Jeri Lager, Chaplain 10/22/22

## 2022-10-22 NOTE — ED Provider Notes (Signed)
Emergency Department Provider Note   I have reviewed the triage vital signs and the nursing notes.   HISTORY  Chief Complaint level 2 fall on thinners, Fall, and Level 2   HPI Louis Sanchez is a 82 y.o. male is emergency department evaluation after fall.  He arrives a level 2 trauma as he is on Xarelto.  Patient is at [facility] where he lives with his wife.  He tells me he was walking around her bed when he slipped and fell backward striking the back of his head.  He denies any presyncope symptoms prior to fall.  Typically no palpitations or chest pain.  No abdominal or back pain.  No pain in the extremities.  He has a mild headache.  EMS report mild bleeding from the crown of the head but no deep laceration. No change in mental status. Patient denies any LOC. Fall was witnessed by wife.   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 Respiratory: Denies shortness of breath. Gastrointestinal: No abdominal pain.  No nausea, no vomiting.  Neurological: Positive HA.   ____________________________________________   PHYSICAL EXAM:  VITAL SIGNS: Vitals:   10/22/22 1945 10/22/22 2000  BP: 106/72 114/66  Pulse: 79 77  Resp:  19  Temp:    SpO2: 97% 95%    Constitutional: Alert and oriented. Well appearing and  in no acute distress. Eyes: Conjunctivae are normal.  Head: Superficial abrasion to the crown of the head and a second area to the occipital scalp. No laceration or large hematoma.  Nose: No congestion/rhinnorhea. Mouth/Throat: Mucous membranes are moist.   Neck: No stridor.  No cervical spine tenderness to palpation. Cardiovascular: Normal rate, regular rhythm. Good peripheral circulation. Grossly normal heart sounds.   Respiratory: Normal respiratory effort.  No retractions. Lungs CTAB. Gastrointestinal: Soft and nontender. No distention.  Musculoskeletal: No lower extremity tenderness nor edema. No gross deformities of extremities. Moves the bilateral upper and lower extremities without difficulty or reduced ROM.  Neurologic:  Normal speech and language. No gross focal neurologic deficits are appreciated.  Skin:  Skin is warm and dry. Scalp abrasions as above.  ___________________________________  RADIOLOGY  CT Head Wo Contrast  Result Date: 10/22/2022 CLINICAL DATA:  Trauma EXAM: CT HEAD WITHOUT CONTRAST CT CERVICAL SPINE WITHOUT CONTRAST TECHNIQUE: Multidetector CT imaging of the head and cervical spine was performed following the standard protocol without intravenous contrast. Multiplanar CT image reconstructions of the cervical spine were also generated. RADIATION DOSE REDUCTION: This exam was performed according to the departmental dose-optimization program which includes automated exposure control, adjustment of the mA and/or kV according to patient size and/or use of iterative reconstruction technique. COMPARISON:  CT head and C Spine 10/20/22 FINDINGS: CT HEAD FINDINGS Brain: Sequela  of moderate chronic microvascular ischemic change. No CT evidence of an acute infarct. Extra-axial fluid collection. No hydrocephalus. Vascular: No hyperdense vessel or unexpected calcification. Skull: Soft tissue swelling along the right parietal scalp (series 4, image 75). Sinuses/Orbits: No middle ear or  mastoid effusion. Paranasal sinuses are clear. Right lens replacement. Orbits are otherwise unremarkable. Other: None. CT CERVICAL SPINE FINDINGS Alignment: Trace retrolisthesis of C4 on C5 and C5 on C6. Skull base and vertebrae: No acute fracture. No primary bone lesion or focal pathologic process. Status post C6-C7 ACDF. Soft tissues and spinal canal: No prevertebral fluid or swelling. No visible canal hematoma. 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 Upper chest: Negative. Other: None IMPRESSION: 1. Soft tissue injury over the right parietal scalp without evidence of underlying calvarial fracture or intracranial injury. 2. No acute fracture or traumatic listhesis in the cervical spine. 3. 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 Electronically Signed   By: Marin Roberts M.D.   On: 10/22/2022 19:35   CT Cervical Spine Wo Contrast  Result Date: 10/22/2022 CLINICAL DATA:  Trauma EXAM: CT HEAD WITHOUT CONTRAST CT CERVICAL SPINE WITHOUT CONTRAST TECHNIQUE: Multidetector CT imaging of the head and cervical spine was performed following the standard protocol without intravenous contrast. Multiplanar CT image reconstructions of the cervical spine were also generated. RADIATION DOSE REDUCTION: This exam was performed according to the departmental dose-optimization program which includes automated exposure control, adjustment of the mA and/or kV according to patient size and/or use of iterative reconstruction technique. COMPARISON:  CT head and C Spine 10/20/22 FINDINGS: CT HEAD FINDINGS Brain: Sequela of moderate chronic microvascular ischemic change. No CT evidence of an acute infarct. Extra-axial fluid collection. No hydrocephalus. Vascular: No hyperdense vessel or unexpected calcification. Skull: Soft tissue swelling along the right parietal scalp (series 4, image 75). Sinuses/Orbits: No middle ear or mastoid effusion. Paranasal sinuses are clear. Right  lens replacement. Orbits are otherwise unremarkable. Other: None. CT CERVICAL SPINE FINDINGS Alignment: Trace retrolisthesis of C4 on C5 and C5 on C6. Skull base and vertebrae: No acute fracture. No primary bone lesion or focal pathologic process. Status post C6-C7 ACDF. Soft tissues and spinal canal: No prevertebral fluid or swelling. No visible canal hematoma. 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 Upper chest: Negative. Other: None IMPRESSION: 1. Soft tissue injury over the right parietal scalp without evidence of underlying calvarial fracture or intracranial injury. 2. No acute fracture or traumatic listhesis in the cervical spine. 3. 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 Electronically Signed   By: Marin Roberts M.D.   On: 10/22/2022 19:35   DG Pelvis Portable  Result Date: 10/22/2022 CLINICAL DATA:  Fall EXAM: PORTABLE PELVIS 1-2 VIEWS COMPARISON:  10/20/2022 FINDINGS: There is no evidence of pelvic fracture or diastasis. No pelvic bone lesions are seen. Vascular calcifications noted. IMPRESSION: Negative. Electronically Signed   By: Fidela Salisbury M.D.   On: 10/22/2022 19:00   DG Chest Portable 1 View  Result Date: 10/22/2022 CLINICAL DATA:  Fall EXAM: PORTABLE CHEST 1 VIEW COMPARISON:  CXR 10/20/22 FINDINGS: Persistent moderate right-sided pleural effusion with a superimposed airspace opacity, which could represent atelectasis or infection. Unchanged enlarged cardiac contours. Pneumothorax. No radiographically apparent new displaced rib fractures. IMPRESSION: Persistent moderate right-sided pleural effusion with a superimposed airspace opacity, which could represent atelectasis or infection. Electronically Signed   By: Marin Roberts M.D.   On: 10/22/2022 18:59  ____________________________________________   PROCEDURES  Procedure(s) performed:   Procedures  CRITICAL CARE Performed by: Margette Fast Total critical care  time: 35 minutes Critical care time was exclusive of separately billable procedures and treating other patients. Critical care was necessary to treat or prevent imminent or life-threatening deterioration. Critical care was time spent personally by me on the following activities: development of treatment plan with patient and/or surrogate as well as nursing, discussions with consultants, evaluation of patient's response to treatment, examination of patient, obtaining history from patient or surrogate, ordering and performing treatments and interventions, ordering and review of laboratory studies, ordering and review of radiographic studies, pulse oximetry and re-evaluation of patient's condition.  Nanda Quinton, MD Emergency Medicine  ____________________________________________   INITIAL IMPRESSION / ASSESSMENT AND PLAN / ED COURSE  Pertinent labs & imaging results that were available during my care of the patient were reviewed by me and considered in my medical decision making (see chart for details).   This patient is Presenting for Evaluation of trauma, which does require a range of treatment options, and is a complaint that involves a high risk of morbidity and mortality.  The Differential Diagnoses includes subdural hematoma, epidural hematoma, acute concussion, traumatic subarachnoid hemorrhage, cerebral contusions, etc.   I did obtain Additional Historical Information from EMS.  Radiologic Tests Ordered, included CT head and c spine along with CXR and pelvis XR. I independently interpreted the images and agree with radiology interpretation.   Cardiac Monitor Tracing which shows NSR.    Social Determinants of Health Risk patient is a non-smoker.   Medical Decision Making: Summary:  Presents to the emergency department with headache after mechanical fall.  He looks well overall.  Abrasions to the scalp without need for laceration repair.  Hold on labs or syncope workup at this time.    Reevaluation with update and discussion with patient. CXR with known effusion. No PNA symptoms, hypoxemia, SOB to suspect opacity is related to infection. Favor atelectasis. Appears stable for discharge.   Considered admission but patient with mechanical fall.  No focal neurodeficits or altered mental status to prompt further workup. Patient stable for discharge.   Patient's presentation is most consistent with acute presentation with potential threat to life or bodily function.   Disposition: discharge  ____________________________________________  FINAL CLINICAL IMPRESSION(S) / ED DIAGNOSES  Final diagnoses:  Fall, initial encounter  Injury of head, initial encounter  Abrasion of scalp, initial encounter  Pleural effusion    Note:  This document was prepared using Dragon voice recognition software and may include unintentional dictation errors.  Nanda Quinton, MD, [facility] Emergency Medicine    Ayaat Jansma, [facility], MD 10/22/22 2029

## 2022-10-22 NOTE — Discharge Instructions (Addendum)
You were seen in the emergency today after a fall and head injury.  I do not see any broken bones or bleeding.  You do have some skin tears to your shoulder and scalp.  Keep these clean with warm water and soap. Follow closely with your primary care doctors.

## 2022-10-22 NOTE — Progress Notes (Signed)
Orthopedic Tech Progress Note Patient Details:  Louis Sanchez [birth year] [id number]  Level II trauma, ortho tech not required per Dr. Laverta Baltimore.  Patient ID: Louis Sanchez, male   DOB: 1941-05-24, 82 y.o.   MRN: [id number]  [name] 10/22/2022, 6:35 PM

## 2022-10-22 NOTE — ED Notes (Signed)
Pt came in from [facility] after sustaining a fall witnessed by wife while in room. Pt was making wife's bed when he tripped and fell landing on the floor. Pt is on Xarelto for afib. Fall was unwitnessed by staff. No LOC. Pt is alert and oriented x 2 which is pt baseline. Denies pain. EMS placed a c-collar for safety. Dried blood noted to top of head with no active bleeding. Pt has a skin tear to left elbow and skin tear to back of right shoulder. Cardiac monitoring in place. Will continue to monitor.

## 2022-10-22 NOTE — ED Notes (Signed)
Patient transported to CT scan . 

## 2022-10-22 NOTE — ED Triage Notes (Signed)
Lives in [place]. Unwitnessed fall by staff. Pt was with wife in room when pt slipped and fell while making wife's bed. No LOC. On Xarelto for afib. Alert and oriented x 2 and at baseline.

## 2022-10-22 NOTE — ED Notes (Signed)
Trauma Event Note    TRN to bedside to round on lvl 2 fall on thinners. Pending D/C  Louis Sanchez O Louis Sanchez  Trauma Response RN  Please call TRN at [phone] for further assistance.

## 2022-11-17 ENCOUNTER — Encounter: Payer: Self-pay | Admitting: Family

## 2022-11-17 ENCOUNTER — Ambulatory Visit: Payer: Medicare Other | Attending: Family | Admitting: Family

## 2022-11-17 VITALS — BP 116/60 | HR 89 | Wt 120.0 lb

## 2022-11-17 DIAGNOSIS — G4733 Obstructive sleep apnea (adult) (pediatric): Secondary | ICD-10-CM

## 2022-11-17 DIAGNOSIS — G4730 Sleep apnea, unspecified: Secondary | ICD-10-CM | POA: Insufficient documentation

## 2022-11-17 DIAGNOSIS — I5032 Chronic diastolic (congestive) heart failure: Secondary | ICD-10-CM | POA: Diagnosis present

## 2022-11-17 DIAGNOSIS — I10 Essential (primary) hypertension: Secondary | ICD-10-CM

## 2022-11-17 DIAGNOSIS — Z87891 Personal history of nicotine dependence: Secondary | ICD-10-CM | POA: Diagnosis not present

## 2022-11-17 DIAGNOSIS — I110 Hypertensive heart disease with heart failure: Secondary | ICD-10-CM | POA: Insufficient documentation

## 2022-11-17 DIAGNOSIS — I4891 Unspecified atrial fibrillation: Secondary | ICD-10-CM | POA: Insufficient documentation

## 2022-11-17 DIAGNOSIS — Z79899 Other long term (current) drug therapy: Secondary | ICD-10-CM | POA: Diagnosis not present

## 2022-11-17 DIAGNOSIS — E039 Hypothyroidism, unspecified: Secondary | ICD-10-CM | POA: Insufficient documentation

## 2022-11-17 DIAGNOSIS — C787 Secondary malignant neoplasm of liver and intrahepatic bile duct: Secondary | ICD-10-CM | POA: Insufficient documentation

## 2022-11-17 DIAGNOSIS — Z79890 Hormone replacement therapy (postmenopausal): Secondary | ICD-10-CM | POA: Diagnosis not present

## 2022-11-17 DIAGNOSIS — C7A8 Other malignant neuroendocrine tumors: Secondary | ICD-10-CM | POA: Insufficient documentation

## 2022-11-17 DIAGNOSIS — Z8673 Personal history of transient ischemic attack (TIA), and cerebral infarction without residual deficits: Secondary | ICD-10-CM | POA: Insufficient documentation

## 2022-11-17 DIAGNOSIS — I482 Chronic atrial fibrillation, unspecified: Secondary | ICD-10-CM

## 2022-11-17 DIAGNOSIS — Z9981 Dependence on supplemental oxygen: Secondary | ICD-10-CM | POA: Diagnosis not present

## 2022-11-17 DIAGNOSIS — F419 Anxiety disorder, unspecified: Secondary | ICD-10-CM | POA: Diagnosis not present

## 2022-11-17 DIAGNOSIS — I428 Other cardiomyopathies: Secondary | ICD-10-CM | POA: Diagnosis not present

## 2022-11-17 DIAGNOSIS — E785 Hyperlipidemia, unspecified: Secondary | ICD-10-CM | POA: Diagnosis not present

## 2022-11-17 DIAGNOSIS — C3431 Malignant neoplasm of lower lobe, right bronchus or lung: Secondary | ICD-10-CM

## 2022-11-17 NOTE — Patient Instructions (Signed)
Call us in the future if you need [place] for anything.   It was good to see you today!

## 2022-11-17 NOTE — Progress Notes (Signed)
Patient ID: JIHAAD BRUSCHI, male    DOB: August 31, 1940, 82 y.o.   MRN: [id number]  Primary cardiologist: Laurie Panda, NP (last seen 04/23) PCP: Lynnea Ferrier, MD (last seen 02/24)  HPI  Mr Gautreau is a 82 y/o male with a history of atrial fibrillation, hyperlipidemia, TIA, anxiety, GERD, HTN, thyroid disease, sleep apnea, metastatic neuroendocrine tumor, previous tobacco use and chronic heart failure.   Echo 04/11/22: EF of 60-65% along with mild LAE and moderate MR/TR.   Was in the ED 10/22/22 due to a fall where he hit the back of his head. Was in the ED 10/20/22 due to a mechanical fall when getting out of bed. Xrays negative for fractures. Was in the ED 09/21/22 due to mechanical fall resulting in ear laceration. Admitted twice in February due to aspiration pneumonia and pleural effusions. Admitted 08/07/22 due to SOB/ edema due to a/c heart failure. Diuresed.  He presents today for a HF follow-up visit with a chief complaint of moderate fatigue with little exertion. Chronic in nature. Has SOB & dizziness along with this. Denies difficulty sleeping, abdominal distention, palpitations, pedal edema, chest pain, wheezing, cough or weight gain.   Currently at [facility]. Wearing oxygen at 3L around the clock although does occasionally forget to put it on. Has a walker but doesn't always use it.   Has had 2 recent falls and he says that he doesn't really remember what happens but that he just "wakes up on the ground". Per facility [facility] he is no longer taking xarelto.   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 [facility])     Other and unspecified hyperlipidemia 1997   Other specified disorder of male genital organs(608.89) 08/2012   Sleep disturbance, unspecified    Thyroid disease    Unspecified hypothyroidism 2005   Past Surgical History:  Procedure Laterality Date   APPENDECTOMY  1953   colon polyps  2009   HERNIA REPAIR  1957   Right   INGUINAL HERNIA REPAIR Right 2014   SPINE SURGERY  1997   neck   WRIST SURGERY     Family History  Problem Relation Age of Onset   Alzheimer's disease Mother    Social History   Tobacco Use   Smoking status: Former    Years: 25    Types: Cigarettes   Smokeless tobacco: Never  Substance Use Topics   Alcohol use: Not Currently    Alcohol/week: 15.0 standard drinks of alcohol    Types: 15 Standard drinks or equivalent per week   Allergies  Allergen Reactions   Atorvastatin     Other reaction(s): Muscle Pain Other reaction(s): Muscle Pain Other reaction(s): Muscle Pain Other reaction(s): Muscle Pain Other reaction(s): Muscle Pain    Levofloxacin Rash   Prior to Admission medications   Medication Sig Start Date End Date Taking? Authorizing Provider  acetaminophen (TYLENOL) 325 MG tablet Take 650 mg by mouth every 6 (six) hours as needed for moderate pain.   Yes [provider]  ferrous  sulfate 325 (65 FE) MG tablet Take 325 mg by mouth at bedtime.   Yes [provider]  furosemide (LASIX) 40 MG tablet Take 1 tablet (40 mg total) by mouth 2 (two) times daily. 08/10/22  Yes Wieting, Richard, MD  gabapentin (NEURONTIN) 300 MG capsule Take 1 capsule (300 mg total) by mouth 3 (three) times daily. 08/10/22  Yes Wieting, Richard, MD  levothyroxine (SYNTHROID) 175 MCG tablet Take 175 mcg by mouth See admin instructions. Take 1 tablet ( ) by mouth every Monday, Tuesday, Wednesday, Thursday, Friday and Saturday morning before breakfast   Yes [provider]  metoprolol succinate (TOPROL XL) 25 MG 24 hr tablet Take 0.5 tablets (12.5  mg total) by mouth at bedtime. 08/10/22  Yes Wieting, Richard, MD  midodrine (PROAMATINE) 5 MG tablet Take 1 tablet (5 mg total) by mouth 3 (three) times daily with meals. 09/09/22  Yes Lurene Shadow, MD  Multiple Vitamin (MULTIVITAMIN) tablet Take 1 tablet by mouth daily.   Yes [provider]  polyvinyl alcohol (LIQUIFILM TEARS) 1.4 % ophthalmic solution Place 1 drop into both eyes as needed for dry eyes. 09/15/22  Yes Esaw Grandchild A, DO  potassium chloride SA (KLOR-CON M) 20 MEQ tablet Take 1 tablet (20 mEq total) by mouth daily. 09/16/22  Yes Pennie Banter, DO  promethazine (PHENERGAN) 25 MG tablet Take 25 mg by mouth every 6 (six) hours as needed for nausea or vomiting.   Yes [provider]  rosuvastatin (CRESTOR) 10 MG tablet Take 10 mg by mouth at bedtime.   Yes [provider]  sertraline (ZOLOFT) 100 MG tablet Take 150 mg by mouth daily.   Yes [provider]  sodium chloride (OCEAN) 0.65 % SOLN nasal spray Place 1 spray into both nostrils as needed for congestion. 09/15/22  Yes Esaw Grandchild A, DO  spironolactone (ALDACTONE) 25 MG tablet Take 1 tablet (25 mg total) by mouth daily. 08/11/22  Yes Wieting, Richard, MD  tiotropium (SPIRIVA) 18 MCG inhalation capsule Place 18 mcg into inhaler and inhale daily.   Yes [provider]  nitroGLYCERIN (NITROSTAT) 0.4 MG SL tablet Place 0.4 mg under the tongue every 5 (five) minutes as needed for chest pain.    [provider]  Rivaroxaban (XARELTO) 20 MG TABS tablet Take 1 tablet (20 mg total) by mouth daily with supper. Patient not taking: Reported on 11/17/2022 09/09/22   Lurene Shadow, MD    Review of Systems  Constitutional:  Positive for fatigue. Negative for appetite change.  HENT:  Positive for hearing loss. Negative for congestion, sinus pressure and sore throat.   Eyes: Negative.   Respiratory:  Positive for shortness of breath. Negative for cough, chest tightness and wheezing.    Cardiovascular:  Negative for chest pain, palpitations and leg swelling.  Gastrointestinal:  Negative for abdominal distention and abdominal pain.  Endocrine: Negative.   Genitourinary: Negative.   Musculoskeletal:  Negative for back pain and neck pain.  Skin: Negative.   Allergic/Immunologic: Negative.   Neurological:  Positive for dizziness. Negative for light-headedness.  Hematological:  Negative for adenopathy. Does not bruise/bleed easily.  Psychiatric/Behavioral:  Negative for dysphoric mood and sleep disturbance (sleeping on 1 pillow with CPAP). The patient is not nervous/anxious.    Vitals:   11/17/22 1334  BP: 116/60  Pulse: 89  SpO2: 100%  Weight: 120 lb (54.4 kg)   Wt Readings from Last 3 Encounters:  11/17/22 120 lb (54.4 kg)  10/22/22 127 lb 13.9 oz (58  kg)  10/20/22 127 lb 10.3 oz (57.9 kg)   Lab Results  Component Value Date   CREATININE 0.81 10/20/2022   CREATININE 1.16 09/21/2022   CREATININE 0.98 09/16/2022   Physical Exam Vitals and nursing note reviewed. Exam conducted with a chaperone present (daughter & staff member).  Constitutional:      Appearance: Normal appearance.  HENT:     Head: Normocephalic and atraumatic.  Cardiovascular:     Rate and Rhythm: Normal rate. Rhythm irregular.  Pulmonary:     Effort: Pulmonary effort is normal. No respiratory distress.     Breath sounds: No wheezing or rales.  Abdominal:     General: There is no distension.     Palpations: Abdomen is soft.     Tenderness: There is no abdominal tenderness.  Musculoskeletal:        General: No tenderness.     Cervical back: Normal range of motion and neck supple.     Right lower leg: No edema.     Left lower leg: No edema.  Skin:    General: Skin is warm and dry.     Findings: Bruising present.     Comments: Right AC bandaged  Neurological:     General: No focal deficit present.     Mental Status: He is alert and oriented to person, place, and time.  Psychiatric:         Mood and Affect: Mood normal.        Behavior: Behavior normal.        Thought Content: Thought content normal.   Assessment & Plan:  1: NICM with preserved ejection fraction- - NYHA class III - euvolemic today - weighing daily; reminded to call for an overnight weight gain of > 2 pounds or a weekly weight gain of > 5 pounds - weight up 2 pounds from last visit 2 months ago - Echo 04/11/22: EF of 60-65% along with mild LAE and moderate MR/TR.  - not adding salt  - continue furosemide  BID - continue metoprolol succinate 12.5mg  daily - continue potassium daily - continue spironolactone  daily - wearing oxygen at 3L around the clock - BNP 09/07/22 was 1220.8  2: HTN- - BP 116/60 - saw PCP Mel Almond) 02/24; now seeing PCP at facility - [facility] 10/20/22 reviewed and showed sodium 133, potassium 3.3, creatinine 0.81 and GFR >60  3: Atrial fibrillation- - saw cardiology Renaldo Reel) 04/23 - HR (I) today  4: Sleep apnea- - says that he's wearing his CPAP at bedtime  - encouraged to also wear it if he takes a nap during the day - also wearing oxygen at 3L around the clock  5: 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 Advised his daughter that she could call at any point to make another appointment and they were comfortable with this plan.

## 2022-11-25 ENCOUNTER — Other Ambulatory Visit: Payer: Self-pay

## 2022-11-25 ENCOUNTER — Emergency Department (HOSPITAL_COMMUNITY): Payer: Medicare Other

## 2022-11-25 ENCOUNTER — Emergency Department (HOSPITAL_COMMUNITY)
Admission: EM | Admit: 2022-11-25 | Discharge: 2022-11-25 | Disposition: A | Discharge status: Skilled Nursing Facility | Payer: Medicare Other | Attending: Emergency Medicine | Admitting: Emergency Medicine

## 2022-11-25 DIAGNOSIS — W19XXXA Unspecified fall, initial encounter: Secondary | ICD-10-CM

## 2022-11-25 DIAGNOSIS — S0101XA Laceration without foreign body of scalp, initial encounter: Secondary | ICD-10-CM | POA: Diagnosis present

## 2022-11-25 DIAGNOSIS — D649 Anemia, unspecified: Secondary | ICD-10-CM | POA: Diagnosis not present

## 2022-11-25 DIAGNOSIS — Z7901 Long term (current) use of anticoagulants: Secondary | ICD-10-CM | POA: Insufficient documentation

## 2022-11-25 DIAGNOSIS — W06XXXA Fall from bed, initial encounter: Secondary | ICD-10-CM | POA: Insufficient documentation

## 2022-11-25 LAB — CBC WITH DIFFERENTIAL/PLATELET
Abs Immature Granulocytes: 0.05 10*3/uL (ref 0.00–0.07)
Basophils Absolute: 0 10*3/uL (ref 0.0–0.1)
Basophils Relative: 1 %
Eosinophils Absolute: 0.1 10*3/uL (ref 0.0–0.5)
Eosinophils Relative: 1 %
HCT: 28 % — ABNORMAL LOW (ref 39.0–52.0)
Hemoglobin: 8.5 g/dL — ABNORMAL LOW (ref 13.0–17.0)
Immature Granulocytes: 1 %
Lymphocytes Relative: 6 %
Lymphs Abs: 0.5 10*3/uL — ABNORMAL LOW (ref 0.7–4.0)
MCH: 27.1 pg (ref 26.0–34.0)
MCHC: 30.4 g/dL (ref 30.0–36.0)
MCV: 89.2 fL (ref 80.0–100.0)
Monocytes Absolute: 0.9 10*3/uL (ref 0.1–1.0)
Monocytes Relative: 11 %
Neutro Abs: 6.6 10*3/uL (ref 1.7–7.7)
Neutrophils Relative %: 80 %
Platelets: 311 10*3/uL (ref 150–400)
RBC: 3.14 MIL/uL — ABNORMAL LOW (ref 4.22–5.81)
RDW: 18 % — ABNORMAL HIGH (ref 11.5–15.5)
WBC: 8.2 10*3/uL (ref 4.0–10.5)
nRBC: 0 % (ref 0.0–0.2)

## 2022-11-25 LAB — BASIC METABOLIC PANEL
Anion gap: 11 (ref 5–15)
BUN: 24 mg/dL — ABNORMAL HIGH (ref 8–23)
CO2: 31 mmol/L (ref 22–32)
Calcium: 8.9 mg/dL (ref 8.9–10.3)
Chloride: 93 mmol/L — ABNORMAL LOW (ref 98–111)
Creatinine, Ser: 0.85 mg/dL (ref 0.61–1.24)
GFR, Estimated: >60 mL/min (ref >60)
Glucose, Bld: 86 mg/dL (ref 70–99)
Potassium: 3.7 mmol/L (ref 3.5–5.1)
Sodium: 135 mmol/L (ref 135–145)

## 2022-11-25 MED ORDER — LIDOCAINE-EPINEPHRINE (PF) 2 %-1:200000 IJ SOLN
20.0000 mL | Freq: Once | INTRAMUSCULAR | Status: AC
  Administered 2022-11-25: 20 mL
  Filled 2022-11-25: qty 20

## 2022-11-25 NOTE — ED Notes (Signed)
Trauma Response Nurse Documentation   Louis Sanchez is a 82 y.o. male arriving to [facility] ED via EMS  On Xarelto (rivaroxaban) daily. Trauma was activated as a Level 2 by ED Charge RN based on the following trauma criteria Elderly patients > 65 with head trauma on anti-coagulation (excluding ASA).  Patient cleared for CT by Dr. Manus Gunning. Pt transported to CT with trauma response nurse present to monitor. RN remained with the patient throughout their absence from the department for clinical observation.   GCS 14.  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 [facility])    Other and unspecified hyperlipidemia 1997   Other specified disorder of male genital organs(608.89) 08/2012   Sleep disturbance, unspecified    Thyroid disease    Unspecified hypothyroidism 2005     Past Surgical History:  Procedure Laterality Date   APPENDECTOMY  1953   colon polyps  2009   HERNIA REPAIR  1957   Right   INGUINAL HERNIA REPAIR Right 2014   SPINE SURGERY  1997   neck   WRIST SURGERY        Initial Focused Assessment (If applicable, or please see trauma documentation): - A/O x4 - slight confusion (baseline?) - PERRLA - Approx 5mm lac to R forehead (bleeding controlled) - c/o 2/10 pain  CT's Completed:   CT Head and CT C-Spine   Interventions:  - CXR - Pelvic XR - Foot XR - CT head  - CT c-spine -  Plan for disposition:  Discharge home   Consults completed:  none at 0930.  Event Summary: Pt is from [facility] BIB [facility] after sustaining a head injury due to a fall out of bed.  Pt states  he rolled out of bed and struck his head on the wall.  Lac to frontal scalp.  No new pain currently, no LOC.  Pt is on xarelto for a-fib.  Pt also on home O2.  Pt has a MOST form at bedside.   Bedside handoff with ED RN Lanora Manis.    Janora Norlander  Trauma Response RN  Please call TRN at [phone] for further assistance.

## 2022-11-25 NOTE — Progress Notes (Signed)
Orthopedic Tech Progress Note Patient Details:  Louis Sanchez 05-20-1941 [id number] Level 2 Trauma, not needed. Patient ID: [name], male   DOB: 11/28/1940, 82 y.o.   MRN: [id number]  Lovett Calender 11/25/2022, 8:43 AM

## 2022-11-25 NOTE — Discharge Instructions (Signed)
Keep wound clean and dry.  Staples should be removed in 1 week.  Chest x-ray does show stable fluid on the right lung.  This should be follow-up with your doctor.  Return to the ED with difficulty breathing, chest pain, vomiting, persistent headache, confusion or other concerns.

## 2022-11-25 NOTE — ED Notes (Signed)
Pt ambulated in hallway w/ steady gait w/o any assistance. Only complaint was lightheadedness.

## 2022-11-25 NOTE — ED Notes (Signed)
Report called to nursing staff at [facility].

## 2022-11-25 NOTE — ED Triage Notes (Signed)
Pt coming from SNF via EMS after an unwitnessed fall this AM. Pt is alert to baseline and states that he fell out of bed hitting his head on the wall. No LOC. Pt has a small lac to forehead, with bleeding controlled. Pt complains of 2/10 pain, as well as slight pain to top of left foot. No other injury noted or reported.

## 2022-11-25 NOTE — ED Notes (Signed)
[facility] called for transport to [facility], needs o2

## 2022-11-25 NOTE — ED Provider Notes (Signed)
[place] EMERGENC[facility] DEPARTMENT AT [facility] Provider Note   CSN: [id number] Arrival date & time: 11/25/22  6962     History  No chief complaint on file.   Louis Sanchez is a 82 y.o. male.  Patient from facility with head injury after falling out of bed.  States he rolled out of bed striking his head on the wall.  Sustained laceration to his frontal scalp.  Denies loss of consciousness.  Does take Xarelto for history of atrial fibrillation.  Denies any other injury.  No neck or back pain.  No chest pain, shortness of breath, abdominal pain.  Has ongoing left foot pain for "6 years" which is not changed today not from the fall.  Denies loss of consciousness.  Denies vomiting.  No focal weakness, numbness or tingling.  The history is provided by the patient and the EMS personnel.       Home Medications Prior to Admission medications   Medication Sig Start Date End Date Taking? Authorizing Provider  acetaminophen (TYLENOL) 325 MG tablet Take 650 mg by mouth every 6 (six) hours as needed for moderate pain.    [provider]  ferrous sulfate 325 (65 FE) MG tablet Take 325 mg by mouth at bedtime.    [provider]  furosemide (LASIX) 40 MG tablet Take 1 tablet (40 mg total) by mouth 2 (two) times daily. 08/10/22   Alford Highland, MD  gabapentin (NEURONTIN) 300 MG capsule Take 1 capsule (300 mg total) by mouth 3 (three) times daily. 08/10/22   Alford Highland, MD  levothyroxine (SYNTHROID) 175 MCG tablet Take 175 mcg by mouth See admin instructions. Take 1 tablet ( ) by mouth every Monday, Tuesday, Wednesday, Thursday, Friday and Saturday morning before breakfast    [provider]  metoprolol succinate (TOPROL XL) 25 MG 24 hr tablet Take 0.5 tablets (12.5 mg total) by mouth at bedtime. 08/10/22   Alford Highland, MD  midodrine (PROAMATINE) 5 MG tablet Take 1 tablet (5 mg total) by mouth 3 (three) times daily with meals. 09/09/22   Lurene Shadow,  MD  Multiple Vitamin (MULTIVITAMIN) tablet Take 1 tablet by mouth daily.    [provider]  nitroGLYCERIN (NITROSTAT) 0.4 MG SL tablet Place 0.4 mg under the tongue every 5 (five) minutes as needed for chest pain.    [provider]  polyvinyl alcohol (LIQUIFILM TEARS) 1.4 % ophthalmic solution Place 1 drop into both eyes as needed for dry eyes. 09/15/22   Esaw Grandchild A, DO  potassium chloride SA (KLOR-CON M) 20 MEQ tablet Take 1 tablet (20 mEq total) by mouth daily. 09/16/22   Pennie Banter, DO  promethazine (PHENERGAN) 25 MG tablet Take 25 mg by mouth every 6 (six) hours as needed for nausea or vomiting.    [provider]  Rivaroxaban (XARELTO) 20 MG TABS tablet Take 1 tablet (20 mg total) by mouth daily with supper. Patient not taking: Reported on 11/17/2022 09/09/22   Lurene Shadow, MD  rosuvastatin (CRESTOR) 10 MG tablet Take 10 mg by mouth at bedtime.    [provider]  sertraline (ZOLOFT) 100 MG tablet Take 150 mg by mouth daily.    [provider]  sodium chloride (OCEAN) 0.65 % SOLN nasal spray Place 1 spray into both nostrils as needed for congestion. 09/15/22   Pennie Banter, DO  spironolactone (ALDACTONE) 25 MG tablet Take 1 tablet (25 mg total) by mouth daily. 08/11/22   Alford Highland, MD  tiotropium (SPIRIVA) 18 MCG inhalation capsule Place 18 mcg into inhaler and inhale daily.    [provider]      Allergies    Atorvastatin and Levofloxacin    Review of Systems   Review of Systems  Constitutional:  Negative for activity change, appetite change and fever.  HENT:  Negative for congestion.   Respiratory:  Negative for cough, chest tightness and shortness of breath.   Gastrointestinal:  Negative for abdominal pain, nausea and vomiting.  Genitourinary:  Negative for dysuria and hematuria.  Musculoskeletal:  Negative for arthralgias and myalgias.  Skin:  Positive for wound.  Neurological:  Positive for weakness  and headaches. Negative for dizziness.   all other systems are negative except as noted in the HPI and PMH.    Physical Exam Updated Vital Signs BP 108/68   Pulse 86   Temp 97.6 F (36.4 C) (Oral)   Resp 14   Ht 5\' 6"  (1.676 m)   Wt 63.5 kg   SpO2 97%   BMI 22.60 kg/m  Physical Exam Vitals and nursing note reviewed.  Constitutional:      General: He is not in acute distress.    Appearance: He is well-developed.  HENT:     Head: Normocephalic.     Comments: 4 cm frontal scalp laceration, hemostatic    Mouth/Throat:     Pharynx: No oropharyngeal exudate.  Eyes:     Conjunctiva/sclera: Conjunctivae normal.     Pupils: Pupils are equal, round, and reactive to light.  Neck:     Comments: No midline C-spine tenderness Cardiovascular:     Rate and Rhythm: Normal rate. Rhythm irregular.     Heart sounds: Normal heart sounds. No murmur heard. Pulmonary:     Effort: Pulmonary effort is normal. No respiratory distress.     Breath sounds: Normal breath sounds.  Abdominal:     Palpations: Abdomen is soft.     Tenderness: There is no abdominal tenderness. There is no guarding or rebound.  Musculoskeletal:        General: No tenderness. Normal range of motion.     Cervical back: Normal range of motion and neck supple.     Comments: No T or L-spine tenderness, pelvis stable. Full range of motion hips bilaterally without pain.  Tenderness to palpation left dorsal foot.  Intact distal pulses  Skin:    General: Skin is warm.  Neurological:     Mental Status: He is alert and oriented to person, place, and time.     Cranial Nerves: No cranial nerve deficit.     Motor: No abnormal muscle tone.     Coordination: Coordination normal.     Comments:  5/5 strength throughout. CN 2-12 intact.Equal grip strength.   Psychiatric:        Behavior: Behavior normal.     ED Results / Procedures / Treatments   Labs (all labs ordered are listed, but only abnormal results are displayed) Labs  Reviewed  CBC WITH DIFFERENTIAL/PLATELET - Abnormal; Notable for the following components:      Result Value   RBC 3.14 (*)    Hemoglobin 8.5 (*)    HCT 28.0 (*)    RDW 18.0 (*)    Lymphs Abs 0.5 (*)    All other components within normal limits  BASIC METABOLIC PANEL - Abnormal; Notable for the following components:   Chloride 93 (*)    BUN 24 (*)    All other components within normal limits  EKG EKG Interpretation  Date/Time:  Friday Nov 25 2022 08:17:49 EDT Ventricular Rate:  74 PR Interval:    QRS Duration: 86 QT Interval:  485 QTC Calculation: 539 R Axis:   96 Text Interpretation: Atrial fibrillation Right axis deviation Borderline low voltage, extremity leads Prolonged QT interval No significant change was found Confirmed by Glynn Octave [phone]) on 11/25/2022 9:16:36 AM  Radiology CT Head Wo Contrast  Result Date: 11/25/2022 CLINICAL DATA:  Unwitnessed fall EXAM: CT HEAD WITHOUT CONTRAST CT CERVICAL SPINE WITHOUT CONTRAST TECHNIQUE: Multidetector CT imaging of the head and cervical spine was performed following the standard protocol without intravenous contrast. Multiplanar CT image reconstructions of the cervical spine were also generated. RADIATION DOSE REDUCTION: This exam was performed according to the departmental dose-optimization program which includes automated exposure control, adjustment of the mA and/or kV according to patient size and/or use of iterative reconstruction technique. COMPARISON:  CT head and cervical spine 10/22/2022 FINDINGS: CT HEAD FINDINGS Brain: There is no acute intracranial hemorrhage, extra-axial fluid collection, or acute infarct. Parenchymal volume is within expected limits for age. The ventricles are normal in size. Gray-white differentiation is preserved. Patchy hypodensity in the supratentorial white matter consistent with chronic small-vessel ischemic change is stable. The pituitary and suprasellar region are normal. There is no mass  lesion. There is no mass effect or midline shift. Vascular: There is calcification of the bilateral carotid siphons. Skull: Normal. Negative for fracture or focal lesion. Sinuses/Orbits: The paranasal sinuses are clear. A right lens implant is noted. The globes and orbits are otherwise unremarkable. Other: None. CT CERVICAL SPINE FINDINGS Alignment: There is exaggerated cervical lordosis with trace retrolisthesis of C4 on C5, unchanged and likely degenerative in nature. There is no jumped or perched facet or other evidence of traumatic malalignment. Skull base and vertebrae: Skull base alignment is maintained. Vertebral body heights are preserved. There is no evidence of acute fracture. There is remote fracture of the C7 spinous process, unchanged. Postsurgical changes reflecting C6-C7 ACDF are noted. There is no perihardware lucency. Soft tissues and spinal canal: No prevertebral fluid or swelling. No visible canal hematoma. 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 Upper chest: Right apical pleural thickening is similar to prior studies including a dedicated CT chest from 08/29/2022. Interlobular septal thickening in the right apex is also unchanged. Other: None. IMPRESSION: 1. Stable noncontrast head CT with no acute intracranial pathology. 2. No acute fracture or traumatic malalignment of the cervical spine. Electronically Signed   By: Lesia Hausen M.D.   On: 11/25/2022 09:10   CT Cervical Spine Wo Contrast  Result Date: 11/25/2022 CLINICAL DATA:  Unwitnessed fall EXAM: CT HEAD WITHOUT CONTRAST CT CERVICAL SPINE WITHOUT CONTRAST TECHNIQUE: Multidetector CT imaging of the head and cervical spine was performed following the standard protocol without intravenous contrast. Multiplanar CT image reconstructions of the cervical spine were also generated. RADIATION DOSE REDUCTION: This exam  was performed according to the departmental dose-optimization program which includes automated exposure control, adjustment of the mA and/or kV according to patient size and/or use of iterative reconstruction technique. COMPARISON:  CT head and cervical spine 10/22/2022 FINDINGS: CT HEAD FINDINGS Brain: There is no acute intracranial hemorrhage, extra-axial fluid collection, or acute infarct. Parenchymal volume is within expected limits for age. The ventricles are normal in size. Gray-white differentiation is preserved. Patchy hypodensity in the supratentorial white matter consistent with chronic small-vessel  ischemic change is stable. The pituitary and suprasellar region are normal. There is no mass lesion. There is no mass effect or midline shift. Vascular: There is calcification of the bilateral carotid siphons. Skull: Normal. Negative for fracture or focal lesion. Sinuses/Orbits: The paranasal sinuses are clear. A right lens implant is noted. The globes and orbits are otherwise unremarkable. Other: None. CT CERVICAL SPINE FINDINGS Alignment: There is exaggerated cervical lordosis with trace retrolisthesis of C4 on C5, unchanged and likely degenerative in nature. There is no jumped or perched facet or other evidence of traumatic malalignment. Skull base and vertebrae: Skull base alignment is maintained. Vertebral body heights are preserved. There is no evidence of acute fracture. There is remote fracture of the C7 spinous process, unchanged. Postsurgical changes reflecting C6-C7 ACDF are noted. There is no perihardware lucency. Soft tissues and spinal canal: No prevertebral fluid or swelling. No visible canal hematoma. 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 Upper chest: Right apical pleural thickening is similar to prior studies including a dedicated CT chest from 08/29/2022.  Interlobular septal thickening in the right apex is also unchanged. Other: None. IMPRESSION: 1. Stable noncontrast head CT with no acute intracranial pathology. 2. No acute fracture or traumatic malalignment of the cervical spine. Electronically Signed   By: Lesia Hausen M.D.   On: 11/25/2022 09:10   DG Foot Complete Right  Result Date: 11/25/2022 CLINICAL DATA:  Fall EXAM: RIGHT FOOT COMPLETE - 3+ VIEW COMPARISON:  None Available. FINDINGS: Osseous demineralization. Mild medial subluxations of the second and third MTP joints. Joint spaces otherwise preserved. No acute fracture, dislocation, or bone destruction. IMPRESSION: No acute osseous abnormalities. Electronically Signed   By: Ulyses Southward M.D.   On: 11/25/2022 08:29   DG Pelvis Portable  Result Date: 11/25/2022 CLINICAL DATA:  Fall EXAM: PORTABLE PELVIS 1-2 VIEWS COMPARISON:  Portable exam 0012 hours compared to 12/22/2022 FINDINGS: Hip and SI joint spaces preserved. Mild osseous demineralization. No acute fracture, dislocation, or bone destruction. IMPRESSION: No acute osseous abnormalities. Electronically Signed   By: Ulyses Southward M.D.   On: 11/25/2022 08:27   DG Chest Portable 1 View  Result Date: 11/25/2022 CLINICAL DATA:  Fall, head laceration EXAM: PORTABLE CHEST 1 VIEW COMPARISON:  Portable exam 0811 hours compared to 10/22/2022 FINDINGS: Enlargement of cardiac silhouette. Atherosclerotic calcification aorta. Mediastinal contours and pulmonary vascularity normal. Persistent small RIGHT pleural effusion with RIGHT basilar atelectasis. Increased interstitial markings at LEFT lung base versus previous exam, question developing infiltrate. Upper lungs clear. No pneumothorax or acute osseous findings. IMPRESSION: Persistent RIGHT pleural effusion and basilar atelectasis. Increased markings at lower LEFT lung new since previous exam question developing infiltrate. Electronically Signed   By: Ulyses Southward M.D.   On: 11/25/2022 08:26     Procedures .Marland KitchenLaceration Repair  Date/Time: 11/25/2022 8:50 AM  Performed by: Glynn Octave, MD Authorized by: Glynn Octave, MD   Consent:    Consent obtained:  Verbal   Consent given by:  Patient   Risks, benefits, and alternatives were discussed: yes     Risks discussed:  Infection, need for additional repair, poor wound healing, poor cosmetic result and pain   Alternatives discussed:  No treatment Universal protocol:    Procedure explained and questions answered to patient or proxy's satisfaction: yes     Relevant documents present and verified: yes     Test results available: yes  Imaging studies available: yes     Site/side marked: yes     Immediately prior to procedure, a time out was called: yes     Patient identity confirmed:  Verbally with patient Anesthesia:    Anesthesia method:  Local infiltration   Local anesthetic:  Lidocaine 2% WITH epi Laceration details:    Location:  Scalp   Scalp location:  Frontal   Length (cm):  4 Pre-procedure details:    Preparation:  Patient was prepped and draped in usual sterile fashion and imaging obtained to evaluate for foreign bodies Exploration:    Hemostasis achieved with:  Epinephrine and direct pressure   Imaging outcome: foreign body not noted     Wound exploration: wound explored through full range of motion and entire depth of wound visualized     Wound extent: no underlying fracture and no vascular damage     Contaminated: no   Treatment:    Area cleansed with:  Povidone-iodine   Amount of cleaning:  Standard   Irrigation solution:  Sterile saline   Irrigation method:  Pressure wash   Visualized foreign bodies/material removed: no   Skin repair:    Repair method:  Staples   Number of staples:  5 Approximation:    Approximation:  Close Repair type:    Repair type:  Simple Post-procedure details:    Dressing:  Antibiotic ointment and adhesive bandage   Procedure completion:  Tolerated well, no immediate  complications     Medications Ordered in ED Medications  lidocaine-EPINEPHrine (XYLOCAINE W/EPI) 2 %-1:200000 (PF) injection 20 mL (has no administration in time range)    ED Course/ Medical Decision Making/ A&P                             Medical Decision Making Amount and/or Complexity of Data Reviewed Independent Historian: EMS Labs: ordered. Decision-making details documented in ED Course. Radiology: ordered and independent interpretation performed. Decision-making details documented in ED Course. ECG/medicine tests: ordered and independent interpretation performed. Decision-making details documented in ED Course.  Risk Prescription drug management.   Fall with head injury and scalp laceration.  No loss of consciousness.  GCS 15, ABCs intact.  Does take Xarelto.  Neurologically at baseline.  Will obtain CT head and C-spine.  Repair scalp laceration.  X-ray shows chronic right-sided pleural effusion.  Similar to March 30.  Results reviewed and interpreted by me  Patient states he is aware of this pleural effusion which has been present for quite some time.  Denies any new shortness of breath, cough or fever.  Labs shows anemia improved from previous.  Electrolytes are stable. CT head and C-spine negative for acute traumatic injury.  Results reviewed interpreted by me.  Laceration repaired as above.  Will need staple removal in 7 days.  Tetanus is up-to-date.  Patient able to ambulate without assistance.  Denies any complaint. Stable to return to his facility.  Wound care instructions given.  Staple removal in 7 days.  Informed of right pleural effusion and anemia.  No evidence of active bleeding today.        Final Clinical Impression(s) / ED Diagnoses Final diagnoses:  Fall, initial encounter  Laceration of scalp, initial encounter    Rx / DC Orders ED Discharge Orders     None         Lanett Lasorsa, Jeannett Senior, MD 11/25/22 [phone]

## 2023-01-05 ENCOUNTER — Other Ambulatory Visit: Payer: Self-pay

## 2023-01-05 ENCOUNTER — Emergency Department (HOSPITAL_COMMUNITY): Payer: Medicare Other

## 2023-01-05 ENCOUNTER — Emergency Department (HOSPITAL_COMMUNITY)
Admission: EM | Admit: 2023-01-05 | Discharge: 2023-01-05 | Disposition: A | Discharge status: Home / Self Care | Payer: Medicare Other | Attending: Emergency Medicine | Admitting: Emergency Medicine

## 2023-01-05 ENCOUNTER — Encounter (HOSPITAL_COMMUNITY): Payer: Self-pay

## 2023-01-05 DIAGNOSIS — S0990XA Unspecified injury of head, initial encounter: Secondary | ICD-10-CM | POA: Diagnosis present

## 2023-01-05 DIAGNOSIS — W19XXXA Unspecified fall, initial encounter: Secondary | ICD-10-CM

## 2023-01-05 DIAGNOSIS — W06XXXA Fall from bed, initial encounter: Secondary | ICD-10-CM | POA: Diagnosis not present

## 2023-01-05 DIAGNOSIS — Z7901 Long term (current) use of anticoagulants: Secondary | ICD-10-CM | POA: Diagnosis not present

## 2023-01-05 DIAGNOSIS — S0181XA Laceration without foreign body of other part of head, initial encounter: Secondary | ICD-10-CM | POA: Diagnosis not present

## 2023-01-05 NOTE — ED Provider Notes (Signed)
[facility] AT [facility] Provider Note   CSN: [id number] Arrival date & time: 01/05/23  0846     History  Chief Complaint  Patient presents with   Fall    On thinners    Louis Sanchez is a 82 y.o. male.  83 yo M with with a chief complaints of a fall.  Reportedly the patient fell out of bed. That is how he woke up this morning.  He has no complaints.  Was noted to have a injury to the right frontal region.  Sent here for evaluation.  Reportedly by staff on blood thinners arrived as a level 2 trauma   Fall       Home Medications Prior to Admission medications   Medication Sig Start Date End Date Taking? Authorizing Provider  acetaminophen (TYLENOL) 325 MG tablet Take 650 mg by mouth every 6 (six) hours as needed for moderate pain.    [provider]  ferrous sulfate 325 (65 FE) MG tablet Take 325 mg by mouth at bedtime.    [provider]  furosemide (LASIX) 40 MG tablet Take 1 tablet (40 mg total) by mouth 2 (two) times daily. 08/10/22   Alford Highland, MD  gabapentin (NEURONTIN) 300 MG capsule Take 1 capsule (300 mg total) by mouth 3 (three) times daily. 08/10/22   Alford Highland, MD  levothyroxine (SYNTHROID) 175 MCG tablet Take 175 mcg by mouth See admin instructions. Take 1 tablet ( ) by mouth every Monday, Tuesday, Wednesday, Thursday, Friday and Saturday morning before breakfast    [provider]  metoprolol succinate (TOPROL XL) 25 MG 24 hr tablet Take 0.5 tablets (12.5 mg total) by mouth at bedtime. 08/10/22   Alford Highland, MD  midodrine (PROAMATINE) 5 MG tablet Take 1 tablet (5 mg total) by mouth 3 (three) times daily with meals. 09/09/22   Lurene Shadow, MD  Multiple Vitamin (MULTIVITAMIN) tablet Take 1 tablet by mouth daily.    [provider]  nitroGLYCERIN (NITROSTAT) 0.4 MG SL tablet Place 0.4 mg under the tongue every 5 (five) minutes as needed for chest pain.    [provider]  polyvinyl alcohol (LIQUIFILM TEARS) 1.4 % ophthalmic solution Place 1 drop into both eyes as needed for dry eyes. 09/15/22   Esaw Grandchild A, DO  potassium chloride SA (KLOR-CON M) 20 MEQ tablet Take 1 tablet (20 mEq total) by mouth daily. 09/16/22   Pennie Banter, DO  promethazine (PHENERGAN) 25 MG tablet Take 25 mg by mouth every 6 (six) hours as needed for nausea or vomiting.    [provider]  Rivaroxaban (XARELTO) 20 MG TABS tablet Take 1 tablet (20 mg total) by mouth daily with supper. Patient not taking: Reported on 11/17/2022 09/09/22   Lurene Shadow, MD  rosuvastatin (CRESTOR) 10 MG tablet Take 10 mg by mouth at bedtime.    [provider]  sertraline (ZOLOFT) 100 MG tablet Take 150 mg by mouth daily.    [provider]  sodium chloride (OCEAN) 0.65 % SOLN nasal spray Place 1 spray into both nostrils as needed for congestion. 09/15/22   Pennie Banter, DO  spironolactone (ALDACTONE) 25 MG tablet Take 1 tablet (25 mg total) by mouth daily. 08/11/22   Alford Highland, MD  tiotropium (SPIRIVA) 18 MCG inhalation capsule Place 18 mcg into inhaler and inhale daily.    [provider]      Allergies    Atorvastatin and Levofloxacin  Review of Systems   Review of Systems  Physical Exam Updated Vital Signs BP 112/70   Pulse 88   Temp (!) 97.4 F (36.3 C) (Oral)   Resp 16   Ht 5\' 6"  (1.676 m)   Wt 52.2 kg   SpO2 100%   BMI 18.56 kg/m  Physical Exam Vitals and nursing note reviewed.  Constitutional:      Appearance: He is well-developed.  HENT:     Head: Normocephalic.     Comments: Skin tear to the right frontal area.  Superficial. Eyes:     Pupils: Pupils are equal, round, and reactive to light.  Neck:     Vascular: No JVD.  Cardiovascular:     Rate and Rhythm: Normal rate and regular rhythm.     Heart sounds: No murmur heard.    No friction rub. No gallop.  Pulmonary:     Effort: No respiratory distress.     Breath  sounds: No wheezing.  Abdominal:     General: There is no distension.     Tenderness: There is no abdominal tenderness. There is no guarding or rebound.  Musculoskeletal:        General: Normal range of motion.     Cervical back: Normal range of motion and neck supple.     Comments: Multiple areas of bandages placed about his knees and elbows.  Skin:    Coloration: Skin is not pale.     Findings: No rash.  Neurological:     Mental Status: He is alert and oriented to person, place, and time.  Psychiatric:        Behavior: Behavior normal.     ED Results / Procedures / Treatments   Labs (all labs ordered are listed, but only abnormal results are displayed) Labs Reviewed - No data to display  EKG None  Radiology CT Head Wo Contrast  Result Date: 01/05/2023 CLINICAL DATA:  82 year old male status post fall from bed. EXAM: CT HEAD WITHOUT CONTRAST TECHNIQUE: Contiguous axial images were obtained from the base of the skull through the vertex without intravenous contrast. RADIATION DOSE REDUCTION: This exam was performed according to the departmental dose-optimization program which includes automated exposure control, adjustment of the mA and/or kV according to patient size and/or use of iterative reconstruction technique. COMPARISON:  Head CT 11/25/2022 and earlier. FINDINGS: Brain: Stable cerebral volume. No midline shift, ventriculomegaly, mass effect, evidence of mass lesion, intracranial hemorrhage or evidence of cortically based acute infarction. Stable gray-white differentiation, chronic periventricular white matter hypodensity. No cortical encephalomalacia identified. Deep gray nuclei, brainstem and cerebellum appear stable and negative. Vascular: Calcified atherosclerosis at the skull base. No suspicious intracranial vascular hyperdensity. Skull: No acute fracture identified. Sinuses/Orbits: Visualized paranasal sinuses and mastoids are stable and well aerated. Other: Broad-based right  superior convexity scalp hematoma with trace soft tissue gas suggesting associated laceration. Underlying right frontal bone appears intact. No other acute orbit or scalp soft tissue injury identified. IMPRESSION: 1. Right scalp hematoma and laceration. No underlying skull fracture. 2. No acute intracranial abnormality. Stable chronic white matter changes, mild to moderate for age. Electronically Signed   By: Odessa Fleming M.D.   On: 01/05/2023 09:21    Procedures Procedures    Medications Ordered in ED Medications - No data to display  ED Course/ Medical Decision Making/ A&P  Medical Decision Making Amount and/or Complexity of Data Reviewed Radiology: ordered.   82 yo M with a chief complaints of an unwitnessed fall.  Nonsyncopal by history.  Patient has no complaints.  He has a small break to the skin to the right frontal region that I do not think would benefit from wound repair.  Will obtain a CT scan of the head.  He has no midline C-spine tenderness and is able to rotate his head without discomfort.  Do not feel I need to image the C-spine.  CT without ICH on my independent interpretation.  D/c home.  PCP follow up.  9:33 AM:  I have discussed the diagnosis/risks/treatment options with the patient.  Evaluation and diagnostic testing in the emergency department does not suggest an emergent condition requiring admission or immediate intervention beyond what has been performed at this time.  They will follow up with PCP. We also discussed returning to the ED immediately if new or worsening sx occur. We discussed the sx which are most concerning (e.g., sudden worsening pain, fever, inability to tolerate by mouth) that necessitate immediate return. Medications administered to the patient during their visit and any new prescriptions provided to the patient are listed below.  Medications given during this visit Medications - No data to display   The patient appears  reasonably screen and/or stabilized for discharge and I doubt any other medical condition or other [facility] requiring further screening, evaluation, or treatment in the ED at this time prior to discharge.          Final Clinical Impression(s) / ED Diagnoses Final diagnoses:  Fall, initial encounter  Facial laceration, initial encounter    Rx / DC Orders ED Discharge Orders     None         Melene Plan, DO 01/05/23 [phone]

## 2023-01-05 NOTE — ED Notes (Signed)
Ptar called 

## 2023-01-05 NOTE — Discharge Instructions (Addendum)
Your CT did not show any bleeding inside the skull.  Please return for worsening headache, confusion, vomiting.   I dont think it would help to close your wound to your forehead. You can apply an ointment to the are two times a day.  Return for redness drainage fever.

## 2023-01-05 NOTE — ED Triage Notes (Signed)
From Louis Sanchez via ems for fall on thinners. Patient states he rolled out of bed, and landed on linoleum floor. Struck right lateral forehead on floor, hematoma with skin tear noted. Pt is reported to be on blood thinners. Denies LOC. Denies pain at this time. Patient is alert and oriented x 3. VSS.

## 2023-02-06 ENCOUNTER — Other Ambulatory Visit: Payer: Self-pay

## 2023-02-06 ENCOUNTER — Inpatient Hospital Stay
Admission: EM | Admit: 2023-02-06 | Discharge: 2023-02-11 | DRG: 193 | Disposition: A | Discharge status: Skilled Nursing Facility | Payer: Medicare Other | Source: Skilled Nursing Facility | Attending: Internal Medicine | Admitting: Internal Medicine

## 2023-02-06 ENCOUNTER — Emergency Department: Payer: Medicare Other

## 2023-02-06 DIAGNOSIS — K7460 Unspecified cirrhosis of liver: Secondary | ICD-10-CM | POA: Diagnosis present

## 2023-02-06 DIAGNOSIS — K219 Gastro-esophageal reflux disease without esophagitis: Secondary | ICD-10-CM | POA: Diagnosis present

## 2023-02-06 DIAGNOSIS — Z9981 Dependence on supplemental oxygen: Secondary | ICD-10-CM | POA: Diagnosis not present

## 2023-02-06 DIAGNOSIS — L89132 Pressure ulcer of right lower back, stage 2: Secondary | ICD-10-CM | POA: Diagnosis present

## 2023-02-06 DIAGNOSIS — J90 Pleural effusion, not elsewhere classified: Secondary | ICD-10-CM

## 2023-02-06 DIAGNOSIS — D509 Iron deficiency anemia, unspecified: Secondary | ICD-10-CM | POA: Diagnosis present

## 2023-02-06 DIAGNOSIS — C7B8 Other secondary neuroendocrine tumors: Secondary | ICD-10-CM | POA: Diagnosis present

## 2023-02-06 DIAGNOSIS — C7A8 Other malignant neuroendocrine tumors: Secondary | ICD-10-CM | POA: Diagnosis present

## 2023-02-06 DIAGNOSIS — Z7901 Long term (current) use of anticoagulants: Secondary | ICD-10-CM

## 2023-02-06 DIAGNOSIS — L89142 Pressure ulcer of left lower back, stage 2: Secondary | ICD-10-CM | POA: Diagnosis present

## 2023-02-06 DIAGNOSIS — Z681 Body mass index (BMI) 19 or less, adult: Secondary | ICD-10-CM

## 2023-02-06 DIAGNOSIS — L89122 Pressure ulcer of left upper back, stage 2: Secondary | ICD-10-CM | POA: Diagnosis present

## 2023-02-06 DIAGNOSIS — I5033 Acute on chronic diastolic (congestive) heart failure: Secondary | ICD-10-CM | POA: Diagnosis present

## 2023-02-06 DIAGNOSIS — Z66 Do not resuscitate: Secondary | ICD-10-CM | POA: Diagnosis present

## 2023-02-06 DIAGNOSIS — Z820 Family history of epilepsy and other diseases of the nervous system: Secondary | ICD-10-CM

## 2023-02-06 DIAGNOSIS — I482 Chronic atrial fibrillation, unspecified: Secondary | ICD-10-CM | POA: Diagnosis present

## 2023-02-06 DIAGNOSIS — I509 Heart failure, unspecified: Secondary | ICD-10-CM

## 2023-02-06 DIAGNOSIS — J918 Pleural effusion in other conditions classified elsewhere: Secondary | ICD-10-CM | POA: Diagnosis present

## 2023-02-06 DIAGNOSIS — R1314 Dysphagia, pharyngoesophageal phase: Secondary | ICD-10-CM | POA: Diagnosis present

## 2023-02-06 DIAGNOSIS — Z79899 Other long term (current) drug therapy: Secondary | ICD-10-CM

## 2023-02-06 DIAGNOSIS — R188 Other ascites: Secondary | ICD-10-CM | POA: Diagnosis not present

## 2023-02-06 DIAGNOSIS — Z881 Allergy status to other antibiotic agents status: Secondary | ICD-10-CM

## 2023-02-06 DIAGNOSIS — I959 Hypotension, unspecified: Secondary | ICD-10-CM | POA: Diagnosis present

## 2023-02-06 DIAGNOSIS — Z79890 Hormone replacement therapy (postmenopausal): Secondary | ICD-10-CM

## 2023-02-06 DIAGNOSIS — E039 Hypothyroidism, unspecified: Secondary | ICD-10-CM | POA: Diagnosis present

## 2023-02-06 DIAGNOSIS — Z9049 Acquired absence of other specified parts of digestive tract: Secondary | ICD-10-CM

## 2023-02-06 DIAGNOSIS — E785 Hyperlipidemia, unspecified: Secondary | ICD-10-CM | POA: Diagnosis present

## 2023-02-06 DIAGNOSIS — J9621 Acute and chronic respiratory failure with hypoxia: Secondary | ICD-10-CM | POA: Diagnosis present

## 2023-02-06 DIAGNOSIS — Z8719 Personal history of other diseases of the digestive system: Secondary | ICD-10-CM

## 2023-02-06 DIAGNOSIS — Z1152 Encounter for screening for COVID-19: Secondary | ICD-10-CM

## 2023-02-06 DIAGNOSIS — J159 Unspecified bacterial pneumonia: Principal | ICD-10-CM | POA: Diagnosis present

## 2023-02-06 DIAGNOSIS — J9601 Acute respiratory failure with hypoxia: Principal | ICD-10-CM

## 2023-02-06 DIAGNOSIS — L89112 Pressure ulcer of right upper back, stage 2: Secondary | ICD-10-CM | POA: Diagnosis present

## 2023-02-06 DIAGNOSIS — I110 Hypertensive heart disease with heart failure: Secondary | ICD-10-CM | POA: Diagnosis present

## 2023-02-06 DIAGNOSIS — R531 Weakness: Secondary | ICD-10-CM | POA: Diagnosis not present

## 2023-02-06 DIAGNOSIS — R54 Age-related physical debility: Secondary | ICD-10-CM | POA: Diagnosis present

## 2023-02-06 DIAGNOSIS — Z86010 Personal history of colonic polyps: Secondary | ICD-10-CM

## 2023-02-06 DIAGNOSIS — E43 Unspecified severe protein-calorie malnutrition: Secondary | ICD-10-CM | POA: Diagnosis present

## 2023-02-06 DIAGNOSIS — J189 Pneumonia, unspecified organism: Secondary | ICD-10-CM | POA: Diagnosis present

## 2023-02-06 DIAGNOSIS — Z888 Allergy status to other drugs, medicaments and biological substances status: Secondary | ICD-10-CM

## 2023-02-06 DIAGNOSIS — Z87891 Personal history of nicotine dependence: Secondary | ICD-10-CM

## 2023-02-06 LAB — RESP PANEL BY RT-PCR (RSV, FLU A&B, COVID)  RVPGX2
Influenza A by PCR: NEGATIVE
Influenza B by PCR: NEGATIVE
Resp Syncytial Virus by PCR: NEGATIVE
SARS Coronavirus 2 by RT PCR: NEGATIVE

## 2023-02-06 LAB — BASIC METABOLIC PANEL
Anion gap: 9 (ref 5–15)
BUN: 22 mg/dL (ref 8–23)
CO2: 27 mmol/L (ref 22–32)
Calcium: 8.3 mg/dL — ABNORMAL LOW (ref 8.9–10.3)
Chloride: 98 mmol/L (ref 98–111)
Creatinine, Ser: 1 mg/dL (ref 0.61–1.24)
GFR, Estimated: >60 mL/min (ref >60)
Glucose, Bld: 109 mg/dL — ABNORMAL HIGH (ref 70–99)
Potassium: 3.7 mmol/L (ref 3.5–5.1)
Sodium: 134 mmol/L — ABNORMAL LOW (ref 135–145)

## 2023-02-06 LAB — TROPONIN I (HIGH SENSITIVITY): Troponin I (High Sensitivity): 14 ng/L (ref <18)

## 2023-02-06 LAB — URINALYSIS, ROUTINE W REFLEX MICROSCOPIC
Bilirubin Urine: NEGATIVE
Glucose, UA: NEGATIVE mg/dL
Hgb urine dipstick: NEGATIVE
Ketones, ur: NEGATIVE mg/dL
Leukocytes,Ua: NEGATIVE
Nitrite: NEGATIVE
Protein, ur: 30 mg/dL — AB
Specific Gravity, Urine: 1.011 (ref 1.005–1.030)
pH: 5 (ref 5.0–8.0)

## 2023-02-06 LAB — HEPATIC FUNCTION PANEL
ALT: 28 U/L (ref 0–44)
AST: 44 U/L — ABNORMAL HIGH (ref 15–41)
Albumin: 2.7 g/dL — ABNORMAL LOW (ref 3.5–5.0)
Alkaline Phosphatase: 181 U/L — ABNORMAL HIGH (ref 38–126)
Bilirubin, Direct: 0.4 mg/dL — ABNORMAL HIGH (ref 0.0–0.2)
Indirect Bilirubin: 0.8 mg/dL (ref 0.3–0.9)
Total Bilirubin: 1.2 mg/dL (ref 0.3–1.2)
Total Protein: 7.2 g/dL (ref 6.5–8.1)

## 2023-02-06 LAB — CBC
HCT: 38.6 % — ABNORMAL LOW (ref 39.0–52.0)
Hemoglobin: 10.5 g/dL — ABNORMAL LOW (ref 13.0–17.0)
MCH: 25 pg — ABNORMAL LOW (ref 26.0–34.0)
MCHC: 27.2 g/dL — ABNORMAL LOW (ref 30.0–36.0)
MCV: 91.9 fL (ref 80.0–100.0)
Platelets: 188 10*3/uL (ref 150–400)
RBC: 4.2 MIL/uL — ABNORMAL LOW (ref 4.22–5.81)
RDW: 22.5 % — ABNORMAL HIGH (ref 11.5–15.5)
WBC: 8.2 10*3/uL (ref 4.0–10.5)
nRBC: 0 % (ref 0.0–0.2)

## 2023-02-06 LAB — PROCALCITONIN: Procalcitonin: 1.9 ng/mL

## 2023-02-06 LAB — PROTIME-INR
INR: 1.4 — ABNORMAL HIGH (ref 0.8–1.2)
Prothrombin Time: 17.5 seconds — ABNORMAL HIGH (ref 11.4–15.2)

## 2023-02-06 LAB — BRAIN NATRIURETIC PEPTIDE: B Natriuretic Peptide: 907.4 pg/mL — ABNORMAL HIGH (ref 0.0–100.0)

## 2023-02-06 LAB — TSH: TSH: 39.49 u[IU]/mL — ABNORMAL HIGH (ref 0.350–4.500)

## 2023-02-06 MED ORDER — SERTRALINE HCL 100 MG PO TABS
150.0000 mg | ORAL_TABLET | Freq: Every day | ORAL | Status: DC
  Administered 2023-02-06 – 2023-02-11 (×6): 150 mg via ORAL
  Filled 2023-02-06: qty 1.5
  Filled 2023-02-06: qty 3
  Filled 2023-02-06: qty 1.5
  Filled 2023-02-06: qty 3
  Filled 2023-02-06: qty 1.5
  Filled 2023-02-06 (×2): qty 3
  Filled 2023-02-06 (×2): qty 1.5
  Filled 2023-02-06: qty 3
  Filled 2023-02-06: qty 1.5
  Filled 2023-02-06: qty 3

## 2023-02-06 MED ORDER — POLYETHYLENE GLYCOL 3350 17 G PO PACK
17.0000 g | PACK | Freq: Every day | ORAL | Status: DC | PRN
  Administered 2023-02-10 – 2023-02-11 (×2): 17 g via ORAL
  Filled 2023-02-06 (×3): qty 1

## 2023-02-06 MED ORDER — MIDODRINE HCL 5 MG PO TABS
5.0000 mg | ORAL_TABLET | Freq: Three times a day (TID) | ORAL | Status: DC
  Administered 2023-02-07 – 2023-02-11 (×13): 5 mg via ORAL
  Filled 2023-02-06 (×13): qty 1

## 2023-02-06 MED ORDER — FERROUS SULFATE 325 (65 FE) MG PO TABS
325.0000 mg | ORAL_TABLET | Freq: Every day | ORAL | Status: DC
  Administered 2023-02-06 – 2023-02-10 (×5): 325 mg via ORAL
  Filled 2023-02-06 (×5): qty 1

## 2023-02-06 MED ORDER — ONDANSETRON HCL 4 MG PO TABS: 4.0000 mg | ORAL_TABLET | Freq: Four times a day (QID) | ORAL | Status: DC | PRN

## 2023-02-06 MED ORDER — METOPROLOL SUCCINATE ER 25 MG PO TB24
12.5000 mg | ORAL_TABLET | Freq: Every day | ORAL | Status: DC
  Administered 2023-02-06 – 2023-02-10 (×5): 12.5 mg via ORAL
  Filled 2023-02-06 (×5): qty 1

## 2023-02-06 MED ORDER — TIOTROPIUM BROMIDE MONOHYDRATE 18 MCG IN CAPS
18.0000 ug | ORAL_CAPSULE | Freq: Every day | RESPIRATORY_TRACT | Status: DC
  Administered 2023-02-07 – 2023-02-11 (×5): 18 ug via RESPIRATORY_TRACT
  Filled 2023-02-06 (×3): qty 5

## 2023-02-06 MED ORDER — ADULT MULTIVITAMIN W/MINERALS CH
1.0000 | ORAL_TABLET | Freq: Every day | ORAL | Status: DC
  Administered 2023-02-06 – 2023-02-11 (×6): 1 via ORAL
  Filled 2023-02-06 (×6): qty 1

## 2023-02-06 MED ORDER — ONDANSETRON HCL 4 MG/2ML IJ SOLN
4.0000 mg | Freq: Four times a day (QID) | INTRAMUSCULAR | Status: DC | PRN
  Administered 2023-02-10: 4 mg via INTRAVENOUS
  Filled 2023-02-06: qty 2

## 2023-02-06 MED ORDER — SODIUM CHLORIDE 0.9 % IV SOLN
500.0000 mg | INTRAVENOUS | Status: AC
  Administered 2023-02-06 – 2023-02-10 (×5): 500 mg via INTRAVENOUS
  Filled 2023-02-06 (×5): qty 5

## 2023-02-06 MED ORDER — SODIUM CHLORIDE 0.9 % IV SOLN: 500.0000 mg | Freq: Once | INTRAVENOUS | Status: DC

## 2023-02-06 MED ORDER — SODIUM CHLORIDE 0.9 % IV SOLN
1.0000 g | Freq: Once | INTRAVENOUS | Status: AC
  Administered 2023-02-06: 1 g via INTRAVENOUS
  Filled 2023-02-06: qty 10

## 2023-02-06 MED ORDER — SPIRONOLACTONE 25 MG PO TABS
25.0000 mg | ORAL_TABLET | Freq: Every day | ORAL | Status: DC
  Administered 2023-02-06 – 2023-02-11 (×6): 25 mg via ORAL
  Filled 2023-02-06 (×6): qty 1

## 2023-02-06 MED ORDER — FUROSEMIDE 10 MG/ML IJ SOLN
40.0000 mg | Freq: Every day | INTRAMUSCULAR | Status: DC
  Administered 2023-02-07 – 2023-02-11 (×5): 40 mg via INTRAVENOUS
  Filled 2023-02-06 (×5): qty 4

## 2023-02-06 MED ORDER — SODIUM CHLORIDE 0.9 % IV SOLN
2.0000 g | INTRAVENOUS | Status: DC
  Administered 2023-02-07 – 2023-02-09 (×3): 2 g via INTRAVENOUS
  Filled 2023-02-06 (×3): qty 20

## 2023-02-06 MED ORDER — ACETAMINOPHEN 325 MG PO TABS: 650.0000 mg | ORAL_TABLET | Freq: Four times a day (QID) | ORAL | Status: DC | PRN

## 2023-02-06 MED ORDER — ROSUVASTATIN CALCIUM 10 MG PO TABS
10.0000 mg | ORAL_TABLET | Freq: Every day | ORAL | Status: DC
  Administered 2023-02-06 – 2023-02-10 (×5): 10 mg via ORAL
  Filled 2023-02-06 (×6): qty 1

## 2023-02-06 MED ORDER — LEVOTHYROXINE SODIUM 50 MCG PO TABS
175.0000 ug | ORAL_TABLET | Freq: Every day | ORAL | Status: DC
  Administered 2023-02-07: 175 ug via ORAL
  Filled 2023-02-06: qty 1

## 2023-02-06 MED ORDER — ENOXAPARIN SODIUM 40 MG/0.4ML IJ SOSY
40.0000 mg | PREFILLED_SYRINGE | INTRAMUSCULAR | Status: DC
  Administered 2023-02-06 – 2023-02-10 (×5): 40 mg via SUBCUTANEOUS
  Filled 2023-02-06 (×5): qty 0.4

## 2023-02-06 MED ORDER — SODIUM CHLORIDE 0.9% FLUSH
3.0000 mL | Freq: Two times a day (BID) | INTRAVENOUS | Status: DC
  Administered 2023-02-06 – 2023-02-11 (×10): 3 mL via INTRAVENOUS

## 2023-02-06 MED ORDER — POLYVINYL ALCOHOL 1.4 % OP SOLN
1.0000 [drp] | OPHTHALMIC | Status: DC | PRN
  Administered 2023-02-07: 1 [drp] via OPHTHALMIC
  Filled 2023-02-06: qty 15

## 2023-02-06 MED ORDER — GABAPENTIN 300 MG PO CAPS
300.0000 mg | ORAL_CAPSULE | Freq: Three times a day (TID) | ORAL | Status: DC
  Administered 2023-02-06 – 2023-02-11 (×14): 300 mg via ORAL
  Filled 2023-02-06 (×14): qty 1

## 2023-02-06 MED ORDER — ACETAMINOPHEN 650 MG RE SUPP: 650.0000 mg | Freq: Four times a day (QID) | RECTAL | Status: DC | PRN

## 2023-02-06 MED ORDER — SALINE SPRAY 0.65 % NA SOLN: 1.0000 | NASAL | Status: DC | PRN

## 2023-02-06 MED ORDER — POTASSIUM CHLORIDE CRYS ER 20 MEQ PO TBCR
20.0000 meq | EXTENDED_RELEASE_TABLET | Freq: Every day | ORAL | Status: DC
  Administered 2023-02-06 – 2023-02-11 (×6): 20 meq via ORAL
  Filled 2023-02-06 (×5): qty 1
  Filled 2023-02-06: qty 2

## 2023-02-06 MED ORDER — FUROSEMIDE 10 MG/ML IJ SOLN
80.0000 mg | Freq: Once | INTRAMUSCULAR | Status: AC
  Administered 2023-02-06: 80 mg via INTRAVENOUS
  Filled 2023-02-06: qty 8

## 2023-02-06 NOTE — Progress Notes (Signed)
   02/06/23 2129  BiPAP/CPAP/SIPAP  Reason BIPAP/CPAP not in use Non-compliant (Patient declined CPAP at this time)

## 2023-02-06 NOTE — Assessment & Plan Note (Signed)
Rate controlled.  Apparently Xarelto was discontinued. -Continue with metoprolol

## 2023-02-06 NOTE — Assessment & Plan Note (Addendum)
Patient also has an history of liver malignancy. Not on any active treatment. -Hepatic function panel -Continue spironolactone and Lasix

## 2023-02-06 NOTE — ED Notes (Signed)
Patients bedding changed

## 2023-02-06 NOTE — Assessment & Plan Note (Signed)
 Check TSH -Continue home Synthroid

## 2023-02-06 NOTE — Assessment & Plan Note (Signed)
-  Continue statin for now, hepatic function pending

## 2023-02-06 NOTE — Assessment & Plan Note (Signed)
Continue home midodrine. 

## 2023-02-06 NOTE — Assessment & Plan Note (Signed)
Worsening pleural effusion and pulmonary vascular congestion on imaging.  BNP elevated at 907. Prior echocardiogram was normal. -IV Lasix 40 mg daily -Strict intake and output -Daily weight and BMP

## 2023-02-06 NOTE — H&P (Signed)
History and Physical    Patient: Louis Sanchez:[id number] DOB: Aug 07, 1940 DOA: 02/06/2023 DOS: the patient was seen and examined on 02/06/2023 PCP: Lynnea Ferrier, MD  Patient coming from: SN[facility]  Chief Complaint:  Chief Complaint  Patient presents with   Weakness   HPI: Louis Sanchez is a 82 y.o. 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  History was obtained from daughter on phone and according to her patient was not overall feeling well for the past 1 to 2 weeks with decreased appetite and poor p.o. intake, worsening weakness to the point that he was not getting out of bed recently.  Increased cough.  No fever or chills.  No urinary symptoms.  At baseline patient knows about himself and where he is.  Currently worsening confusion.  Per daughter they were concerned about fluid overload as he exhibited similar symptoms in the past and was diagnosed with CHF exacerbation.  Patient was unable to provide any meaningful history.  ED course and data reviewed.  Afebrile with mild tachypnea and desaturated up to 88% on 2 L of oxygen, saturation improved on 4 L.  Labs pertinent for procalcitonin of 1.90, BNP 907, CBC and BMP stable. CT head was without any acute abnormality. CXR with persistent right pleural effusion with right lower lobe infiltrate, unchanged from prior studies.  Increasing left pleural effusion with left lower lobe in filtrate versus atelectasis. Also showed cardiomegaly with vascular congestion.  EKG.  Personally reviewed.  Atrial fibrillation, heart rate of 80 and right axis deviation.  Patient received IV Lasix, ceftriaxone and Zithromax in  ED.  Thoracentesis was offered-daughter will discuss with family and let us know.  Review of Systems: unable to review all systems due to the inability of the patient to answer questions. 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 His smoking use included cigarettes. He has never used smokeless tobacco. He reports that he does not currently use alcohol after a past usage of about 15.0 standard drinks of alcohol per week. He reports that he does not use drugs.  Allergies  Allergen Reactions   Atorvastatin     Other reaction(s): Muscle Pain Other reaction(s): Muscle Pain  Other reaction(s): Muscle Pain Other reaction(s): Muscle Pain Other reaction(s): Muscle Pain    Levofloxacin Rash    Family History  Problem Relation Age of Onset   Alzheimer's disease Mother     Prior to Admission medications   Medication Sig Start Date End Date Taking? Authorizing Provider  acetaminophen (TYLENOL) 325 MG tablet Take 650 mg by mouth every 6 (six) hours as needed for moderate pain or fever.   Yes [provider]  ferrous sulfate 325 (65 FE) MG  tablet Take 325 mg by mouth at bedtime.   Yes [provider]  furosemide (LASIX) 40 MG tablet Take 1 tablet (40 mg total) by mouth 2 (two) times daily. Patient taking differently: Take 20 mg by mouth 2 (two) times daily. 08/10/22  Yes Wieting, Richard, MD  gabapentin (NEURONTIN) 300 MG capsule Take 1 capsule (300 mg total) by mouth 3 (three) times daily. 08/10/22  Yes Alford Highland, MD  levothyroxine (SYNTHROID) 175 MCG tablet Take 175 mcg by mouth daily before breakfast.   Yes [provider]  metoprolol succinate (TOPROL XL) 25 MG 24 hr tablet Take 0.5 tablets (12.5 mg total) by mouth at bedtime. 08/10/22  Yes Wieting, Richard, MD  midodrine (PROAMATINE) 5 MG tablet Take 1 tablet (5 mg total) by mouth 3 (three) times daily with meals. 09/09/22  Yes Lurene Shadow, MD  Multiple Vitamin (MULTIVITAMIN) tablet Take 1 tablet by mouth daily.   Yes [provider]  nitroGLYCERIN (NITROSTAT) 0.4 MG SL tablet Place 0.4 mg under the tongue every 5 (five) minutes as needed for chest pain.   Yes [provider]  polyvinyl alcohol (LIQUIFILM TEARS) 1.4 % ophthalmic solution Place 1 drop into both eyes as needed for dry eyes. 09/15/22  Yes Esaw Grandchild A, DO  potassium chloride SA (KLOR-CON M) 20 MEQ tablet Take 1 tablet (20 mEq total) by mouth daily. 09/16/22  Yes Pennie Banter, DO  promethazine (PHENERGAN) 25 MG tablet Take 25 mg by mouth every 4 (four) hours as needed for nausea or vomiting.   Yes [provider]  rosuvastatin (CRESTOR) 10 MG tablet Take 10 mg by mouth at bedtime.   Yes [provider]  Sertraline HCl 150 MG CAPS Take 150 mg by mouth daily.   Yes [provider]  sodium chloride (OCEAN) 0.65 % SOLN nasal spray Place 1 spray into both nostrils as needed for congestion. 09/15/22  Yes Esaw Grandchild A, DO  spironolactone (ALDACTONE) 25 MG tablet Take 1 tablet (25 mg total) by mouth daily. 08/11/22  Yes Wieting, Richard, MD   tiotropium (SPIRIVA) 18 MCG inhalation capsule Place 18 mcg into inhaler and inhale daily.   Yes [provider]  Rivaroxaban (XARELTO) 20 MG TABS tablet Take 1 tablet (20 mg total) by mouth daily with supper. Patient not taking: Reported on 11/17/2022 09/09/22   Lurene Shadow, MD    Physical Exam: Vitals:   02/06/23 1224 02/06/23 1630 02/06/23 1724 02/06/23 1806  BP: 115/61 124/72    Pulse: 94 83    Resp: 18 (!) 25    Temp: 97.6 F (36.4 C)  98 F (36.7 C)   TempSrc: Oral  Oral   SpO2: 100% (!) 88%  96%  Weight:      Height:       Vitals:   02/06/23 1224 02/06/23 1630 02/06/23 1724 02/06/23 1806  BP: 115/61 124/72    Pulse: 94 83    Resp: 18 (!) 25    Temp:  97.6 F (36.4 C)  98 F (36.7 C)   TempSrc: Oral  Oral   SpO2: 100% (!) 88%  96%  Weight:      Height:       General: Vital signs reviewed.  Patient is a frail and malnourished elderly man, in no acute distress and cooperative with exam.  Head: Normocephalic and atraumatic. Eyes: EOMI, conjunctivae normal, no scleral icterus.  Neck: Supple, trachea midline,  Cardiovascular: Irregularly irregular with murmur Pulmonary/Chest: Decreased breath sound at bases Abdominal: Soft, non-tender, non-distended, BS +, Extremities: No lower extremity edema bilaterally,  pulses symmetric and intact bilaterally.  Neurological: A&O to self only,, Strength is normal and symmetric bilaterally, cranial nerve II-XII are grossly intact, no focal motor deficit, sensory intact to light touch bilaterally.  Psychiatric: Normal mood and affect.   Data Reviewed: Prior data reviewed as mentioned above.  Assessment and Plan: * Acute on chronic respiratory failure with hypoxia (HCC) Likely multifactorial, with concern of pneumonia, CHF and worsening pleural effusion. uses 2 L of oxygen at baseline, currently on 4 L of oxygen. -Continue with supplemental oxygen-wean to baseline as tolerated  Hypotension -Continue home  midodrine  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 heart failure) (HCC) Worsening pleural effusion and pulmonary vascular congestion on imaging.  BNP elevated at 907. Prior echocardiogram was normal. -IV Lasix 40 mg daily -Strict intake and output -Daily weight and BMP  CAP (community acquired pneumonia) Chest x-ray with increasing left-sided pleural effusion and infiltrate. Persistent right sided pleural effusion and infiltrate, unchanged. Procalcitonin elevated at 1.90 -Started him on ceftriaxone and Zithromax  Pleural effusion on right Patient with chronic right-sided pleural effusion and little worsening of left-sided pleural effusion.  History of lung malignancy. Thoracentesis was offered-family will discuss among themselves and let us know.  Iron deficiency anemia -Continue home iron supplement  Chronic atrial fibrillation (HCC) Rate controlled.  Apparently Xarelto was discontinued. -Continue with metoprolol  Dyslipidemia -Continue statin for now, hepatic function pending  Hypothyroidism (acquired) Check TSH -Continue home Synthroid  Cirrhosis (HCC) Patient also has an history of liver malignancy. Not on any active treatment. -Hepatic function panel -Continue spironolactone and Lasix    Advance Care Planning:   Code Status: DNR discussed with daughter  Consults: None  Family Communication: Discussed with daughter  Severity of Illness: The appropriate patient status for this patient is INPATIENT. Inpatient status is judged to be reasonable and necessary in order to provide the required intensity of service to ensure the patient's safety. The patient's presenting symptoms, physical exam findings, and initial radiographic and laboratory data in the context of their chronic comorbidities is felt to place them at high risk for further clinical deterioration. Furthermore, it is not anticipated that the patient will be medically stable for discharge from the hospital  within 2 midnights of admission.   * I certify that at the point of admission it is my clinical judgment that the patient will require inpatient hospital care spanning beyond 2 midnights from the point of admission due to high intensity of service, high risk for further deterioration and high frequency of surveillance required.*  This record has been created using Conservation officer, historic buildings. Errors have been sought and corrected,but may not always be located. Such creation errors do not reflect on the standard of care.   Author: Arnetha Courser, MD 02/06/2023 6:09 PM  For on call review [email or web address].

## 2023-02-06 NOTE — ED Notes (Signed)
Transported to room with transport on the cardiac monitoring

## 2023-02-06 NOTE — Assessment & Plan Note (Signed)
Patient with chronic right-sided pleural effusion and little worsening of left-sided pleural effusion.  History of lung malignancy. Thoracentesis was offered-family will discuss among themselves and let us know.

## 2023-02-06 NOTE — Assessment & Plan Note (Signed)
Chest x-ray with increasing left-sided pleural effusion and infiltrate. Persistent right sided pleural effusion and infiltrate, unchanged. Procalcitonin elevated at 1.90 -Started him on ceftriaxone and Zithromax

## 2023-02-06 NOTE — ED Provider Notes (Signed)
[facility] Provider Note    Event Date/Time   First MD Initiated Contact with Patient 02/06/23 1502     (approximate)   History   Chief Complaint Weakness   HPI  Louis Sanchez is a 82 y.o. male with past medical history of hypertension, atrial fibrillation on Xarelto, CHF, cirrhosis, and hypotension on midodrine who presents to the ED complaining of weakness.  Per daughter-in-law at bedside, patient has been increasingly weak and confused over the past couple of days.  He has not wanted to get up out of bed or eat anything, when he typically gets around well with his walker.  He has also been more disoriented and she has noticed a cough.  Per EMS, patient noted to be hypoxic on room air, now improved on 2 L nasal cannula.  Family is not aware of any recent fevers or sick contacts, patient reportedly tested negative for COVID earlier this week.  Patient currently denies any pain, specifically denies chest pain.     Physical Exam   Triage Vital Signs: ED Triage Vitals  Encounter Vitals Group     BP 02/06/23 1224 115/61     Systolic BP Percentile --      Diastolic BP Percentile --      Pulse Rate 02/06/23 1224 94     Resp 02/06/23 1224 18     Temp 02/06/23 1224 97.6 F (36.4 C)     Temp Source 02/06/23 1224 Oral     SpO2 02/06/23 1224 100 %     Weight 02/06/23 1223 115 lb 1.3 oz (52.2 kg)     Height 02/06/23 1223 5\' 6"  (1.676 m)     Head Circumference --      Peak Flow --      Pain Score --      Pain Loc --      Pain Education --      Exclude from Growth Chart --     Most recent vital signs: Vitals:   02/06/23 1224 02/06/23 1630  BP: 115/61 124/72  Pulse: 94 83  Resp: 18 (!) 25  Temp: 97.6 F (36.4 C)   SpO2: 100% (!) 88%    Constitutional: Alert and oriented to person and place, but not time or situation. Eyes: Conjunctivae are normal. Head: Atraumatic. Nose: No congestion/rhinnorhea. Mouth/Throat: Mucous membranes are moist.   Cardiovascular: Normal rate, irregularly irregular rhythm. Grossly normal heart sounds.  2+ radial pulses bilaterally. Respiratory: Mildly tachypneic with normal respiratory effort.  No retractions. Lung sounds diminished to bilateral bases. Gastrointestinal: Soft and nontender. No distention. Musculoskeletal: No lower extremity tenderness nor edema.  Neurologic:  Normal speech and language. No gross focal neurologic deficits are appreciated.    ED Results / Procedures / Treatments   Labs (all labs ordered are listed, but only abnormal results are displayed) Labs Reviewed  BASIC METABOLIC PANEL - Abnormal; Notable for the following components:      Result Value   Sodium 134 (*)    Glucose, Bld 109 (*)    Calcium 8.3 (*)    All other components within normal limits  CBC - Abnormal; Notable for the following components:   RBC 4.20 (*)    Hemoglobin 10.5 (*)    HCT 38.6 (*)    MCH 25.0 (*)    MCHC 27.2 (*)    RDW 22.5 (*)    All other components within normal limits  URINALYSIS, ROUTINE W REFLEX MICROSCOPIC - Abnormal; Notable for  the following components:   Color, Urine YELLOW (*)    APPearance CLEAR (*)    Protein, ur 30 (*)    Bacteria, UA RARE (*)    All other components within normal limits  BRAIN NATRIURETIC PEPTIDE - Abnormal; Notable for the following components:   B Natriuretic Peptide 907.4 (*)    All other components within normal limits  RESP PANEL BY RT-PCR (RSV, FLU A&B, COVID)  RVPGX2  PROCALCITONIN  CBG MONITORING, ED  TROPONIN I (HIGH SENSITIVITY)     EKG  ED ECG REPORT I, Chesley Noon, the attending physician, personally viewed and interpreted this ECG.   Date: 02/06/2023  EKG Time: 12:31  Rate: 79  Rhythm: atrial fibrillation  Axis: RAD  Intervals:none  ST&T Change: None  RADIOLOGY Chest x-ray reviewed and interpreted by me with bilateral effusions and diffuse edema.  PROCEDURES:  Critical Care performed: Yes, see critical care  procedure note(s)  .Critical Care  Performed by: Chesley Noon, MD Authorized by: Chesley Noon, MD   Critical care provider statement:    Critical care time (minutes):  30   Critical care time was exclusive of:  Separately billable procedures and treating other patients and teaching time   Critical care was necessary to treat or prevent imminent or life-threatening deterioration of the following conditions:  Respiratory failure   Critical care was time spent personally by me on the following activities:  Development of treatment plan with patient or surrogate, discussions with consultants, evaluation of patient's response to treatment, examination of patient, ordering and review of laboratory studies, ordering and review of radiographic studies, ordering and performing treatments and interventions, pulse oximetry, re-evaluation of patient's condition and review of old charts   I assumed direction of critical care for this patient from another provider in my specialty: no     Care discussed with: admitting provider      MEDICATIONS ORDERED IN ED: Medications  cefTRIAXone (ROCEPHIN) 1 g in sodium chloride 0.9 % 100 mL IVPB (has no administration in time range)  azithromycin (ZITHROMAX) 500 mg in sodium chloride 0.9 % 250 mL IVPB (has no administration in time range)  furosemide (LASIX) injection 80 mg (80 mg Intravenous Given 02/06/23 1557)     IMPRESSION / MDM / ASSESSMENT AND PLAN / ED COURSE  I reviewed the triage vital signs and the nursing notes.                              82 y.o. male with past medical history of hypertension, atrial fibrillation on Xarelto, CHF, cirrhosis, and hypotension on midodrine who presents to the ED complaining of generalized weakness, confusion, cough, and hypoxia noted at his nursing facility.  Patient's presentation is most consistent with acute presentation with potential threat to life or bodily function.  Differential diagnosis includes, but  is not limited to, CHF, COPD, pneumonia, ACS, PE, sepsis, AKI, electrolyte abnormality, anemia.  Patient chronically ill but nontoxic-appearing and in no acute distress.  Patient trialed on room air and noted to drop oxygen saturations to 86%, was replaced on 2 L nasal cannula with improvement.  Chest x-ray shows right-sided effusion that is similar to previous but increased effusion on the left with pulmonary vascular congestion.  Suspect symptoms are due to CHF and we will diurese with IV Lasix.  No significant anemia, leukocytosis, tract abnormality, or AKI.  Chest x-ray does question atelectasis versus infiltrate, symptoms seem less likely to be  related to pneumonia and we will hold off on antibiotics for now.  Procalcitonin pending at this time, will also add on troponin and BNP.  With his disorientation, we will check CT head and urinalysis.  CT head is negative for acute process, BNP elevated consistent with CHF, procalcitonin elevated raising concern for pneumonia.  We will start on IV Rocephin and azithromycin.  Case discussed with hospitalist for admission.      FINAL CLINICAL IMPRESSION(S) / ED DIAGNOSES   Final diagnoses:  Acute respiratory failure with hypoxia (HCC)  Pneumonia of both lower lobes due to infectious organism  Pleural effusion  Acute on chronic congestive heart failure, unspecified heart failure type (HCC)     Rx / DC Orders   ED Discharge Orders     None        Note:  This document was prepared using Dragon voice recognition software and may include unintentional dictation errors.   Chesley Noon, MD 02/06/23 [phone]

## 2023-02-06 NOTE — Assessment & Plan Note (Signed)
Likely multifactorial, with concern of pneumonia, CHF and worsening pleural effusion. uses 2 L of oxygen at baseline, currently on 4 L of oxygen. -Continue with supplemental oxygen-wean to baseline as tolerated

## 2023-02-06 NOTE — ED Triage Notes (Signed)
Arrives from [facility] via [employer].  Per EMS report, patient with cough, weakness and new oxygen requirement.

## 2023-02-06 NOTE — Assessment & Plan Note (Signed)
 -   Continue home iron supplement 

## 2023-02-07 ENCOUNTER — Inpatient Hospital Stay: Payer: Medicare Other

## 2023-02-07 DIAGNOSIS — D509 Iron deficiency anemia, unspecified: Secondary | ICD-10-CM

## 2023-02-07 DIAGNOSIS — E039 Hypothyroidism, unspecified: Secondary | ICD-10-CM

## 2023-02-07 DIAGNOSIS — I5033 Acute on chronic diastolic (congestive) heart failure: Secondary | ICD-10-CM

## 2023-02-07 DIAGNOSIS — I959 Hypotension, unspecified: Secondary | ICD-10-CM

## 2023-02-07 DIAGNOSIS — J9621 Acute and chronic respiratory failure with hypoxia: Secondary | ICD-10-CM

## 2023-02-07 DIAGNOSIS — J189 Pneumonia, unspecified organism: Secondary | ICD-10-CM | POA: Diagnosis not present

## 2023-02-07 DIAGNOSIS — I482 Chronic atrial fibrillation, unspecified: Secondary | ICD-10-CM

## 2023-02-07 DIAGNOSIS — J90 Pleural effusion, not elsewhere classified: Secondary | ICD-10-CM

## 2023-02-07 DIAGNOSIS — R188 Other ascites: Secondary | ICD-10-CM

## 2023-02-07 DIAGNOSIS — K7460 Unspecified cirrhosis of liver: Secondary | ICD-10-CM

## 2023-02-07 LAB — CBC
HCT: 36.6 % — ABNORMAL LOW (ref 39.0–52.0)
Hemoglobin: 10.8 g/dL — ABNORMAL LOW (ref 13.0–17.0)
MCH: 25.4 pg — ABNORMAL LOW (ref 26.0–34.0)
MCHC: 29.5 g/dL — ABNORMAL LOW (ref 30.0–36.0)
MCV: 85.9 fL (ref 80.0–100.0)
Platelets: 172 10*3/uL (ref 150–400)
RBC: 4.26 MIL/uL (ref 4.22–5.81)
RDW: 21.7 % — ABNORMAL HIGH (ref 11.5–15.5)
WBC: 7.8 10*3/uL (ref 4.0–10.5)
nRBC: 0 % (ref 0.0–0.2)

## 2023-02-07 LAB — COMPREHENSIVE METABOLIC PANEL
ALT: 27 U/L (ref 0–44)
AST: 39 U/L (ref 15–41)
Albumin: 2.8 g/dL — ABNORMAL LOW (ref 3.5–5.0)
Alkaline Phosphatase: 177 U/L — ABNORMAL HIGH (ref 38–126)
Anion gap: 10 (ref 5–15)
BUN: 20 mg/dL (ref 8–23)
CO2: 32 mmol/L (ref 22–32)
Calcium: 8.2 mg/dL — ABNORMAL LOW (ref 8.9–10.3)
Chloride: 95 mmol/L — ABNORMAL LOW (ref 98–111)
Creatinine, Ser: 0.92 mg/dL (ref 0.61–1.24)
GFR, Estimated: >60 mL/min (ref >60)
Glucose, Bld: 84 mg/dL (ref 70–99)
Potassium: 3.5 mmol/L (ref 3.5–5.1)
Sodium: 137 mmol/L (ref 135–145)
Total Bilirubin: 1.1 mg/dL (ref 0.3–1.2)
Total Protein: 7.3 g/dL (ref 6.5–8.1)

## 2023-02-07 LAB — STREP PNEUMONIAE URINARY ANTIGEN: Strep Pneumo Urinary Antigen: NEGATIVE

## 2023-02-07 LAB — GLUCOSE, CAPILLARY: Glucose-Capillary: 80 mg/dL (ref 70–99)

## 2023-02-07 LAB — PROTIME-INR
INR: 1.5 — ABNORMAL HIGH (ref 0.8–1.2)
Prothrombin Time: 18.1 seconds — ABNORMAL HIGH (ref 11.4–15.2)

## 2023-02-07 MED ORDER — VITAMIN C 500 MG PO TABS
500.0000 mg | ORAL_TABLET | Freq: Two times a day (BID) | ORAL | Status: DC
  Administered 2023-02-07 – 2023-02-11 (×8): 500 mg via ORAL
  Filled 2023-02-07 (×8): qty 1

## 2023-02-07 MED ORDER — MORPHINE SULFATE (PF) 2 MG/ML IV SOLN
1.0000 mg | Freq: Once | INTRAVENOUS | Status: AC
  Administered 2023-02-07: 1 mg via INTRAVENOUS
  Filled 2023-02-07: qty 1

## 2023-02-07 MED ORDER — ZINC SULFATE 220 (50 ZN) MG PO CAPS
220.0000 mg | ORAL_CAPSULE | Freq: Every day | ORAL | Status: DC
  Administered 2023-02-07 – 2023-02-11 (×5): 220 mg via ORAL
  Filled 2023-02-07 (×5): qty 1

## 2023-02-07 MED ORDER — PHENOL 1.4 % MT LIQD
1.0000 | OROMUCOSAL | Status: DC | PRN
  Administered 2023-02-08: 1 via OROMUCOSAL
  Filled 2023-02-07: qty 177

## 2023-02-07 MED ORDER — FUROSEMIDE 10 MG/ML IJ SOLN
40.0000 mg | Freq: Once | INTRAMUSCULAR | Status: DC
  Filled 2023-02-07: qty 4

## 2023-02-07 MED ORDER — LEVOTHYROXINE SODIUM 100 MCG PO TABS
200.0000 ug | ORAL_TABLET | Freq: Every day | ORAL | Status: DC
  Administered 2023-02-08 – 2023-02-11 (×4): 200 ug via ORAL
  Filled 2023-02-07 (×4): qty 2

## 2023-02-07 MED ORDER — ENSURE ENLIVE PO LIQD
237.0000 mL | Freq: Two times a day (BID) | ORAL | Status: DC
  Administered 2023-02-07 – 2023-02-08 (×2): 237 mL via ORAL

## 2023-02-07 NOTE — Progress Notes (Signed)
Progress Note   Patient: Louis Sanchez:[id number] DOB: 08/21/1940 DOA: 02/06/2023     1 DOS: the patient was seen and examined on 02/07/2023   Brief hospital course: ANSHUL MEDDINGS is a 82 y.o. 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  ROS was also positive for poor p.o. intake, decreased appetite and worsening confusion.  ED course and data reviewed.  Afebrile with mild tachypnea and desaturated up to 88% on 2 L of oxygen, saturation improved on 4 L.  Labs pertinent for procalcitonin of 1.90, BNP 907, CBC and BMP stable. CT head was without any acute abnormality. CXR with persistent right pleural effusion with right lower lobe infiltrate, unchanged from prior studies.  Increasing left pleural effusion with left lower lobe in filtrate versus atelectasis. Also showed cardiomegaly with vascular congestion.   EKG.  Personally reviewed.  Atrial fibrillation, heart rate of 80 and right axis deviation.   Patient received IV Lasix, ceftriaxone and Zithromax in ED.  7/16: Vital stable, currently on 3 L, UOP of 3200 recorded.  Strep pneumo negative. Multiple pressure injuries-no sign of infection, wound care was consulted. Thoracentesis was ordered after discussing with son. Significantly elevated TSH at 39, Synthroid dose was increased to 200 mcg-patient will need a repeat TSH in 3 to 4 weeks by PCP.   Assessment and Plan: * Acute on chronic respiratory failure with hypoxia (HCC) Likely multifactorial, with concern of pneumonia, CHF and worsening pleural effusion. uses 2 L of oxygen at baseline, currently on 3 L of oxygen. -Continue with supplemental oxygen-wean to baseline as  tolerated  Hypotension -Continue home midodrine  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 heart failure) (HCC) Worsening pleural effusion and pulmonary vascular congestion on imaging.  BNP elevated at 907. Prior echocardiogram was normal. -IV Lasix 40 mg daily -Strict intake and output -Daily weight and BMP  Pneumonia of both lower lobes due to infectious organism Chest x-ray with increasing left-sided pleural effusion and infiltrate. Persistent right sided pleural effusion and infiltrate, unchanged. Procalcitonin elevated at 1.90 -Started him on ceftriaxone and Zithromax  Pleural effusion Patient with chronic right-sided pleural effusion and little worsening of left-sided pleural effusion.  History of lung malignancy. -Ordered thoracentesis after discussing with son on phone. -Follow-up pleural fluid labs  Iron deficiency anemia -Continue home iron supplement  Chronic atrial fibrillation (HCC) Rate controlled.  Apparently Xarelto was discontinued. -Continue with metoprolol  Dyslipidemia -Continue statin for now, hepatic function pending  Hypothyroidism (acquired) Significantly elevated TSH at 39. -Increasing the dose of Synthroid to 200 mcg.  Patient need a repeat TSH in 3 to 4 weeks by PCP  Cirrhosis [facility]) Patient also has an history of liver malignancy. Not on any active treatment. -Hepatic function panel -Continue spironolactone and Lasix      Subjective: Patient was eating breakfast when seen today.  Denies any pain.  Oriented to self only.  Physical Exam: Vitals:   02/07/23 0056 02/07/23 0556 02/07/23 0739 02/07/23 1130  BP: 111/75 111/76 111/76 113/73  Pulse: 84 79 77 100  Resp: 18 18 17 18   Temp: 98.3 F (36.8 C) (!) 97.4 F (36.3 C) (!) 97.5 F (36.4 C) (!) 97.3 F (36.3 C)  TempSrc: Oral Oral  Oral  SpO2:  100% 100% 98% 100%  Weight:      Height:       General.  Frail and severely malnourished gentleman, in no acute distress. Pulmonary.   Decreased breath sound at bases, normal respiratory effort. CV.  Regular rate and rhythm, no JVD, rub or murmur. Abdomen.  Soft, nontender, nondistended, BS positive. CNS.  Alert and oriented to self.  No focal neurologic deficit. Extremities.  No edema, no cyanosis, pulses intact and symmetrical. Psychiatry.  Appears to have some cognitive impairment  Data Reviewed: Prior data reviewed  Family Communication: Discussed with son on phone  Disposition: Status is: Inpatient Remains inpatient appropriate because: Severity of illness  Planned Discharge Destination: Skilled nursing facility  DVT prophylaxis.  Lovenox Time spent: 50 minutes  This record has been created using Conservation officer, historic buildings. Errors have been sought and corrected,but may not always be located. Such creation errors do not reflect on the standard of care.   Author: Arnetha Courser, MD 02/07/2023 1:52 PM  For on call review [email or web address].

## 2023-02-07 NOTE — Progress Notes (Signed)
Initial Nutrition Assessment  DOCUMENTATION CODES:   Severe malnutrition in context of chronic illness  INTERVENTION:   -Ensure Enlive po BID, each supplement provides 350 kcal and 20 grams of protein -MVI with minerals daily -Liberalize diet to regular for wider variety of meal selections -500 mg vitamin C BID -220 mg zinc sulfate daily x 14 daily  NUTRITION DIAGNOSIS:   Severe Malnutrition related to chronic illness (CHF) as evidenced by severe fat depletion, severe muscle depletion, percent weight loss.  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  Pt admitted with acute respiratory failure with hypoxia secondary to pneumonia, CHF, and bilateral pleural effusions.   Reviewed I/O's: -2.8 L x 24 hours  UOP: 3.2 L x 24 hours   Per CWOCN notes, pt with DTI to upper middle back, stage 2 pressure injury to middle back, and partial thickness wound to back.   Spoke with pt and family members at bedside. Pt reports feeling much better since admission. Pt reports he has a great appetite; he consumed a fruit place for lunch today. Pt family reports that he has been residing at [facility] for the past 6 months and eats well, 3 meals per day consistent of meat, starch, and vegetable. Family shares that pt usually eats very well' "he eats whatever and still loses weight".   Pt UBW is around 155-160#. Per family, pt was 160# when he transferred to [facility] back in December 2023 and has lost  weight progressively since. Reviewed wt hx; pt has experienced 18.5% wt loss over the past 6 months, which is significant for time frame.    Discussed importance of good meal and supplement intake to promote healing. Pt amenable to supplements.   Medications reviewed and include ferrous sulfate, lasix, potassium chloride, and aldactone.   No results found for: "HGBA1C" PTA DM medications are none.   Labs reviewed: CBGS: 80 (inpatient orders for glycemic control are none).    NUTRITION - FOCUSED PHYSICAL EXAM:  Flowsheet Row Most Recent Value  Orbital Region Severe depletion  Upper Arm Region Severe depletion  Thoracic and Lumbar Region Severe depletion  Buccal Region Severe depletion  Temple Region Severe depletion  Clavicle Bone Region Severe depletion  Clavicle and Acromion Bone Region Severe depletion  Scapular Bone Region Severe depletion  Dorsal Hand Severe depletion  Patellar Region Severe depletion  Anterior Thigh Region Severe depletion  Posterior Calf Region Severe depletion  Edema (RD Assessment) None  Hair Reviewed  Eyes Reviewed  Mouth Reviewed  Skin Reviewed  Nails Reviewed       Diet Order:   Diet Order             Diet regular Fluid consistency: Thin  Diet effective now                   EDUCATION NEEDS:   Education needs have been addressed  Skin:  Skin Assessment: Skin Integrity Issues: Skin Integrity Issues:: DTI, Stage II, Other (Comment) DTI: upper middle back Stage II: middle back Other:  partial thickness wound to back  Last BM:  Unknown  Height:   Ht Readings from Last 1 Encounters:  02/06/23 5\' 6"  (1.676 m)    Weight:   Wt Readings from Last 1 Encounters:  02/06/23 54.9 kg    Ideal Body Weight:  64.5 kg  BMI:  Body mass index is 19.54 kg/m.  Estimated Nutritional Needs:   Kcal:  1650-1850  Protein:  85-100 grams  Fluid:  > 1.6 L    Levada Schilling, RD, LDN, CDCES Registered Dietitian II Certified Diabetes Care  and Education Specialist Please refer to [facility] for RD and/or RD on-call/weekend/after hours pager

## 2023-02-07 NOTE — Consult Note (Signed)
WOC Nurse Consult Note: Reason for Consult: Consult requested for several wounds to the back. Pt is very emaciated with protruding bones to sacrum/spine/bilat scapula.  He states he had previous falls prior to admission. Wound type: Upper middle back with dark red-purple Deep tissue pressure injury; .8X.8cm Middle back with red dry Stage 2 pressure injury Scattered areas of partial thickness wounds across bilat middle back; right side .5X.5X.2cm, red and moist, left side 2X1X.2cm, red and dry Sacrum and bilat buttocks with intact skin. Pressure Injury POA: Yes Dressing procedure/placement/frequency: Topical treatment orders provided for bedside nurses to perform as follows: Foam dressing to back wounds; change Q 3 days or PRN soiling. Please re-consult if further assistance is needed.  Thank-you,  Cammie Mcgee MSN, RN, CWOCN, [place], CNS [phone]

## 2023-02-07 NOTE — Hospital Course (Addendum)
Louis Sanchez is a 82 y.o. 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  ROS was also positive for poor p.o. intake, decreased appetite and worsening confusion.  ED course and data reviewed.  Afebrile with mild tachypnea and desaturated up to 88% on 2 L of oxygen, saturation improved on 4 L.  Labs pertinent for procalcitonin of 1.90, BNP 907, CBC and BMP stable. CT head was without any acute abnormality. CXR with persistent right pleural effusion with right lower lobe infiltrate, unchanged from prior studies.  Increasing left pleural effusion with left lower lobe in filtrate versus atelectasis. Also showed cardiomegaly with vascular congestion.   EKG.  Personally reviewed.  Atrial fibrillation, heart rate of 80 and right axis deviation.   Patient received IV Lasix, ceftriaxone and Zithromax in ED.  7/16: Vital stable, currently on 3 L, UOP of 3200 recorded.  Strep pneumo negative. Multiple pressure injuries-no sign of infection, wound care was consulted. Thoracentesis was ordered after discussing with son. Significantly elevated TSH at 39, Synthroid dose was increased to 200 mcg-patient will need a repeat TSH in 3 to 4 weeks by PCP.  7/17: Hemodynamically stable this morning, yesterday evening he became short of breath, repeat chest x-ray with improved right pleural effusion, significant right lung volume loss and persistent bilateral basal infiltrate/atelectasis.  No worsening hypoxia.  Received morphine to help. Concern of aspiration-swallow evaluation ordered Thoracentesis was done today-pending labs.  7/18: Currently stable.  Thoracentesis labs are consistent with transudate with preliminary  negative cultures.  Swallow evaluation lead to barium swallow studies which shows oropharyngeal dysphagia with delayed swallow initiation and pharyngeal pooling leading to aspiration on thin liquids.  Swallow team is recommending dysphagia 3 diet with thick liquids. Procalcitonin remained elevated at 1.84, switching ceftriaxone with Unasyn for concern of aspiration pneumonia.  7/19: Remain stable, completed the course of azithromax today, will be discharge on 4 more days of Augmentin and 40 mg of daily lasix. He will continue with rest of his medications and need to follow up with his providers.   Patient is high risk for deterioration and mortality based on advanced age and underlying significant life limiting comorbidities. Patient has significant risk of aspiration, dysphagia 3 diet with thick liquids, along with strict aspiration pre cautions are recommended.Family is aware and understand. If he continue to get worse they will consider comfort care only. Out patient palliative care is recommended and they can switch to Hospice as appropriate.

## 2023-02-07 NOTE — Plan of Care (Signed)
  Problem: Activity: Goal: Ability to tolerate increased activity will improve Outcome: Progressing   Problem: Education: Goal: Knowledge of General Education information will improve Description: Including pain rating scale, medication(s)/side effects and non-pharmacologic comfort measures Outcome: Progressing   Problem: Health Behavior/Discharge Planning: Goal: Ability to manage health-related needs will improve Outcome: Progressing   Problem: Activity: Goal: Risk for activity intolerance will decrease Outcome: Progressing   Problem: Coping: Goal: Level of anxiety will decrease Outcome: Progressing   Problem: Elimination: Goal: Will not experience complications related to bowel motility Outcome: Progressing Goal: Will not experience complications related to urinary retention Outcome: Progressing   Problem: Pain Managment: Goal: General experience of comfort will improve Outcome: Progressing

## 2023-02-08 ENCOUNTER — Inpatient Hospital Stay: Payer: Medicare Other

## 2023-02-08 DIAGNOSIS — J90 Pleural effusion, not elsewhere classified: Secondary | ICD-10-CM | POA: Diagnosis not present

## 2023-02-08 DIAGNOSIS — I959 Hypotension, unspecified: Secondary | ICD-10-CM | POA: Diagnosis not present

## 2023-02-08 DIAGNOSIS — I5033 Acute on chronic diastolic (congestive) heart failure: Secondary | ICD-10-CM | POA: Diagnosis not present

## 2023-02-08 DIAGNOSIS — J9621 Acute and chronic respiratory failure with hypoxia: Secondary | ICD-10-CM | POA: Diagnosis not present

## 2023-02-08 DIAGNOSIS — E43 Unspecified severe protein-calorie malnutrition: Secondary | ICD-10-CM

## 2023-02-08 LAB — PROTEIN, PLEURAL OR PERITONEAL FLUID: Total protein, fluid: <3 g/dL

## 2023-02-08 LAB — BODY FLUID CELL COUNT WITH DIFFERENTIAL
Eos, Fluid: 0 %
Lymphs, Fluid: 6 %
Monocyte-Macrophage-Serous Fluid: 4 %
Neutrophil Count, Fluid: 90 %
Total Nucleated Cell Count, Fluid: 307 cu mm

## 2023-02-08 LAB — LACTATE DEHYDROGENASE, PLEURAL OR PERITONEAL FLUID: LD, Fluid: 77 U/L — ABNORMAL HIGH (ref 3–23)

## 2023-02-08 LAB — BASIC METABOLIC PANEL
Anion gap: 8 (ref 5–15)
BUN: 21 mg/dL (ref 8–23)
CO2: 33 mmol/L — ABNORMAL HIGH (ref 22–32)
Calcium: 8 mg/dL — ABNORMAL LOW (ref 8.9–10.3)
Chloride: 95 mmol/L — ABNORMAL LOW (ref 98–111)
Creatinine, Ser: 0.88 mg/dL (ref 0.61–1.24)
GFR, Estimated: >60 mL/min (ref >60)
Glucose, Bld: 86 mg/dL (ref 70–99)
Potassium: 3.7 mmol/L (ref 3.5–5.1)
Sodium: 136 mmol/L (ref 135–145)

## 2023-02-08 LAB — GLUCOSE, CAPILLARY: Glucose-Capillary: 84 mg/dL (ref 70–99)

## 2023-02-08 LAB — GLUCOSE, PLEURAL OR PERITONEAL FLUID: Glucose, Fluid: 120 mg/dL

## 2023-02-08 LAB — LEGIONELLA PNEUMOPHILA SEROGP 1 UR AG: L. pneumophila Serogp 1 Ur Ag: NEGATIVE

## 2023-02-08 MED ORDER — LIDOCAINE HCL (PF) 1 % IJ SOLN
10.0000 mL | Freq: Once | INTRAMUSCULAR | Status: DC
  Filled 2023-02-08: qty 10

## 2023-02-08 MED ORDER — ENSURE ENLIVE PO LIQD
237.0000 mL | Freq: Three times a day (TID) | ORAL | Status: DC
  Administered 2023-02-09 – 2023-02-11 (×4): 237 mL via ORAL

## 2023-02-08 NOTE — Progress Notes (Signed)
Progress Note   Patient: Louis Sanchez EPP:[id number] DOB: 10-Apr-1941 DOA: 02/06/2023     2 DOS: the patient was seen and examined on 02/08/2023   Brief hospital course: MAZI BRAILSFORD is a 82 y.o. 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  ROS was also positive for poor p.o. intake, decreased appetite and worsening confusion.  ED course and data reviewed.  Afebrile with mild tachypnea and desaturated up to 88% on 2 L of oxygen, saturation improved on 4 L.  Labs pertinent for procalcitonin of 1.90, BNP 907, CBC and BMP stable. CT head was without any acute abnormality. CXR with persistent right pleural effusion with right lower lobe infiltrate, unchanged from prior studies.  Increasing left pleural effusion with left lower lobe in filtrate versus atelectasis. Also showed cardiomegaly with vascular congestion.   EKG.  Personally reviewed.  Atrial fibrillation, heart rate of 80 and right axis deviation.   Patient received IV Lasix, ceftriaxone and Zithromax in ED.  7/16: Vital stable, currently on 3 L, UOP of 3200 recorded.  Strep pneumo negative. Multiple pressure injuries-no sign of infection, wound care was consulted. Thoracentesis was ordered after discussing with son. Significantly elevated TSH at 39, Synthroid dose was increased to 200 mcg-patient will need a repeat TSH in 3 to 4 weeks by PCP.  7/17: Hemodynamically stable this morning, yesterday evening he became short of breath, repeat chest x-ray with improved right pleural effusion, significant right lung volume loss and persistent bilateral basal infiltrate/atelectasis.  No worsening hypoxia.  Received morphine to help. Concern of  aspiration-swallow evaluation ordered Thoracentesis was done today-pending labs.  Patient is high risk for deterioration and mortality based on advanced age and underlying significant life limiting comorbidities.   Assessment and Plan: * Acute on chronic respiratory failure with hypoxia (HCC) Likely multifactorial, with concern of pneumonia, CHF and worsening pleural effusion. uses 2 L of oxygen at baseline, currently on 3 L of oxygen. -Continue with supplemental oxygen-wean to baseline as tolerated  Hypotension -Continue home midodrine  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 heart failure) (HCC) Worsening pleural effusion and pulmonary vascular congestion on imaging.  BNP elevated at 907. Prior echocardiogram was normal. -Continue IV Lasix 40 mg daily-renal functions are stable -Strict intake and output -Daily weight and BMP  Pneumonia of both lower lobes due to infectious organism Chest x-ray with increasing left-sided pleural effusion and infiltrate. Persistent right sided pleural effusion and infiltrate, unchanged. Procalcitonin elevated at 1.90 -Started him on ceftriaxone and Zithromax  Pleural effusion Patient with chronic right-sided pleural effusion and little worsening of left-sided pleural effusion.  History of lung malignancy. -Thoracentesis was done today-pending labs  Iron deficiency anemia -Continue home iron supplement  Chronic atrial fibrillation (HCC) Rate controlled.  Apparently Xarelto was discontinued. -Continue with metoprolol  Dyslipidemia -Continue statin for now, hepatic function pending  Hypothyroidism (acquired) Significantly elevated TSH at 39. -Increasing the dose of Synthroid to 200 mcg.  Patient need a repeat TSH in 3 to 4 weeks by PCP  Cirrhosis [facility]) Patient also has an history of liver malignancy. Not on any active treatment. -Hepatic function panel -Continue spironolactone and Lasix      Subjective: Patient seems comfortable  and denies any pain when seen today.  Yesterday evening some concern of worsening shortness of breath which improved with morphine injection.  Repeat chest x-ray seems stable.  Physical Exam: Vitals:   02/08/23 0617 02/08/23 0810 02/08/23 1216 02/08/23 1300  BP: (!) 102/56 100/87 93/61 (!) 98/56  Pulse: 76 72 66 77  Resp: 18 14    Temp: (!) 97.5 F (36.4 C) (!) 97.4 F (36.3 C)    TempSrc: Oral Oral    SpO2: 98% 98% 99% 98%  Weight:      Height:       General.  Frail and malnourished elderly man, in no acute distress. Pulmonary.  Bibasilar decreased breath sounds, normal respiratory effort. CV.  Regular rate and rhythm, no JVD, rub or murmur. Abdomen.  Soft, nontender, nondistended, BS positive. CNS.  Alert and oriented .  No focal neurologic deficit. Extremities.  No edema, no cyanosis, pulses intact and symmetrical. Psychiatry.  Judgment and insight appears impaired.   Data Reviewed: Prior data reviewed  Family Communication: Discussed with son on phone  Disposition: Status is: Inpatient Remains inpatient appropriate because: Severity of illness  Planned Discharge Destination: Skilled nursing facility  DVT prophylaxis.  Lovenox Time spent: 45 minutes  This record has been created using Conservation officer, historic buildings. Errors have been sought and corrected,but may not always be located. Such creation errors do not reflect on the standard of care.   Author: Arnetha Courser, MD 02/08/2023 3:53 PM  For on call review [email or web address].

## 2023-02-08 NOTE — Progress Notes (Signed)
Nutrition Follow-up  DOCUMENTATION CODES:   Severe malnutrition in context of chronic illness  INTERVENTION:   -Increase Ensure Enlive po to TID, each supplement provides 350 kcal and 20 grams of protein -Magic cup TID with meals, each supplement provides 290 kcal and 9 grams of protein  -Continue MVI with minerals daily -Continue dysphagia 3 diet  -Continue 500 mg vitamin C BID -Continue 220 mg zinc sulfate daily x 14 daily  NUTRITION DIAGNOSIS:   Severe Malnutrition related to chronic illness (CHF) as evidenced by severe fat depletion, severe muscle depletion, percent weight loss.  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  7/17- s/p Ultrasound guided left thoracentesis (500 ml removed); s/p BSE- downgraded to dysphagia 3 diet  Reviewed I/O's: -640 ml x 24 hours and -3.5 L since admission  UOP: 1 L x 24 hours   Pt seen yesterday. He was downgraded to a dysphagia 3 diet with thin liquids from a regular diet by SLP. No meal completions documented today. He is drinking Ensure supplements.   No wt loss since admission.   Pt residing at [facility].   Medications reviewed and include ferrous sulfate, lasix, neurontin, potassium chloride, and aldactone.  Labs reviewed: CBGS: 84.    Diet Order:   Diet Order             DIET DYS 3 Room service appropriate? Yes with Assist; Fluid consistency: Thin  Diet effective now                   EDUCATION NEEDS:   Education needs  have been addressed  Skin:  Skin Assessment: Skin Integrity Issues: Skin Integrity Issues:: DTI, Stage II, Other (Comment) DTI: upper middle back Stage II: middle back Other: partial thickness wound to back  Last BM:  Unknown  Height:   Ht Readings from Last 1 Encounters:  02/06/23 5\' 6"  (1.676 m)    Weight:   Wt Readings from Last 1 Encounters:  02/08/23 54.3 kg    Ideal Body Weight:  64.5 kg  BMI:  Body mass index is 19.32 kg/m.  Estimated Nutritional Needs:   Kcal:  1650-1850  Protein:  85-100 grams  Fluid:  > 1.6 L    Levada Schilling, RD, LDN, CDCES Registered Dietitian II Certified Diabetes Care and Education Specialist Please refer to [facility] for RD and/or RD on-call/weekend/after hours pager

## 2023-02-08 NOTE — Assessment & Plan Note (Signed)
Likely multifactorial, with concern of pneumonia, CHF and worsening pleural effusion. uses 2 L of oxygen at baseline, currently on 3 L of oxygen. -Continue with supplemental oxygen-wean to baseline as tolerated

## 2023-02-08 NOTE — Assessment & Plan Note (Addendum)
Patient with chronic right-sided pleural effusion and little worsening of left-sided pleural effusion.  History of lung malignancy. -Thoracentesis with removal of 500 cc of transudative fluid, preliminary cultures negative.

## 2023-02-08 NOTE — Procedures (Signed)
Interventional Radiology Procedure Note  Procedure: Ultrasound guided left thoracentesis  Findings: Please refer to procedural dictation for full description. 500 mL translucent, straw colored fluid.  Complications: None immediate  Estimated Blood Loss: < 5 mL  Recommendations: Post thoracentesis CXR ordered.   Marliss Coots, MD

## 2023-02-08 NOTE — Progress Notes (Signed)
Patient received from thoracentesis via bed in stable condition.

## 2023-02-08 NOTE — Assessment & Plan Note (Signed)
Continue home midodrine. 

## 2023-02-08 NOTE — Evaluation (Addendum)
Clinical/Bedside Swallow Evaluation Patient Details  Name: Louis Sanchez MRN: [id number] Date of Birth: 03-08-1941  Today's Date: 02/08/2023 Time: SLP Start Time (ACUTE ONLY): 1045 SLP Stop Time (ACUTE ONLY): 1145 SLP Time Calculation (min) (ACUTE ONLY): 60 min  Past Medical History:  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HCC)    Other and unspecified hyperlipidemia 1997   Other specified disorder of male genital organs(608.89) 08/2012   Sleep disturbance, unspecified    Thyroid disease    Unspecified hypothyroidism 2005   Past Surgical History:  Past Surgical History:  Procedure Laterality Date   APPENDECTOMY  1953   colon polyps  2009   HERNIA REPAIR  1957   Right   INGUINAL HERNIA REPAIR Right 2014   SPINE SURGERY  1997   neck   WRIST SURGERY     HPI:  Pt is a 82 y.o. 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     History was obtained from daughter on phone and according to her patient was not overall feeling well for the past 1 to 2 weeks with decreased appetite and poor p.o. intake, worsening weakness to the point that he was not getting out of bed recently.  Increased cough.  No fever or chills.  No urinary symptoms.  At baseline patient knows about himself and  where he is.  Currently worsening confusion.     Per daughter they were concerned about fluid overload as he exhibited similar symptoms in the past and was diagnosed with CHF exacerbation.   CXR: "persistent right pleural effusion with right lower lobe infiltrate, unchanged from prior studies.  Increasing left pleural effusion with left lower lobe in filtrate versus atelectasis.  Also showed cardiomegaly with vascular congestion.:.    Assessment / Plan / Recommendation  Clinical Impression   Pt seen for BSE today. Pt awake sitting up in bed but benefited from better forward support w/ a Pillow Low behind back(adjusted by this SLP) as he drank some of his Ensure. Pt verbal and followed commands appropriately w/ gentle cue. Pt stated he had some difficulty chewing "tough" foods.  Appears deconditioned/malnourished. Slow movements. Pt has a Baseline of Dementia per MD note. On [place] O2 3L(2L at home); WBC WNL; afebrile.   Pt appears to present w/ suspected pharyngeal phase(possible pharyngoesophageal phase) dysphagia w/ potential impact from significantly declined Pulmonary status. Pt consumed po trials w/ intermittent, overt clinical s/s of aspiration during po intake. He appears at risk for aspiration/aspiration pneumonia in current presentation.  Pt is pending a thoracentesis today d/t increased pleural fluid, CHF per MD note.    Pt has several challenging factors that could impact oropharyngeal swallowing to include Severe Malnutrition per Dietician, Dementia(per chart), generalized weakness/deconditioning,  GERD, Cancer, and Chronic cardiopulmonary deficits including R lower lobectomy and CHF. Chronic illness has been documented per chart. These factors can increase risk for aspiration, dysphagia as well as decreased oral intake overall. ANY Esophageal phase Dysmotility can impact pharyngeal phase of swallowing and can increase risk for aspiration of the Reflux material during Retrograde flow thus impact  Pulmonary status.   During po trials, pt consumed all consistencies w/ inconsistent, overt coughing -- often a mild hacking type cough. No decline in vocal quality nor change in respiratory presentation during/post trials though rest breaks were taken b/t trials, and pt fed himself Slowly. O2 sats remained 96% when checked. Oral phase appeared grossly [facility] w/ timely bolus management, mastication, and control of bolus propulsion for A-P transfer for swallowing. Oral clearing achieved w/ all trial consistencies -- moistened, soft foods given. Rest Breaks encouraged to lessen any WOB d/t baseline Pulmonary status. Fairly frequent Belching occurred - Baseline for pt. Education given on moistening foods for ease of mastication and Rest Breaks for conservation of energy.  OM Exam appeared [facility] w/ no unilateral weakness noted. Speech Clear. Pt fed self w/ min setup support.   Recommend current diet of Mech soft consistency diet w/ well-Cut meats, moistened foods; Thin liquids -- carefully monitor drinking utilizing Small sips, Slowly. Pt requests to use a straw when drinking but this should be monitored and Cup drinking encouraged if increased coughing noted w/ straw use.  Recommend general aspiration precautions including sitting fully upright in bed; Rest Breaks for conservation of energy. GERD/REFLUX precautions baseline. Pills WHOLE in Puree for safer, easier swallowing -- pt has done this previously and was encouraged to do so now and for D/C.  D/t pt's presentation at this eval, recommend a MBSS tomorrow for objective assessment of swallowing; recommendations.  Recommend a Palliative Care consult for GOC discussion d/t chronic illness and dxs.  Education given on Pills in Puree; food consistencies and easy to eat options; general aspiration precautions to pt and NSG. MD updated, agreed. Recommended Dietician f/u for support. SLP Visit Diagnosis: Dysphagia, pharyngoesophageal phase (R13.14) (Baseline GERD;  poor Pulmonary status)    Aspiration Risk  Mild aspiration risk;Risk for inadequate nutrition/hydration    Diet Recommendation   Thin;Dysphagia 3 (mechanical soft) = Mech soft consistency diet w/ well-Cut meats, moistened foods; Thin liquids -- carefully monitor drinking utilizing Small sips, Slowly. Pt requests to use a straw when drinking but this should be monitored and Cup drinking encouraged if increased coughing noted w/ straw use.  Recommend general aspiration precautions including sitting fully upright in bed; Rest Breaks for conservation of energy. GERD/REFLUX precautions baseline.  Medication Administration: Whole meds with puree    Other  Recommendations Recommended Consults:  (Dietician f/u; Palliative Care f/u for GOC) Oral Care Recommendations: Oral care BID;Oral care before and after PO;Patient independent with oral care (w/ setup)    Recommendations for follow up therapy are one component of a multi-disciplinary discharge planning process, led by the attending physician.  Recommendations may be updated based on patient status, additional functional criteria and insurance authorization.  Follow up Recommendations Follow physician's recommendations for discharge plan and follow up therapies      Assistance Recommended at Discharge  Frequent/full  Functional Status Assessment Patient has had a recent decline in their functional status and/or demonstrates limited ability to make significant improvements in function in a reasonable and predictable amount of time  Frequency and Duration min 2x/week  2 weeks  Prognosis Prognosis for improved oropharyngeal function: Fair Barriers to Reach Goals: Cognitive deficits;Language deficits;Time post onset;Severity of deficits Barriers/Prognosis Comment: baseline Pulmonary decline, GERD, Cognitive decline      Swallow Study   General Date of Onset: 02/06/23 HPI: Pt is a 82 y.o. 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     History was obtained from daughter on phone and according to her patient was not overall feeling well for the past 1 to 2 weeks with decreased appetite and poor p.o. intake, worsening weakness to the point that he was not getting out of bed recently.  Increased cough.  No fever or chills.  No urinary symptoms.  At baseline patient knows about himself and where he is.  Currently worsening confusion.     Per daughter they were concerned about fluid overload as he exhibited similar symptoms in the past and was diagnosed with CHF exacerbation.   CXR: "persistent right pleural effusion with right lower lobe infiltrate, unchanged from prior studies.  Increasing left pleural effusion with left lower lobe in filtrate versus atelectasis.  Also showed cardiomegaly with vascular congestion.:. Type of Study: Bedside Swallow Evaluation Previous Swallow Assessment: 08/2022 - mech soft, thins Diet Prior to this Study: Regular;Thin liquids (Level 0) Temperature Spikes Noted: No (wbc 7.8) Respiratory Status: Nasal cannula (3L (2L at home)) History of Recent Intubation: No Behavior/Cognition: Alert;Cooperative;Pleasant mood;Distractible;Requires cueing Oral Cavity Assessment: Within Functional Limits Oral Care Completed by SLP: Yes Oral Cavity - Dentition: Adequate natural dentition Vision: Functional for self-feeding Self-Feeding Abilities: Able to feed self;Needs set up;Needs assist Patient Positioning: Upright in bed (needed positioning support) Baseline Vocal Quality: Normal;Low vocal intensity Volitional Cough: Strong Volitional Swallow: Able to elicit    Oral/Motor/Sensory  Function Overall Oral Motor/Sensory Function: Within functional limits   Ice Chips Ice chips: Within functional limits Presentation: Spoon (fed; 2 trials)   Thin Liquid Thin Liquid: Impaired Presentation: Cup;Self Fed;Straw (pt prefers to use a straw per his report; drank coffee from cup) Oral Phase Impairments:  (wfl) Pharyngeal  Phase Impairments: Cough - Immediate;Cough - Delayed (intermittently x4-5 w/ ~3-4 ozs total) Other Comments: water, coffee    Nectar Thick Nectar Thick Liquid: Not tested   Honey Thick Honey Thick Liquid: Not tested   Puree Puree: Impaired Presentation: Spoon;Self Fed (7 trials) Oral Phase Impairments:  (wfl) Pharyngeal Phase Impairments: Cough - Delayed (x1)   Solid     Solid: Impaired Presentation: Spoon (fed; 7 trials) Oral Phase Impairments:  (wfl) Oral Phase Functional Implications:  (took his time) Pharyngeal Phase Impairments: Cough - Delayed (x1)       Jerilynn Som, MS, CCC-SLP Speech Language Pathologist Rehab Services; [facility] [place] [phone] (ascom) Arleta Ostrum 02/08/2023,2:36 PM

## 2023-02-08 NOTE — Assessment & Plan Note (Signed)
Worsening pleural effusion and pulmonary vascular congestion on imaging.  BNP elevated at 907. Prior echocardiogram was normal. -Continue IV Lasix 40 mg daily-renal functions are stable -Strict intake and output -Daily weight and BMP

## 2023-02-08 NOTE — Progress Notes (Addendum)
Patient sent for thoracentesis via bed in stable condition.

## 2023-02-08 NOTE — Plan of Care (Signed)

## 2023-02-08 NOTE — Plan of Care (Signed)
  Problem: Activity: Goal: Ability to tolerate increased activity will improve Outcome: Progressing   Problem: Respiratory: Goal: Ability to maintain adequate ventilation will improve Outcome: Progressing Goal: Ability to maintain a clear airway will improve Outcome: Progressing   Problem: Activity: Goal: Risk for activity intolerance will decrease Outcome: Progressing   Problem: Nutrition: Goal: Adequate nutrition will be maintained Outcome: Progressing   Problem: Coping: Goal: Level of anxiety will decrease Outcome: Progressing   Problem: Elimination: Goal: Will not experience complications related to bowel motility Outcome: Progressing Goal: Will not experience complications related to urinary retention Outcome: Progressing

## 2023-02-09 ENCOUNTER — Inpatient Hospital Stay: Payer: Medicare Other

## 2023-02-09 DIAGNOSIS — I5033 Acute on chronic diastolic (congestive) heart failure: Secondary | ICD-10-CM | POA: Diagnosis not present

## 2023-02-09 DIAGNOSIS — I959 Hypotension, unspecified: Secondary | ICD-10-CM | POA: Diagnosis not present

## 2023-02-09 DIAGNOSIS — J90 Pleural effusion, not elsewhere classified: Secondary | ICD-10-CM | POA: Diagnosis not present

## 2023-02-09 DIAGNOSIS — J9621 Acute and chronic respiratory failure with hypoxia: Secondary | ICD-10-CM | POA: Diagnosis not present

## 2023-02-09 LAB — CBC
HCT: 34.5 % — ABNORMAL LOW (ref 39.0–52.0)
Hemoglobin: 10.4 g/dL — ABNORMAL LOW (ref 13.0–17.0)
MCH: 25.2 pg — ABNORMAL LOW (ref 26.0–34.0)
MCHC: 30.1 g/dL (ref 30.0–36.0)
MCV: 83.7 fL (ref 80.0–100.0)
Platelets: 167 10*3/uL (ref 150–400)
RBC: 4.12 MIL/uL — ABNORMAL LOW (ref 4.22–5.81)
RDW: 21.8 % — ABNORMAL HIGH (ref 11.5–15.5)
WBC: 8.5 10*3/uL (ref 4.0–10.5)
nRBC: 0 % (ref 0.0–0.2)

## 2023-02-09 LAB — BASIC METABOLIC PANEL
Anion gap: 10 (ref 5–15)
BUN: 19 mg/dL (ref 8–23)
CO2: 32 mmol/L (ref 22–32)
Calcium: 8 mg/dL — ABNORMAL LOW (ref 8.9–10.3)
Chloride: 94 mmol/L — ABNORMAL LOW (ref 98–111)
Creatinine, Ser: 0.84 mg/dL (ref 0.61–1.24)
GFR, Estimated: >60 mL/min (ref >60)
Glucose, Bld: 96 mg/dL (ref 70–99)
Potassium: 3.5 mmol/L (ref 3.5–5.1)
Sodium: 136 mmol/L (ref 135–145)

## 2023-02-09 LAB — PROCALCITONIN: Procalcitonin: 1.84 ng/mL

## 2023-02-09 LAB — GLUCOSE, CAPILLARY: Glucose-Capillary: 92 mg/dL (ref 70–99)

## 2023-02-09 MED ORDER — SODIUM CHLORIDE 0.9 % IV SOLN
3.0000 g | Freq: Four times a day (QID) | INTRAVENOUS | Status: DC
  Administered 2023-02-09 – 2023-02-11 (×9): 3 g via INTRAVENOUS
  Filled 2023-02-09 (×12): qty 8

## 2023-02-09 NOTE — Progress Notes (Signed)
Progress Note   Patient: Louis Sanchez ZDG:[id number] DOB: Oct 04, 1940 DOA: 02/06/2023     3 DOS: the patient was seen and examined on 02/09/2023   Brief hospital course: Louis Sanchez is a 82 y.o. 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  ROS was also positive for poor p.o. intake, decreased appetite and worsening confusion.  ED course and data reviewed.  Afebrile with mild tachypnea and desaturated up to 88% on 2 L of oxygen, saturation improved on 4 L.  Labs pertinent for procalcitonin of 1.90, BNP 907, CBC and BMP stable. CT head was without any acute abnormality. CXR with persistent right pleural effusion with right lower lobe infiltrate, unchanged from prior studies.  Increasing left pleural effusion with left lower lobe in filtrate versus atelectasis. Also showed cardiomegaly with vascular congestion.   EKG.  Personally reviewed.  Atrial fibrillation, heart rate of 80 and right axis deviation.   Patient received IV Lasix, ceftriaxone and Zithromax in ED.  7/16: Vital stable, currently on 3 L, UOP of 3200 recorded.  Strep pneumo negative. Multiple pressure injuries-no sign of infection, wound care was consulted. Thoracentesis was ordered after discussing with son. Significantly elevated TSH at 39, Synthroid dose was increased to 200 mcg-patient will need a repeat TSH in 3 to 4 weeks by PCP.  7/17: Hemodynamically stable this morning, yesterday evening he became short of breath, repeat chest x-ray with improved right pleural effusion, significant right lung volume loss and persistent bilateral basal infiltrate/atelectasis.  No worsening hypoxia.  Received morphine to help. Concern of  aspiration-swallow evaluation ordered Thoracentesis was done today-pending labs.  7/18: Currently stable.  Thoracentesis labs are consistent with transudate with preliminary negative cultures.  Swallow evaluation lead to barium swallow studies which shows oropharyngeal dysphagia with delayed swallow initiation and pharyngeal pooling leading to aspiration on thin liquids.  Swallow team is recommending dysphagia 3 diet with thick liquids. Procalcitonin remained elevated at 1.84, switching ceftriaxone with Unasyn for concern of aspiration pneumonia.  Patient is high risk for deterioration and mortality based on advanced age and underlying significant life limiting comorbidities.   Assessment and Plan: * Acute on chronic respiratory failure with hypoxia (HCC) Likely multifactorial, with concern of pneumonia, CHF and worsening pleural effusion. uses 2 L of oxygen at baseline, currently on 3 L of oxygen. -Continue with supplemental oxygen-wean to baseline as tolerated  Hypotension -Continue home midodrine  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 heart failure) (HCC) Worsening pleural effusion and pulmonary vascular congestion on imaging.  BNP elevated at 907. Prior echocardiogram was normal. -Continue IV Lasix 40 mg daily-renal functions are stable -Strict intake and output -Daily weight and BMP  Pneumonia of both lower lobes due to infectious organism Chest x-ray with increasing left-sided pleural effusion and infiltrate. Persistent right sided pleural effusion and infiltrate, unchanged. Procalcitonin elevated at 1.90>>1.84 Swallow studies with concern of oropharyngeal dysphagia and delayed initiation of swallowing causing increased risk of aspiration with thin liquid.  Patient need a lot of cues, likely secondary to advanced dementia. -Started him on ceftriaxone and Zithromax -Switching ceftriaxone with Unasyn for concern of aspiration pneumonia -Dysphagia 3 diet with thick liquids and  aspiration precautions -Patient will remain high risk for aspiration  Pleural effusion Patient  with chronic right-sided pleural effusion and little worsening of left-sided pleural effusion.  History of lung malignancy. -Thoracentesis with removal of 500 cc of transudative fluid, preliminary cultures negative.  Iron deficiency anemia -Continue home iron supplement  Chronic atrial fibrillation (HCC) Rate controlled.  Apparently Xarelto was discontinued. -Continue with metoprolol  Dyslipidemia -Continue statin for now, hepatic function pending  Hypothyroidism (acquired) Significantly elevated TSH at 39. -Increasing the dose of Synthroid to 200 mcg.  Patient need a repeat TSH in 3 to 4 weeks by PCP  Cirrhosis [facility]) Patient also has an history of liver malignancy. Not on any active treatment. -Hepatic function panel -Continue spironolactone and Lasix      Subjective: Patient was seen and examined.  Remained confused and oriented to self only.  Stating that I need to to drive to [facility] for an important matter today.  Physical Exam: Vitals:   02/09/23 0355 02/09/23 0500 02/09/23 0912 02/09/23 1240  BP: (!) 93/54  (!) 102/58 106/72  Pulse: 94  80 94  Resp:   13   Temp: 98.2 F (36.8 C)  97.8 F (36.6 C)   TempSrc: Oral  Oral   SpO2: 99%  100%   Weight:  53 kg    Height:       General.  Frail and malnourished elderly man, in no acute distress. Pulmonary.  Lungs clear bilaterally, normal respiratory effort. CV.  Regular rate and rhythm, no JVD, rub or murmur. Abdomen.  Soft, nontender, nondistended, BS positive. CNS.  Alert and oriented to self only.  No focal neurologic deficit. Extremities.  No edema, no cyanosis, pulses intact and symmetrical. Psychiatry.  Judgment and insight appears impaired.  Data Reviewed: Prior data reviewed  Family Communication: Discussed with son on phone  Disposition: Status is: Inpatient Remains inpatient appropriate because: Severity  of illness  Planned Discharge Destination: Skilled nursing facility  DVT prophylaxis.  Lovenox Time spent: 44 minutes  This record has been created using Conservation officer, historic buildings. Errors have been sought and corrected,but may not always be located. Such creation errors do not reflect on the standard of care.   Author: Arnetha Courser, MD 02/09/2023 2:49 PM  For on call review [email or web address].

## 2023-02-09 NOTE — Procedures (Signed)
Modified Barium Swallow Study  Patient Details  Name: Louis Sanchez MRN: [id number] Date of Birth: 01-19-1941  Today's Date: 02/09/2023  Modified Barium Swallow completed.  Full report located under Chart Review in the Imaging Section.  History of Present Illness Pt is a 82 y.o. 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     History was obtained from daughter on phone and according to her patient was not overall feeling well for the past 1 to 2 weeks with decreased appetite and poor p.o. intake, worsening weakness to the point that he was not getting out of bed recently.  Increased cough.  No fever or chills.  No urinary symptoms.  At baseline patient knows about himself and where he is.  Currently worsening confusion.     Per daughter they were concerned about fluid overload as he exhibited similar symptoms in the past and was diagnosed with CHF exacerbation.   CXR: "persistent right pleural effusion with right lower lobe infiltrate, unchanged from prior studies.  Increasing left pleural effusion with left lower lobe in filtrate versus atelectasis.  Also showed cardiomegaly with vascular congestion.:.   Clinical Impression Pt seen for MBSS. Pt presents with a moderate oropharyngeal dysphagia most significant inefficient lingual motion with lingual pumping/rocking for A-P transit, delayed swallow initiation with pharyngeal pooling across all textures, before the swallow penetration and aspiration of thin liquids, during the swallow transient penetration of nectar-thick liquids, and mild-moderate pharyngeal stasis which was most appreciated with solid and puree. Above  mentioned deficits secondary to: lingual weakness/incoordination, reduced base of tongue retraction, reduced hyolaryngeal elevation/excursion, mistimed/reduced laryngeal vestibule closure, reduced pharyngeal stripping wave, and reduced pharyngeal and laryngeal sensation. Cued cough reduced contrast material in upper airway. Nectar-thick liquid wash reduced oral and pharyngeal stasis. Of note, pt with cervical hardware appreciated. Screening of esophageal swallow was limited due to poor visibility of esophagus in lateral plane during attempted sweep; however, retention of barium and backflow observed below the PES. Concern for co-existing esophageal dysphagia. Further work up may be beneficial. Recommend a mech soft diet with nectar-thick liquids with safe swallowing strategies/aspiration precautions as outlined below including, but not limited to, alternation of bites/sips, avoidance of straws, and cough every 2-3 bites/sips. Recommend post-acute SLP services for above mentioned deficits. SLP to f/u while pt in house for diet tolerance.  Factors that may increase risk of adverse event in presence of aspiration [employer] 2021): Poor general health and/or compromised immunity;Reduced cognitive function;Frail or deconditioned;Limited mobility  Swallow Evaluation Recommendations Recommendations: PO diet PO Diet Recommendation: Dysphagia 3 (Mechanical soft);Mildly thick liquids (Level 2, nectar thick) Liquid Administration via: Spoon;Cup;No straw Medication Administration: Crushed with puree Supervision: Patient able to self-feed;Full supervision/cueing for swallowing strategies;Set-up assistance for safety Swallowing strategies  : Minimize environmental distractions;Slow rate;Small bites/sips;Follow solids with liquids;Hard cough after swallowing Postural changes: Position pt fully upright for meals;Stay upright 30-60 min after meals Recommended consults: Consider esophageal assessment Caregiver  Recommendations: Avoid jello, ice cream, thin soups, popsicles;Remove water pitcher     Clyde Canterbury, M.S., CCC-SLP Speech-Language Pathologist [facility] [phone] Arnette Felts)  Woodroe Chen 02/09/2023,12:01 PM

## 2023-02-09 NOTE — Care Management Important Message (Signed)
Important Message  Patient Details  Name: Louis Sanchez MRN: [id number] Date of Birth: 1941/01/24   Medicare Important Message Given:  N/A - LOS <3 / Initial given by admissions     Louis Sanchez 02/09/2023, 8:07 AM

## 2023-02-09 NOTE — Assessment & Plan Note (Signed)
Chest x-ray with increasing left-sided pleural effusion and infiltrate. Persistent right sided pleural effusion and infiltrate, unchanged. Procalcitonin elevated at 1.90>>1.84 Swallow studies with concern of oropharyngeal dysphagia and delayed initiation of swallowing causing increased risk of aspiration with thin liquid.  Patient need a lot of cues, likely secondary to advanced dementia. -Started him on ceftriaxone and Zithromax -Switching ceftriaxone with Unasyn for concern of aspiration pneumonia -Dysphagia 3 diet with thick liquids and aspiration precautions -Patient will remain high risk for aspiration

## 2023-02-09 NOTE — Plan of Care (Signed)
  Problem: Activity: Goal: Ability to tolerate increased activity will improve Outcome: Not Progressing   Problem: Clinical Measurements: Goal: Ability to maintain a body temperature in the normal range will improve Outcome: Not Progressing   Problem: Respiratory: Goal: Ability to maintain adequate ventilation will improve Outcome: Not Progressing

## 2023-02-09 NOTE — Progress Notes (Signed)
Pharmacy Antibiotic Note  Louis Sanchez is a 82 y.o. male admitted on 02/06/2023 with aspiration pneumonia.  Pharmacy has been consulted for Unasyn dosing.  Plan: Will order Unasyn 3 gm IV q6h for Crcl 52.6 ml/min   Height: 5\' 6"  (167.6 cm) Weight: 53 kg (116 lb 13.5 oz) IBW/kg (Calculated) : 63.8  Temp (24hrs), Avg:97.9 F (36.6 C), Min:97.4 F (36.3 C), Max:98.2 F (36.8 C)  Recent Labs  Lab 02/06/23 1228 02/07/23 0427 02/08/23 0624 02/09/23 0525  WBC 8.2 7.8  --  8.5  CREATININE 1.00 0.92 0.88 0.84    Estimated Creatinine Clearance: 52.6 mL/min (by C-G formula based on SCr of 0.84 mg/dL).    Allergies  Allergen Reactions   Atorvastatin     Other reaction(s): Muscle Pain Other reaction(s): Muscle Pain Other reaction(s): Muscle Pain Other reaction(s): Muscle Pain Other reaction(s): Muscle Pain    Levofloxacin Rash    Antimicrobials this admission: Ceftriaxone 7/15 >> 7/18 Azithromycin 7/15 >>   (7/19) Unasyn 7/18>>  Dose adjustments this admission:    Microbiology results:   7/17 Pleural Fluid cx:  NGTD BCx:     UCx:      Sputum:      MRSA PCR:    Thank you for allowing pharmacy to be a part of this patient's care.  Evalena Fujii A 02/09/2023 7:54 AM

## 2023-02-10 DIAGNOSIS — J9621 Acute and chronic respiratory failure with hypoxia: Secondary | ICD-10-CM | POA: Diagnosis not present

## 2023-02-10 DIAGNOSIS — J90 Pleural effusion, not elsewhere classified: Secondary | ICD-10-CM | POA: Diagnosis not present

## 2023-02-10 DIAGNOSIS — I959 Hypotension, unspecified: Secondary | ICD-10-CM | POA: Diagnosis not present

## 2023-02-10 DIAGNOSIS — J189 Pneumonia, unspecified organism: Secondary | ICD-10-CM | POA: Diagnosis not present

## 2023-02-10 LAB — BASIC METABOLIC PANEL
Anion gap: 10 (ref 5–15)
BUN: 21 mg/dL (ref 8–23)
CO2: 35 mmol/L — ABNORMAL HIGH (ref 22–32)
Calcium: 8.1 mg/dL — ABNORMAL LOW (ref 8.9–10.3)
Chloride: 93 mmol/L — ABNORMAL LOW (ref 98–111)
Creatinine, Ser: 0.82 mg/dL (ref 0.61–1.24)
GFR, Estimated: >60 mL/min (ref >60)
Glucose, Bld: 111 mg/dL — ABNORMAL HIGH (ref 70–99)
Potassium: 3.4 mmol/L — ABNORMAL LOW (ref 3.5–5.1)
Sodium: 138 mmol/L (ref 135–145)

## 2023-02-10 LAB — BODY FLUID CULTURE W GRAM STAIN

## 2023-02-10 LAB — GLUCOSE, CAPILLARY
Glucose-Capillary: 88 mg/dL (ref 70–99)
Glucose-Capillary: 89 mg/dL (ref 70–99)
Glucose-Capillary: 110 mg/dL — ABNORMAL HIGH (ref 70–99)

## 2023-02-10 MED ORDER — FUROSEMIDE 40 MG PO TABS: 40.0000 mg | ORAL_TABLET | Freq: Every day | ORAL | Status: AC

## 2023-02-10 MED ORDER — ZINC SULFATE 220 (50 ZN) MG PO CAPS: 220.0000 mg | ORAL_CAPSULE | Freq: Every day | ORAL | Status: AC

## 2023-02-10 MED ORDER — ASCORBIC ACID 500 MG PO TABS: 500.0000 mg | ORAL_TABLET | Freq: Two times a day (BID) | ORAL | Status: AC

## 2023-02-10 MED ORDER — AMOXICILLIN-POT CLAVULANATE 875-125 MG PO TABS: 1.0000 | ORAL_TABLET | Freq: Two times a day (BID) | ORAL | 0 refills | Status: AC

## 2023-02-10 MED ORDER — ENSURE ENLIVE PO LIQD: 237.0000 mL | Freq: Three times a day (TID) | ORAL | Status: AC

## 2023-02-10 MED ORDER — LEVOTHYROXINE SODIUM 200 MCG PO TABS: 200.0000 ug | ORAL_TABLET | Freq: Every day | ORAL | Status: AC

## 2023-02-10 NOTE — Progress Notes (Signed)
SLP Cancellation Note  Patient Details Name: [name] MRN: [id number] DOB: Nov 11, 1940   Cancelled treatment:       Reason Eval/Treat Not Completed: Fatigue/lethargy limiting ability to participate (Attempted diet tolerance. Pt unable to rouse for safe PO intake. Will continue efforts as appropriate.)  Clyde Canterbury, M.S., CCC-SLP Speech-Language Pathologist [facility] [phone] Arnette Felts)  Woodroe Chen 02/10/2023, 8:57 AM

## 2023-02-10 NOTE — TOC CM/SW Note (Signed)
Per [facility] handoff, patient is from [facility]. Called Alvino Chapel at [facility] to confirm and see if patient can return today - Left VM. Awaiting return call.  Alfonso Ramus, LCSW Transitions of Care Department [phone]

## 2023-02-10 NOTE — Care Management Important Message (Signed)
Important Message  Patient Details  Name: MARCELINO CAMPOS MRN: [id number] Date of Birth: [birth year]   Medicare Important Message Given:  Other (see comment)  12:05 pm Left a message for patient's son, Leonard Downing ([phone]) asking for a return call. Will await a return call.   Olegario Messier A Rolande Moe 02/10/2023, 12:14 PM

## 2023-02-10 NOTE — TOC Initial Note (Addendum)
Transition of Care [facility]) - Initial/Assessment Note    Patient Details  Name: Louis Sanchez MRN: [id number] Date of Birth: 10-11-1940  Transition of Care [facility]) CM/SW Contact:    Liliana Cline, LCSW Phone Number: 02/10/2023, 11:06 AM  Clinical Narrative:                 Sherron Monday to Alvino Chapel at [facility]. Patient is a private pay LTC resident there. She states they plan to bring him back under his Medicare since he has had a change in condition.  Their system is currently down, she will let CSW know as soon as it comes back. They cannot accept patients back until their system is back up. Alvino Chapel states if it does not come back up today, she will work tomorrow and patient can come back tomorrow. RN and MD updated.   3:48- Sunday Spillers at [facility], she states their systems are still down. She will follow up with [facility] tomorrow if the systems are back up.   3:53- PASRR is [phone].   Expected Discharge Plan: Skilled Nursing Facility Barriers to Discharge: Continued Medical Work up   Patient Goals and CMS Choice            Expected Discharge Plan and Services       Living arrangements for the past 2 months: Skilled Nursing Facility Expected Discharge Date: 02/10/23                                    Prior Living Arrangements/Services Living arrangements for the past 2 months: Skilled Nursing Facility Lives with:: Facility Resident                   Activities of Daily Living Home Assistive Devices/Equipment: None ADL Screening (condition at time of admission) Patient's cognitive ability adequate to safely complete daily activities?: Yes Is the patient deaf or have difficulty hearing?: No Does the patient have difficulty seeing, even when wearing glasses/contacts?: No Does the patient have difficulty concentrating, remembering, or making decisions?: No Patient able to express need for assistance with ADLs?: Yes Does the patient have difficulty dressing or  bathing?: Yes Independently performs ADLs?: No Communication: Independent Dressing (OT): Needs assistance Is this a change from baseline?: Pre-admission baseline Grooming: Needs assistance Is this a change from baseline?: Pre-admission baseline Feeding: Needs assistance Is this a change from baseline?: Pre-admission baseline Bathing: Needs assistance Is this a change from baseline?: Pre-admission baseline Toileting: Needs assistance Is this a change from baseline?: Pre-admission baseline In/Out Bed: Needs assistance Is this a change from baseline?: Pre-admission baseline Walks in Home: Needs assistance Is this a change from baseline?: Pre-admission baseline Does the patient have difficulty walking or climbing stairs?: Yes Weakness of Legs: None Weakness of Arms/Hands: None  Permission Sought/Granted                  Emotional Assessment              Admission diagnosis:  Pleural effusion [J90] Acute respiratory failure with hypoxia (HCC) [J96.01] Acute on chronic respiratory failure with hypoxia (HCC) [J96.21] Pneumonia of both lower lobes due to infectious organism [J18.9] Acute on chronic congestive heart failure, unspecified heart failure type [facility]) [I50.9] Patient Active Problem List   Diagnosis Date Noted   Protein-calorie malnutrition, severe 02/08/2023   Pressure injury of skin 09/15/2022   Hemoptysis 09/07/2022   Hypotension 09/07/2022  Cirrhosis (HCC) 09/07/2022   Acute on chronic respiratory failure with hypoxia (HCC) 09/07/2022   Severe sepsis (HCC) 08/29/2022   Acute respiratory failure with hypoxia (HCC) 08/29/2022   Aspiration pneumonia (HCC) 08/29/2022   AKI (acute kidney injury) (HCC) 08/29/2022   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 heart failure) (HCC) 08/10/2022   Permanent atrial fibrillation (HCC) 08/07/2022   Hypokalemia 08/07/2022   Acute on chronic congestive heart failure (HCC) 04/11/2022   Iron deficiency anemia 04/11/2022    Anemia of chronic disease 02/18/2022   Essential hypertension 02/18/2022   Hypothyroidism 02/18/2022   Dyslipidemia 02/18/2022   Pneumonia of both lower lobes due to infectious organism 02/18/2022   Wound of foot 01/12/2021   Right ventricular dysfunction 10/27/2020   Acquired thrombophilia (HCC) 10/07/2020   Pleural effusion 04/08/2020   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/18/2019   Fusion of spine of cervical region 11/18/2019   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/18/2019   Neck pain 11/18/2019   Personal history of skin cancer 05/21/2019   Chronic anticoagulation 12/11/2016   Former tobacco use 12/11/2016   Malignant neoplasm of lower lobe of right lung (HCC) 11/28/2016   Aortic atherosclerosis (HCC) 10/07/2016   Hypothyroidism (acquired) 10/07/2016   Obstructive sleep apnea syndrome 10/07/2016   Thrombocytopenia (HCC) 10/07/2016   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   Mitral regurgitation 09/20/2016   Acute bronchitis 09/13/2016   Leukocytosis 09/13/2016   Venous stasis dermatitis 09/13/2016   LFT elevation    Alcoholic gastritis    Atrial fibrillation with RVR (HCC) 09/11/2016   Anxiety, mild 05/23/2016   Hematochezia 05/23/2016   History of hypothyroidism 05/23/2016   Chronic atrial fibrillation (HCC) 05/03/2016   Ruptured varicose vein 01/25/2016   Varicose veins of both lower extremities with complications 01/25/2016   Mitral valve insufficiency 08/25/2014   Inguinal hernia 02/18/2013   Actinic keratosis 01/21/2010   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 [place] [address] Phone: [phone] Fax: [phone]     Social Determinants of Health (SDOH) Social History: SDOH Screenings   Food Insecurity: No Food Insecurity (02/06/2023)  Housing: Low Risk  (02/06/2023)   Transportation Needs: No Transportation Needs (02/06/2023)  Utilities: Not At Risk (02/06/2023)  Tobacco Use: Medium Risk (01/05/2023)   SDOH Interventions:     Readmission Risk Interventions     No data to display

## 2023-02-10 NOTE — Discharge Summary (Addendum)
Physician Discharge Summary   Patient: Louis Sanchez MRN: [id number] DOB: 03/22/1941  Admit date:     02/06/2023  Discharge date: 02/11/23  Discharge Physician: Kirstie Peri   PCP: Lynnea Ferrier, MD   Recommendations at discharge:   Please obtain CBC and BMP in a week. Follow up with PCP Follow up with Palliative Care Dysphagia 3 with thick liquid diet- Increased risk of Aspiration. Aspiration Precautions.  Discharge Diagnoses: Principal Problem:   Acute on chronic respiratory failure with hypoxia (HCC) Active Problems:   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 heart failure) (HCC)   Hypotension   Pneumonia of both lower lobes due to infectious organism   Pleural effusion   Iron deficiency anemia   Chronic atrial fibrillation (HCC)   Dyslipidemia   Hypothyroidism (acquired)   Cirrhosis (HCC)   Acute on chronic congestive heart failure (HCC)   Protein-calorie malnutrition, severe   Hospital Course: Louis Sanchez is a 82 y.o. 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  ROS was also positive for poor p.o. intake, decreased appetite and worsening confusion.  ED course and data reviewed.  Afebrile with mild tachypnea and desaturated up to 88% on 2 L of oxygen, saturation improved on 4 L.  Labs pertinent for procalcitonin of 1.90, BNP 907, CBC and BMP stable. CT head was without any acute abnormality. CXR with persistent right pleural effusion with right lower lobe infiltrate, unchanged from prior studies.  Increasing left pleural effusion with left lower lobe in filtrate versus atelectasis. Also showed cardiomegaly with vascular congestion.   EKG.  Personally reviewed.   Atrial fibrillation, heart rate of 80 and right axis deviation.   Patient received IV Lasix, ceftriaxone and Zithromax in ED.  7/16: Vital stable, currently on 3 L, UOP of 3200 recorded.  Strep pneumo negative. Multiple pressure injuries-no sign of infection, wound care was consulted. Thoracentesis was ordered after discussing with son. Significantly elevated TSH at 39, Synthroid dose was increased to 200 mcg-patient will need a repeat TSH in 3 to 4 weeks by PCP.  7/17: Hemodynamically stable this morning, yesterday evening he became short of breath, repeat chest x-ray with improved right pleural effusion, significant right lung volume loss and persistent bilateral basal infiltrate/atelectasis.  No worsening hypoxia.  Received morphine to help. Concern of aspiration-swallow evaluation ordered Thoracentesis was done today-pending labs.  7/18: Currently stable.  Thoracentesis labs are consistent with transudate with preliminary negative cultures.  Swallow evaluation lead to barium swallow studies which shows oropharyngeal dysphagia with delayed swallow initiation and pharyngeal pooling leading to aspiration on thin liquids.  Swallow team is recommending dysphagia 3 diet with thick liquids. Procalcitonin remained elevated at 1.84, switching ceftriaxone with Unasyn for concern of aspiration pneumonia.  7/19: Remain stable, completed the course of azithromax today, will be discharge on 4 more days of Augmentin and 40 mg of daily lasix. He will continue with rest of his medications and need to follow up with his providers.   Patient is high risk for deterioration and mortality based on advanced age and underlying significant life limiting comorbidities. Patient has significant risk of aspiration, dysphagia 3 diet with thick liquids, along with strict aspiration pre cautions are recommended.Family is aware and understand. If  he continue to get worse they will consider comfort care only. Out patient  palliative care is recommended and they can switch to Hospice as appropriate.  Assessment and Plan: * Acute on chronic respiratory failure with hypoxia (HCC) Likely multifactorial, with concern of pneumonia, CHF and worsening pleural effusion. uses 2 L of oxygen at baseline, currently on 3 L of oxygen. -Continue with supplemental oxygen-wean to baseline as tolerated  Hypotension -Continue home midodrine  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 heart failure) (HCC) Worsening pleural effusion and pulmonary vascular congestion on imaging.  BNP elevated at 907. Prior echocardiogram was normal. -Continue IV Lasix 40 mg daily-renal functions are stable -Strict intake and output -Daily weight and BMP  Pneumonia of both lower lobes due to infectious organism Chest x-ray with increasing left-sided pleural effusion and infiltrate. Persistent right sided pleural effusion and infiltrate, unchanged. Procalcitonin elevated at 1.90>>1.84 Swallow studies with concern of oropharyngeal dysphagia and delayed initiation of swallowing causing increased risk of aspiration with thin liquid.  Patient need a lot of cues, likely secondary to advanced dementia. -Started him on ceftriaxone and Zithromax -Switching ceftriaxone with Unasyn for concern of aspiration pneumonia -Dysphagia 3 diet with thick liquids and aspiration precautions -Patient will remain high risk for aspiration  Pleural effusion Patient with chronic right-sided pleural effusion and little worsening of left-sided pleural effusion.  History of lung malignancy. -Thoracentesis with removal of 500 cc of transudative fluid, preliminary cultures negative.  Iron deficiency anemia -Continue home iron supplement  Chronic atrial fibrillation (HCC) Rate controlled.  Apparently Xarelto was discontinued. -Continue with metoprolol  Dyslipidemia -Continue statin for now, hepatic function pending  Hypothyroidism (acquired) Significantly elevated  TSH at 39. -Increasing the dose of Synthroid to 200 mcg.  Patient need a repeat TSH in 3 to 4 weeks by PCP  Cirrhosis [facility]) Patient also has an history of liver malignancy. Not on any active treatment. -Hepatic function panel -Continue spironolactone and Lasix       Consultants: None Procedures performed: Thoracentasis  Disposition: Skilled nursing facility Diet recommendation:  Discharge Diet Orders (From admission, onward)     Start     Ordered   02/10/23 0000  Diet - low sodium heart healthy        02/10/23 1035           Dysphagia type 3 Thick  Liquid DISCHARGE MEDICATION: Allergies as of 02/11/2023       Reactions   Atorvastatin    Other reaction(s): Muscle Pain Other reaction(s): Muscle Pain Other reaction(s): Muscle Pain Other reaction(s): Muscle Pain Other reaction(s): Muscle Pain   Levofloxacin Rash        Medication List     STOP taking these medications    rivaroxaban 20 MG Tabs tablet Commonly known as: XARELTO       TAKE these medications    acetaminophen 325 MG tablet Commonly known as: TYLENOL Take 650 mg by mouth every 6 (six) hours as needed for moderate pain or fever.   amoxicillin-clavulanate 875-125 MG tablet Commonly known as: AUGMENTIN Take 1 tablet by mouth 2 (two) times daily for 4 days.   ascorbic acid 500 MG tablet Commonly known as: VITAMIN C Take 1 tablet (500 mg total) by mouth 2 (two) times daily.   bisacodyl 10 MG suppository Commonly known as: DULCOLAX Place 1 suppository (10 mg total) rectally daily as needed for moderate constipation.   feeding supplement Liqd Take 237 mLs by mouth 3 (three) times daily between meals.   ferrous  sulfate 325 (65 FE) MG tablet Take 325 mg by mouth at bedtime.   furosemide 40 MG tablet Commonly known as: LASIX Take 1 tablet (40 mg total) by mouth daily. What changed: when to take this   gabapentin 300 MG capsule Commonly known as: NEURONTIN Take 1 capsule (300 mg total)  by mouth 3 (three) times daily.   levothyroxine 200 MCG tablet Commonly known as: SYNTHROID Take 1 tablet (200 mcg total) by mouth daily before breakfast. What changed:  medication strength how much to take   metoprolol succinate 25 MG 24 hr tablet Commonly known as: Toprol XL Take 0.5 tablets (12.5 mg total) by mouth at bedtime.   midodrine 5 MG tablet Commonly known as: PROAMATINE Take 1 tablet (5 mg total) by mouth 3 (three) times daily with meals.   multivitamin tablet Take 1 tablet by mouth daily.   nitroGLYCERIN 0.4 MG SL tablet Commonly known as: NITROSTAT Place 0.4 mg under the tongue every 5 (five) minutes as needed for chest pain.   polyvinyl alcohol 1.4 % ophthalmic solution Commonly known as: LIQUIFILM TEARS Place 1 drop into both eyes as needed for dry eyes.   potassium chloride SA 20 MEQ tablet Commonly known as: KLOR-CON M Take 1 tablet (20 mEq total) by mouth daily.   promethazine 25 MG tablet Commonly known as: PHENERGAN Take 25 mg by mouth every 4 (four) hours as needed for nausea or vomiting.   rosuvastatin 10 MG tablet Commonly known as: CRESTOR Take 10 mg by mouth at bedtime.   Sertraline HCl 150 MG Caps Take 150 mg by mouth daily.   sodium chloride 0.65 % Soln nasal spray Commonly known as: OCEAN Place 1 spray into both nostrils as needed for congestion.   spironolactone 25 MG tablet Commonly known as: ALDACTONE Take 1 tablet (25 mg total) by mouth daily.   tiotropium 18 MCG inhalation capsule Commonly known as: SPIRIVA Place 18 mcg into inhaler and inhale daily.   zinc sulfate 220 (50 Zn) MG capsule Take 1 capsule (220 mg total) by mouth daily.               Discharge Care Instructions  (From admission, onward)           Start     Ordered   02/11/23 0000  Change dressing (specify)       Comments: Dressing change: q3 days to back wounds   or PRN soiling   02/11/23 1135   02/10/23 0000  Discharge wound care:        Comments: Foam dressing to back wounds; change Q 3 days or PRN soiling   02/10/23 1035            Follow-up Information     Curtis Sites III, MD. Schedule an appointment as soon as possible for a visit in 1 week(s).   Specialty: Internal Medicine Contact information: [address] [place] [address] [phone]                Discharge Exam: Ceasar Mons Weights   02/09/23 0500 02/09/23 2258 02/11/23 0500  Weight: 53 kg 52.5 kg 51.7 kg   General. Frail and severely malnourishes elderly man,In no acute distress. Pulmonary.  Lungs clear bilaterally, normal respiratory effort. Decreased BS at right base. CV.  Regular rate and rhythm, no JVD, rub or murmur. Abdomen.  Soft, nontender, nondistended, BS positive. CNS.  Alert and oriented to self only .  No focal neurologic deficit.  Extremities.  No edema, no cyanosis, pulses intact and symmetrical. Psychiatry.  Judgment and insight appears impaired.   Condition at discharge: stable  The results of significant diagnostics from this hospitalization (including imaging, microbiology, ancillary and laboratory) are listed below for reference.   Imaging Studies: DG Swallowing Func-Speech Pathology  Result Date: 02/09/2023 Table formatting from the original result was not included. Modified Barium Swallow Study Patient Details Name: Louis Sanchez MRN: [id number] Date of Birth: 04-03-1941 Today's Date: 02/09/2023 HPI/PMH: HPI: Pt is a 82 y.o. 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     History was obtained from daughter on phone and according to her patient was not overall  feeling well for the past 1 to 2 weeks with decreased appetite and poor p.o. intake, worsening weakness to the point that he was not getting out of bed recently.  Increased cough.  No fever or chills.  No urinary symptoms.  At baseline patient knows about himself and where he is.  Currently worsening confusion.     Per daughter they were concerned about fluid overload as he exhibited similar symptoms in the past and was diagnosed with CHF exacerbation.   CXR: "persistent right pleural effusion with right lower lobe infiltrate, unchanged from prior studies.  Increasing left pleural effusion with left lower lobe in filtrate versus atelectasis.  Also showed cardiomegaly with vascular congestion.:. Clinical Impression: Clinical Impression: Pt seen for MBSS. Pt presents with a moderate oropharyngeal dysphagia most significant inefficient lingual motion with lingual pumping/rocking for A-P transit, delayed swallow initiation with pharyngeal pooling across all textures, before the swallow penetration and aspiration of thin liquids, during the swallow transient penetration of nectar-thick liquids, and mild-moderate pharyngeal stasis which was most appreciated with solid and puree. Above mentioned deficits secondary to: lingual weakness/incoordination, reduced base of tongue retraction, reduced hyolaryngeal elevation/excursion, mistimed/reduced laryngeal vestibule closure, reduced pharyngeal stripping wave, and reduced pharyngeal and laryngeal sensation. Cued cough reduced contrast material in upper airway. Nectar-thick liquid wash reduced oral and pharyngeal stasis. Of note, pt with cervical hardware appreciated. Screening of esophageal swallow was limited due to poor visibility of esophagus in lateral plane during attempted sweep; however, retention of barium and backflow observed below the PES. Concern for co-existing esophageal dysphagia. Further work up may be beneficial. Recommend a mech soft diet with nectar-thick  liquids with safe swallowing strategies/aspiration precautions as outlined below including, but not limited to, alternation of bites/sips, avoidance of straws, and cough every 2-3 bites/sips. Recommend post-acute SLP services for above mentioned deficits. SLP to f/u while pt in house for diet tolerance. Factors that may increase risk of adverse event in presence of aspiration [employer] Clearance Coots 2021): Factors that may increase risk of adverse event in presence of aspiration [employer]ye [employer]aks [employer] Clearance Coots 2021): Poor general health and/or compromised immunity; Reduced cognitive function; Frail or deconditioned; Limited mobility Recommendations/Plan: Swallowing Evaluation Recommendations Swallowing Evaluation Recommendations Recommendations: PO diet PO Diet Recommendation: Dysphagia 3 (Mechanical soft); Mildly thick liquids (Level 2, nectar thick) Liquid Administration via: Spoon; Cup; No straw Medication Administration: Crushed with puree Supervision: Patient able to self-feed; Full supervision/cueing for swallowing strategies; Set-up assistance for safety Swallowing strategies  : Minimize environmental distractions; Slow rate; Small  bites/sips; Follow solids with liquids; Hard cough after swallowing Postural changes: Position pt fully upright for meals; Stay upright 30-60 min after meals Recommended consults: Consider esophageal assessment Caregiver Recommendations: Avoid jello, ice cream, thin soups, popsicles; Remove water pitcher Treatment Plan Treatment Plan Treatment recommendations: Therapy as outlined in treatment plan below Follow-up recommendations: Skilled nursing-short term rehab (<3 hours/day) Functional status assessment: Patient has had a recent decline in their functional status and demonstrates the ability to make significant improvements in function in a reasonable and predictable amount of time. Treatment frequency: Min 2x/week Treatment duration: 2 weeks Interventions: Aspiration precaution training;  Patient/family education; Trials of upgraded texture/liquids; Diet toleration management by SLP; Compensatory techniques Recommendations Recommendations for follow up therapy are one component of a multi-disciplinary discharge planning process, led by the attending physician.  Recommendations may be updated based on patient status, additional functional criteria and insurance authorization. Assessment: Orofacial Exam: Orofacial Exam Oral Cavity: Oral Hygiene: WFL Oral Cavity - Dentition: Adequate natural dentition Orofacial Anatomy: WFL Oral Motor/Sensory Function: WFL Anatomy: Anatomy: Presence of cervical hardware Boluses Administered: Boluses Administered Boluses Administered: Thin liquids (Level 0); Mildly thick liquids (Level 2, nectar thick); Puree; Solid  Oral Impairment Domain: Oral Impairment Domain Lip Closure: Interlabial escape, no progression to anterior lip Tongue control during bolus hold: Escape to lateral buccal cavity/floor of mouth Bolus preparation/mastication: Slow prolonged chewing/mashing with complete recollection Bolus transport/lingual motion: Repetitive/disorganized tongue motion Oral residue: Trace residue lining oral structures; Residue collection on oral structures Initiation of pharyngeal swallow : Pyriform sinuses  Pharyngeal Impairment Domain: Pharyngeal Impairment Domain Soft palate elevation: Trace column of contrast or air between SP and PW Laryngeal elevation: Partial superior movement of thyroid cartilage/partial approximation of arytenoids to epiglottic petiole Anterior hyoid excursion: Partial anterior movement Epiglottic movement: Complete inversion Laryngeal vestibule closure: Incomplete, narrow column air/contrast in laryngeal vestibule Pharyngeal stripping wave : Present - diminished Pharyngeal contraction (A/P view only): N/A Pharyngoesophageal segment opening: Minimal distention/minimal duration, marked obstruction of flow Tongue base retraction: Wide column of contrast  or air between tongue base and PPW Pharyngeal residue: Collection of residue within or on pharyngeal structures Location of pharyngeal residue: Diffuse (>3 areas)  Esophageal Impairment Domain: Esophageal Impairment Domain Esophageal clearance upright position: Esophageal retention; Esophageal retention with retrograde flow below pharyngoesophageal segment (PES) Pill: Pill Consistency administered: -- (DNT) Penetration/Aspiration Scale Score: Penetration/Aspiration Scale Score 1.  Material does not enter airway: Puree; Solid 2.  Material enters airway, remains ABOVE vocal cords then ejected out: Mildly thick liquids (Level 2, nectar thick) 4.  Material enters airway, CONTACTS cords then ejected out: Mildly thick liquids (Level 2, nectar thick); Thin liquids (Level 0) 5.  Material enters airway, CONTACTS cords and not ejected out: Thin liquids (Level 0) 8.  Material enters airway, passes BELOW cords without attempt by patient to eject out (silent aspiration) : Thin liquids (Level 0) Compensatory Strategies: Compensatory Strategies Compensatory strategies: Yes Liquid wash: Effective (reduced pharyngeal stasis) Effective Liquid Wash: Mildly thick liquid (Level 2, nectar thick) Other(comment): Effective (cough re-swallow; cued; reduced penetration)   General Information: Caregiver present: No  Diet Prior to this Study: Dysphagia 3 (mechanical soft); Thin liquids (Level 0)   Temperature : Normal   Respiratory Status: WFL   Supplemental O2: None (Room air)   History of Recent Intubation: No  Behavior/Cognition: Alert; Cooperative; Pleasant mood; Distractible; Requires cueing Self-Feeding Abilities: Able to self-feed; Needs set-up for self-feeding Baseline vocal quality/speech: Normal; Hypophonia/low volume Volitional Cough: Able to elicit Volitional Swallow: Able to  elicit Exam Limitations: Limited visibility (reduced ability to screen cervical esophagus) Goal Planning: Prognosis for improved oropharyngeal function: Fair  Barriers to Reach Goals: Cognitive deficits; Severity of deficits; Overall medical prognosis Barriers/Prognosis Comment: baseline Pulmonary decline, GERD, Cognitive decline Patient/Family Stated Goal: to eat/drink safely Consulted and agree with results and recommendations: Patient; Nurse; Physician Pain: Pain Assessment Pain Assessment: No/denies pain End of Session: Start Time:SLP Start Time (ACUTE ONLY): 0820 Stop Time: SLP Stop Time (ACUTE ONLY): 0902 Time Calculation:SLP Time Calculation (min) (ACUTE ONLY): 42 min Charges: SLP Evaluations $ SLP Speech Visit: 1 Visit SLP Evaluations $MBS Swallow: 1 Procedure SLP visit diagnosis: SLP Visit Diagnosis: Dysphagia, oropharyngeal phase (R13.12); Dysphagia, pharyngoesophageal phase (R13.14) Past Medical History: Past Medical History: Diagnosis Date  Anxiety   Arrhythmia   atrial fibrillation  Arthritis   Benign neoplasm of colon 2004  Benign neoplasm of colon 04/04/2008  2mm tubular adenoma of the ascending colon  Cancer (HCC)   liver and lungs  CHF (congestive heart failure) (HCC)   GERD (gastroesophageal reflux disease)   Heart disease   Hypertension   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 [facility])   Other and unspecified hyperlipidemia 1997  Other specified disorder of male genital organs(608.89) 08/2012  Sleep disturbance, unspecified   Thyroid disease   Unspecified hypothyroidism 2005 Past Surgical History: Past Surgical History: Procedure Laterality Date  APPENDECTOMY  1953  colon polyps  2009  HERNIA REPAIR  1957  Right  INGUINAL HERNIA REPAIR Right 2014  SPINE SURGERY  1997  neck  WRIST SURGERY   Clyde Canterbury, M.S., CCC-SLP Speech-Language Pathologist [facility] [phone] (ASCOM) Woodroe Chen 02/09/2023, 12:01 PM  US THORACENTESIS ASP PLEURAL SPACE W/IMG GUIDE  Result Date: 02/08/2023 INDICATION: 82 year old male with history of pneumonia and pleural effusion. EXAM: ULTRASOUND GUIDED LEFT  THORACENTESIS MEDICATIONS: None. COMPLICATIONS: None immediate. PROCEDURE: An ultrasound guided thoracentesis was thoroughly discussed with the patient and questions answered. The benefits, risks, alternatives and complications were also discussed. The patient understands and wishes to proceed with the procedure. Written consent was obtained. Ultrasound was performed to localize and mark an adequate pocket of fluid in the left chest. The area was then prepped and draped in the normal sterile fashion. 1% Lidocaine was used for local anesthesia. Under ultrasound guidance a 19 gauge, 7-cm, Yueh catheter was introduced. Thoracentesis was performed. The catheter was removed and a dressing applied. FINDINGS: A total of approximately 500 mL of translucent, straw-colored fluid was removed. Samples were sent to the laboratory as requested by the clinical team. IMPRESSION: Successful ultrasound guided left thoracentesis yielding 500 mL of pleural fluid. Marliss Coots, MD [facility] Radiology Electronically Signed   By: Marliss Coots M.D.   On: 02/08/2023 16:36   DG Chest Port 1 View  Result Date: 02/08/2023 CLINICAL DATA:  Pleural effusion, pneumonia EXAM: PORTABLE CHEST 1 VIEW COMPARISON:  Previous studies including the examination of 02/07/2023 FINDINGS: Transverse diameter of heart is increased. The central pulmonary vessels are prominent without signs of alveolar pulmonary edema. Linear densities are seen in medial left lower lung field. There is interval improvement in aeration in left lower lung field. Blunting of left lateral CP angle seen in the previous examination is less evident. Increased density is seen in right mid and right lower lung fields with possible worsening. There is no pneumothorax. IMPRESSION: Infiltrates are seen in right mid and right lower lung fields with interval worsening suggesting increasing atelectasis/pneumonia and  increasing right pleural  effusion. There is improvement in aeration in left lower lung fields suggesting decreasing infiltrate and pleural effusion. Cardiomegaly. Central pulmonary vessels are prominent without signs of pulmonary edema. Electronically Signed   By: Ernie Avena M.D.   On: 02/08/2023 15:25   DG Chest Port 1 View  Result Date: 02/07/2023 CLINICAL DATA:  Dyspnea EXAM: PORTABLE CHEST 1 VIEW COMPARISON:  02/06/2023 FINDINGS: Stable bibasilar consolidation and superimposed asymmetric right-sided volume loss. Small right pleural effusion may be present, but appears decreased since prior examination. Left basilar consolidation, while stable since immediate prior examination, appears new since remote prior examination of 11/24/2021. Given the presence of a bibasilar consolidation on multiple prior examinations including CT examination of 09/07/2022, recurrent aspiration should be considered. No pneumothorax. Stable cardiomegaly. Pulmonary vascularity is normal. No acute bone abnormality., IMPRESSION: 1. Stable bibasilar consolidation and superimposed asymmetric right-sided volume loss. Small right pleural effusion may be present, but appears decreased since prior examination. 2. Left basilar consolidation, while stable since immediate prior examination, appears new since remote prior examination of 11/24/2021. Given the presence of a bibasilar consolidation on multiple prior examinations including CT examination of 09/07/2022, recurrent aspiration should be considered. Electronically Signed   By: Helyn Numbers M.D.   On: 02/07/2023 22:00   CT Head Wo Contrast  Result Date: 02/06/2023 CLINICAL DATA:  Mental status change, unknown cause. EXAM: CT HEAD WITHOUT CONTRAST TECHNIQUE: Contiguous axial images were obtained from the base of the skull through the vertex without intravenous contrast. RADIATION DOSE REDUCTION: This exam was performed according to the departmental dose-optimization program which includes automated  exposure control, adjustment of the mA and/or kV according to patient size and/or use of iterative reconstruction technique. COMPARISON:  Head CT 01/05/2023 FINDINGS: Brain: There is no evidence of an acute infarct, intracranial hemorrhage, mass, midline shift, or extra-axial fluid collection. There is mild cerebral atrophy. Patchy hypodensities in the cerebral white matter are unchanged and nonspecific but compatible with mild-to-moderate chronic small vessel ischemic disease. Vascular: Calcified atherosclerosis at the skull base. No hyperdense vessel. Skull: No acute fracture or suspicious osseous lesion. Sinuses/Orbits: Visualized paranasal sinuses and mastoid air cells are clear. Right cataract extraction. Other: None. IMPRESSION: 1. No evidence of acute intracranial abnormality. 2. Mild-to-moderate chronic small vessel ischemic disease. Electronically Signed   By: Sebastian Ache M.D.   On: 02/06/2023 16:58   DG Chest 2 View  Result Date: 02/06/2023 CLINICAL DATA:  Cough, history of lung cancer EXAM: CHEST - 2 VIEW COMPARISON:  11/25/2022 FINDINGS: Cardiomegaly, vascular congestion. Continued right pleural effusion and right lower lobe consolidation, stable since prior study and dating back to 09/07/2022. Probable left pleural effusion as well with left lower lobe atelectasis or infiltrate, increasing since 11/25/2022. IMPRESSION: Cardiomegaly, vascular congestion. Continued right pleural effusion with right lower lobe infiltrate, unchanged over several prior studies dating back to 09/07/2022. Increasing layering left pleural effusion with left lower lobe atelectasis or infiltrate. Electronically Signed   By: Charlett Nose M.D.   On: 02/06/2023 13:44    Microbiology: Results for orders placed or performed during the hospital encounter of 02/06/23  Resp panel by RT-PCR (RSV, Flu A&B, Covid) Anterior Nasal Swab     Status: None   Collection Time: 02/06/23  4:03 PM   Specimen: Anterior Nasal Swab  Result  Value Ref Range Status   SARS Coronavirus 2 by RT PCR NEGATIVE NEGATIVE Final    Comment: (NOTE) SARS-CoV-2 target nucleic acids are NOT DETECTED.  The SARS-CoV-2 RNA  is generally detectable in upper respiratory specimens during the acute phase of infection. The lowest concentration of SARS-CoV-2 viral copies this assay can detect is 138 copies/mL. A negative result does not preclude SARS-Cov-2 infection and should not be used as the sole basis for treatment or other patient management decisions. A negative result may occur with  improper specimen collection/handling, submission of specimen other than nasopharyngeal swab, presence of viral mutation(s) within the areas targeted by this assay, and inadequate number of viral copies(<138 copies/mL). A negative result must be combined with clinical observations, patient history, and epidemiological information. The expected result is Negative.  Fact Sheet for Patients:  BloggerCourse.com  Fact Sheet for Healthcare Providers:  SeriousBroker.it  This test is no t yet approved or cleared by the [place] FDA and  has been authorized for detection and/or diagnosis of SARS-CoV-2 by FDA under an Emergency Use Authorization (EUA). This EUA will remain  in effect (meaning this test can be used) for the duration of the COVID-19 declaration under Section 564(b)(1) of the Act, 21 U.S.C.section 360bbb-3(b)(1), unless the authorization is terminated  or revoked sooner.       Influenza A by PCR NEGATIVE NEGATIVE Final   Influenza B by PCR NEGATIVE NEGATIVE Final    Comment: (NOTE) The Xpert Xpress SARS-CoV-2/FLU/RSV plus assay is intended as an aid in the diagnosis of influenza from Nasopharyngeal swab specimens and should not be used as a sole basis for treatment. Nasal washings and aspirates are unacceptable for Xpert Xpress SARS-CoV-2/FLU/RSV testing.  Fact Sheet for  Patients: BloggerCourse.com  Fact Sheet for Healthcare Providers: SeriousBroker.it  This test is not yet approved or cleared by the [place] FDA and has been authorized for detection and/or diagnosis of SARS-CoV-2 by FDA under an Emergency Use Authorization (EUA). This EUA will remain in effect (meaning this test can be used) for the duration of the COVID-19 declaration under Section 564(b)(1) of the Act, 21 U.S.C. section 360bbb-3(b)(1), unless the authorization is terminated or revoked.     Resp Syncytial Virus by PCR NEGATIVE NEGATIVE Final    Comment: (NOTE) Fact Sheet for Patients: BloggerCourse.com  Fact Sheet for Healthcare Providers: SeriousBroker.it  This test is not yet approved or cleared by the [place] FDA and has been authorized for detection and/or diagnosis of SARS-CoV-2 by FDA under an Emergency Use Authorization (EUA). This EUA will remain in effect (meaning this test can be used) for the duration of the COVID-19 declaration under Section 564(b)(1) of the Act, 21 U.S.C. section 360bbb-3(b)(1), unless the authorization is terminated or revoked.  Performed at [facility], [address] [place], [place] [address]   Body fluid culture w Gram Stain     Status: None (Preliminary result)   Collection Time: 02/08/23  2:44 PM   Specimen: PATH Cytology Pleural fluid  Result Value Ref Range Status   Specimen Description   Final    PLEURAL Performed at [facility], [address] [place], [place] [address]    Special Requests   Final    PLEURAL Performed at [facility], [address] [place], [place] [address]    Gram Stain   Final    RARE WBC PRESENT, PREDOMINANTLY MONONUCLEAR NO ORGANISMS SEEN    Culture   Final    NO GROWTH 3 DAYS Performed at [facility], [address] [place], [place] [address]     Report Status PENDING  Incomplete    Labs: CBC: Recent Labs  Lab 02/06/23 1228 02/07/23 0427 02/09/23 0525  WBC 8.2 7.8 8.5  HGB 10.5* 10.8* 10.4*  HCT 38.6* 36.6* 34.5*  MCV 91.9 85.9 83.7  PLT 188 172 167   Basic Metabolic Panel: Recent Labs  Lab 02/07/23 0427 02/08/23 0624 02/09/23 0525 02/10/23 0730 02/11/23 0555  NA 137 136 136 138 140  K 3.5 3.7 3.5 3.4* 3.8  CL 95* 95* 94* 93* 96*  CO2 32 33* 32 35* 34*  GLUCOSE 84 86 96 111* 98  BUN 20 21 19 21 22   CREATININE 0.92 0.88 0.84 0.82 0.88  CALCIUM 8.2* 8.0* 8.0* 8.1* 8.2*   Liver Function Tests: Recent Labs  Lab 02/06/23 1720 02/07/23 0427  AST 44* 39  ALT 28 27  ALKPHOS 181* 177*  BILITOT 1.2 1.1  PROT 7.2 7.3  ALBUMIN 2.7* 2.8*   CBG: Recent Labs  Lab 02/09/23 0912 02/10/23 0828 02/10/23 1604 02/10/23 2048 02/11/23 0752  GLUCAP 92 88 89 110* 87    Discharge time spent: greater than 30 minutes.  This record has been created using Conservation officer, historic buildings. Errors have been sought and corrected,but may not always be located. Such creation errors do not reflect on the standard of care.

## 2023-02-10 NOTE — [place] FL2 (Signed)
[place] MEDICAID FL2 LEVEL OF CARE FORM     IDENTIFICATION  Patient Name: Louis Sanchez Birthdate: 1941/06/13 Sex: male Admission Date (Current Location): 02/06/2023  [facility] and [place] Number:  Chiropodist and Address:  [facility], [address] [place], [place] [address]      Provider Number: [id number]  Attending Physician Name and Address:  Arnetha Courser, MD  Relative Name and Phone Number:       Current Level of Care: Hospital Recommended Level of Care: Skilled Nursing Facility Prior Approval Number:    Date Approved/Denied:   PASRR Number:    Discharge Plan:      Current Diagnoses: Patient Active Problem List   Diagnosis Date Noted   Protein-calorie malnutrition, severe 02/08/2023   Pressure injury of skin 09/15/2022   Hemoptysis 09/07/2022   Hypotension 09/07/2022   Cirrhosis (HCC) 09/07/2022   Acute on chronic respiratory failure with hypoxia (HCC) 09/07/2022   Severe sepsis (HCC) 08/29/2022   Acute respiratory failure with hypoxia (HCC) 08/29/2022   Aspiration pneumonia (HCC) 08/29/2022   AKI (acute kidney injury) (HCC) 08/29/2022   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 heart failure) (HCC) 08/10/2022   Permanent atrial fibrillation (HCC) 08/07/2022   Hypokalemia 08/07/2022   Acute on chronic congestive heart failure (HCC) 04/11/2022   Iron deficiency anemia 04/11/2022   Anemia of chronic disease 02/18/2022   Essential hypertension 02/18/2022   Hypothyroidism 02/18/2022   Dyslipidemia 02/18/2022   Pneumonia of both lower lobes due to infectious organism 02/18/2022   Wound of foot 01/12/2021   Right ventricular dysfunction 10/27/2020   Acquired thrombophilia (HCC) 10/07/2020   Pleural effusion 04/08/2020   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/18/2019   Fusion of spine of cervical region 11/18/2019   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/18/2019   Neck pain 11/18/2019    Personal history of skin cancer 05/21/2019   Chronic anticoagulation 12/11/2016   Former tobacco use 12/11/2016   Malignant neoplasm of lower lobe of right lung (HCC) 11/28/2016   Aortic atherosclerosis (HCC) 10/07/2016   Hypothyroidism (acquired) 10/07/2016   Obstructive sleep apnea syndrome 10/07/2016   Thrombocytopenia (HCC) 10/07/2016   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   Mitral regurgitation 09/20/2016   Acute bronchitis 09/13/2016   Leukocytosis 09/13/2016   Venous stasis dermatitis 09/13/2016   LFT elevation    Alcoholic gastritis    Atrial fibrillation with RVR (HCC) 09/11/2016   Anxiety, mild 05/23/2016   Hematochezia 05/23/2016   History of hypothyroidism 05/23/2016   Chronic atrial fibrillation (HCC) 05/03/2016   Ruptured varicose vein 01/25/2016   Varicose veins of both lower extremities with complications 01/25/2016   Mitral valve insufficiency 08/25/2014   Inguinal hernia 02/18/2013   Actinic keratosis 01/21/2010   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 Yes with Assist; Fluid consistency: Nectar Thick (Order [id number]). No straws. Extra gravies on meats, potatoes. Baked potato/sweet potatoes OK to have, if ordered, per Speech.)  AMBULATORY STATUS COMMUNICATION OF NEEDS Skin   Limited Assist Verbally Other (Comment) (deep tissue pressure injury mid/upper back; stage 2 mid back; pressure ulcers back --- foam  dressings)                       Personal Care Assistance Level of Assistance  Bathing, Feeding, Dressing Bathing Assistance: Limited assistance Feeding assistance: Limited assistance Dressing Assistance: Limited assistance      Functional Limitations Info             SPECIAL CARE FACTORS FREQUENCY  Speech therapy             Speech Therapy Frequency: 2 times per week      Contractures      Additional Factors Info  Code Status, Allergies Code Status Info: DNR Allergies Info: Atorvastatin, Levofloxacin           Current Medications (02/10/2023):  This is the current hospital active medication list Current Facility-Administered Medications  Medication Dose Route Frequency Provider Last Rate Last Admin   acetaminophen (TYLENOL) tablet 650 mg  650 mg Oral Q6H PRN Arnetha Courser, MD       Or   acetaminophen (TYLENOL) suppository 650 mg  650 mg Rectal Q6H PRN Arnetha Courser, MD       Ampicillin-Sulbactam (UNASYN) 3 g in sodium chloride 0.9 % 100 mL IVPB  3 g Intravenous Q6H Bari Mantis A, RPH 200 mL/hr at 02/10/23 0906 3 g at 02/10/23 2536   ascorbic acid (VITAMIN C) tablet 500 mg  500 mg Oral BID Arnetha Courser, MD   500 mg at 02/10/23 0854   azithromycin (ZITHROMAX) 500 mg in sodium chloride 0.9 % 250 mL IVPB  500 mg Intravenous Q24H Arnetha Courser, MD 250 mL/hr at 02/09/23 1752 500 mg at 02/09/23 1752   enoxaparin (LOVENOX) injection 40 mg  40 mg Subcutaneous Q24H Jaynie Bream, RPH   40 mg at 02/09/23 2221   feeding supplement (ENSURE ENLIVE / ENSURE PLUS) liquid 237 mL  237 mL Oral TID BM Arnetha Courser, MD   237 mL at 02/09/23 2240   ferrous sulfate tablet 325 mg  325 mg Oral QHS Arnetha Courser, MD   325 mg at 02/09/23 2229   furosemide (LASIX) injection 40 mg  40 mg Intravenous Daily Arnetha Courser, MD   40 mg at 02/10/23 0854   furosemide (LASIX) injection 40 mg  40 mg Intravenous Once Arnetha Courser, MD       gabapentin (NEURONTIN) capsule 300 mg  300 mg Oral TID Arnetha Courser, MD   300 mg at 02/10/23 0854   levothyroxine (SYNTHROID) tablet 200 mcg  200 mcg Oral QAC breakfast Arnetha Courser, MD   200 mcg at 02/10/23 0625   lidocaine (PF) (XYLOCAINE) 1 % injection 10 mL  10 mL Intradermal  Once Suttle, Thressa Sheller, MD       metoprolol succinate (TOPROL-XL) 24 hr tablet 12.5 mg  12.5 mg Oral QHS Arnetha Courser, MD   12.5 mg at 02/09/23 2227   midodrine (PROAMATINE) tablet 5 mg  5 mg Oral TID WC Arnetha Courser, MD   5 mg at 02/10/23 0854   multivitamin with minerals tablet 1 tablet  1 tablet Oral Daily Arnetha Courser, MD   1 tablet at 02/10/23 0853   ondansetron (ZOFRAN) tablet 4 mg  4 mg Oral Q6H PRN Arnetha Courser, MD       Or   ondansetron (ZOFRAN) injection 4 mg  4 mg Intravenous Q6H PRN Arnetha Courser, MD       phenol (CHLORASEPTIC) mouth spray 1 spray  1 spray Mouth/Throat PRN Arnetha Courser, MD   1  spray at 02/08/23 0015   polyethylene glycol (MIRALAX / GLYCOLAX) packet 17 g  17 g Oral Daily PRN Arnetha Courser, MD       polyvinyl alcohol (LIQUIFILM TEARS) 1.4 % ophthalmic solution 1 drop  1 drop Both Eyes PRN Arnetha Courser, MD   1 drop at 02/07/23 2245   potassium chloride SA (KLOR-CON M) CR tablet 20 mEq  20 mEq Oral Daily Arnetha Courser, MD   20 mEq at 02/10/23 0853   rosuvastatin (CRESTOR) tablet 10 mg  10 mg Oral QHS Arnetha Courser, MD   10 mg at 02/09/23 2229   sertraline (ZOLOFT) tablet 150 mg  150 mg Oral Daily Arnetha Courser, MD   150 mg at 02/10/23 0853   sodium chloride (OCEAN) 0.65 % nasal spray 1 spray  1 spray Each Nare PRN Arnetha Courser, MD       sodium chloride flush (NS) 0.9 % injection 3 mL  3 mL Intravenous Q12H Arnetha Courser, MD   3 mL at 02/10/23 0853   spironolactone (ALDACTONE) tablet 25 mg  25 mg Oral Daily Arnetha Courser, MD   25 mg at 02/10/23 0852   tiotropium (SPIRIVA) inhalation capsule ([facility] use ONLY) 18 mcg  18 mcg Inhalation Daily Arnetha Courser, MD   18 mcg at 02/10/23 0855   zinc sulfate capsule 220 mg  220 mg Oral Daily Arnetha Courser, MD   220 mg at 02/10/23 8119     Discharge Medications: Please see discharge summary for a list of discharge medications.  Relevant Imaging Results:  Relevant Lab Results:   Additional Information SS #: [id number]  [id number]  Gissell Barra E Ralyn Stlaurent, LCSW

## 2023-02-11 DIAGNOSIS — J9621 Acute and chronic respiratory failure with hypoxia: Secondary | ICD-10-CM | POA: Diagnosis not present

## 2023-02-11 LAB — BASIC METABOLIC PANEL
Anion gap: 10 (ref 5–15)
BUN: 22 mg/dL (ref 8–23)
CO2: 34 mmol/L — ABNORMAL HIGH (ref 22–32)
Calcium: 8.2 mg/dL — ABNORMAL LOW (ref 8.9–10.3)
Chloride: 96 mmol/L — ABNORMAL LOW (ref 98–111)
Creatinine, Ser: 0.88 mg/dL (ref 0.61–1.24)
GFR, Estimated: >60 mL/min (ref >60)
Glucose, Bld: 98 mg/dL (ref 70–99)
Potassium: 3.8 mmol/L (ref 3.5–5.1)
Sodium: 140 mmol/L (ref 135–145)

## 2023-02-11 LAB — BODY FLUID CULTURE W GRAM STAIN

## 2023-02-11 LAB — GLUCOSE, CAPILLARY
Glucose-Capillary: 87 mg/dL (ref 70–99)
Glucose-Capillary: 151 mg/dL — ABNORMAL HIGH (ref 70–99)

## 2023-02-11 MED ORDER — BISACODYL 10 MG RE SUPP
10.0000 mg | Freq: Every day | RECTAL | Status: DC | PRN
  Administered 2023-02-11: 10 mg via RECTAL
  Filled 2023-02-11: qty 1

## 2023-02-11 MED ORDER — BISACODYL 10 MG RE SUPP: 10.0000 mg | Freq: Every day | RECTAL | 0 refills | Status: AC | PRN

## 2023-02-11 NOTE — Plan of Care (Signed)
Patient report given to RN Claris Che. All questions are addressed via phone. Problem: Activity: Goal: Ability to tolerate increased activity will improve Outcome: Completed/Met   Problem: Clinical Measurements: Goal: Ability to maintain a body temperature in the normal range will improve Outcome: Completed/Met   Problem: Respiratory: Goal: Ability to maintain adequate ventilation will improve Outcome: Completed/Met Goal: Ability to maintain a clear airway will improve Outcome: Completed/Met   Problem: Education: Goal: Knowledge of General Education information will improve Description: Including pain rating scale, medication(s)/side effects and non-pharmacologic comfort measures Outcome: Completed/Met   Problem: Health Behavior/Discharge Planning: Goal: Ability to manage health-related needs will improve Outcome: Completed/Met   Problem: Clinical Measurements: Goal: Ability to maintain clinical measurements within normal limits will improve Outcome: Completed/Met Goal: Will remain free from infection Outcome: Completed/Met Goal: Diagnostic test results will improve Outcome: Completed/Met Goal: Respiratory complications will improve Outcome: Completed/Met Goal: Cardiovascular complication will be avoided Outcome: Completed/Met   Problem: Activity: Goal: Risk for activity intolerance will decrease Outcome: Completed/Met   Problem: Nutrition: Goal: Adequate nutrition will be maintained Outcome: Completed/Met   Problem: Coping: Goal: Level of anxiety will decrease Outcome: Completed/Met   Problem: Elimination: Goal: Will not experience complications related to bowel motility Outcome: Completed/Met Goal: Will not experience complications related to urinary retention Outcome: Completed/Met   Problem: Pain Managment: Goal: General experience of comfort will improve Outcome: Completed/Met   Problem: Safety: Goal: Ability to remain free from injury will  improve Outcome: Completed/Met   Problem: Skin Integrity: Goal: Risk for impaired skin integrity will decrease Outcome: Completed/Met

## 2023-02-11 NOTE — TOC Progression Note (Signed)
Transition of Care [facility]) - Progression Note    Patient Details  Name: Louis Sanchez MRN: [id number] Date of Birth: 12-27-1940  Transition of Care [facility] CM/SW Contact  Hetty Ely, RN Phone Number: 02/11/2023, 11:38 AM  Clinical Narrative: CM spoke with Alvino Chapel from [facility] and got approval for patient to return today. CM notified Son Molly Maduro of discharge today back to [facility]. Son was receptive. EMS arranged, Nurse given report number, [phone].     Expected Discharge Plan: Skilled Nursing Facility Barriers to Discharge: Barriers Resolved  Expected Discharge Plan and Services       Living arrangements for the past 2 months: Skilled Nursing Facility Expected Discharge Date: 02/11/23               DME Arranged: N/A DME Agency: NA       HH Arranged: NA HH Agency: NA         Social Determinants of Health (SDOH) Interventions SDOH Screenings   Food Insecurity: No Food Insecurity (02/06/2023)  Housing: Low Risk  (02/06/2023)  Transportation Needs: No Transportation Needs (02/06/2023)  Utilities: Not At Risk (02/06/2023)  Tobacco Use: Medium Risk (01/05/2023)    Readmission Risk Interventions     No data to display

## 2023-02-12 LAB — BODY FLUID CULTURE W GRAM STAIN: Culture: NO GROWTH

## 2023-02-14 NOTE — Progress Notes (Deleted)
[id number], Susanne Borders III, MD (last seen 02/24); currently seeing PCP at [facility] Primary Cardiologist: Laurie Panda, NP (last seen 04/23)  HPI:   Mr Evelyn is a 82 y/o male with a history of atrial fibrillation, hyperlipidemia, TIA, anxiety, GERD, HTN, thyroid disease, sleep apnea, metastatic neuroendocrine tumor, previous tobacco use and chronic heart failure.   Was in the ED 11/25/22 due to falling out of bed. Was in the ED 01/05/23 due to mechanical fall. Evaluated and released. Admitted 02/06/23 due to worsening cough and increased oxygen requirement for the past few days. Also positive for poor p.o. intake, decreased appetite and worsening confusion. CT head was without any acute abnormality. CXR with persistent right pleural effusion with right lower lobe infiltrate, unchanged from prior studies.  Increasing left pleural effusion with left lower lobe in filtrate versus atelectasis with cardiomegaly with vascular congestion. IV lasix and antibiotics were given. Multiple pressure injuries-no sign of infection, wound care was consulted. Significantly elevated TSH at 39. Thoracentesis was done with removal of . Swallow evaluation lead to barium swallow studies which shows oropharyngeal dysphagia with delayed swallow initiation and pharyngeal pooling leading to aspiration on thin liquids.   Echo 04/11/22: EF of 60-65% along with mild LAE and moderate MR/TR.   He presents today for a HF follow-up visit with a chief complaint of   Currently at [facility]. Wearing oxygen at 3L around the clock although does occasionally forget to put it on. Has a walker but doesn't always use it.       ROS: All systems negative except as listed in HPI, PMH and Problem List.  SH:  Social History   Socioeconomic History   Marital status: Married    Spouse name: Not on file   Number of children: Not on file   Years of education: Not on file   Highest education level: Not on file  Occupational History   Not on  file  Tobacco Use   Smoking status: Former    Types: Cigarettes   Smokeless tobacco: Never  Vaping Use   Vaping status: Never Used  Substance and Sexual Activity   Alcohol use: Not Currently    Alcohol/week: 15.0 standard drinks of alcohol    Types: 15 Standard drinks or equivalent per week   Drug use: No   Sexual activity: Not Currently  Other Topics Concern   Not on file  Social History Narrative   Not on file   Social Determinants of Health   Financial Resource Strain: Not on file  Food Insecurity: No Food Insecurity (02/06/2023)   Hunger Vital Sign    Worried About Running Out of Food in the Last Year: Never true    Ran Out of Food in the Last Year: Never true  Transportation Needs: No Transportation Needs (02/06/2023)   PRAPARE - Administrator, Civil Service (Medical): No    Lack of Transportation (Non-Medical): No  Physical Activity: Not on file  Stress: Not on file  Social Connections: Not on file  Intimate Partner Violence: Not At Risk (02/06/2023)   Humiliation, Afraid, Rape, and Kick questionnaire    Fear of Current or Ex-Partner: No    Emotionally Abused: No    Physically Abused: No    Sexually Abused: No    FH:  Family History  Problem Relation Age of Onset   Alzheimer's disease Mother     Past Medical History:  Diagnosis Date   Anxiety    Arrhythmia    atrial  fibrillation   Arthritis    Benign neoplasm of colon 2004   Benign neoplasm of colon 04/04/2008   2mm tubular adenoma of the ascending colon   Cancer (HCC)    liver and lungs   CHF (congestive heart failure) (HCC)    GERD (gastroesophageal reflux disease)    Heart disease    Hypertension    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     amoxicillin-clavulanate (AUGMENTIN) 875-125 MG tablet Take 1 tablet by mouth 2 (two) times daily for 4 days. 8 tablet 0   ascorbic acid (VITAMIN C) 500 MG tablet Take 1 tablet (500 mg total) by mouth 2 (two) times daily.     bisacodyl (DULCOLAX) 10 MG suppository Place 1 suppository (10 mg total) rectally daily as needed for moderate constipation. 12 suppository 0   feeding supplement (ENSURE ENLIVE / ENSURE PLUS) LIQD Take 237 mLs by mouth 3 (three) times daily between meals.     ferrous sulfate 325 (65 FE) MG tablet Take 325 mg by mouth at bedtime.     furosemide (LASIX) 40 MG tablet Take 1 tablet (40 mg total) by mouth daily.     gabapentin (NEURONTIN) 300 MG capsule Take 1 capsule (300 mg total) by mouth 3 (three) times daily. 90 capsule 0   levothyroxine (SYNTHROID) 200 MCG tablet Take 1 tablet (200 mcg total) by mouth daily before breakfast.     metoprolol succinate (TOPROL XL) 25 MG 24 hr tablet Take 0.5 tablets (12.5 mg total) by mouth at bedtime. 15 tablet 0   midodrine (PROAMATINE) 5 MG tablet Take 1 tablet (5 mg total) by mouth 3 (three) times daily with meals.     Multiple Vitamin (MULTIVITAMIN) tablet Take 1 tablet by mouth daily.     nitroGLYCERIN (NITROSTAT) 0.4 MG SL tablet Place 0.4 mg under the tongue every 5 (five) minutes as needed for chest pain.     polyvinyl alcohol (LIQUIFILM TEARS) 1.4 % ophthalmic solution Place 1 drop into both eyes as needed for dry eyes. 15 mL 0   potassium chloride SA (KLOR-CON M) 20 MEQ tablet Take 1 tablet (20 mEq total) by mouth daily. 30 tablet 0   promethazine (PHENERGAN) 25 MG tablet Take 25 mg by mouth every 4 (four) hours as needed for nausea or vomiting.     rosuvastatin (CRESTOR) 10 MG tablet Take 10 mg by mouth at bedtime.     Sertraline HCl 150 MG CAPS Take 150 mg by mouth  daily.     sodium chloride (OCEAN) 0.65 % SOLN nasal spray Place 1 spray into both nostrils as needed for congestion.  0   spironolactone (ALDACTONE) 25 MG tablet Take 1 tablet (25 mg total) by mouth daily. 30 tablet 0   tiotropium (SPIRIVA) 18 MCG inhalation capsule Place 18 mcg into inhaler and inhale daily.     zinc sulfate 220 (50 Zn) MG capsule Take 1 capsule (220 mg total) by mouth daily.     No current facility-administered medications for this visit.      PHYSICAL EXAM:  General:  Well appearing. No  resp difficulty HEENT: normal Neck: supple. JVP flat. Carotids 2+ bilaterally; no bruits. No lymphadenopathy or thryomegaly appreciated. Cor: PMI normal. Regular rate & rhythm. No rubs, gallops or murmurs. Lungs: clear Abdomen: soft, nontender, nondistended. No hepatosplenomegaly. No bruits or masses. Good bowel sounds. Extremities: no cyanosis, clubbing, rash, edema Neuro: alert & orientedx3, cranial nerves grossly intact. Moves all 4 extremities w/o difficulty. Affect pleasant.   ECG: 02/10/23 showed AF   ASSESSMENT & PLAN:  1: NICM with preserved ejection fraction- - suspect due to HTN/ AF - NYHA class III - euvolemic today - weighing daily; reminded to call for an overnight weight gain of > 2 pounds or a weekly weight gain of > 5 pounds - weight 120 pounds from last visit 3 months ago - Echo 04/11/22: EF of 60-65% along with mild LAE and moderate MR/TR.  - not adding salt  - continue furosemide 40mg  BID - continue metoprolol succinate 12.5mg  daily - continue potassium daily - continue spironolactone 25mg  daily - wearing oxygen at 3L around the clock - BNP 02/06/23 was 907.4  2: HTN- - BP  - saw PCP ([facility]) 02/24; now seeing PCP at facility - [facility] 02/11/23 reviewed and showed sodium 140, potassium 3.8, creatinine 0.88 and GFR >60  3: Atrial fibrillation- - saw cardiology Renaldo Reel) 04/23 - HR (I) today  4: Sleep apnea- - says that he's wearing his CPAP at  bedtime  - encouraged to also wear it if he takes a nap during the day - also wearing oxygen at 3L around the clock  5: 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

## 2023-02-15 ENCOUNTER — Telehealth: Payer: Self-pay | Admitting: Family

## 2023-02-15 ENCOUNTER — Encounter: Payer: Medicare Other | Admitting: Family

## 2023-02-15 NOTE — Telephone Encounter (Signed)
Patient did not show for his [facility] appointment on 02/15/23

## 2024-01-08 IMAGING — DX DG CHEST 1V PORT
1 series · 1 of 1 positions shown · non-contrast
Comparison: PET CT 12/22/2017. Report from chest x-ray 07/27/2021.
Images not available.

CLINICAL DATA: Syncope.  Lung cancer.

EXAM:
PORTABLE CHEST 1 VIEW

[chest ap]
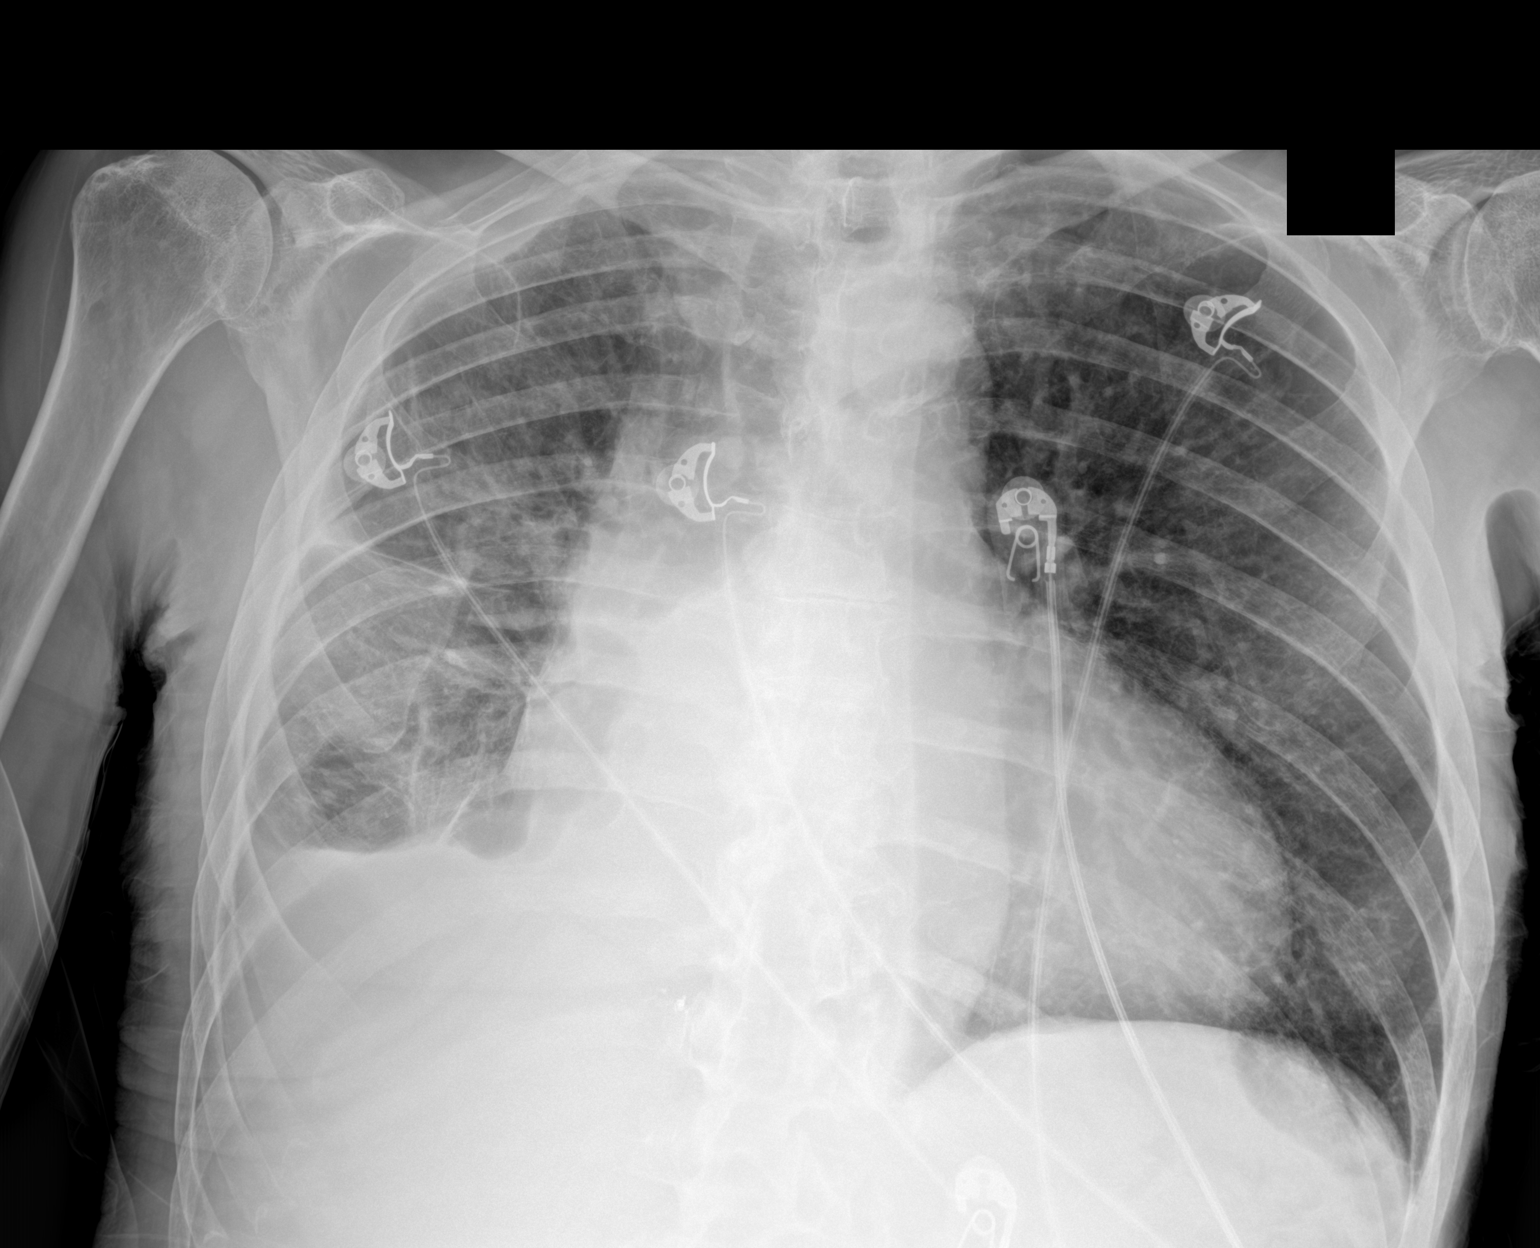

[1 of 1 positions shown; findings below may reference images not displayed]

FINDINGS: Small right pleural effusion noted. Scarring or atelectasis noted at
the right lung base. Left lung clear. Mild cardiomegaly. No acute
bony abnormality.
IMPRESSION: Small right pleural effusion with right base atelectasis or
scarring.

Cardiomegaly.

## 2024-01-08 IMAGING — CT CT CERVICAL SPINE W/O CM
3 series · 11 of 33 positions shown, 13 images · non-contrast
Comparison: 09/11/2016

CLINICAL DATA: Head trauma, minor (Age >= 65y); Neck trauma (Age >=
65y)



[Series 3: c spine soft · axial · 0.43mm/px · z∈[-295,-169]mm · 3 of 101 slices shown, 4 images]
[im 24/101  soft-tissue]
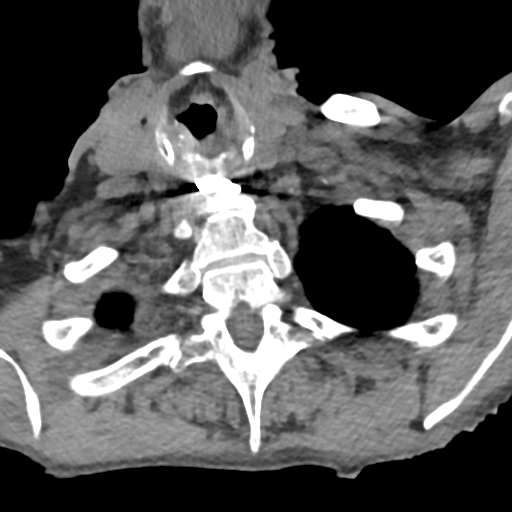
[im 24/101  bone]
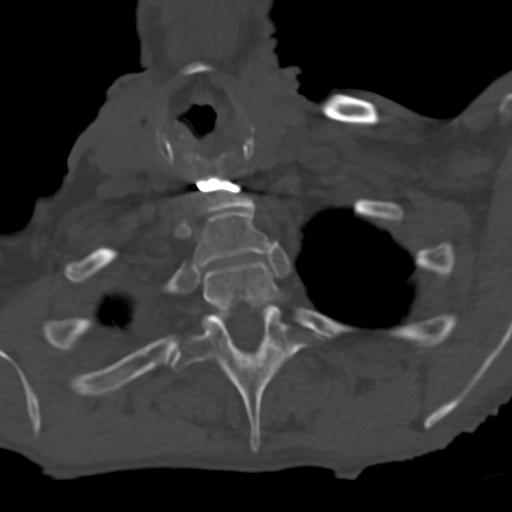
[im 54/101  bone]
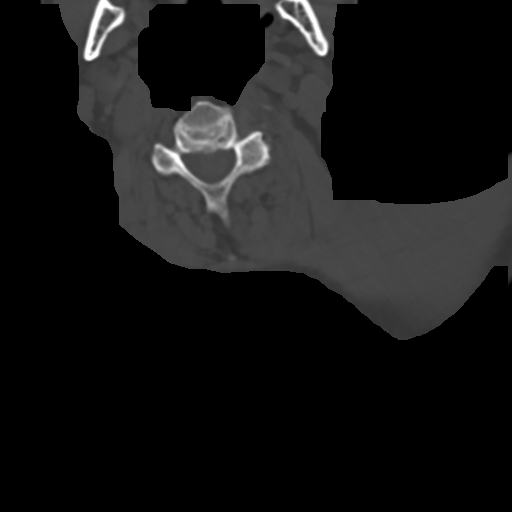
[im 85/101  bone]
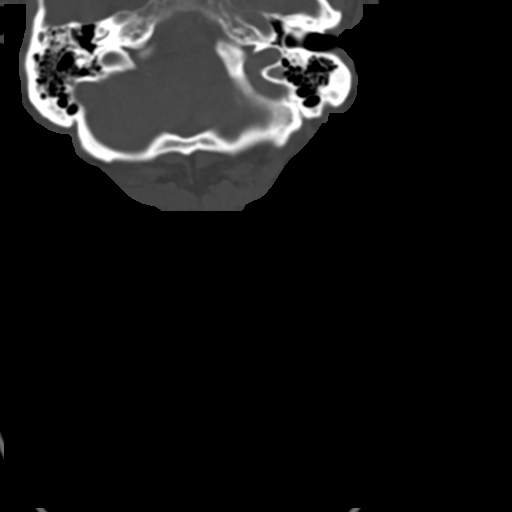

[Series 5: sag bone · sagittal · 0.35mm/px · 5 of 64 slices shown, 6 images]
[im 22/64  bone]
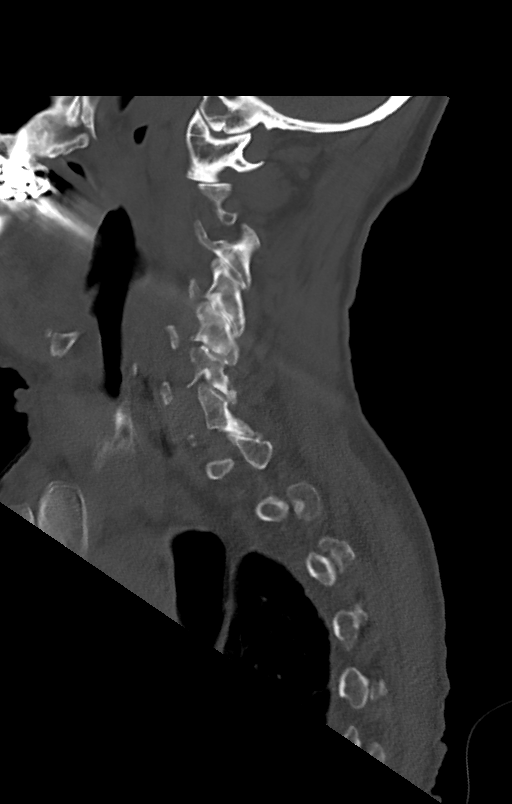
[im 27/64  bone]
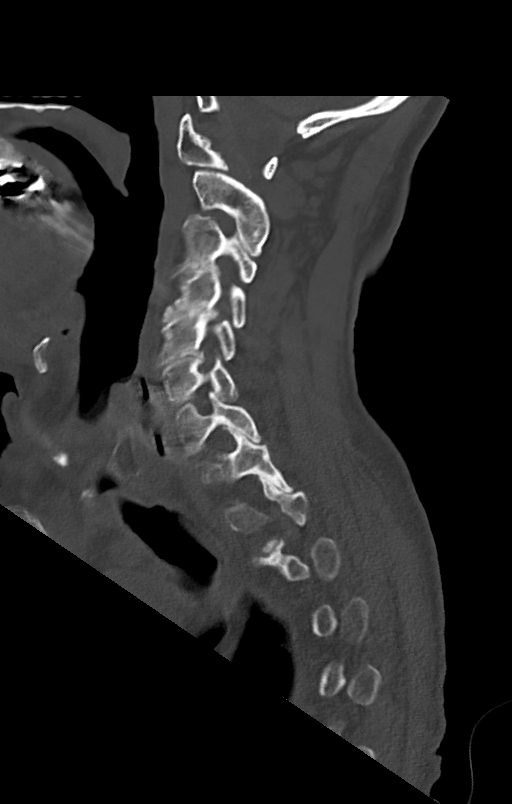
[im 32/64  soft-tissue]
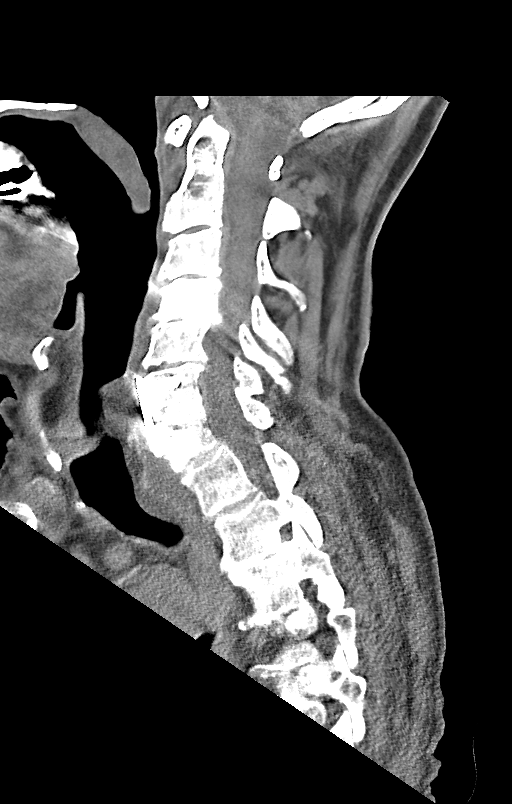
[im 32/64  bone]
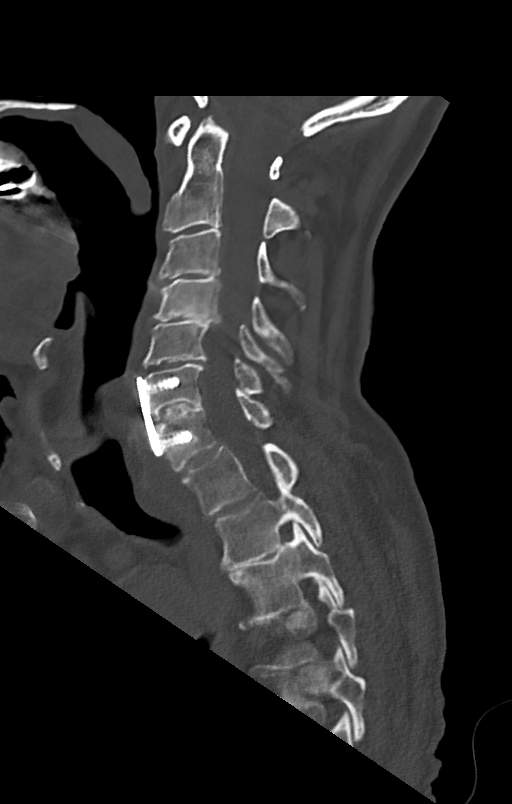
[im 37/64  bone]
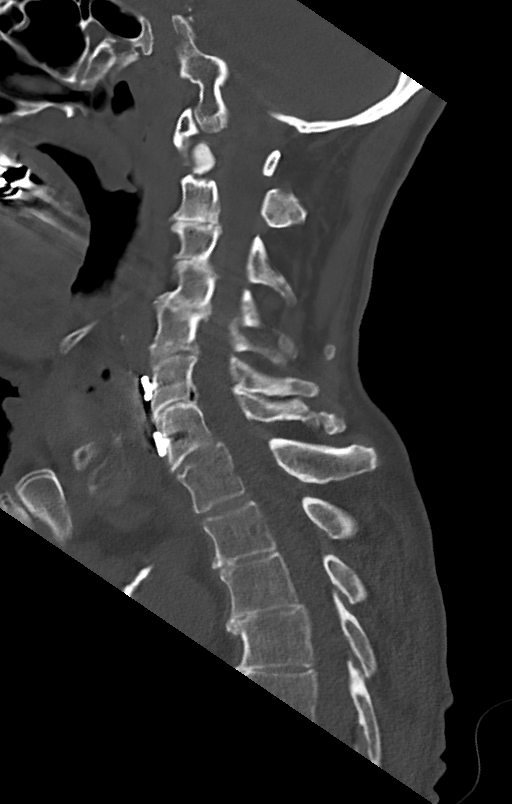
[im 43/64  bone]
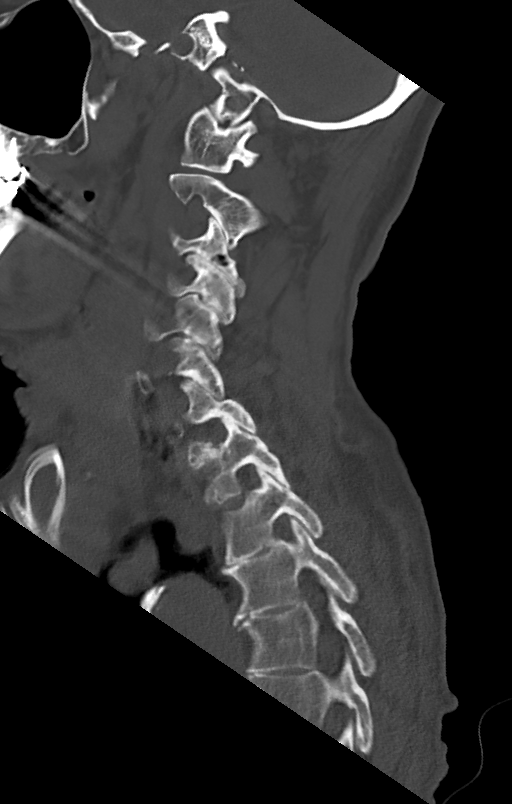

[Series 6: cor bone · coronal · 0.27mm/px · 3 of 79 slices shown]
[im 27/79  bone]
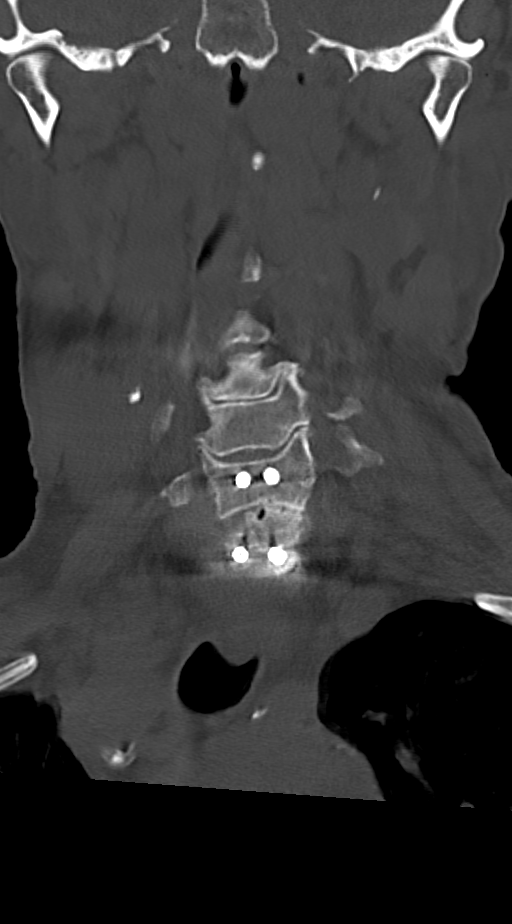
[im 35/79  bone]
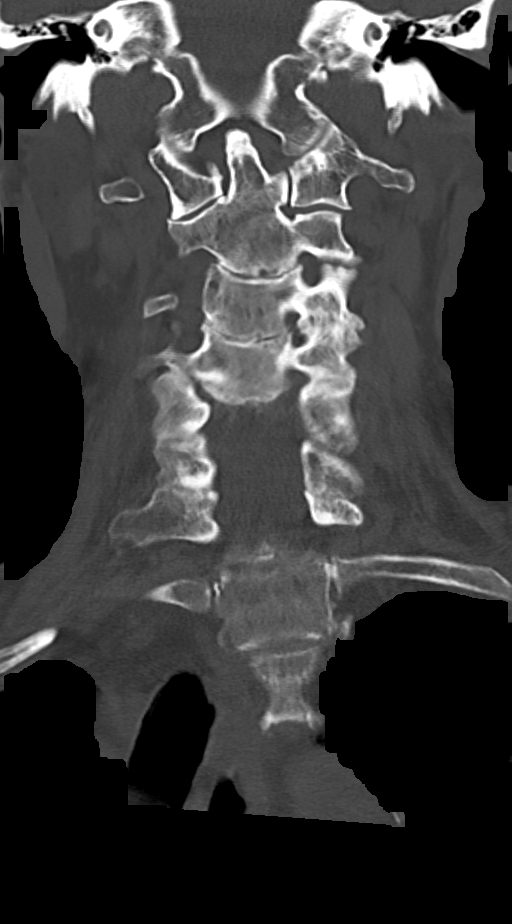
[im 44/79  bone]
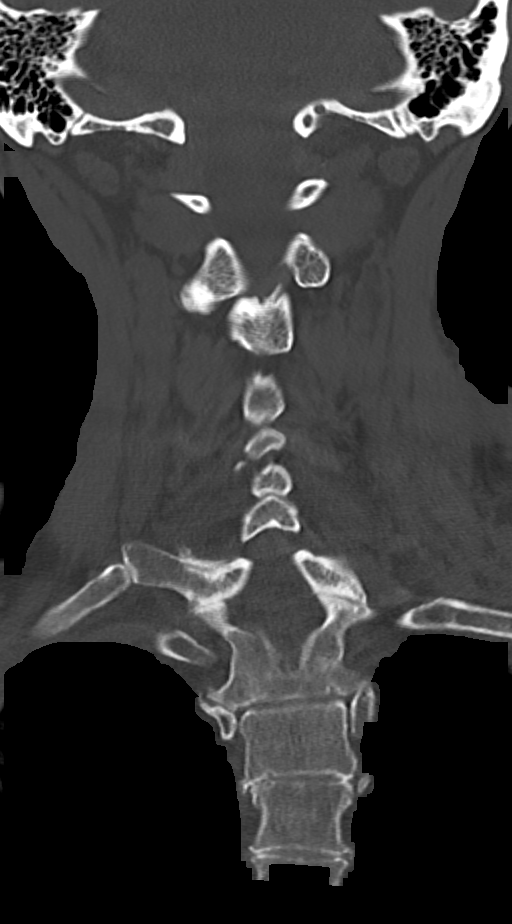

[11 of 33 positions shown; findings below may reference images not displayed]

FINDINGS: CT HEAD FINDINGS

Brain: No evidence of acute infarction, hemorrhage, hydrocephalus,
extra-axial collection or mass lesion/mass effect. Moderate
low-density changes within the periventricular and subcortical white
matter compatible with chronic microvascular ischemic change. Mild
diffuse cerebral volume loss.

Vascular: No hyperdense vessel or unexpected calcification.

Skull: Normal. Negative for fracture or focal lesion.

Sinuses/Orbits: No acute finding.

Other: Negative for scalp hematoma.

CT CERVICAL SPINE FINDINGS

Alignment: Facet joints are aligned without dislocation or traumatic
listhesis. Dens and lateral masses are aligned. Degenerative
retrolisthesis of C4 on C5 and C5 on C6.

Skull base and vertebrae: No acute fracture. No primary bone lesion
or focal pathologic process. Prior C6-7 ACDF.

Soft tissues and spinal canal: No prevertebral fluid or swelling. No
visible canal hematoma.

Disc levels: Advanced degenerative disc disease of C4-5 and C5-6.
Multilevel bilateral facet arthropathy.

Upper chest: Emphysematous changes within the included lung apices.
6 mm right apical pulmonary nodule (series 3, image 90).

Other: Aortic and bilateral carotid atherosclerosis.
IMPRESSION: 1. No acute intracranial abnormality.
2. No acute cervical spine fracture or traumatic listhesis.
3. Chronic microvascular ischemic change and mild diffuse cerebral
volume loss.
4. Prior C6-7 ACDF with advanced degenerative disc disease and facet
arthropathy of the cervical spine.
months is recommended. If the nodule is stable at time of repeat CT,
then future CT at 18-24 months (from today's scan) is considered
optional for low-risk patients, but is recommended for high-risk
patients. This recommendation follows the consensus statement:
Guidelines for Management of Incidental Pulmonary Nodules Detected

Aortic Atherosclerosis (8BDNV-IGO.O).
